# Patient Record
Sex: Female | Born: 1955 | Race: Black or African American | Hispanic: No | Smoking: Never smoker
Health system: Southern US, Community
[De-identification: ages and names within clinical notes are randomized; demographics above are authoritative.]

## PROBLEM LIST (undated history)

## (undated) DIAGNOSIS — E119 Type 2 diabetes mellitus without complications: Secondary | ICD-10-CM

## (undated) DIAGNOSIS — I10 Essential (primary) hypertension: Secondary | ICD-10-CM

## (undated) DIAGNOSIS — M1990 Unspecified osteoarthritis, unspecified site: Secondary | ICD-10-CM

## (undated) HISTORY — PX: TUBAL LIGATION: SHX77

## (undated) HISTORY — PX: UTERINE FIBROID SURGERY: SHX826

---

## 2010-04-20 ENCOUNTER — Ambulatory Visit: Payer: Self-pay | Admitting: Family Medicine

## 2010-04-20 IMAGING — CR DG KNEE COMPLETE 4+V*L*
1 series · 6 of 6 positions shown · non-contrast
Comparison: none

REASON FOR EXAM: arthritis
COMMENTS:

[Series 1: view not recorded · 0.17mm/px · 6 of 6 slices shown]
[im 1/6]
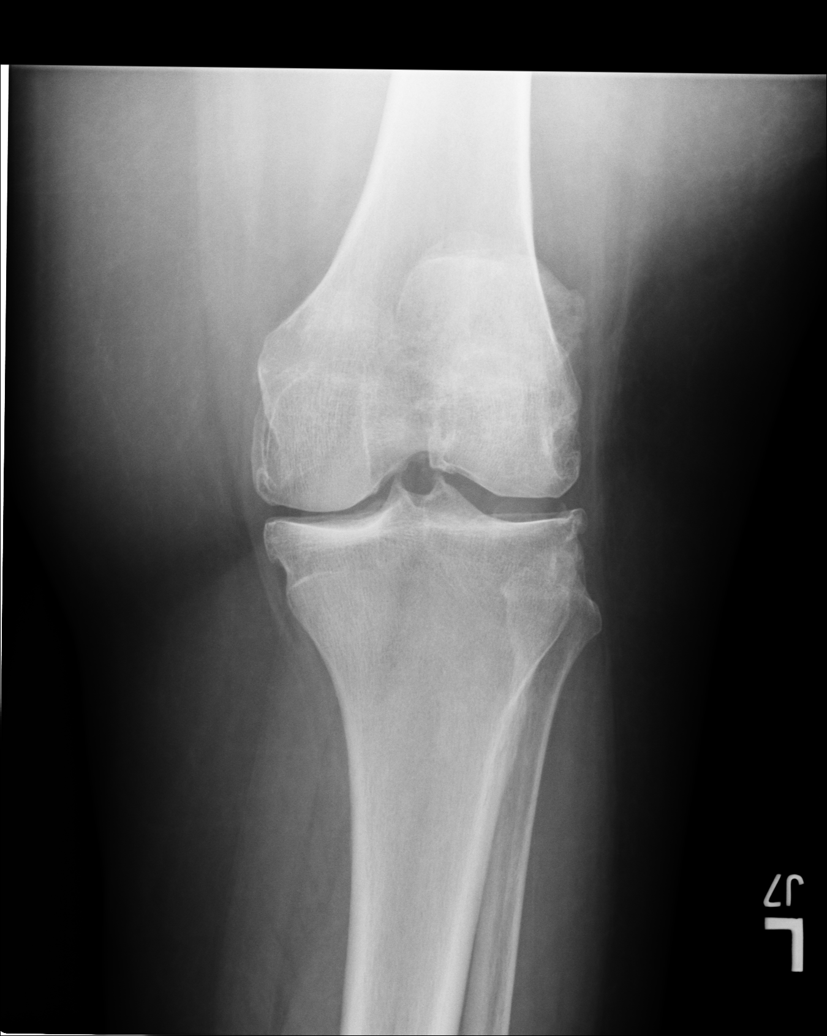
[im 2/6]
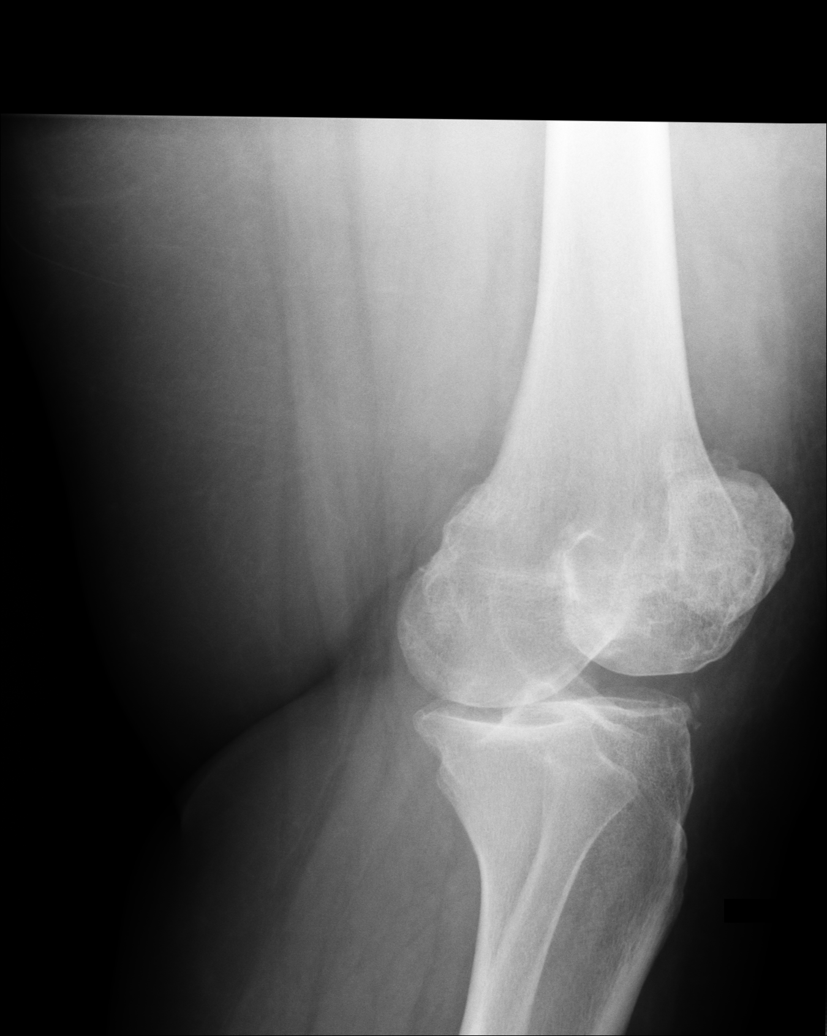
[im 3/6]
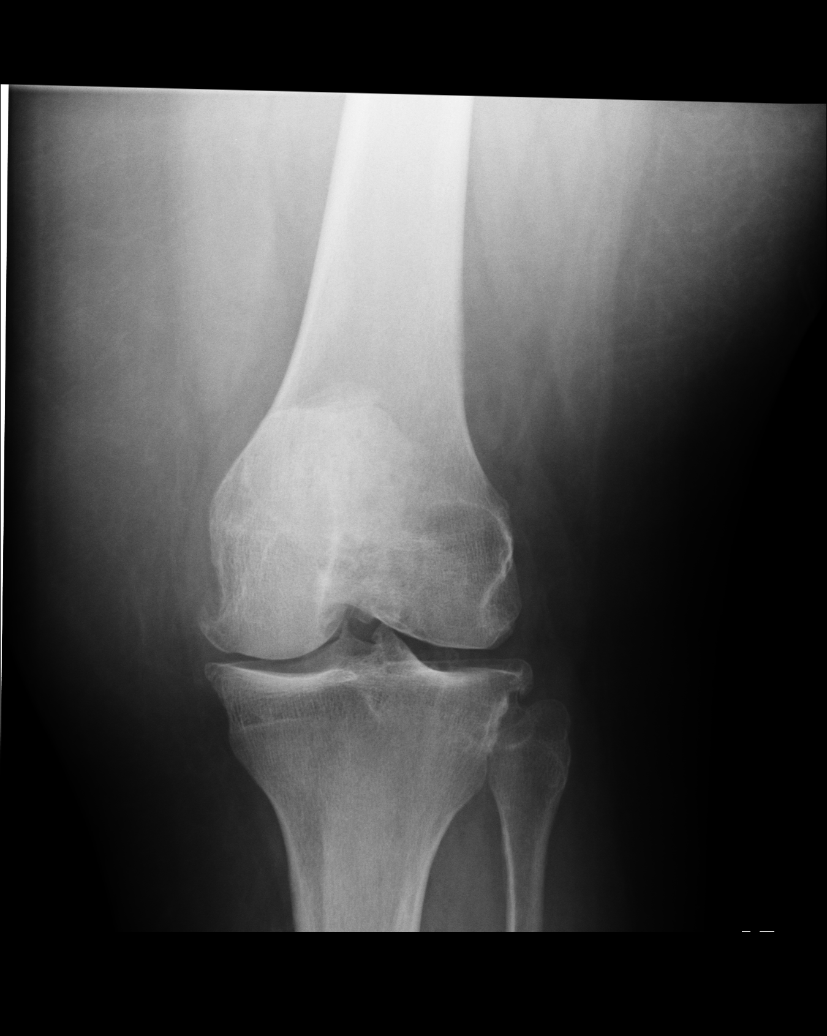
[im 4/6]
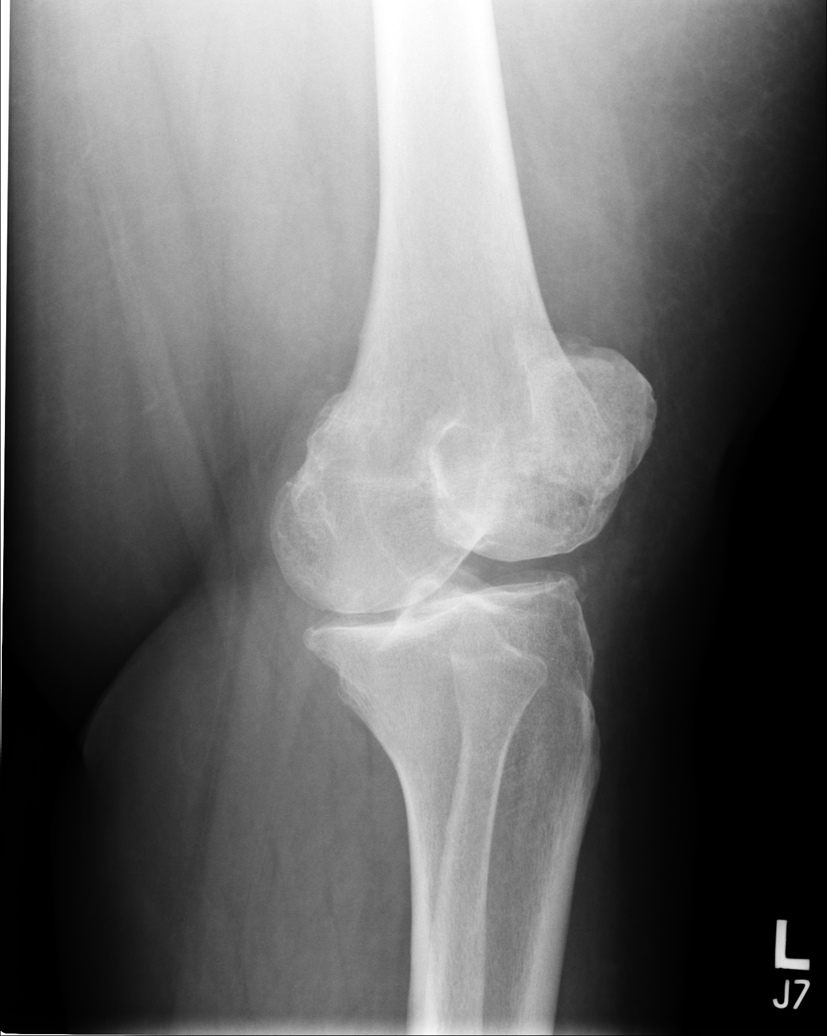
[im 5/6]
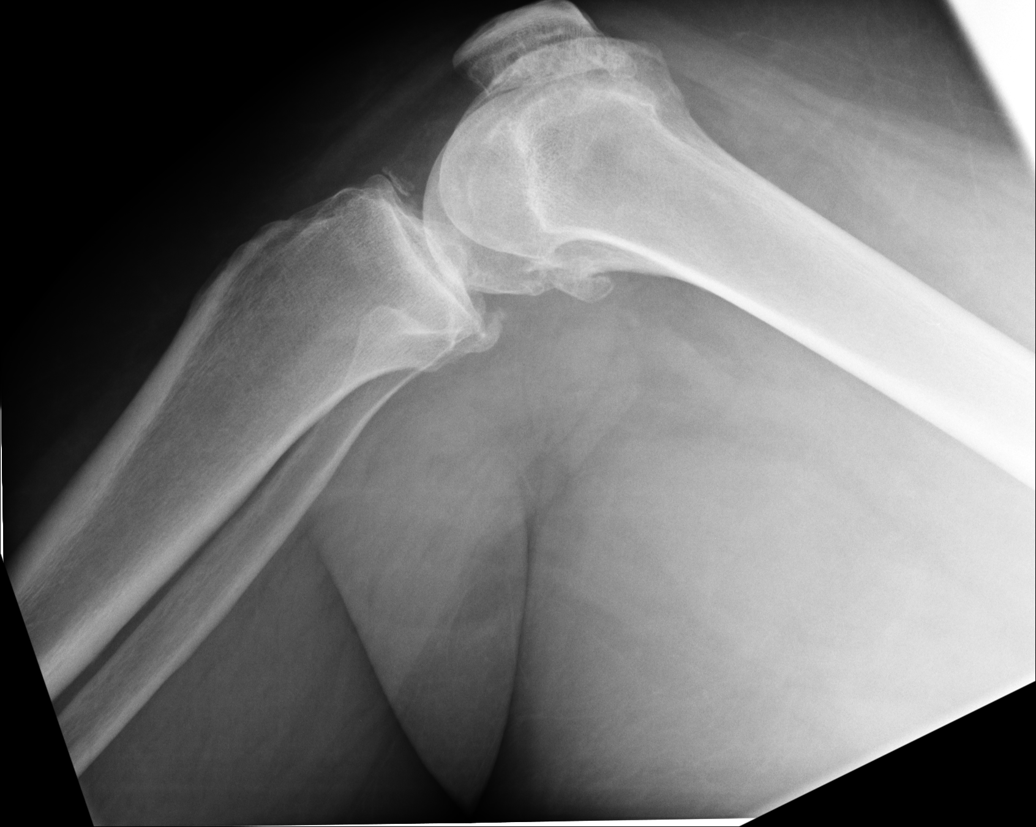
[im 6/6]
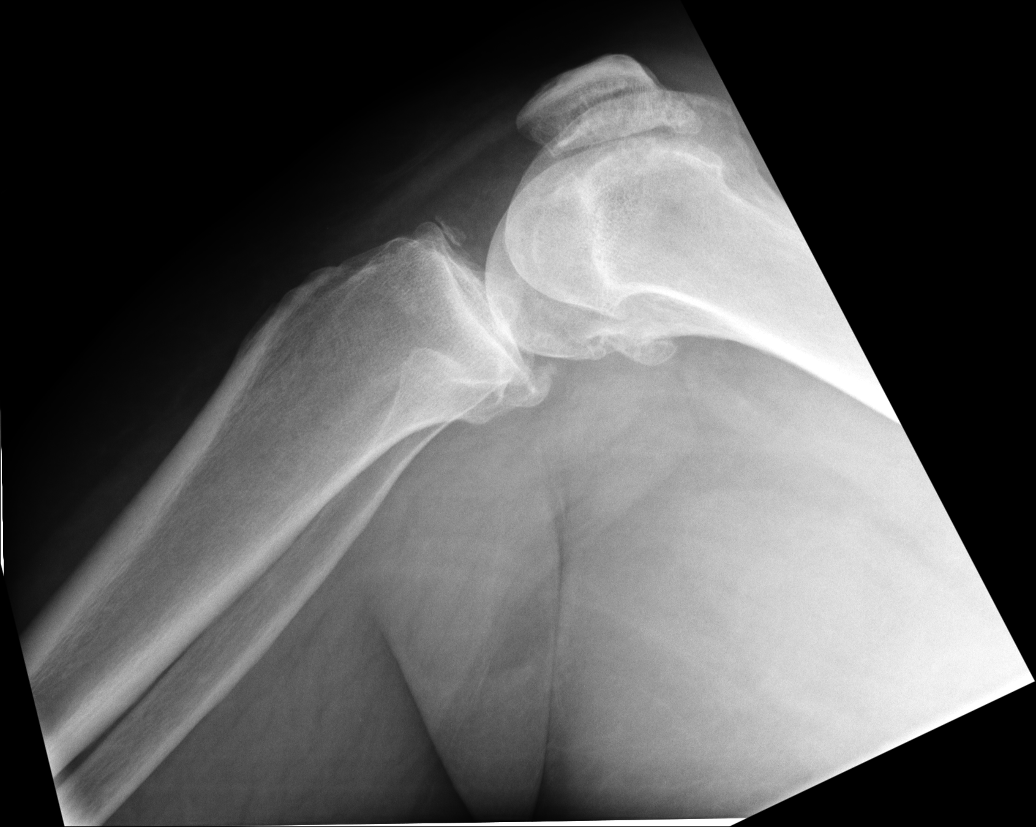

[6 of 6 positions shown; findings below may reference images not displayed]

PROCEDURE:     DXR - DXR KNEE LT COMP WITH OBLIQUES  - [DATE]  [DATE]

RESULT:     No fracture, dislocation or other acute bony abnormality is
seen. There is hypertrophic spurring about the knee compatible with
osteoarthritic change. The knee joint space is mildly narrowed medially,
also consistent with arthritic change. No cystic changes in the distal femur
or proximal tibia are identified. There is narrowing of the femoropatellar
joint space compatible with arthritic change. No fracture of the patella is
seen. No lytic or blastic changes about the knee are noted.
IMPRESSION: 1.  Arthritic changes are noted about the knee and femoropatellar joint as
noted above.
2.  No fracture is seen.
3.  No lytic or blastic lesions are noted.

## 2010-04-20 IMAGING — CR DG CHEST 2V
1 series · 2 of 2 positions shown · non-contrast
Comparison: none

REASON FOR EXAM: cough
COMMENTS:

[Series 1: view not recorded · 0.17mm/px · 2 of 2 slices shown]
[im 1/2]
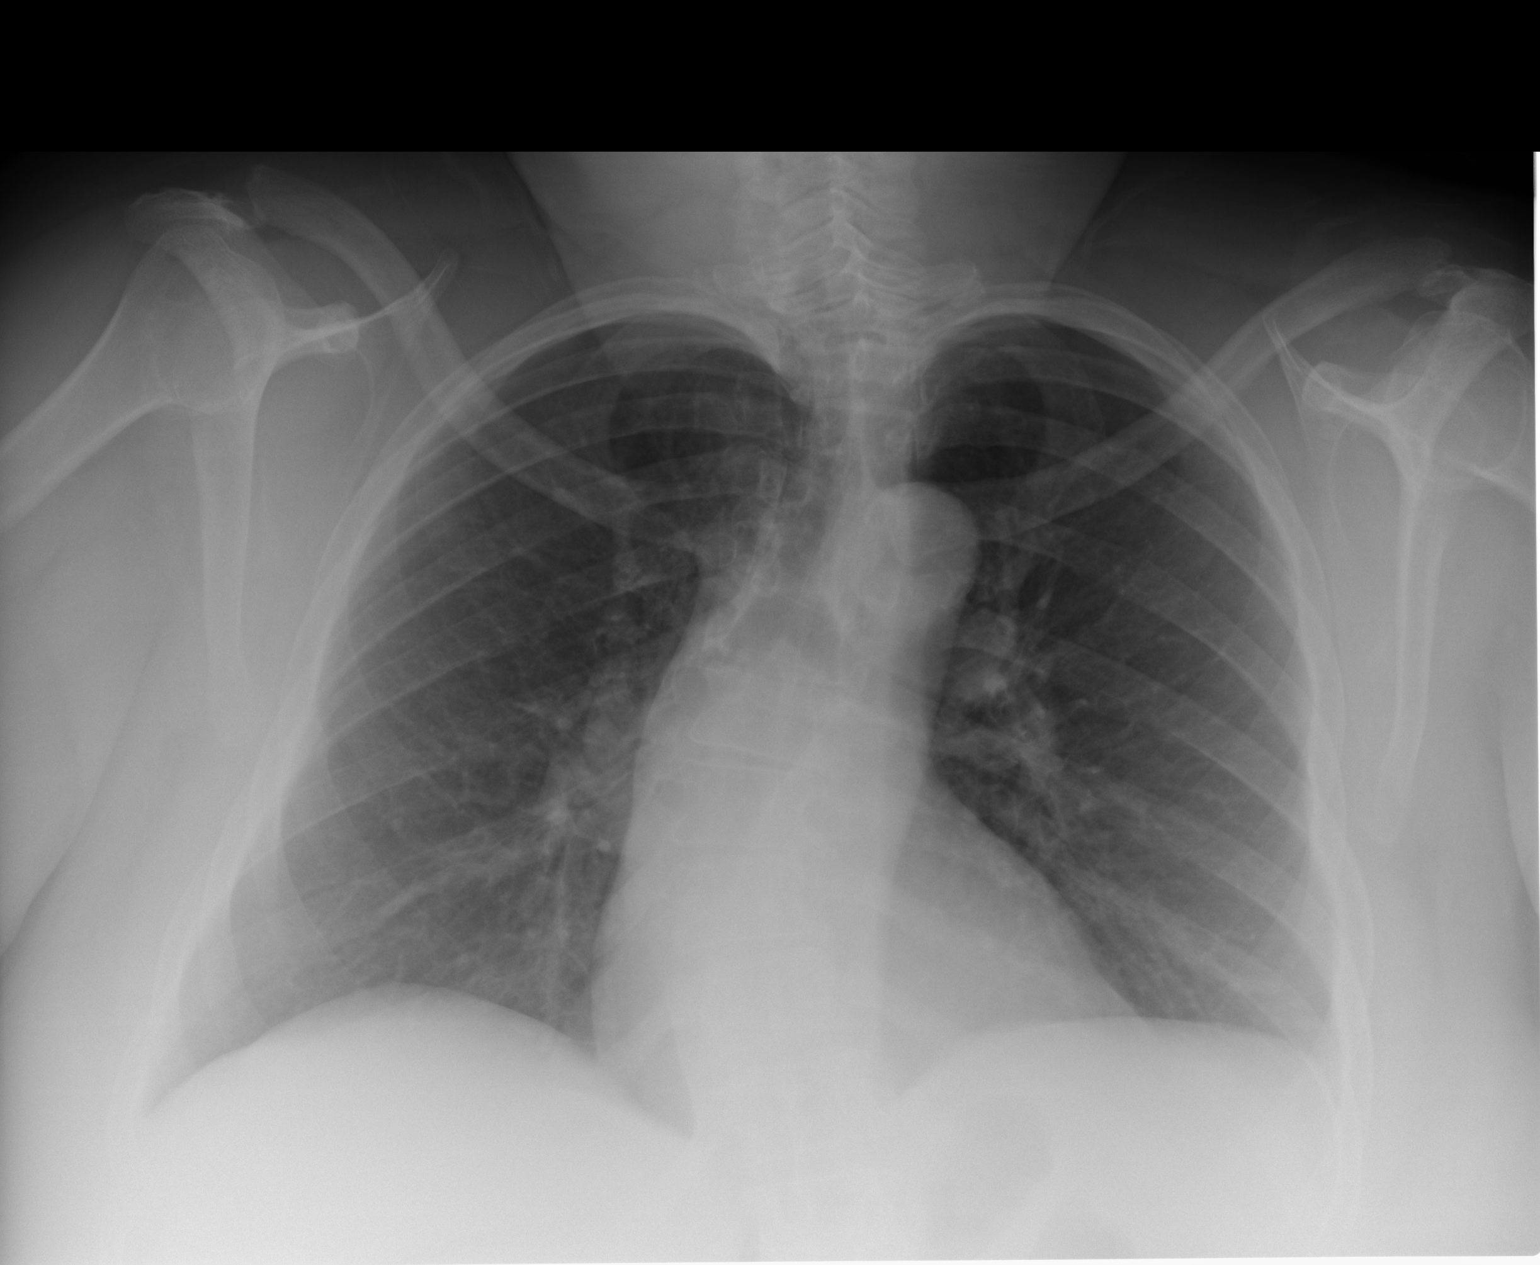
[im 2/2]
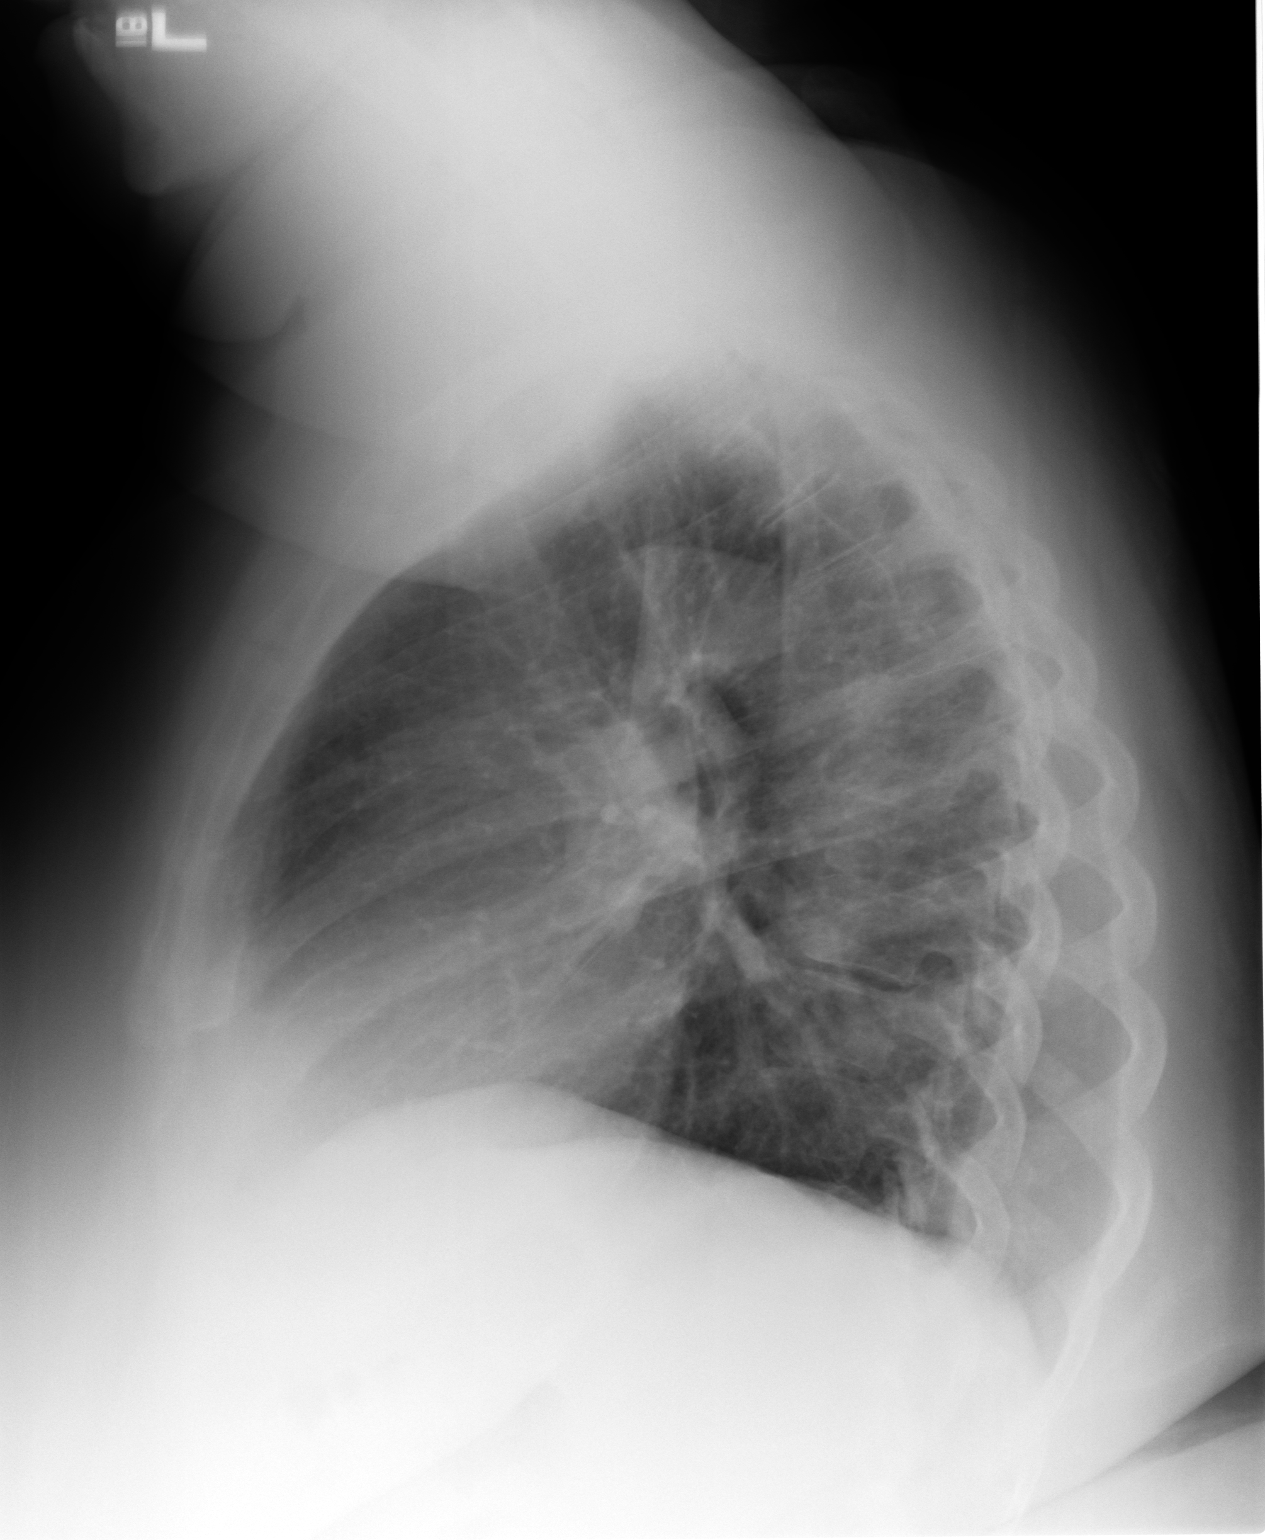

[2 of 2 positions shown; findings below may reference images not displayed]

PROCEDURE:     DXR - DXR CHEST PA (OR AP) AND LATERAL  - [DATE]  [DATE]

RESULT:     The lung fields are clear. The heart size is normal. The
pulmonary vasculature likewise is normal in appearance. There is a mild
thoracic scoliosis with convexly to the right. The osseous structures
otherwise are normal in appearance.
IMPRESSION: 1.  No acute changes are identified.
2.  The lung fields are clear.
3.  A thoracolumbar scoliosis is noted with the convexity being to the right
and measuring approximately 29 degrees.

## 2010-12-22 ENCOUNTER — Emergency Department: Payer: Self-pay | Admitting: Unknown Physician Specialty

## 2010-12-22 IMAGING — CR DG CHEST 1V PORT
1 series · 1 of 1 positions shown · non-contrast
Comparison: none

REASON FOR EXAM: cp palpitations
COMMENTS:

PROCEDURE:     DXR - DXR PORTABLE CHEST SINGLE VIEW  - [DATE]  [DATE]
RESULT:     Comparison is made to a prior exam of [DATE]. The lung fields
are clear. The heart, mediastinal and osseous structures show no significant
abnormalities. Monitoring electrodes are present.

[view not recorded]
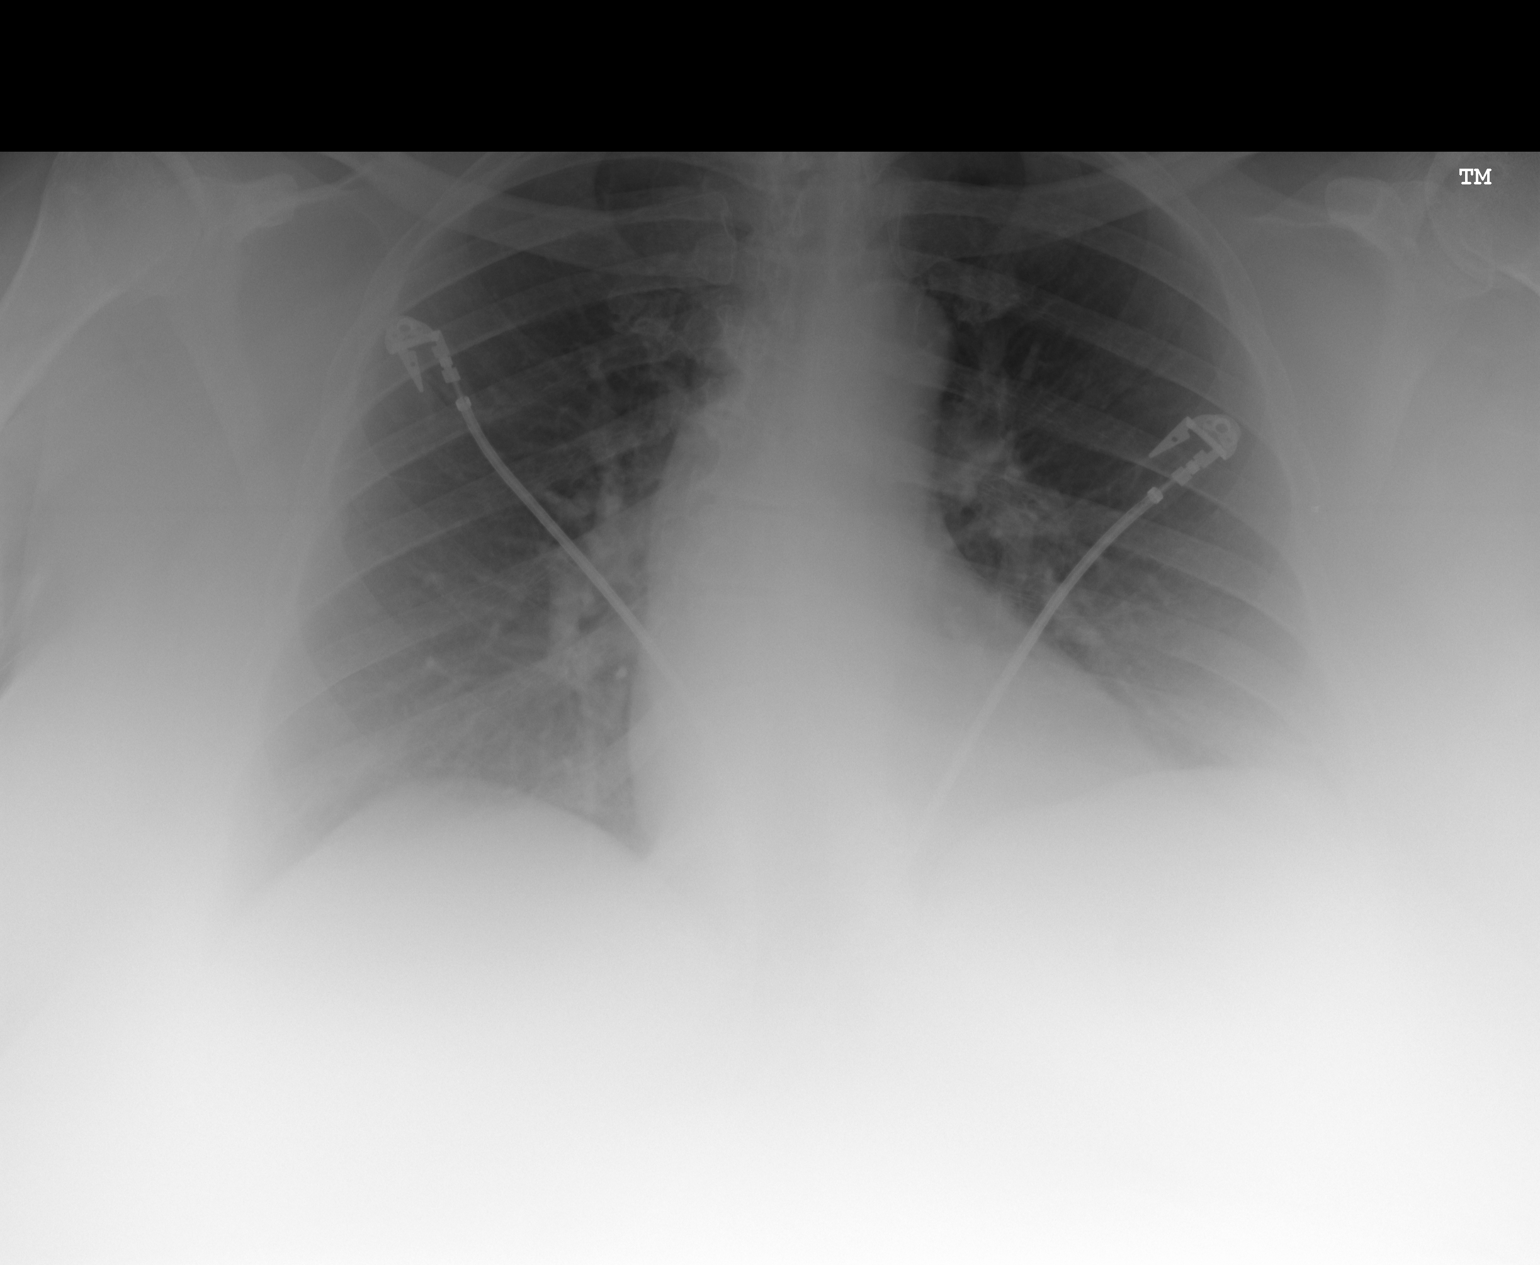

[1 of 1 positions shown; findings below may reference images not displayed]

IMPRESSION: 1.     No acute changes are identified.

## 2011-10-27 ENCOUNTER — Emergency Department: Payer: Self-pay | Admitting: Emergency Medicine

## 2016-03-07 ENCOUNTER — Ambulatory Visit: Payer: Self-pay

## 2016-03-21 ENCOUNTER — Ambulatory Visit: Payer: Self-pay | Attending: Internal Medicine

## 2016-08-01 ENCOUNTER — Emergency Department
Admission: EM | Admit: 2016-08-01 | Discharge: 2016-08-01 | Disposition: A | Discharge status: Home / Self Care | Payer: Medicaid Other | Attending: Emergency Medicine | Admitting: Emergency Medicine

## 2016-08-01 ENCOUNTER — Encounter: Payer: Self-pay | Admitting: Emergency Medicine

## 2016-08-01 DIAGNOSIS — T783XXA Angioneurotic edema, initial encounter: Secondary | ICD-10-CM | POA: Insufficient documentation

## 2016-08-01 DIAGNOSIS — E119 Type 2 diabetes mellitus without complications: Secondary | ICD-10-CM | POA: Diagnosis not present

## 2016-08-01 DIAGNOSIS — I10 Essential (primary) hypertension: Secondary | ICD-10-CM | POA: Diagnosis not present

## 2016-08-01 DIAGNOSIS — R220 Localized swelling, mass and lump, head: Secondary | ICD-10-CM | POA: Diagnosis present

## 2016-08-01 HISTORY — DX: Type 2 diabetes mellitus without complications: E11.9

## 2016-08-01 HISTORY — DX: Essential (primary) hypertension: I10

## 2016-08-01 LAB — CBC WITH DIFFERENTIAL/PLATELET
BASOS PCT: 1 %
Basophils Absolute: 0.1 10*3/uL (ref 0–0.1)
Eosinophils Absolute: 0.4 10*3/uL (ref 0–0.7)
Eosinophils Relative: 3 %
HEMATOCRIT: 39.6 % (ref 35.0–47.0)
HEMOGLOBIN: 12.9 g/dL (ref 12.0–16.0)
LYMPHS ABS: 2.7 10*3/uL (ref 1.0–3.6)
Lymphocytes Relative: 23 %
MCH: 25.7 pg — AB (ref 26.0–34.0)
MCHC: 32.7 g/dL (ref 32.0–36.0)
MCV: 78.4 fL — AB (ref 80.0–100.0)
MONO ABS: 1 10*3/uL — AB (ref 0.2–0.9)
MONOS PCT: 9 %
NEUTROS ABS: 7.5 10*3/uL — AB (ref 1.4–6.5)
Neutrophils Relative %: 64 %
Platelets: 264 10*3/uL (ref 150–440)
RBC: 5.04 MIL/uL (ref 3.80–5.20)
RDW: 15.3 % — AB (ref 11.5–14.5)
WBC: 11.7 10*3/uL — ABNORMAL HIGH (ref 3.6–11.0)

## 2016-08-01 LAB — COMPREHENSIVE METABOLIC PANEL
ALBUMIN: 3.6 g/dL (ref 3.5–5.0)
ALK PHOS: 96 U/L (ref 38–126)
ALT: 26 U/L (ref 14–54)
ANION GAP: 8 (ref 5–15)
AST: 28 U/L (ref 15–41)
BILIRUBIN TOTAL: 0.7 mg/dL (ref 0.3–1.2)
BUN: 20 mg/dL (ref 6–20)
CALCIUM: 9.4 mg/dL (ref 8.9–10.3)
CO2: 25 mmol/L (ref 22–32)
Chloride: 105 mmol/L (ref 101–111)
Creatinine, Ser: 1.12 mg/dL — ABNORMAL HIGH (ref 0.44–1.00)
GFR, EST NON AFRICAN AMERICAN: 52 mL/min — AB (ref >60)
Glucose, Bld: 129 mg/dL — ABNORMAL HIGH (ref 65–99)
POTASSIUM: 4.6 mmol/L (ref 3.5–5.1)
Sodium: 138 mmol/L (ref 135–145)
TOTAL PROTEIN: 7.9 g/dL (ref 6.5–8.1)

## 2016-08-01 MED ORDER — FAMOTIDINE IN NACL 20-0.9 MG/50ML-% IV SOLN
20.0000 mg | Freq: Once | INTRAVENOUS | Status: AC
  Administered 2016-08-01: 20 mg via INTRAVENOUS
  Filled 2016-08-01: qty 50

## 2016-08-01 MED ORDER — PREDNISONE 10 MG (21) PO TBPK: ORAL_TABLET | ORAL | 0 refills | Status: DC

## 2016-08-01 MED ORDER — METHYLPREDNISOLONE SODIUM SUCC 125 MG IJ SOLR
125.0000 mg | Freq: Once | INTRAMUSCULAR | Status: AC
  Administered 2016-08-01: 125 mg via INTRAVENOUS
  Filled 2016-08-01: qty 2

## 2016-08-01 MED ORDER — DIPHENHYDRAMINE HCL 25 MG PO CAPS: 25.0000 mg | ORAL_CAPSULE | ORAL | 2 refills | Status: DC | PRN

## 2016-08-01 MED ORDER — DIPHENHYDRAMINE HCL 50 MG/ML IJ SOLN
50.0000 mg | Freq: Once | INTRAMUSCULAR | Status: AC
  Administered 2016-08-01: 50 mg via INTRAVENOUS
  Filled 2016-08-01: qty 1

## 2016-08-01 NOTE — ED Provider Notes (Signed)
[facility] Emergency Department Provider Note        Time seen: ----------------------------------------- 9:57 AM on 08/01/2016 -----------------------------------------    I have reviewed the triage vital signs and the nursing notes.   HISTORY  Chief Complaint Allergic Reaction    HPI Chelsea Glass is a 60 y.o. female who presents to the ER for tongue swelling. Patient states she woke up this morning and noticed her tongue was swollen on the left side and it has progressed slightly. Patient states she's having some trouble talking but not swallowing at this time. She denies any changes in her medicines. She denies recent illness or other complaints. She's never had this problem before.   Past Medical History:  Diagnosis Date  . Diabetes mellitus without complication (HCC)   . Hypertension     There are no active problems to display for this patient.   Past Surgical History:  Procedure Laterality Date  . UTERINE FIBROID SURGERY      Allergies Review of patient's allergies indicates no known allergies.  Social History Social History  Substance Use Topics  . Smoking status: Not on file  . Smokeless tobacco: Not on file  . Alcohol use Not on file    Review of Systems Constitutional: Negative for fever. ENT: Positive for tongue swelling Cardiovascular: Negative for chest pain. Respiratory: Negative for shortness of breath. Gastrointestinal: Negative for abdominal pain, vomiting and diarrhea. Genitourinary: Negative for dysuria. Musculoskeletal: Negative for back pain. Skin: Negative for rash. Neurological: Negative for headaches, focal weakness or numbness.  10-point ROS otherwise negative.  ____________________________________________   PHYSICAL EXAM:  VITAL SIGNS: ED Triage Vitals  Enc Vitals Group     BP 08/01/16 0938 (!) 159/95     Pulse Rate 08/01/16 0938 100     Resp 08/01/16 0938 18     Temp 08/01/16 0938 98 F  (36.7 C)     Temp Source 08/01/16 0938 Oral     SpO2 08/01/16 0938 98 %     Weight 08/01/16 0939 (!) 323 lb (146.5 kg)     Height 08/01/16 0939 5\' 6"  (1.676 m)     Head Circumference --      Peak Flow --      Pain Score --      Pain Loc --      Pain Edu? --      Excl. in GC? --     Constitutional: Alert and oriented. Well appearing and in no distress. Eyes: Conjunctivae are normal. PERRL. Normal extraocular movements. ENT   Head: Normocephalic and atraumatic.   Nose: No congestion/rhinnorhea.   Mouth/Throat: Mucous membranes are moist.Mild to moderate tongue swelling is noted, particularly inferiorly. No posterior swelling or posterior pharyngeal edema   Neck: No stridor. Cardiovascular: Normal rate, regular rhythm. No murmurs, rubs, or gallops. Respiratory: Normal respiratory effort without tachypnea nor retractions. Breath sounds are clear and equal bilaterally. No wheezes/rales/rhonchi. Musculoskeletal: Nontender with normal range of motion in all extremities. No lower extremity tenderness nor edema. Neurologic:  Normal speech and language. No gross focal neurologic deficits are appreciated.  Skin:  Skin is warm, dry and intact. No rash noted. Psychiatric: Mood and affect are normal. Speech and behavior are normal.  ____________________________________________  ED COURSE:  Pertinent labs & imaging results that were available during my care of the patient were reviewed by me and considered in my medical decision making (see chart for details). Clinical Course  Patient presents to the ER with likely angioedema. Patient  reports she takes lisinopril which is likely the culprit. We will give IV steroids and antihistamines and observed.  Procedures ____________________________________________   LABS (pertinent positives/negatives)  Labs Reviewed  CBC WITH DIFFERENTIAL/PLATELET - Abnormal; Notable for the following:       Result Value   WBC 11.7 (*)    MCV 78.4 (*)     MCH 25.7 (*)    RDW 15.3 (*)    Neutro Abs 7.5 (*)    Monocytes Absolute 1.0 (*)    All other components within normal limits  COMPREHENSIVE METABOLIC PANEL - Abnormal; Notable for the following:    Glucose, Bld 129 (*)    Creatinine, Ser 1.12 (*)    GFR calc non Af Amer 52 (*)    All other components within normal limits  ____________________________________________  FINAL ASSESSMENT AND PLAN  Angioedema  Plan: Patient with labs as dictated above. Patient has been observed in the ER for several hours without worsening of her symptoms. Her tongue is feeling better. I've advised her to stop her ACE inhibitor or ARB and have close outpatient follow-up with her doctor for recheck.   Emily FilbertWilliams, Annaston Upham E, MD   Note: This dictation was prepared with Dragon dictation. Any transcriptional errors that result from this process are unintentional    Emily FilbertJonathan E Yohann Curl, MD 08/01/16 1243

## 2016-08-01 NOTE — ED Triage Notes (Signed)
Pt arrived via EMS from home for reports of swollen tongue. No respiratory distress noted. Pt denies any new medications. EMS reports 164/100, 98%, NSR, CBG 145.

## 2016-09-22 ENCOUNTER — Emergency Department
Admission: EM | Admit: 2016-09-22 | Discharge: 2016-09-22 | Disposition: A | Discharge status: Home / Self Care | Payer: Medicaid Other | Attending: Emergency Medicine | Admitting: Emergency Medicine

## 2016-09-22 DIAGNOSIS — E119 Type 2 diabetes mellitus without complications: Secondary | ICD-10-CM | POA: Diagnosis not present

## 2016-09-22 DIAGNOSIS — R0600 Dyspnea, unspecified: Secondary | ICD-10-CM | POA: Diagnosis present

## 2016-09-22 DIAGNOSIS — T783XXA Angioneurotic edema, initial encounter: Secondary | ICD-10-CM | POA: Diagnosis not present

## 2016-09-22 DIAGNOSIS — Z79899 Other long term (current) drug therapy: Secondary | ICD-10-CM | POA: Insufficient documentation

## 2016-09-22 DIAGNOSIS — I10 Essential (primary) hypertension: Secondary | ICD-10-CM | POA: Insufficient documentation

## 2016-09-22 HISTORY — DX: Unspecified osteoarthritis, unspecified site: M19.90

## 2016-09-22 MED ORDER — FAMOTIDINE IN NACL 20-0.9 MG/50ML-% IV SOLN
20.0000 mg | Freq: Once | INTRAVENOUS | Status: AC
  Administered 2016-09-22: 20 mg via INTRAVENOUS

## 2016-09-22 MED ORDER — CARVEDILOL 12.5 MG PO TABS: 12.5000 mg | ORAL_TABLET | Freq: Two times a day (BID) | ORAL | 0 refills | Status: DC

## 2016-09-22 MED ORDER — METHYLPREDNISOLONE SODIUM SUCC 125 MG IJ SOLR
INTRAMUSCULAR | Status: AC
  Filled 2016-09-22: qty 2

## 2016-09-22 MED ORDER — DIPHENHYDRAMINE HCL 50 MG/ML IJ SOLN
50.0000 mg | Freq: Once | INTRAMUSCULAR | Status: AC
  Administered 2016-09-22: 50 mg via INTRAVENOUS

## 2016-09-22 MED ORDER — DIPHENHYDRAMINE HCL 50 MG/ML IJ SOLN
INTRAMUSCULAR | Status: AC
  Filled 2016-09-22: qty 1

## 2016-09-22 MED ORDER — METHYLPREDNISOLONE SODIUM SUCC 125 MG IJ SOLR
125.0000 mg | Freq: Once | INTRAMUSCULAR | Status: AC
  Administered 2016-09-22: 125 mg via INTRAVENOUS

## 2016-09-22 NOTE — ED Triage Notes (Signed)
Pt taken to rm 12. Reports 1 hour of swelling of her tongue and shortness of breath.

## 2016-09-22 NOTE — ED Notes (Addendum)
Pt ambulatory to stat desk, appears SOB, reports tongue swelling and SOB, states it has happened before caused by lisinopril but she was taken off lisinopril so unsure of cause.  Pt taken directly to rm 12.  Primary nurse informed

## 2016-09-22 NOTE — ED Notes (Signed)
Reviewed d/c instructions, follow-up care and prescription with patient. Pt verbalized understanding.  

## 2016-09-22 NOTE — ED Notes (Signed)
Patient reports decrease in SOB

## 2016-09-22 NOTE — ED Provider Notes (Signed)
[facility] Emergency Department Provider Note   First MD Initiated Contact with Patient 09/22/16 0206     (approximate)  I have reviewed the triage vital signs and the nursing notes.   HISTORY  Chief Complaint Oral Swelling    HPI Chelsea Glass is a 60 y.o. female with bolus of chronic medical conditions presents to the emergency department with tongue swelling and dyspnea with onset 1 hour before presentation. Patient states that this has occurred once before and was attributed to ACE inhibitor (lisinopril) which was discontinued and losartan was started. Patient denies any chest pain.   Past Medical History:  Diagnosis Date  . Arthritis   . Diabetes mellitus without complication (HCC)   . Hypertension     There are no active problems to display for this patient.   Past Surgical History:  Procedure Laterality Date  . UTERINE FIBROID SURGERY      Prior to Admission medications   Medication Sig Start Date End Date Taking? Authorizing Provider  diphenhydrAMINE (BENADRYL) 25 mg capsule Take 1 capsule (25 mg total) by mouth every 4 (four) hours as needed. 08/01/16 08/01/17  Emily FilbertJonathan E Williams, MD  predniSONE (STERAPRED UNI-PAK 21 TAB) 10 MG (21) TBPK tablet Dispense steroid taper pack as directed 08/01/16   Emily FilbertJonathan E Williams, MD    Allergies Lisinopril  No family history on file.  Social History Social History  Substance Use Topics  . Smoking status: Not on file  . Smokeless tobacco: Not on file  . Alcohol use Not on file    Review of Systems Constitutional: No fever/chills Eyes: No visual changes. ENT: No sore throat.Positive for tongue swelling Cardiovascular: Denies chest pain. Respiratory: Denies shortness of breath. Gastrointestinal: No abdominal pain.  No nausea, no vomiting.  No diarrhea.  No constipation. Genitourinary: Negative for dysuria. Musculoskeletal: Negative for back pain. Skin: Negative for  rash. Neurological: Negative for headaches, focal weakness or numbness.  10-point ROS otherwise negative.  ____________________________________________   PHYSICAL EXAM:  VITAL SIGNS: ED Triage Vitals  Enc Vitals Group     BP 09/22/16 0208 (!) 179/114     Pulse Rate 09/22/16 0212 (!) 102     Resp 09/22/16 0212 18     Temp 09/22/16 0212 98.3 F (36.8 C)     Temp Source 09/22/16 0212 Oral     SpO2 09/22/16 0212 100 %     Weight 09/22/16 0208 (!) 325 lb (147.4 kg)     Height 09/22/16 0208 5\' 6"  (1.676 m)     Head Circumference --      Peak Flow --      Pain Score 09/22/16 0209 8     Pain Loc --      Pain Edu? --      Excl. in GC? --     Constitutional: Alert and oriented. Well appearing and in no acute distress. Eyes: Conjunctivae are normal. PERRL. EOMI. Head: Atraumatic. Ears:  Healthy appearing ear canals and TMs bilaterally Nose: No congestion/rhinnorhea. Mouth/Throat: Mucous membranes are moist.  Oropharynx non-erythematous.Mallampati 1. No appreciable tongue swelling Neck: No stridor.   Cardiovascular: Normal rate, regular rhythm. Good peripheral circulation. Grossly normal heart sounds. Respiratory: Normal respiratory effort.  No retractions. Lungs CTAB. Gastrointestinal: Soft and nontender. No distention.  Musculoskeletal: No lower extremity tenderness nor edema. No gross deformities of extremities. Neurologic:  Normal speech and language. No gross focal neurologic deficits are appreciated.  Skin:  Skin is warm, dry and intact. No rash noted.  Psychiatric: Mood and affect are normal. Speech and behavior are normal.*    Procedures    INITIAL IMPRESSION / ASSESSMENT AND PLAN / ED COURSE  Pertinent labs & imaging results that were available during my care of the patient were reviewed by me and considered in my medical decision making (see chart for details).  Patient given cimetidine 125 Pepcid 20 mg and Benadryl 50 mg with resolution of symptoms. Patient  advised to discontinue taking losartan HCTZ. Patient prescribe Coreg and advised to follow-up with primary care provider.    Clinical Course     ____________________________________________  FINAL CLINICAL IMPRESSION(S) / ED DIAGNOSES  Final diagnoses:  Angioedema, initial encounter     MEDICATIONS GIVEN DURING THIS VISIT:  Medications  famotidine (PEPCID) IVPB 20 mg premix (0 mg Intravenous Stopped 09/22/16 0247)  methylPREDNISolone sodium succinate (SOLU-MEDROL) 125 mg/2 mL injection 125 mg (125 mg Intravenous Given 09/22/16 0216)  diphenhydrAMINE (BENADRYL) injection 50 mg (50 mg Intravenous Given 09/22/16 0216)     NEW OUTPATIENT MEDICATIONS STARTED DURING THIS VISIT:  New Prescriptions   No medications on file    Modified Medications   No medications on file    Discontinued Medications   No medications on file     Note:  This document was prepared using Dragon voice recognition software and may include unintentional dictation errors.    Darci Currentandolph N Amit Meloy, MD 09/22/16 [phone]

## 2017-02-22 ENCOUNTER — Emergency Department
Admission: EM | Admit: 2017-02-22 | Discharge: 2017-02-22 | Disposition: A | Discharge status: Home / Self Care | Payer: Medicaid Other | Attending: Emergency Medicine | Admitting: Emergency Medicine

## 2017-02-22 ENCOUNTER — Emergency Department: Payer: Medicaid Other

## 2017-02-22 ENCOUNTER — Encounter: Payer: Self-pay | Admitting: Emergency Medicine

## 2017-02-22 DIAGNOSIS — R42 Dizziness and giddiness: Secondary | ICD-10-CM | POA: Diagnosis not present

## 2017-02-22 DIAGNOSIS — E119 Type 2 diabetes mellitus without complications: Secondary | ICD-10-CM | POA: Diagnosis not present

## 2017-02-22 DIAGNOSIS — R1110 Vomiting, unspecified: Secondary | ICD-10-CM | POA: Diagnosis present

## 2017-02-22 DIAGNOSIS — Z79899 Other long term (current) drug therapy: Secondary | ICD-10-CM | POA: Insufficient documentation

## 2017-02-22 DIAGNOSIS — I10 Essential (primary) hypertension: Secondary | ICD-10-CM | POA: Diagnosis not present

## 2017-02-22 LAB — CBC
HCT: 41.3 % (ref 35.0–47.0)
Hemoglobin: 13.4 g/dL (ref 12.0–16.0)
MCH: 24.7 pg — AB (ref 26.0–34.0)
MCHC: 32.5 g/dL (ref 32.0–36.0)
MCV: 75.8 fL — AB (ref 80.0–100.0)
PLATELETS: 288 10*3/uL (ref 150–440)
RBC: 5.45 MIL/uL — AB (ref 3.80–5.20)
RDW: 15 % — ABNORMAL HIGH (ref 11.5–14.5)
WBC: 12.9 10*3/uL — ABNORMAL HIGH (ref 3.6–11.0)

## 2017-02-22 LAB — COMPREHENSIVE METABOLIC PANEL
ALBUMIN: 4 g/dL (ref 3.5–5.0)
ALT: 18 U/L (ref 14–54)
AST: 17 U/L (ref 15–41)
Alkaline Phosphatase: 96 U/L (ref 38–126)
Anion gap: 9 (ref 5–15)
BUN: 19 mg/dL (ref 6–20)
CHLORIDE: 103 mmol/L (ref 101–111)
CO2: 27 mmol/L (ref 22–32)
CREATININE: 0.94 mg/dL (ref 0.44–1.00)
Calcium: 9.8 mg/dL (ref 8.9–10.3)
GFR calc Af Amer: >60 mL/min (ref >60)
GFR calc non Af Amer: >60 mL/min (ref >60)
Glucose, Bld: 139 mg/dL — ABNORMAL HIGH (ref 65–99)
Potassium: 3.1 mmol/L — ABNORMAL LOW (ref 3.5–5.1)
Sodium: 139 mmol/L (ref 135–145)
Total Bilirubin: 0.4 mg/dL (ref 0.3–1.2)
Total Protein: 8.1 g/dL (ref 6.5–8.1)

## 2017-02-22 LAB — URINALYSIS, COMPLETE (UACMP) WITH MICROSCOPIC
BILIRUBIN URINE: NEGATIVE
Glucose, UA: NEGATIVE mg/dL
Hgb urine dipstick: NEGATIVE
KETONES UR: NEGATIVE mg/dL
Nitrite: NEGATIVE
PROTEIN: NEGATIVE mg/dL
SPECIFIC GRAVITY, URINE: 1.026 (ref 1.005–1.030)
pH: 6 (ref 5.0–8.0)

## 2017-02-22 LAB — LIPASE, BLOOD: LIPASE: 19 U/L (ref 11–51)

## 2017-02-22 IMAGING — CT CT HEAD W/O CM
3 series · 15 of 46 positions shown, 18 images · non-contrast
Comparison: None.

CLINICAL DATA: Vertigo. Pt reports vomiting this morning, denies
abdominal pain. Pt does have cough and starts to cough, then vomits.
Pt actively vomiting in triage.

EXAM:
CT HEAD WITHOUT CONTRAST
TECHNIQUE: Contiguous axial images were obtained from the base of the skull
through the vertex without intravenous contrast.

[Series 2: head wo · axial · 0.44mm/px · z∈[+422,+542]mm · 9 of 29 slices shown, 12 images]
[im 3/29  brain]
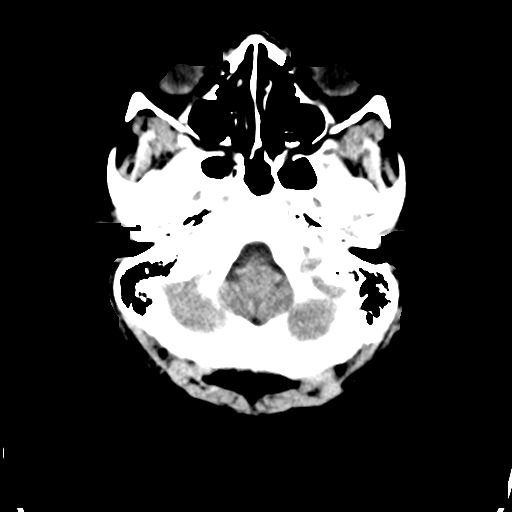
[im 3/29  bone]
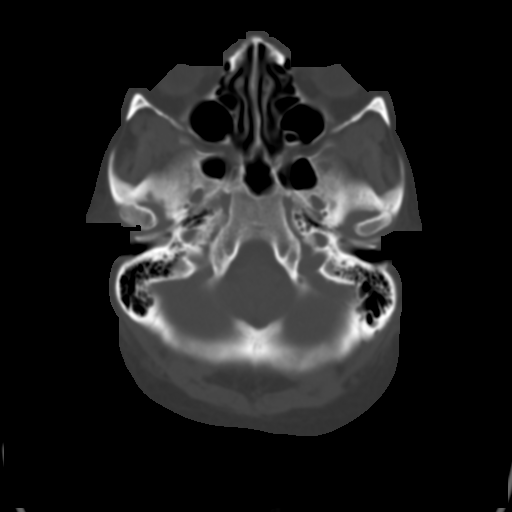
[im 6/29  brain]
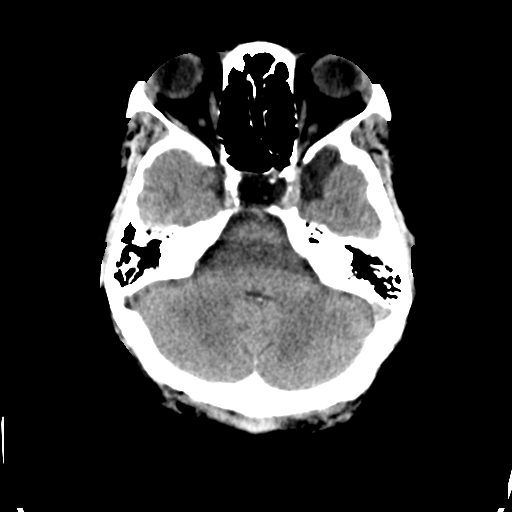
[im 9/29  brain]
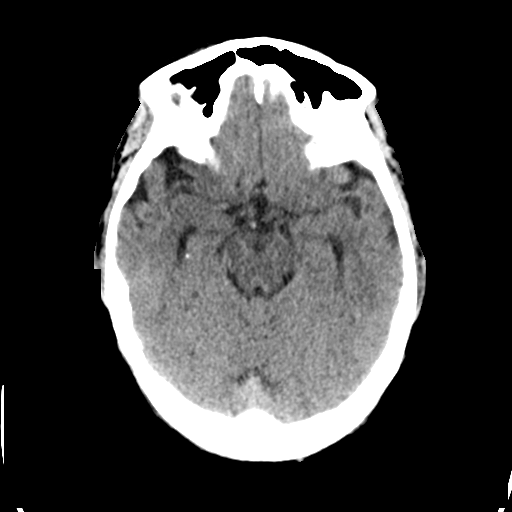
[im 12/29  brain]
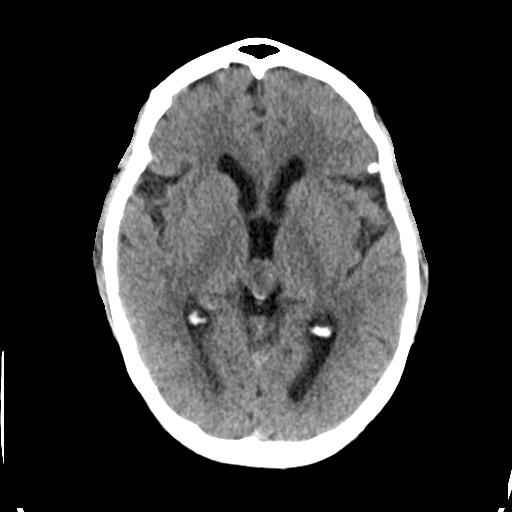
[im 15/29  brain]
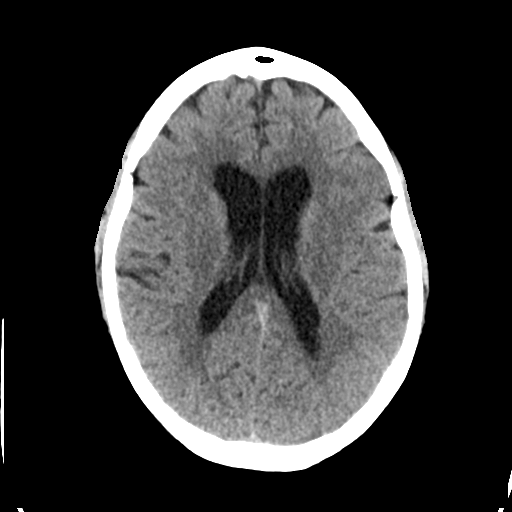
[im 15/29  bone]
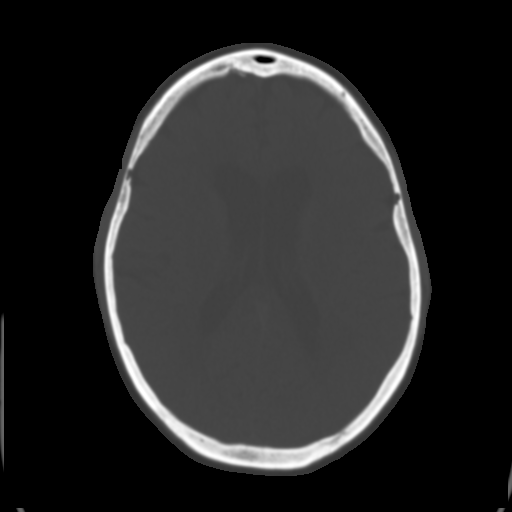
[im 18/29  brain]
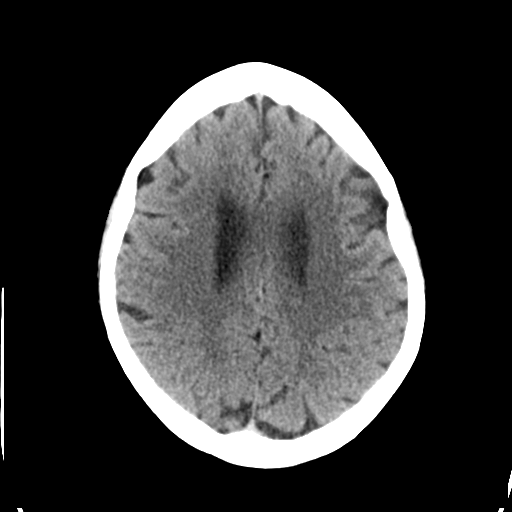
[im 21/29  brain]
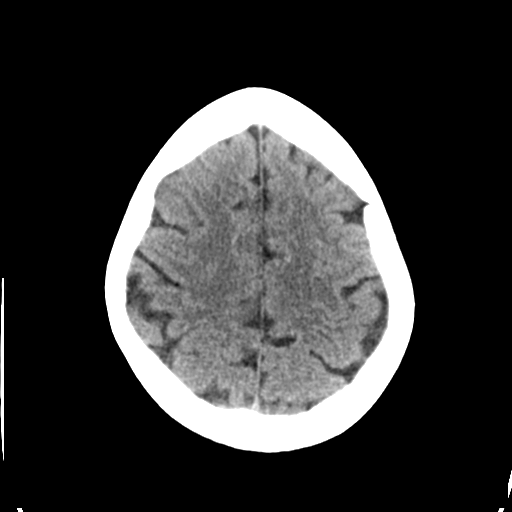
[im 24/29  brain]
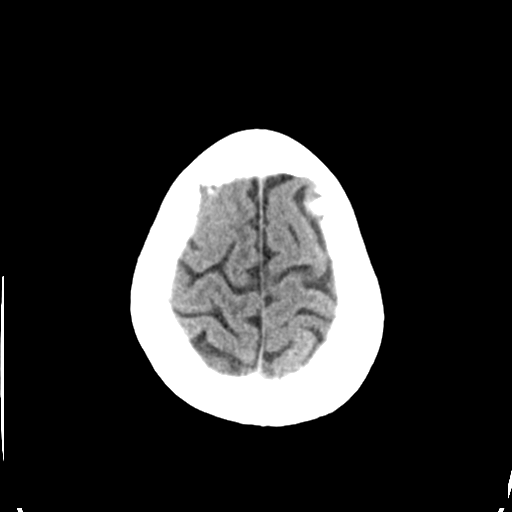
[im 27/29  brain]
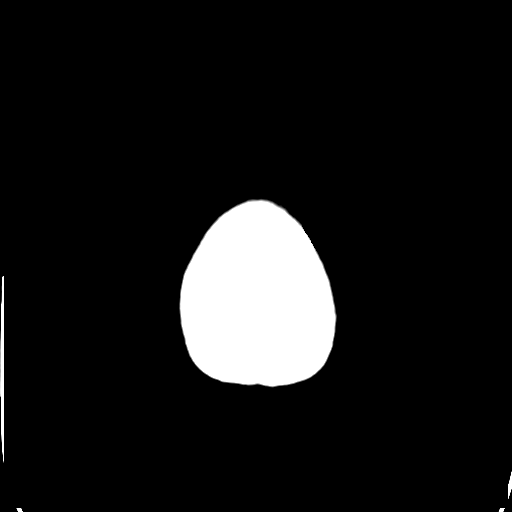
[im 27/29  bone]
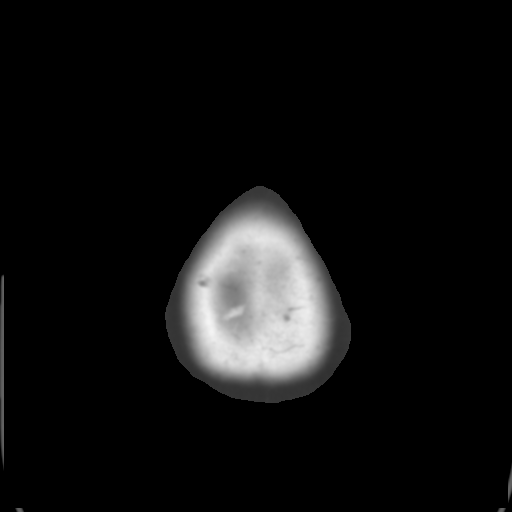

[Series 4: coronal soft tissue · coronal · 0.30mm/px · 3 of 65 slices shown]
[im 22/65  brain]
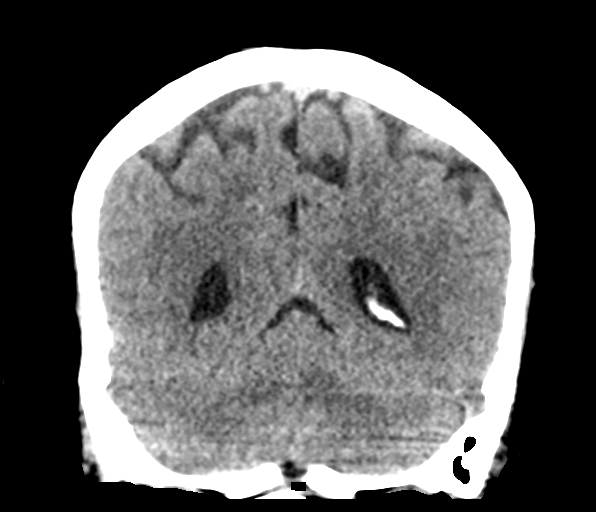
[im 29/65  brain]
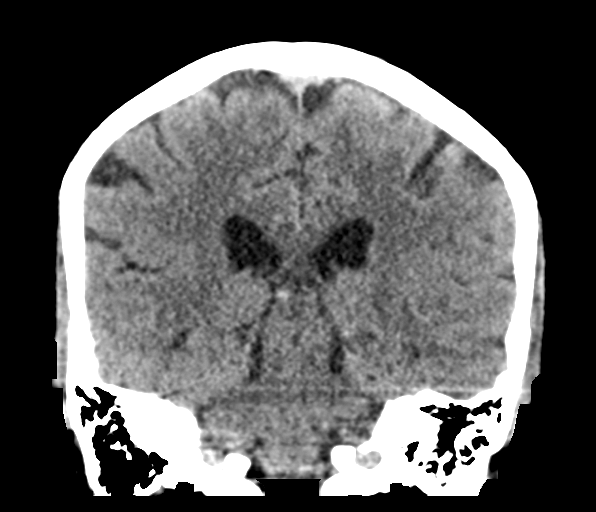
[im 36/65  brain]
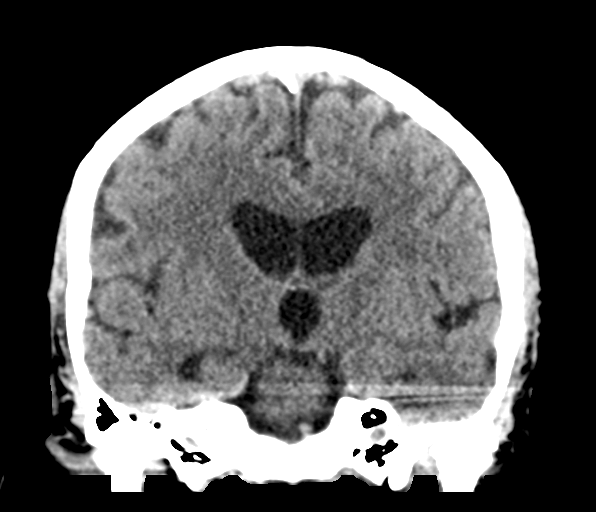

[Series 5: sagittal soft tissue · sagittal · 0.30mm/px · 3 of 53 slices shown]
[im 18/53  brain]
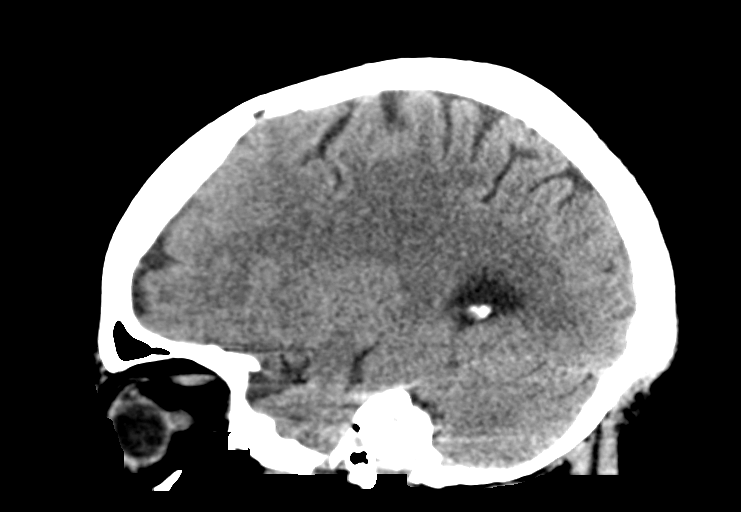
[im 27/53  brain]
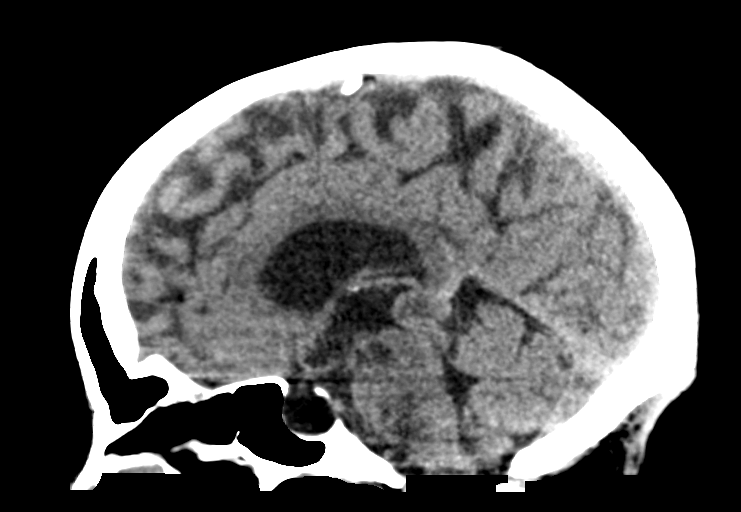
[im 35/53  brain]
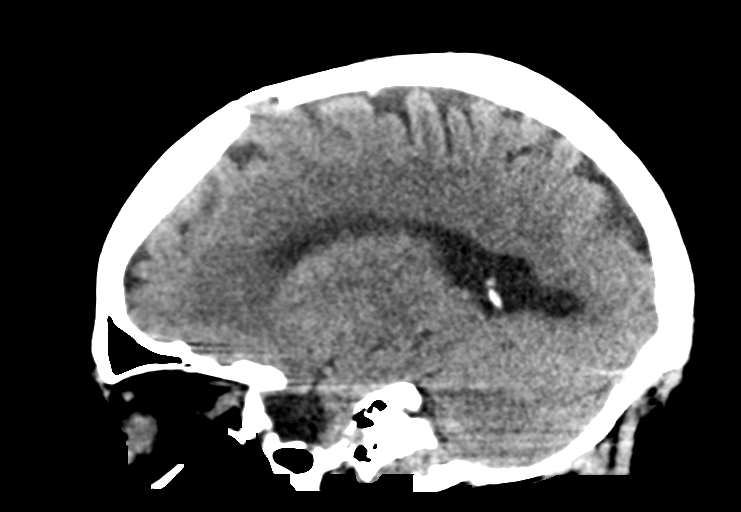

[15 of 46 positions shown; findings below may reference images not displayed]

FINDINGS: Brain: No acute intracranial hemorrhage. No focal mass lesion. No CT
evidence of acute infarction. No midline shift or mass effect. No
hydrocephalus. Basilar cisterns are patent.

The generalized cortical atrophy. Minimal subcortical white matter
change

Vascular: No hyperdense vessel or unexpected calcification.

Skull: Normal. Negative for fracture or focal lesion.

Sinuses/Orbits: Paranasal sinuses and mastoid air cells are clear.
Orbits are clear.

Other: None.
IMPRESSION: 1. No acute intracranial findings.
2. Mild cortical atrophy.

## 2017-02-22 MED ORDER — ONDANSETRON HCL 4 MG/2ML IJ SOLN
4.0000 mg | Freq: Once | INTRAMUSCULAR | Status: AC | PRN
  Administered 2017-02-22: 4 mg via INTRAVENOUS

## 2017-02-22 MED ORDER — MECLIZINE HCL 25 MG PO TABS: 25.0000 mg | ORAL_TABLET | Freq: Three times a day (TID) | ORAL | 1 refills | Status: DC | PRN

## 2017-02-22 MED ORDER — DIAZEPAM 5 MG PO TABS
5.0000 mg | ORAL_TABLET | Freq: Once | ORAL | Status: AC
  Administered 2017-02-22: 5 mg via ORAL
  Filled 2017-02-22: qty 1

## 2017-02-22 MED ORDER — SODIUM CHLORIDE 0.9 % IV SOLN
Freq: Once | INTRAVENOUS | Status: AC
  Administered 2017-02-22: 15:00:00 via INTRAVENOUS

## 2017-02-22 MED ORDER — ASPIRIN 81 MG PO TBEC: 81.0000 mg | DELAYED_RELEASE_TABLET | Freq: Every day | ORAL | 12 refills | Status: DC

## 2017-02-22 MED ORDER — ONDANSETRON HCL 4 MG/2ML IJ SOLN
INTRAMUSCULAR | Status: AC
  Administered 2017-02-22: 4 mg via INTRAVENOUS
  Filled 2017-02-22: qty 2

## 2017-02-22 MED ORDER — MECLIZINE HCL 25 MG PO TABS
50.0000 mg | ORAL_TABLET | Freq: Once | ORAL | Status: AC
  Administered 2017-02-22: 50 mg via ORAL
  Filled 2017-02-22 (×2): qty 2

## 2017-02-22 MED ORDER — DIAZEPAM 5 MG PO TABS: 5.0000 mg | ORAL_TABLET | Freq: Three times a day (TID) | ORAL | 0 refills | Status: DC | PRN

## 2017-02-22 NOTE — ED Provider Notes (Addendum)
[facility] Emergency Department Provider Note       Time seen: ----------------------------------------- 3:23 PM on 02/22/2017 -----------------------------------------     I have reviewed the triage vital signs and the nursing notes.   HISTORY   Chief Complaint Emesis    HPI Chelsea Glass is a 61 y.o. female who presents to the ED for vomiting this morning. She denies any abdominal pain. Patient describes waking up and feeling rooms meeting sensation. Subsequent she had copious vomiting and has the symptoms currently. She still feels like the room is spinning and still feels nauseous. She has also had a cough. She arrives actively vomiting.   Past Medical History:  Diagnosis Date  . Arthritis   . Diabetes mellitus without complication (HCC)   . Hypertension     There are no active problems to display for this patient.   Past Surgical History:  Procedure Laterality Date  . UTERINE FIBROID SURGERY      Allergies Lisinopril  Social History Social History  Substance Use Topics  . Smoking status: Not on file  . Smokeless tobacco: Not on file  . Alcohol use Not on file    Review of Systems Constitutional: Negative for fever. Eyes: Negative for vision changes ENT:  Negative for congestion, sore throat Cardiovascular: Negative for chest pain. Respiratory: Negative for shortness of breath. Gastrointestinal: Negative for abdominal pain,Positive for vomiting Genitourinary: Negative for dysuria. Musculoskeletal: Negative for back pain. Skin: Negative for rash. Neurological: Negative for headaches, focal weakness or numbness.Positive for dizziness  All systems negative/normal/unremarkable except as stated in the HPI  ____________________________________________   PHYSICAL EXAM:  VITAL SIGNS: ED Triage Vitals  Enc Vitals Group     BP 02/22/17 1212 (!) 163/93     Pulse Rate 02/22/17 1212 85     Resp 02/22/17 1212 16     Temp  02/22/17 1212 98.3 F (36.8 C)     Temp Source 02/22/17 1212 Oral     SpO2 --      Weight 02/22/17 1206 (!) 320 lb (145.2 kg)     Height 02/22/17 1206 5\' 6"  (1.676 m)     Head Circumference --      Peak Flow --      Pain Score 02/22/17 1205 0     Pain Loc --      Pain Edu? --      Excl. in GC? --     Constitutional: Alert and oriented. Well appearing and in no distress. Eyes: Conjunctivae are normal. PERRL. Normal extraocular movements. ENT   Head: Normocephalic and atraumatic.   Nose: No congestion/rhinnorhea.   Mouth/Throat: Mucous membranes are moist.   Neck: No stridor. Cardiovascular: Normal rate, regular rhythm. No murmurs, rubs, or gallops. Respiratory: Normal respiratory effort without tachypnea nor retractions. Breath sounds are clear and equal bilaterally. No wheezes/rales/rhonchi. Gastrointestinal: Soft and nontender. Normal bowel sounds Musculoskeletal: Nontender with normal range of motion in extremities. No lower extremity tenderness nor edema. Neurologic:  Normal speech and language. No gross focal neurologic deficits are appreciated. Strength, sensation, cranial nerves appear to be intact Skin:  Skin is warm, dry and intact. No rash noted. Psychiatric: Mood and affect are normal. Speech and behavior are normal.  ____________________________________________  ED COURSE:  Pertinent labs & imaging results that were available during my care of the patient were reviewed by me and considered in my medical decision making (see chart for details). Patient presents for peripheral vertigo symptoms, we will assess with labs and imaging  as indicated.   Procedures ____________________________________________   LABS (pertinent positives/negatives)  Labs Reviewed  COMPREHENSIVE METABOLIC PANEL - Abnormal; Notable for the following:       Result Value   Potassium 3.1 (*)    Glucose, Bld 139 (*)    All other components within normal limits  CBC - Abnormal;  Notable for the following:    WBC 12.9 (*)    RBC 5.45 (*)    MCV 75.8 (*)    MCH 24.7 (*)    RDW 15.0 (*)    All other components within normal limits  URINALYSIS, COMPLETE (UACMP) WITH MICROSCOPIC - Abnormal; Notable for the following:    Color, Urine AMBER (*)    APPearance HAZY (*)    Leukocytes, UA MODERATE (*)    Bacteria, UA RARE (*)    Squamous Epithelial / LPF 0-5 (*)    All other components within normal limits  LIPASE, BLOOD   EKG: Interpreted by me, sinus rhythm with first-degree AV block, normal QRS, normal QT, baseline artifact, normal axis.   RADIOLOGY Images were viewed by me  CT head  IMPRESSION: 1. No acute intracranial findings. 2. Mild cortical atrophy. ____________________________________________  FINAL ASSESSMENT AND PLAN  Vertigo  Plan: Patient's labs and imaging were dictated above. Patient had presented for Symptoms of peripheral vertigo. Workup here was unremarkable with normal CT and nonspecific laboratory findings. She is currently had improvement in her symptoms. She'll be discharged with meclizine, Valium and a baby aspirin. She is stable for outpatient follow-up.   Emily Filbert, MD   Note: This note was generated in part or whole with voice recognition software. Voice recognition is usually quite accurate but there are transcription errors that can and very often do occur. I apologize for any typographical errors that were not detected and corrected.     Emily Filbert, MD 02/22/17 1621    Emily Filbert, MD 02/22/17 [phone]

## 2017-02-22 NOTE — ED Triage Notes (Signed)
Pt reports vomiting this morning, denies abdominal pain. Pt does have cough and starts to cough, then vomits. Pt actively vomiting in triage.

## 2017-02-22 NOTE — ED Notes (Signed)
Pt given urine cup to obtain specimen 

## 2017-02-22 NOTE — ED Notes (Signed)
Patient transported to CT 

## 2017-07-08 ENCOUNTER — Ambulatory Visit: Payer: Self-pay

## 2017-08-21 ENCOUNTER — Ambulatory Visit: Payer: Medicaid Other

## 2017-10-02 ENCOUNTER — Emergency Department: Payer: Medicaid Other

## 2017-10-02 ENCOUNTER — Other Ambulatory Visit: Payer: Self-pay

## 2017-10-02 ENCOUNTER — Emergency Department
Admission: EM | Admit: 2017-10-02 | Discharge: 2017-10-02 | Disposition: A | Discharge status: Home / Self Care | Payer: Medicaid Other | Attending: Emergency Medicine | Admitting: Emergency Medicine

## 2017-10-02 DIAGNOSIS — Z7982 Long term (current) use of aspirin: Secondary | ICD-10-CM | POA: Insufficient documentation

## 2017-10-02 DIAGNOSIS — Z79899 Other long term (current) drug therapy: Secondary | ICD-10-CM | POA: Insufficient documentation

## 2017-10-02 DIAGNOSIS — E119 Type 2 diabetes mellitus without complications: Secondary | ICD-10-CM | POA: Diagnosis not present

## 2017-10-02 DIAGNOSIS — I10 Essential (primary) hypertension: Secondary | ICD-10-CM | POA: Insufficient documentation

## 2017-10-02 DIAGNOSIS — M722 Plantar fascial fibromatosis: Secondary | ICD-10-CM | POA: Diagnosis not present

## 2017-10-02 DIAGNOSIS — M79672 Pain in left foot: Secondary | ICD-10-CM | POA: Diagnosis present

## 2017-10-02 IMAGING — DX DG ANKLE 2V *L*
2 series · 2 of 2 positions shown · non-contrast
Comparison: None.

CLINICAL DATA: 61-year-old female with a history of heel pain. No
known injury

EXAM:
LEFT ANKLE - 2 VIEW

[ankle ap]
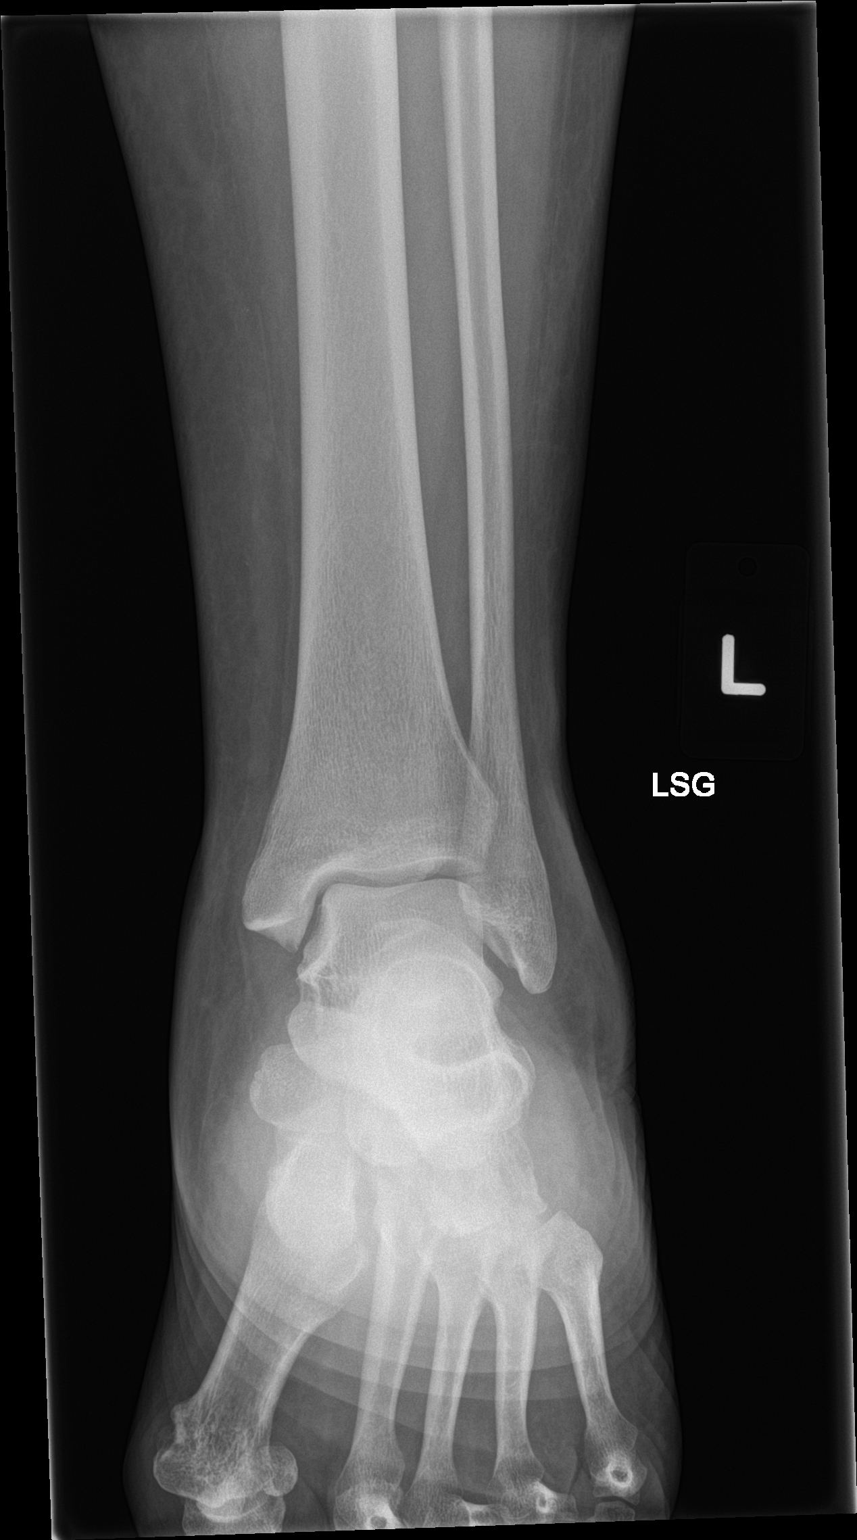

[ankle lat]
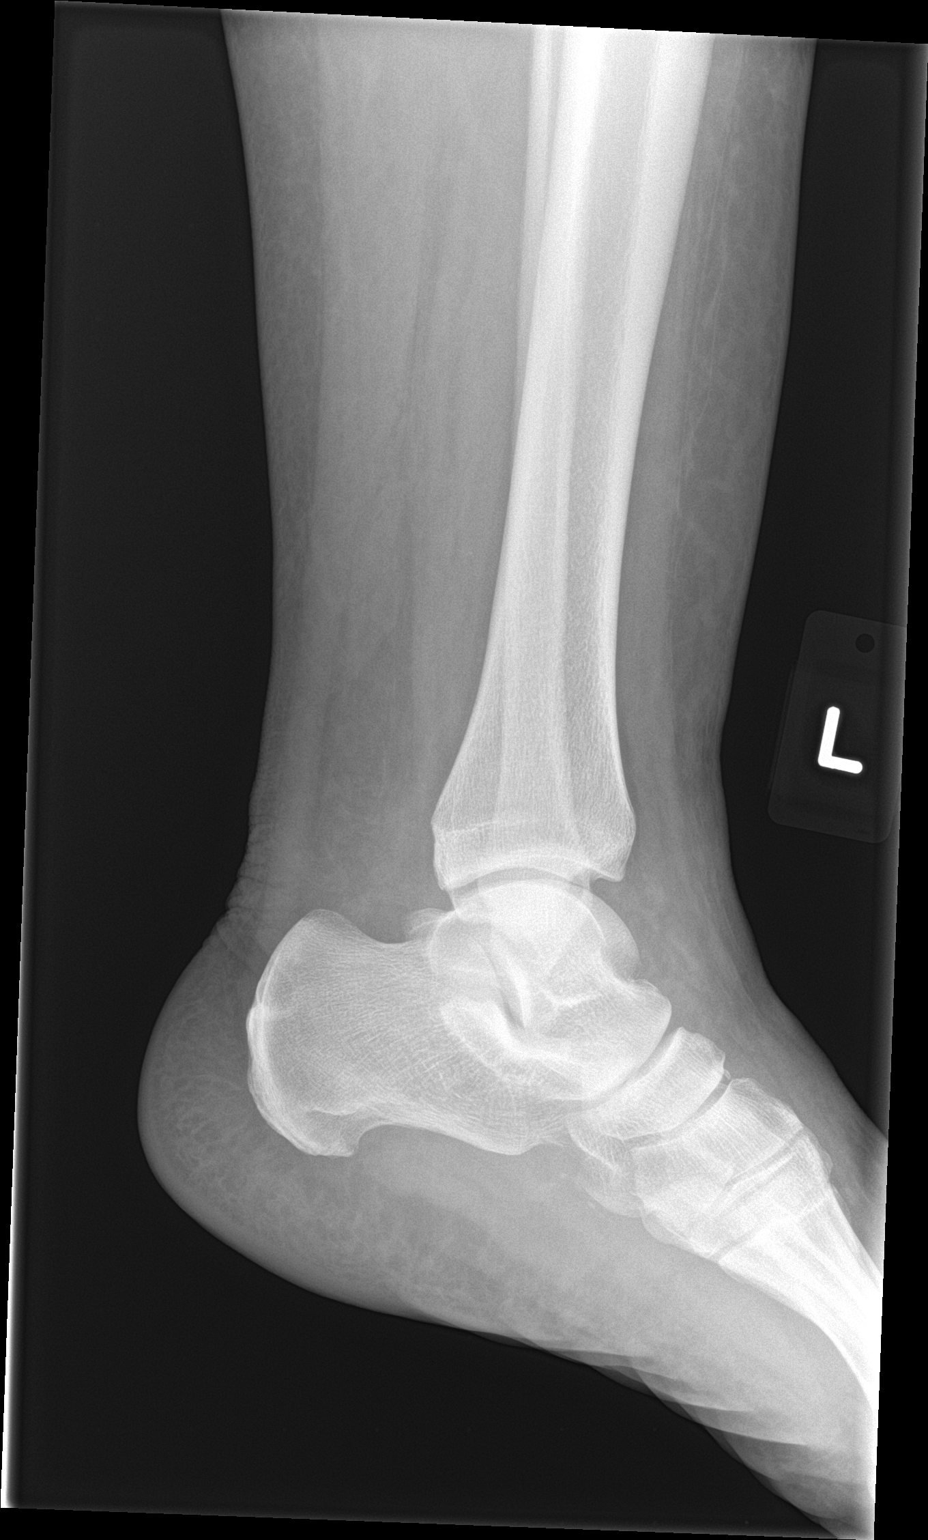

[2 of 2 positions shown; findings below may reference images not displayed]

FINDINGS: No acute displaced fracture. No focal soft tissue swelling. No
radiopaque foreign body. Lateral view demonstrates no evidence of
joint effusion at the tibiotalar joint. Mild degenerative changes of
the midfoot and hindfoot.
IMPRESSION: Negative for acute bony abnormality

## 2017-10-02 MED ORDER — TRAMADOL HCL 50 MG PO TABS
50.0000 mg | ORAL_TABLET | Freq: Once | ORAL | Status: AC
  Administered 2017-10-02: 50 mg via ORAL
  Filled 2017-10-02: qty 1

## 2017-10-02 MED ORDER — TRAMADOL HCL 50 MG PO TABS: 50.0000 mg | ORAL_TABLET | Freq: Four times a day (QID) | ORAL | 0 refills | Status: AC | PRN

## 2017-10-02 MED ORDER — NAPROXEN 500 MG PO TABS: 500.0000 mg | ORAL_TABLET | Freq: Two times a day (BID) | ORAL | Status: DC

## 2017-10-02 MED ORDER — NAPROXEN 500 MG PO TABS
500.0000 mg | ORAL_TABLET | Freq: Once | ORAL | Status: AC
  Administered 2017-10-02: 500 mg via ORAL
  Filled 2017-10-02: qty 1

## 2017-10-02 NOTE — ED Triage Notes (Signed)
Pt c/o left foot/heel pain for the past 2 days. Denies injury. Denies having any wounds to the foot.

## 2017-10-02 NOTE — ED Provider Notes (Signed)
[facility] Emergency Department Provider Note   ____________________________________________   First MD Initiated Contact with Patient 10/02/17 1330     (approximate)  I have reviewed the triage vital signs and the nursing notes.   HISTORY  Chief Complaint Foot Pain    HPI Chelsea Glass is a 61 y.o. female patient complaining of plantar pain at the forefoot and hindfoot for 2 days. Patient denies provocative incident for pain.. Patient states pain increase with weightbearing and ambulation.Patient rates the pain as a 9/10. She describes pain as "achy". No palpable deformity for complaint.   Past Medical History:  Diagnosis Date  . Arthritis   . Diabetes mellitus without complication (HCC)   . Hypertension     There are no active problems to display for this patient.   Past Surgical History:  Procedure Laterality Date  . TUBAL LIGATION    . UTERINE FIBROID SURGERY      Prior to Admission medications   Medication Sig Start Date End Date Taking? Authorizing Provider  aspirin 81 MG EC tablet Take 1 tablet (81 mg total) by mouth daily. Swallow whole. 02/22/17   Emily FilbertWilliams, Jonathan E, MD  carvedilol (COREG) 12.5 MG tablet Take 1 tablet (12.5 mg total) by mouth 2 (two) times daily with a meal. 09/22/16   Darci CurrentBrown, Methow N, MD  diazepam (VALIUM) 5 MG tablet Take 1 tablet (5 mg total) by mouth every 8 (eight) hours as needed (for vertigo symptoms). 02/22/17   Emily FilbertWilliams, Jonathan E, MD  diphenhydrAMINE (BENADRYL) 25 mg capsule Take 1 capsule (25 mg total) by mouth every 4 (four) hours as needed. 08/01/16 08/01/17  Emily FilbertWilliams, Jonathan E, MD  meclizine (ANTIVERT) 25 MG tablet Take 1 tablet (25 mg total) by mouth 3 (three) times daily as needed for dizziness or nausea. 02/22/17   Emily FilbertWilliams, Jonathan E, MD  naproxen (NAPROSYN) 500 MG tablet Take 1 tablet (500 mg total) by mouth 2 (two) times daily with a meal. 10/02/17   Joni ReiningSmith, Odysseus Cada K, PA-C  predniSONE  (STERAPRED UNI-PAK 21 TAB) 10 MG (21) TBPK tablet Dispense steroid taper pack as directed 08/01/16   Emily FilbertWilliams, Jonathan E, MD  traMADol (ULTRAM) 50 MG tablet Take 1 tablet (50 mg total) by mouth every 6 (six) hours as needed. 10/02/17 10/02/18  Joni ReiningSmith, Ashanti Ratti K, PA-C    Allergies Lisinopril  No family history on file.  Social History Social History   Tobacco Use  . Smoking status: Never Smoker  . Smokeless tobacco: Never Used  Substance Use Topics  . Alcohol use: No    Frequency: Never  . Drug use: No    Review of Systems  Constitutional: No fever/chills Eyes: No visual changes. ENT: No sore throat. Cardiovascular: Denies chest pain. Respiratory: Denies shortness of breath. Gastrointestinal: No abdominal pain.  No nausea, no vomiting.  No diarrhea.  No constipation. Genitourinary: Negative for dysuria. Musculoskeletal: Left foot and ankle pain Skin: Negative for rash. Neurological: Negative for headaches, focal weakness or numbness. Endocrine:Diabetes and hypertension Allergic/Immunilogical: Lisinopril ____________________________________________   PHYSICAL EXAM:  VITAL SIGNS: ED Triage Vitals  Enc Vitals Group     BP 10/02/17 1300 (!) 152/84     Pulse Rate 10/02/17 1300 76     Resp 10/02/17 1300 17     Temp 10/02/17 1300 98.3 F (36.8 C)     Temp Source 10/02/17 1300 Oral     SpO2 10/02/17 1300 96 %     Weight 10/02/17 1301 (!) 323 lb (146.5 kg)  Height 10/02/17 1301 5\' 6"  (1.676 m)     Head Circumference --      Peak Flow --      Pain Score 10/02/17 1300 9     Pain Loc --      Pain Edu? --      Excl. in GC? --     Constitutional: Alert and oriented. Well appearing and in no acute distress. Morbid obesity Cardiovascular: Normal rate, regular rhythm. Grossly normal heart sounds.  Good peripheral circulation. Elevated blood pressure Respiratory: Normal respiratory effort.  No retractions. Lungs CTAB. Gastrointestinal: Soft and nontender. No distention.  No abdominal bruits. No CVA tenderness. Musculoskeletal: Pes Planus.  Neurologic:  Normal speech and language. No gross focal neurologic deficits are appreciated. No gait instability. Skin:  Skin is warm, dry and intact. No rash noted. Psychiatric: Mood and affect are normal. Speech and behavior are normal.  ____________________________________________   LABS (all labs ordered are listed, but only abnormal results are displayed)  Labs Reviewed - No data to display ____________________________________________  EKG   ____________________________________________  RADIOLOGY  Dg Ankle 2 Views Left  Result Date: 10/02/2017 CLINICAL DATA:  61 year old female with a history of heel pain. No known injury EXAM: LEFT ANKLE - 2 VIEW COMPARISON:  None. FINDINGS: No acute displaced fracture. No focal soft tissue swelling. No radiopaque foreign body. Lateral view demonstrates no evidence of joint effusion at the tibiotalar joint. Mild degenerative changes of the midfoot and hindfoot. IMPRESSION: Negative for acute bony abnormality Electronically Signed   By: Gilmer MorJaime  Wagner D.O.   On: 10/02/2017 13:47    ____________________________________________   PROCEDURES  Procedure(s) performed: None  Procedures  Critical Care performed: No  ____________________________________________   INITIAL IMPRESSION / ASSESSMENT AND PLAN / ED COURSE  As part of my medical decision making, I reviewed the following data within the electronic MEDICAL RECORD NUMBER    Left foot pain secondary to plantar fasciitis. Discussed x-ray findings with patient. Patient given discharge care instructions advised take medication as directed. Patient advised to follow-up with podiatry condition persists.      ____________________________________________   FINAL CLINICAL IMPRESSION(S) / ED DIAGNOSES  Final diagnoses:  Plantar fasciitis of left foot     ED Discharge Orders        Ordered    traMADol (ULTRAM) 50  MG tablet  Every 6 hours PRN     10/02/17 1420    naproxen (NAPROSYN) 500 MG tablet  2 times daily with meals     10/02/17 1420       Note:  This document was prepared using Dragon voice recognition software and may include unintentional dictation errors.    Joni ReiningSmith, Helton Oleson K, PA-C 10/02/17 1420    Emily FilbertWilliams, Jonathan E, MD 10/02/17 [phone]

## 2017-10-02 NOTE — ED Notes (Signed)

## 2017-10-02 NOTE — Discharge Instructions (Signed)
Take medications as directed and follow-up with podiatry if  condition persists

## 2017-10-02 NOTE — ED Notes (Signed)
First Nurse Note:  Patient complaining of pain left foot, states she is a diabetic.  Alert and oriented.

## 2017-10-23 ENCOUNTER — Ambulatory Visit: Payer: Medicaid Other | Attending: Oncology

## 2017-12-17 ENCOUNTER — Other Ambulatory Visit: Payer: Self-pay | Admitting: Obstetrics and Gynecology

## 2017-12-17 DIAGNOSIS — Z1231 Encounter for screening mammogram for malignant neoplasm of breast: Secondary | ICD-10-CM

## 2017-12-17 DIAGNOSIS — Z124 Encounter for screening for malignant neoplasm of cervix: Secondary | ICD-10-CM

## 2017-12-17 DIAGNOSIS — N950 Postmenopausal bleeding: Secondary | ICD-10-CM

## 2017-12-17 DIAGNOSIS — I10 Essential (primary) hypertension: Secondary | ICD-10-CM

## 2018-09-25 ENCOUNTER — Other Ambulatory Visit: Payer: Self-pay | Admitting: Obstetrics and Gynecology

## 2018-10-29 ENCOUNTER — Encounter: Payer: Self-pay | Admitting: *Deleted

## 2018-10-29 ENCOUNTER — Emergency Department: Payer: Medicaid Other

## 2018-10-29 ENCOUNTER — Other Ambulatory Visit: Payer: Self-pay

## 2018-10-29 ENCOUNTER — Emergency Department
Admission: EM | Admit: 2018-10-29 | Discharge: 2018-10-29 | Disposition: A | Discharge status: Home / Self Care | Payer: Medicaid Other | Attending: Emergency Medicine | Admitting: Emergency Medicine

## 2018-10-29 DIAGNOSIS — J111 Influenza due to unidentified influenza virus with other respiratory manifestations: Secondary | ICD-10-CM

## 2018-10-29 DIAGNOSIS — J101 Influenza due to other identified influenza virus with other respiratory manifestations: Secondary | ICD-10-CM | POA: Insufficient documentation

## 2018-10-29 DIAGNOSIS — I10 Essential (primary) hypertension: Secondary | ICD-10-CM | POA: Insufficient documentation

## 2018-10-29 DIAGNOSIS — E876 Hypokalemia: Secondary | ICD-10-CM

## 2018-10-29 DIAGNOSIS — E119 Type 2 diabetes mellitus without complications: Secondary | ICD-10-CM | POA: Insufficient documentation

## 2018-10-29 DIAGNOSIS — R509 Fever, unspecified: Secondary | ICD-10-CM | POA: Diagnosis present

## 2018-10-29 LAB — CBC WITH DIFFERENTIAL/PLATELET
Abs Immature Granulocytes: 0.09 10*3/uL — ABNORMAL HIGH (ref 0.00–0.07)
Basophils Absolute: 0.1 10*3/uL (ref 0.0–0.1)
Basophils Relative: 1 %
EOS ABS: 0.1 10*3/uL (ref 0.0–0.5)
Eosinophils Relative: 1 %
HCT: 43.9 % (ref 36.0–46.0)
Hemoglobin: 13.9 g/dL (ref 12.0–15.0)
Immature Granulocytes: 1 %
Lymphocytes Relative: 8 %
Lymphs Abs: 0.7 10*3/uL (ref 0.7–4.0)
MCH: 25.1 pg — ABNORMAL LOW (ref 26.0–34.0)
MCHC: 31.7 g/dL (ref 30.0–36.0)
MCV: 79.4 fL — ABNORMAL LOW (ref 80.0–100.0)
Monocytes Absolute: 1 10*3/uL (ref 0.1–1.0)
Monocytes Relative: 11 %
NEUTROS PCT: 78 %
Neutro Abs: 6.8 10*3/uL (ref 1.7–7.7)
Platelets: 261 10*3/uL (ref 150–400)
RBC: 5.53 MIL/uL — ABNORMAL HIGH (ref 3.87–5.11)
RDW: 15.5 % (ref 11.5–15.5)
WBC: 8.7 10*3/uL (ref 4.0–10.5)
nRBC: 0 % (ref 0.0–0.2)

## 2018-10-29 LAB — COMPREHENSIVE METABOLIC PANEL
ALT: 39 U/L (ref 0–44)
AST: 42 U/L — ABNORMAL HIGH (ref 15–41)
Albumin: 3.9 g/dL (ref 3.5–5.0)
Alkaline Phosphatase: 99 U/L (ref 38–126)
Anion gap: 11 (ref 5–15)
BILIRUBIN TOTAL: 0.6 mg/dL (ref 0.3–1.2)
BUN: 13 mg/dL (ref 8–23)
CO2: 26 mmol/L (ref 22–32)
Calcium: 9.1 mg/dL (ref 8.9–10.3)
Chloride: 97 mmol/L — ABNORMAL LOW (ref 98–111)
Creatinine, Ser: 0.87 mg/dL (ref 0.44–1.00)
GFR calc Af Amer: >60 mL/min (ref >60)
GFR calc non Af Amer: >60 mL/min (ref >60)
GLUCOSE: 155 mg/dL — AB (ref 70–99)
Potassium: 2.7 mmol/L — CL (ref 3.5–5.1)
Sodium: 134 mmol/L — ABNORMAL LOW (ref 135–145)
Total Protein: 7.7 g/dL (ref 6.5–8.1)

## 2018-10-29 LAB — LACTIC ACID, PLASMA: Lactic Acid, Venous: 1.1 mmol/L (ref 0.5–1.9)

## 2018-10-29 LAB — INFLUENZA PANEL BY PCR (TYPE A & B)
Influenza A By PCR: POSITIVE — AB
Influenza B By PCR: NEGATIVE

## 2018-10-29 IMAGING — CR DG CHEST 2V
1 series · 2 of 2 positions shown · non-contrast
Comparison: [DATE]

CLINICAL DATA: Generalized body ache and weakness throughout this
week after returning from vacation.

EXAM:
CHEST - 2 VIEW

[Series 1: dg chest 2 view · 0.14mm/px · 2 of 2 slices shown]
[im 1/2]
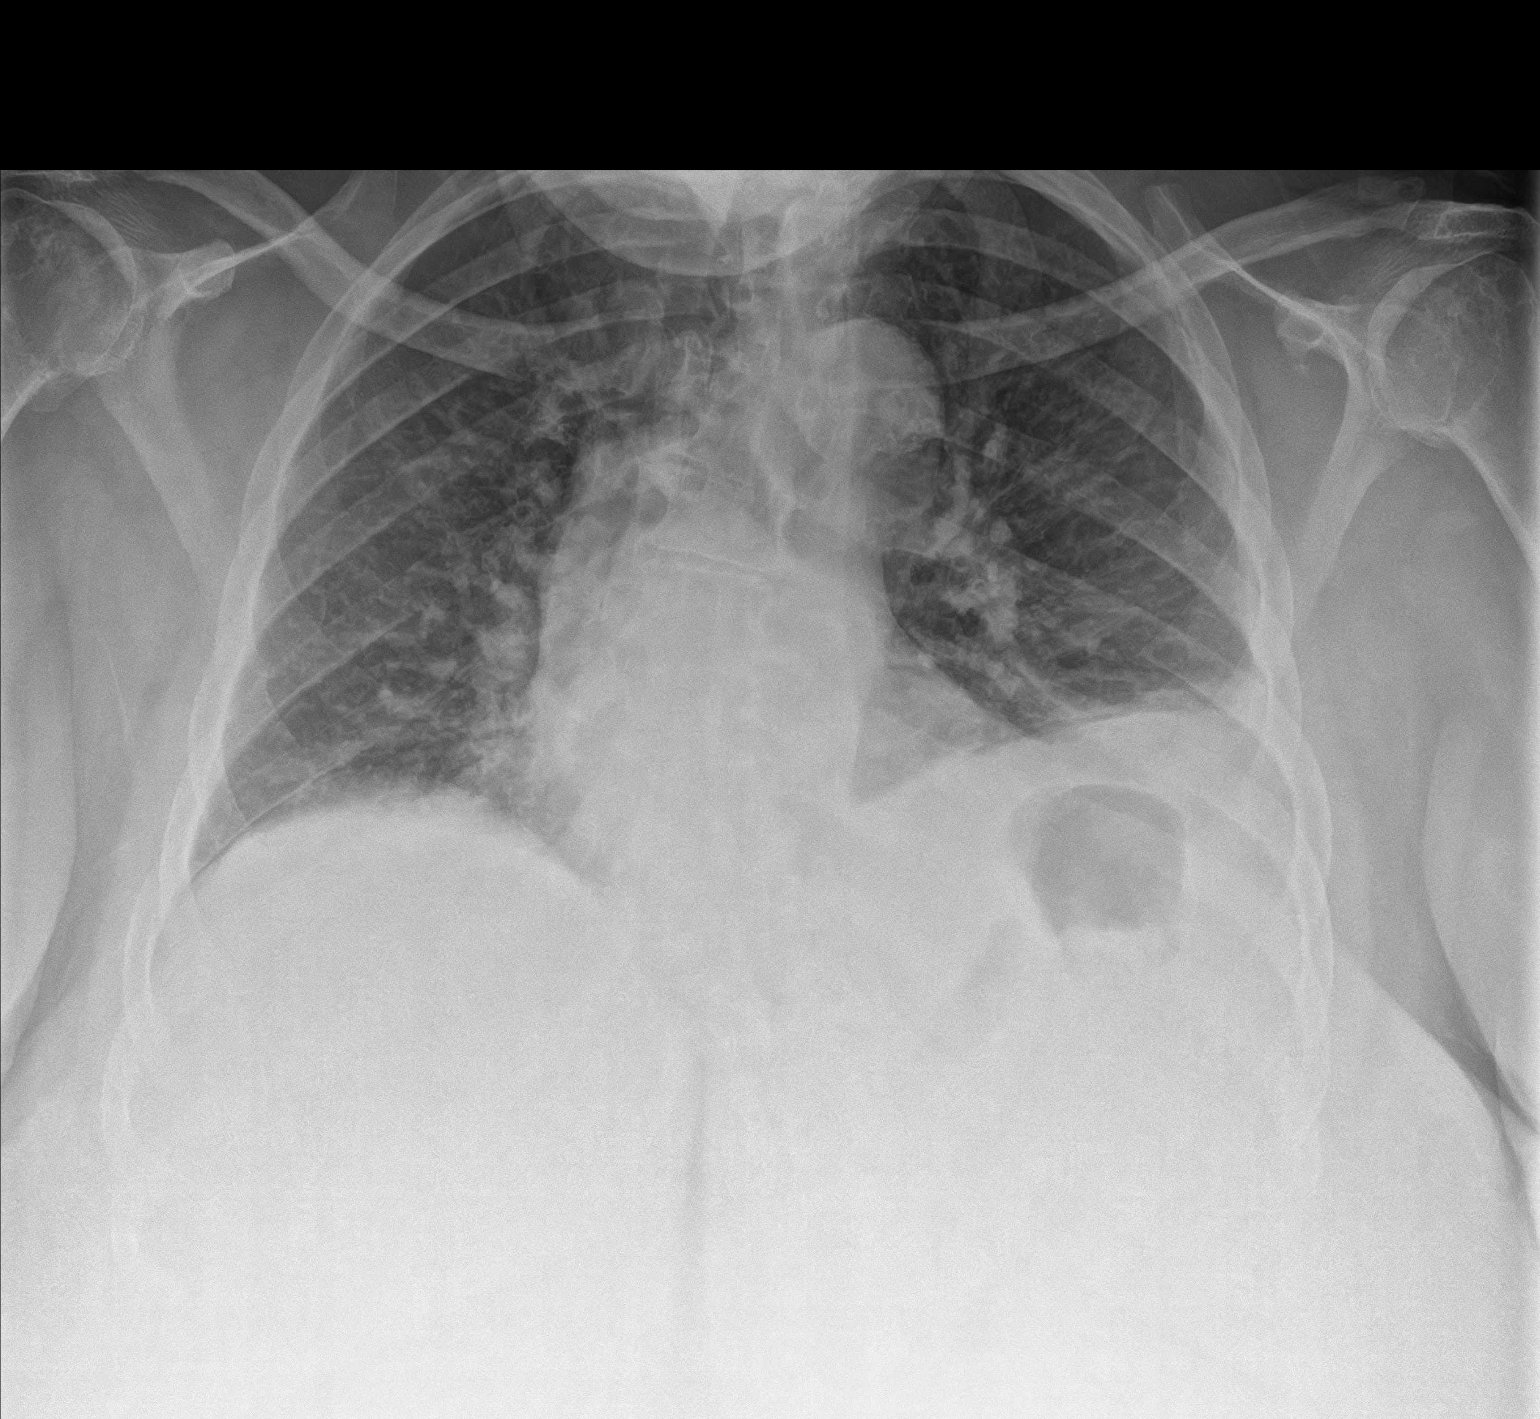
[im 2/2]
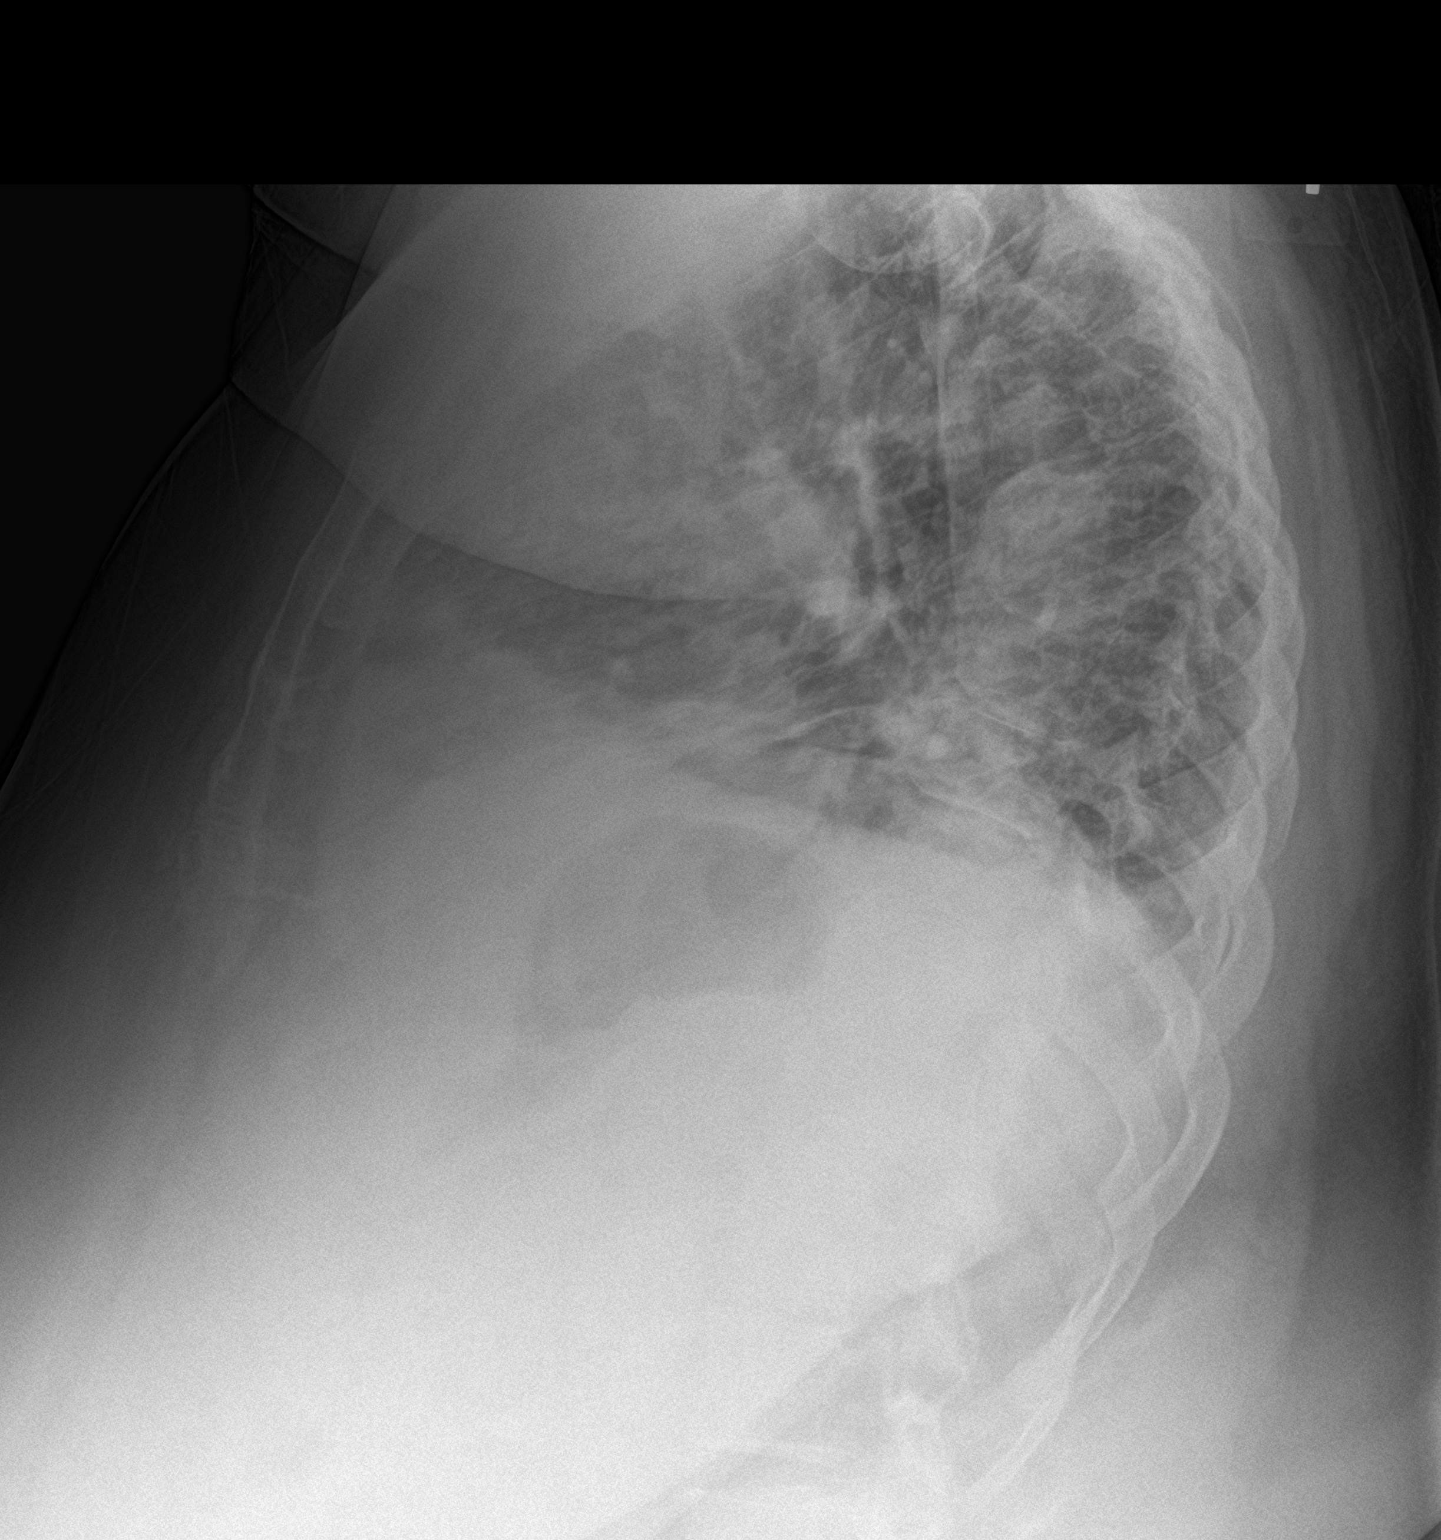

[2 of 2 positions shown; findings below may reference images not displayed]

FINDINGS: Low lung volumes with bibasilar atelectasis. Top-normal heart size
with slight uncoiling of the thoracic aorta. Mild vascular
congestion without effusion or pneumothorax. Osteoarthritis of the
included glenohumeral and AC joints.
IMPRESSION: Low lung volumes with bibasilar atelectasis. Mild vascular
congestion.

## 2018-10-29 MED ORDER — KCL IN DEXTROSE-NACL 20-5-0.45 MEQ/L-%-% IV SOLN
INTRAVENOUS | Status: DC
  Administered 2018-10-29: 18:00:00 via INTRAVENOUS
  Filled 2018-10-29: qty 1000

## 2018-10-29 MED ORDER — OSELTAMIVIR PHOSPHATE 75 MG PO CAPS: 75.0000 mg | ORAL_CAPSULE | Freq: Two times a day (BID) | ORAL | 0 refills | Status: AC

## 2018-10-29 MED ORDER — OSELTAMIVIR PHOSPHATE 75 MG PO CAPS
75.0000 mg | ORAL_CAPSULE | Freq: Once | ORAL | Status: AC
  Administered 2018-10-29: 75 mg via ORAL
  Filled 2018-10-29: qty 1

## 2018-10-29 MED ORDER — ONDANSETRON HCL 4 MG/2ML IJ SOLN
4.0000 mg | Freq: Once | INTRAMUSCULAR | Status: AC
  Administered 2018-10-29: 4 mg via INTRAVENOUS
  Filled 2018-10-29: qty 2

## 2018-10-29 MED ORDER — POTASSIUM CHLORIDE ER 10 MEQ PO TBCR: 10.0000 meq | EXTENDED_RELEASE_TABLET | Freq: Every day | ORAL | 0 refills | Status: DC

## 2018-10-29 MED ORDER — KETOROLAC TROMETHAMINE 30 MG/ML IJ SOLN
30.0000 mg | Freq: Once | INTRAMUSCULAR | Status: AC
  Administered 2018-10-29: 30 mg via INTRAVENOUS
  Filled 2018-10-29: qty 1

## 2018-10-29 MED ORDER — SODIUM CHLORIDE 0.9% FLUSH
3.0000 mL | Freq: Once | INTRAVENOUS | Status: AC
  Administered 2018-10-29: 3 mL via INTRAVENOUS

## 2018-10-29 MED ORDER — SODIUM CHLORIDE 0.9 % IV SOLN: Freq: Once | INTRAVENOUS | Status: DC

## 2018-10-29 MED ORDER — OSELTAMIVIR PHOSPHATE 75 MG PO CAPS: 75.0000 mg | ORAL_CAPSULE | Freq: Two times a day (BID) | ORAL | 0 refills | Status: DC

## 2018-10-29 MED ORDER — ACETAMINOPHEN 325 MG PO TABS
650.0000 mg | ORAL_TABLET | Freq: Once | ORAL | Status: AC | PRN
  Administered 2018-10-29: 650 mg via ORAL
  Filled 2018-10-29: qty 2

## 2018-10-29 MED ORDER — POTASSIUM CHLORIDE CRYS ER 20 MEQ PO TBCR
40.0000 meq | EXTENDED_RELEASE_TABLET | Freq: Once | ORAL | Status: AC
  Administered 2018-10-29: 40 meq via ORAL
  Filled 2018-10-29: qty 2

## 2018-10-29 MED ORDER — HYDROCOD POLST-CPM POLST ER 10-8 MG/5ML PO SUER: 5.0000 mL | Freq: Two times a day (BID) | ORAL | 0 refills | Status: DC

## 2018-10-29 NOTE — ED Triage Notes (Signed)
PT to ED reporting generalized body aches and increased weakness throughout this week after returning from a vacation. PT denies having been exposed to others with the flu but now presents with a fever in trigage of 101.6. NVD as well with decreased PO intake. Marland Kitchen

## 2018-10-29 NOTE — ED Notes (Signed)
ED Provider at bedside. 

## 2018-10-29 NOTE — ED Provider Notes (Signed)
[facility] Emergency Department Provider Note       Time seen: ----------------------------------------- 6:19 PM on 10/29/2018 -----------------------------------------   I have reviewed the triage vital signs and the nursing notes.  HISTORY   Chief Complaint Influenza   HPI Chelsea Glass is a 63 y.o. female with a history of arthritis, diabetes, hypertension who presents to the ED for generalized body aches with increased weakness for several days after returning from vacation.  Patient states she has had a fever at home as well as chills, congestion and cough.  She denies nausea, vomiting or diarrhea.  Past Medical History:  Diagnosis Date  . Arthritis   . Diabetes mellitus without complication (HCC)   . Hypertension     There are no active problems to display for this patient.   Past Surgical History:  Procedure Laterality Date  . TUBAL LIGATION    . UTERINE FIBROID SURGERY      Allergies Lisinopril  Social History Social History   Tobacco Use  . Smoking status: Never Smoker  . Smokeless tobacco: Never Used  Substance Use Topics  . Alcohol use: No    Frequency: Never  . Drug use: No   Review of Systems Constitutional: Positive for fever and chills, myalgias Cardiovascular: Negative for chest pain. Respiratory: Positive for cough Gastrointestinal: Negative for abdominal pain, vomiting and diarrhea. Musculoskeletal: Negative for back pain. Skin: Negative for rash. Neurological: Negative for headaches, positive for weakness  All systems negative/normal/unremarkable except as stated in the HPI  ____________________________________________   PHYSICAL EXAM:  VITAL SIGNS: ED Triage Vitals  Enc Vitals Group     BP 10/29/18 1634 (!) 174/96     Pulse Rate 10/29/18 1634 (!) 121     Resp 10/29/18 1634 16     Temp 10/29/18 1634 (!) 101.6 F (38.7 C)     Temp Source 10/29/18 1634 Oral     SpO2 10/29/18 1634 93 %     Weight  10/29/18 1635 (!) 325 lb 2.9 oz (147.5 kg)     Height --      Head Circumference --      Peak Flow --      Pain Score 10/29/18 1635 10     Pain Loc --      Pain Edu? --      Excl. in GC? --    Constitutional: Alert and oriented. Well appearing and in no distress. Eyes: Conjunctivae are normal. Normal extraocular movements. ENT      Head: Normocephalic and atraumatic.      Nose: No congestion/rhinnorhea.      Mouth/Throat: Mucous membranes are moist.      Neck: No stridor. Cardiovascular: Normal rate, regular rhythm. No murmurs, rubs, or gallops. Respiratory: Normal respiratory effort without tachypnea nor retractions. Breath sounds are clear and equal bilaterally. No wheezes/rales/rhonchi. Gastrointestinal: Soft and nontender. Normal bowel sounds Musculoskeletal: Nontender with normal range of motion in extremities. No lower extremity tenderness nor edema. Neurologic:  Normal speech and language. No gross focal neurologic deficits are appreciated.  Skin:  Skin is warm, dry and intact. No rash noted. Psychiatric: Mood and affect are normal. Speech and behavior are normal.  ____________________________________________  EKG: Interpreted by me.  Sinus tachycardia with rate of 122 bpm, LVH, normal QRS, normal QT  ____________________________________________  ED COURSE:  As part of my medical decision making, I reviewed the following data within the electronic MEDICAL RECORD NUMBER History obtained from family if available, nursing notes, old chart and  ekg, as well as notes from prior ED visits. Patient presented for flulike symptoms, we will assess with labs and imaging as indicated at this time. Clinical Course as of Oct 29 1817  Wed Oct 29, 2018  1755 Influenza A By PCR(!): POSITIVE [JW]  1756 Potassium(!!): 2.7 [JW]    Clinical Course User Index [JW] Emily Filbert, MD   Procedures ____________________________________________   LABS (pertinent positives/negatives)  Labs  Reviewed  COMPREHENSIVE METABOLIC PANEL - Abnormal; Notable for the following components:      Result Value   Sodium 134 (*)    Potassium 2.7 (*)    Chloride 97 (*)    Glucose, Bld 155 (*)    AST 42 (*)    All other components within normal limits  CBC WITH DIFFERENTIAL/PLATELET - Abnormal; Notable for the following components:   RBC 5.53 (*)    MCV 79.4 (*)    MCH 25.1 (*)    Abs Immature Granulocytes 0.09 (*)    All other components within normal limits  INFLUENZA PANEL BY PCR (TYPE A & B) - Abnormal; Notable for the following components:   Influenza A By PCR POSITIVE (*)    All other components within normal limits  LACTIC ACID, PLASMA  LACTIC ACID, PLASMA  URINALYSIS, COMPLETE (UACMP) WITH MICROSCOPIC    RADIOLOGY  Chest x-ray is unremarkable  ____________________________________________   DIFFERENTIAL DIAGNOSIS   Influenza, dehydration, electrolyte abnormality, sepsis  FINAL ASSESSMENT AND PLAN  Influenza, hypokalemia   Plan: The patient had presented for weakness and flulike symptoms. Patient's labs indicate influenza A positivity.  She was also hypokalemic with a potassium of 2.7.  She was given oral and IV potassium in her IV fluids. Patient's imaging was unremarkable.  I will likely start her on Tamiflu with outpatient follow-up, overall she appears well.   Ulice Dash, MD    Note: This note was generated in part or whole with voice recognition software. Voice recognition is usually quite accurate but there are transcription errors that can and very often do occur. I apologize for any typographical errors that were not detected and corrected.     Emily Filbert, MD 10/29/18 Rickey Primus

## 2018-10-29 NOTE — ED Notes (Signed)
Report off to john rn.   

## 2018-10-29 NOTE — ED Notes (Signed)
Pt brought in by family with flulike sx and headache.  Sx for 2 days.  Pt has a cough and bodyaches.  Non smoker. Fever today.  Pt alert  Speech clear.

## 2018-11-11 ENCOUNTER — Other Ambulatory Visit: Payer: Self-pay | Admitting: Obstetrics and Gynecology

## 2018-11-27 ENCOUNTER — Other Ambulatory Visit: Payer: Self-pay | Admitting: Obstetrics and Gynecology

## 2018-11-27 DIAGNOSIS — Z1231 Encounter for screening mammogram for malignant neoplasm of breast: Secondary | ICD-10-CM

## 2019-05-12 ENCOUNTER — Other Ambulatory Visit: Payer: Self-pay | Admitting: Obstetrics and Gynecology

## 2019-05-12 DIAGNOSIS — Z1231 Encounter for screening mammogram for malignant neoplasm of breast: Secondary | ICD-10-CM

## 2019-07-31 ENCOUNTER — Emergency Department: Payer: Medicare Other

## 2019-07-31 ENCOUNTER — Other Ambulatory Visit: Payer: Self-pay

## 2019-07-31 ENCOUNTER — Encounter: Payer: Self-pay | Admitting: Intensive Care

## 2019-07-31 ENCOUNTER — Emergency Department
Admission: EM | Admit: 2019-07-31 | Discharge: 2019-07-31 | Disposition: A | Discharge status: Home / Self Care | Payer: Medicare Other | Attending: Emergency Medicine | Admitting: Emergency Medicine

## 2019-07-31 DIAGNOSIS — Z7982 Long term (current) use of aspirin: Secondary | ICD-10-CM | POA: Insufficient documentation

## 2019-07-31 DIAGNOSIS — M79662 Pain in left lower leg: Secondary | ICD-10-CM

## 2019-07-31 DIAGNOSIS — I10 Essential (primary) hypertension: Secondary | ICD-10-CM | POA: Insufficient documentation

## 2019-07-31 DIAGNOSIS — Z7984 Long term (current) use of oral hypoglycemic drugs: Secondary | ICD-10-CM | POA: Diagnosis not present

## 2019-07-31 DIAGNOSIS — E119 Type 2 diabetes mellitus without complications: Secondary | ICD-10-CM | POA: Diagnosis not present

## 2019-07-31 DIAGNOSIS — Z79899 Other long term (current) drug therapy: Secondary | ICD-10-CM | POA: Insufficient documentation

## 2019-07-31 DIAGNOSIS — M25512 Pain in left shoulder: Secondary | ICD-10-CM | POA: Diagnosis present

## 2019-07-31 DIAGNOSIS — Z20828 Contact with and (suspected) exposure to other viral communicable diseases: Secondary | ICD-10-CM | POA: Insufficient documentation

## 2019-07-31 LAB — CBC WITH DIFFERENTIAL/PLATELET
Abs Immature Granulocytes: 0.1 10*3/uL — ABNORMAL HIGH (ref 0.00–0.07)
Basophils Absolute: 0.1 10*3/uL (ref 0.0–0.1)
Basophils Relative: 0 %
Eosinophils Absolute: 0.2 10*3/uL (ref 0.0–0.5)
Eosinophils Relative: 2 %
HCT: 40.9 % (ref 36.0–46.0)
Hemoglobin: 12.8 g/dL (ref 12.0–15.0)
Immature Granulocytes: 1 %
Lymphocytes Relative: 19 %
Lymphs Abs: 2.2 10*3/uL (ref 0.7–4.0)
MCH: 25 pg — ABNORMAL LOW (ref 26.0–34.0)
MCHC: 31.3 g/dL (ref 30.0–36.0)
MCV: 80 fL (ref 80.0–100.0)
Monocytes Absolute: 1.3 10*3/uL — ABNORMAL HIGH (ref 0.1–1.0)
Monocytes Relative: 12 %
Neutro Abs: 7.4 10*3/uL (ref 1.7–7.7)
Neutrophils Relative %: 66 %
Platelets: 260 10*3/uL (ref 150–400)
RBC: 5.11 MIL/uL (ref 3.87–5.11)
RDW: 15 % (ref 11.5–15.5)
WBC: 11.3 10*3/uL — ABNORMAL HIGH (ref 4.0–10.5)
nRBC: 0 % (ref 0.0–0.2)

## 2019-07-31 LAB — SARS CORONAVIRUS 2 (TAT 6-24 HRS): SARS Coronavirus 2: NEGATIVE

## 2019-07-31 LAB — COMPREHENSIVE METABOLIC PANEL
ALT: 17 U/L (ref 0–44)
AST: 16 U/L (ref 15–41)
Albumin: 3.5 g/dL (ref 3.5–5.0)
Alkaline Phosphatase: 77 U/L (ref 38–126)
Anion gap: 12 (ref 5–15)
BUN: 15 mg/dL (ref 8–23)
CO2: 25 mmol/L (ref 22–32)
Calcium: 9.3 mg/dL (ref 8.9–10.3)
Chloride: 100 mmol/L (ref 98–111)
Creatinine, Ser: 0.9 mg/dL (ref 0.44–1.00)
GFR calc Af Amer: >60 mL/min (ref >60)
GFR calc non Af Amer: >60 mL/min (ref >60)
Glucose, Bld: 153 mg/dL — ABNORMAL HIGH (ref 70–99)
Potassium: 3.5 mmol/L (ref 3.5–5.1)
Sodium: 137 mmol/L (ref 135–145)
Total Bilirubin: 0.7 mg/dL (ref 0.3–1.2)
Total Protein: 7.6 g/dL (ref 6.5–8.1)

## 2019-07-31 LAB — SEDIMENTATION RATE: Sed Rate: 47 mm/hr — ABNORMAL HIGH (ref 0–30)

## 2019-07-31 IMAGING — US US EXTREM LOW VENOUS*L*
1 series · 13 of 24 positions shown · non-contrast
Comparison: None.

CLINICAL DATA: Left calf pain and edema.



[Series 1: us extrem low venous*left* · 13 of 31 slices shown]
[im 1/31]
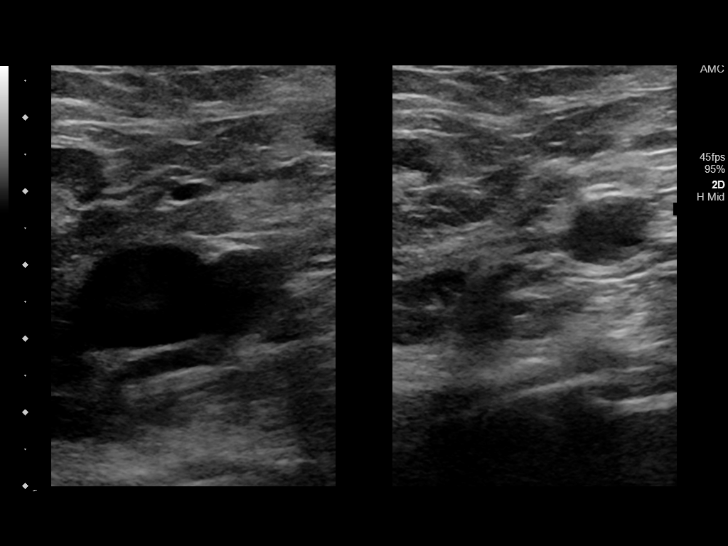
[im 3/31]
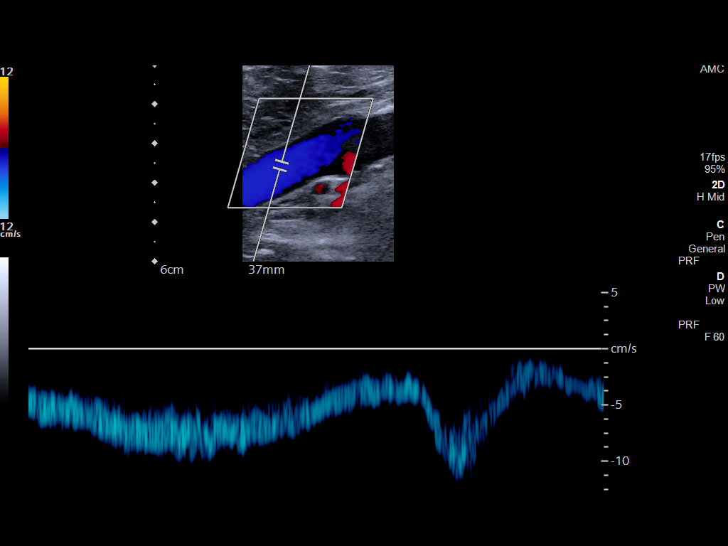
[im 6/31]
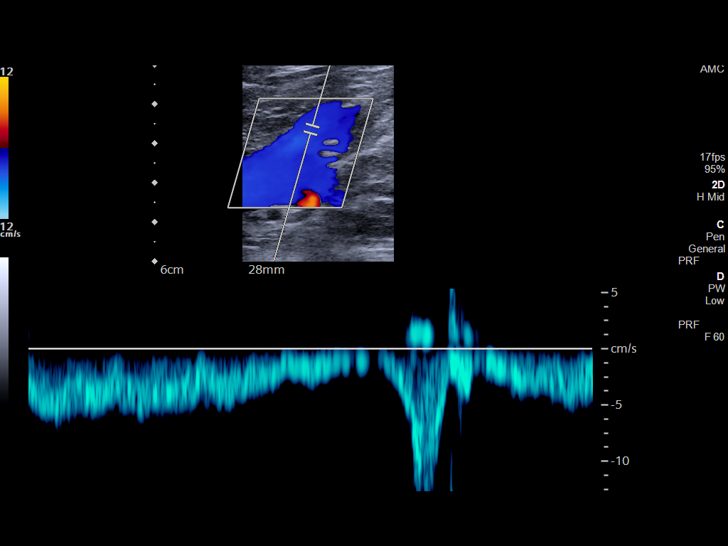
[im 8/31]
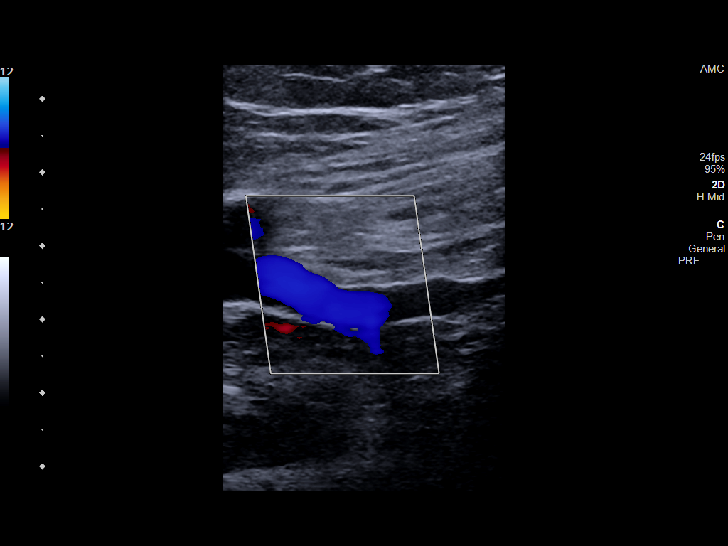
[im 11/31]
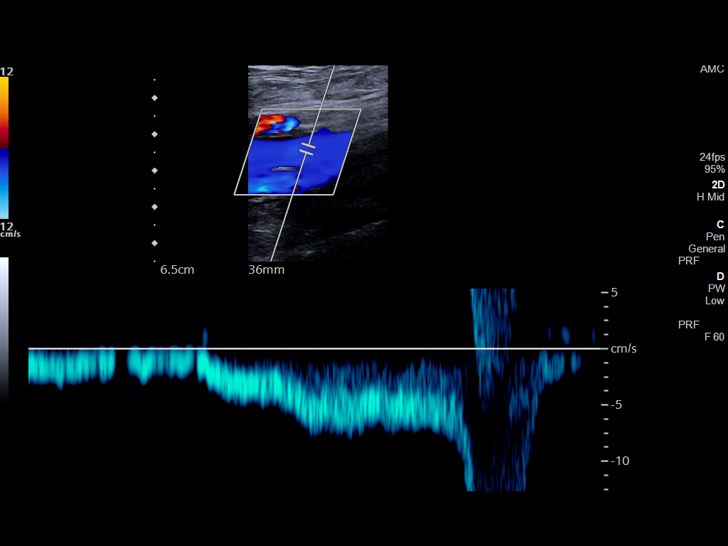
[im 14/31]
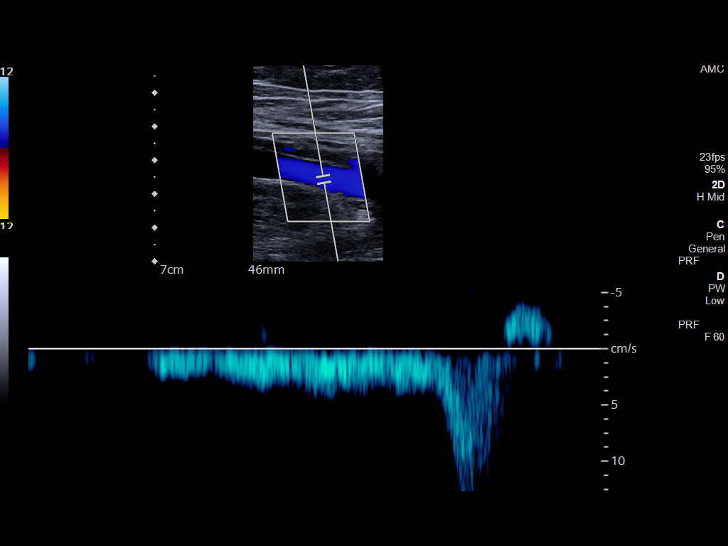
[im 16/31]
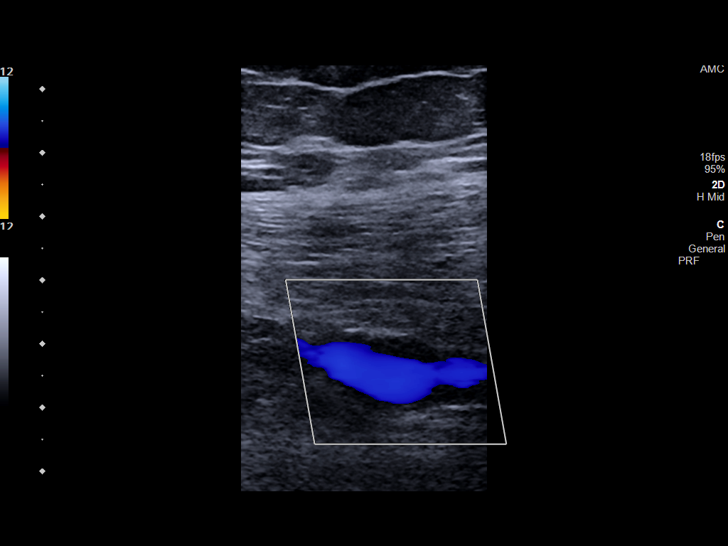
[im 17/31]
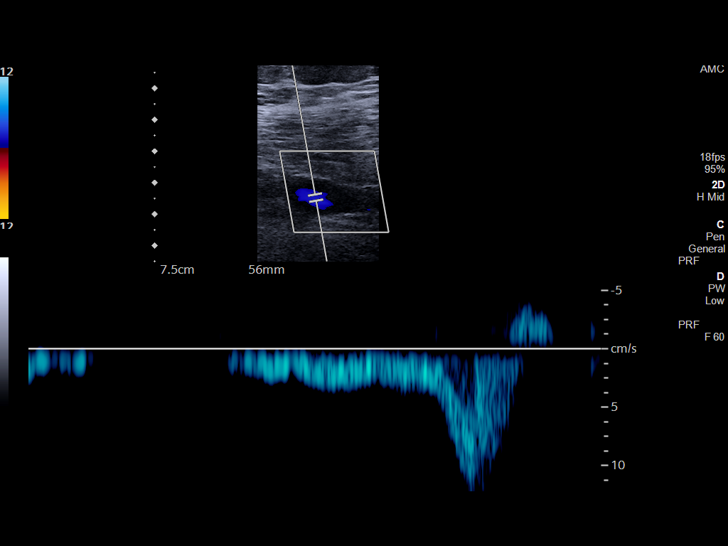
[im 20/31]
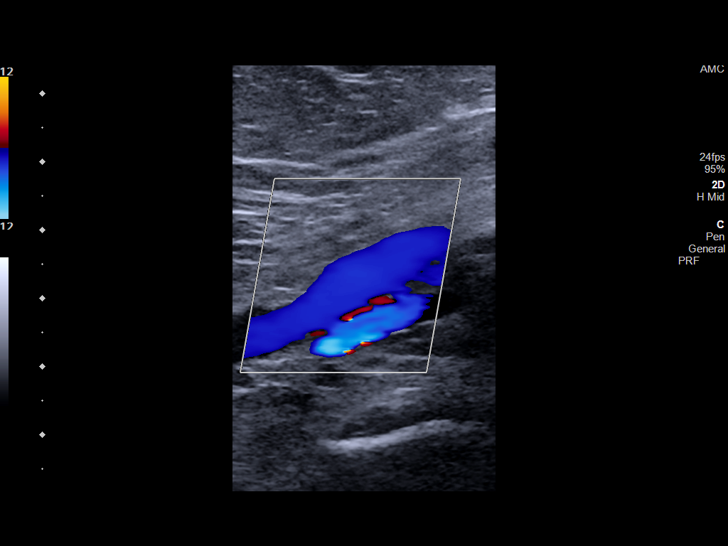
[im 23/31]
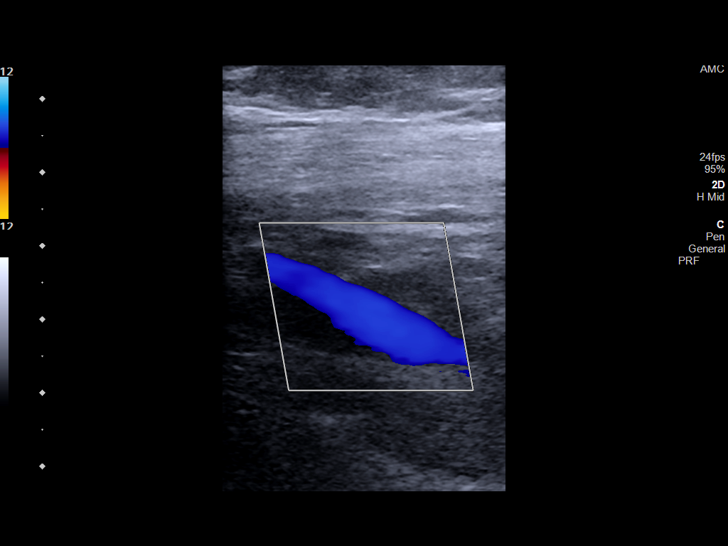
[im 25/31]
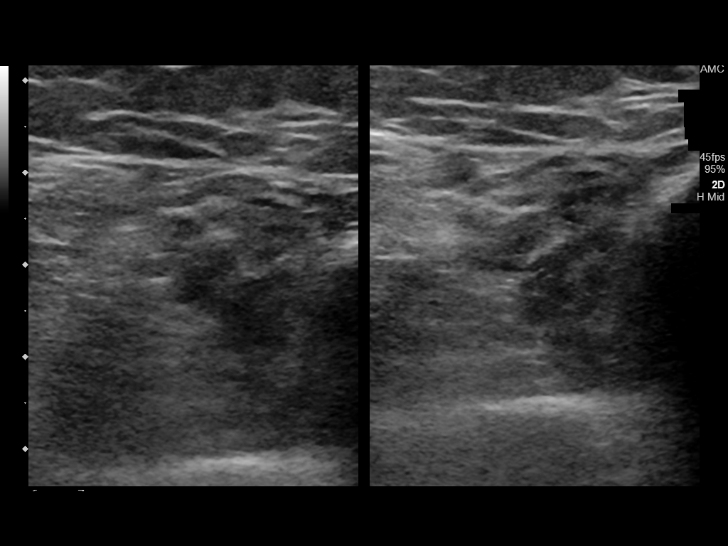
[im 28/31]
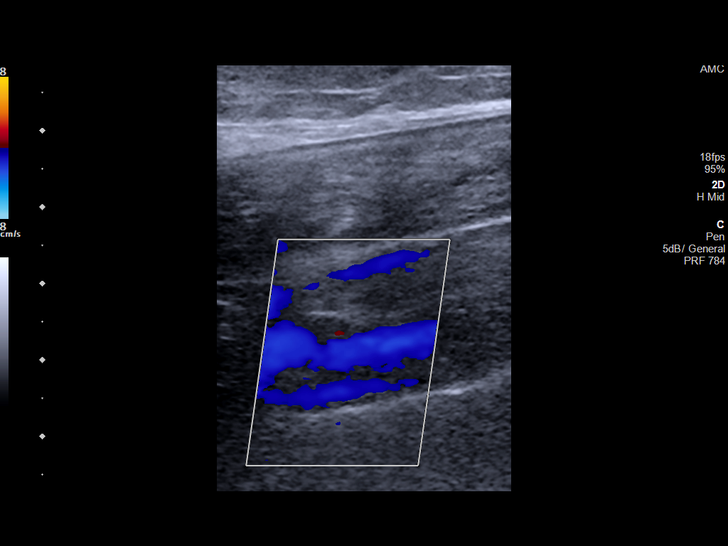
[im 31/31]
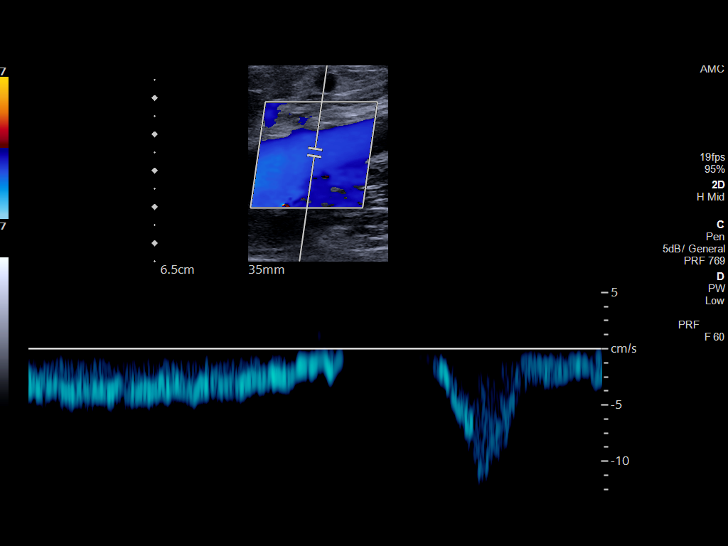

[13 of 24 positions shown; findings below may reference images not displayed]

FINDINGS: Contralateral Common Femoral Vein: Respiratory phasicity is normal
and symmetric with the symptomatic side. No evidence of thrombus.
Normal compressibility.

Common Femoral Vein: No evidence of thrombus. Normal
compressibility, respiratory phasicity and response to augmentation.

Saphenofemoral Junction: No evidence of thrombus. Normal
compressibility and flow on color Doppler imaging.

Profunda Femoral Vein: No evidence of thrombus. Normal
compressibility and flow on color Doppler imaging.

Femoral Vein: No evidence of thrombus. Normal compressibility,
respiratory phasicity and response to augmentation.

Popliteal Vein: No evidence of thrombus. Normal compressibility,
respiratory phasicity and response to augmentation.

Calf Veins: No evidence of thrombus. Normal compressibility and flow
on color Doppler imaging.

Superficial Great Saphenous Vein: No evidence of thrombus. Normal
compressibility.

Venous Reflux:  None.

Other Findings: No evidence of superficial thrombophlebitis or
abnormal fluid collection.
IMPRESSION: No evidence of left lower extreme deep venous thrombosis.

## 2019-07-31 MED ORDER — KETOROLAC TROMETHAMINE 30 MG/ML IJ SOLN
15.0000 mg | Freq: Once | INTRAMUSCULAR | Status: AC
  Administered 2019-07-31: 15 mg via INTRAVENOUS
  Filled 2019-07-31: qty 1

## 2019-07-31 MED ORDER — TRAMADOL HCL 50 MG PO TABS: 50.0000 mg | ORAL_TABLET | Freq: Four times a day (QID) | ORAL | 0 refills | Status: DC | PRN

## 2019-07-31 NOTE — ED Provider Notes (Signed)
[facility] Emergency Department Provider Note   ____________________________________________    I have reviewed the triage vital signs and the nursing notes.   HISTORY  Chief Complaint Left shoulder pain, left calf pain    HPI Chelsea Glass is a 63 y.o. female with a history of diabetes and hypertension who presents with complaints of left shoulder pain for just over 1 month which has gradually gotten worse.  She also complains of left calf pain which is worse with ambulation.  She has not been take anything for this.  She was hoping it would get better.  She denies injury to the area.  No falls.  No cough shortness of breath or myalgias reported. no history of blood clots.  Past Medical History:  Diagnosis Date  . Arthritis   . Diabetes mellitus without complication (HCC)   . Hypertension     There are no active problems to display for this patient.   Past Surgical History:  Procedure Laterality Date  . TUBAL LIGATION    . UTERINE FIBROID SURGERY      Prior to Admission medications   Medication Sig Start Date End Date Taking? Authorizing Provider  aspirin 81 MG EC tablet Take 1 tablet (81 mg total) by mouth daily. Swallow whole. 02/22/17   Emily Filbert, MD  carvedilol (COREG) 12.5 MG tablet Take 1 tablet (12.5 mg total) by mouth 2 (two) times daily with a meal. Patient not taking: Reported on 10/29/2018 09/22/16   Darci Current, MD  chlorpheniramine-HYDROcodone [facility] PENNKINETIC ER) 10-8 MG/5ML SUER Take 5 mLs by mouth 2 (two) times daily. 10/29/18   Emily Filbert, MD  chlorthalidone (HYGROTON) 50 MG tablet Take 50 mg by mouth daily. 09/25/18   [provider]  diazepam (VALIUM) 5 MG tablet Take 1 tablet (5 mg total) by mouth every 8 (eight) hours as needed (for vertigo symptoms). 02/22/17   Emily Filbert, MD  diphenhydrAMINE (BENADRYL) 25 mg capsule Take 1 capsule (25 mg total) by mouth every 4 (four)  hours as needed. 08/01/16 08/01/17  Emily Filbert, MD  lovastatin (MEVACOR) 20 MG tablet Take 20 mg by mouth daily. 06/21/09   [provider]  metFORMIN (GLUCOPHAGE) 500 MG tablet Take 1,000 mg by mouth daily. 06/21/09   [provider]  potassium chloride (K-DUR) 10 MEQ tablet Take 1 tablet (10 mEq total) by mouth daily. 10/29/18   Emily Filbert, MD  traMADol (ULTRAM) 50 MG tablet Take 1 tablet (50 mg total) by mouth every 6 (six) hours as needed. 07/31/19 07/30/20  Jene Every, MD     Allergies Lisinopril  History reviewed. No pertinent family history.  Social History Social History   Tobacco Use  . Smoking status: Never Smoker  . Smokeless tobacco: Never Used  Substance Use Topics  . Alcohol use: No    Frequency: Never  . Drug use: No    Review of Systems  Constitutional: Denies a history of fevers or chills Eyes: No visual changes.  ENT: No neck pain Cardiovascular: Denies chest pain. Respiratory: Denies shortness of breath. Gastrointestinal: No abdominal pain. Genitourinary: Negative for dysuria. Musculoskeletal: Left calf pain, left shoulder pain as above Skin: Negative for rash. Neurological: Negative for headaches   ____________________________________________   PHYSICAL EXAM:  VITAL SIGNS: ED Triage Vitals  Enc Vitals Group     BP 07/31/19 0930 (!) 181/92     Pulse Rate 07/31/19 0930 (!) 118     Resp  07/31/19 0930 16     Temp 07/31/19 0930 100.3 F (37.9 C)     Temp Source 07/31/19 0930 Oral     SpO2 07/31/19 0930 97 %     Weight 07/31/19 0935 136.1 kg (300 lb)     Height 07/31/19 0935 1.651 m (5\' 5" )     Head Circumference --      Peak Flow --      Pain Score 07/31/19 0935 10     Pain Loc --      Pain Edu? --      Excl. in GC? --     Constitutional: Alert and oriented.   Nose: No congestion/rhinnorhea. Mouth/Throat: Mucous membranes are moist.   Neck:  Painless ROM Cardiovascular: Normal rate, regular  rhythm. Grossly normal heart sounds.  Good peripheral circulation. Respiratory: Normal respiratory effort.  No retractions. Lungs CTAB. Gastrointestinal: Soft and nontender. No distention.  Genitourinary: deferred Musculoskeletal: Left shoulder: No swelling or redness, pain with abduction but full range of motion with some pain.  No evidence of infection.  Left calf, no swelling, mild tenderness posteriorly, no erythema or evidence of infection, well perfused Neurologic:  Normal speech and language. No gross focal neurologic deficits are appreciated.  Skin:  Skin is warm, dry and intact. No rash noted. Psychiatric: Mood and affect are normal. Speech and behavior are normal.  ____________________________________________   LABS (all labs ordered are listed, but only abnormal results are displayed)  Labs Reviewed  CBC WITH DIFFERENTIAL/PLATELET - Abnormal; Notable for the following components:      Result Value   WBC 11.3 (*)    MCH 25.0 (*)    Monocytes Absolute 1.3 (*)    Abs Immature Granulocytes 0.10 (*)    All other components within normal limits  COMPREHENSIVE METABOLIC PANEL - Abnormal; Notable for the following components:   Glucose, Bld 153 (*)    All other components within normal limits  SEDIMENTATION RATE - Abnormal; Notable for the following components:   Sed Rate 47 (*)    All other components within normal limits  SARS CORONAVIRUS 2 (TAT 6-24 HRS)   ____________________________________________  EKG  None____________________________________________  RADIOLOGY  Ultrasound negative for DVT ____________________________________________   PROCEDURES  Procedure(s) performed: No  Procedures   Critical Care performed: No ____________________________________________   INITIAL IMPRESSION / ASSESSMENT AND PLAN / ED COURSE  Pertinent labs & imaging results that were available during my care of the patient were reviewed by me and considered in my medical decision  making (see chart for details).  Patient well-appearing and in no acute distress, with chronic pain in the left shoulder and left calf.  Mildly elevated temperature but afebrile, vital signs otherwise reassuring.  No evidence of infection to the shoulder or calf, concern for possible DVT given 1 month of pain in her left calf.  Ultrasound obtained which was negative.  Lab work is overall reassuring, nonspecific minimal elevation in white blood cell count.  Patient treated with IV Toradol with significant improvement in pain.  Suspect musculoskeletal pain, possible rotator cuff injury to the left shoulder have asked her to follow-up closely with orthopedics.  Any worsening of symptoms or new symptoms including fever chills nausea or vomiting to return to the emergency department    ____________________________________________   FINAL CLINICAL IMPRESSION(S) / ED DIAGNOSES  Final diagnoses:  Acute pain of left shoulder  Pain of left calf        Note:  This document was prepared using Dragon  voice recognition software and may include unintentional dictation errors.   Lavonia Drafts, MD 07/31/19 [phone]

## 2019-07-31 NOTE — ED Notes (Signed)
Patient transported to Ultrasound 

## 2019-07-31 NOTE — ED Triage Notes (Signed)
Patient c/o left shoulder blade pain that radiates down left arm and also radiates down back to left side. Also c/o left calf pain. Denies recent injury. Reports being seen here for same X1 month ago

## 2019-07-31 NOTE — ED Notes (Signed)
First Nurse Note: Pt ambulatory into ED without difficulty c/o left shoulder pain. Pt is in NAD.

## 2019-10-15 ENCOUNTER — Other Ambulatory Visit: Payer: Self-pay | Admitting: Student

## 2019-10-15 DIAGNOSIS — Z1231 Encounter for screening mammogram for malignant neoplasm of breast: Secondary | ICD-10-CM

## 2020-01-26 ENCOUNTER — Other Ambulatory Visit: Payer: Self-pay

## 2020-01-26 ENCOUNTER — Emergency Department
Admission: EM | Admit: 2020-01-26 | Discharge: 2020-01-26 | Disposition: A | Discharge status: Home / Self Care | Payer: Medicare Other | Attending: Emergency Medicine | Admitting: Emergency Medicine

## 2020-01-26 DIAGNOSIS — Z7982 Long term (current) use of aspirin: Secondary | ICD-10-CM | POA: Diagnosis not present

## 2020-01-26 DIAGNOSIS — I10 Essential (primary) hypertension: Secondary | ICD-10-CM | POA: Insufficient documentation

## 2020-01-26 DIAGNOSIS — E119 Type 2 diabetes mellitus without complications: Secondary | ICD-10-CM | POA: Diagnosis not present

## 2020-01-26 DIAGNOSIS — M25552 Pain in left hip: Secondary | ICD-10-CM

## 2020-01-26 DIAGNOSIS — Z7984 Long term (current) use of oral hypoglycemic drugs: Secondary | ICD-10-CM | POA: Diagnosis not present

## 2020-01-26 DIAGNOSIS — Z79899 Other long term (current) drug therapy: Secondary | ICD-10-CM | POA: Insufficient documentation

## 2020-01-26 DIAGNOSIS — L299 Pruritus, unspecified: Secondary | ICD-10-CM | POA: Diagnosis not present

## 2020-01-26 MED ORDER — HYDROXYZINE HCL 25 MG PO TABS
25.0000 mg | ORAL_TABLET | Freq: Once | ORAL | Status: AC
  Administered 2020-01-26: 11:00:00 25 mg via ORAL
  Filled 2020-01-26: qty 1

## 2020-01-26 MED ORDER — HYDROXYZINE HCL 25 MG PO TABS: 25.0000 mg | ORAL_TABLET | Freq: Three times a day (TID) | ORAL | 0 refills | Status: DC | PRN

## 2020-01-26 MED ORDER — HYDROMORPHONE HCL 1 MG/ML IJ SOLN
0.5000 mg | Freq: Once | INTRAMUSCULAR | Status: AC
  Administered 2020-01-26: 0.5 mg via INTRAMUSCULAR
  Filled 2020-01-26: qty 1

## 2020-01-26 NOTE — ED Triage Notes (Signed)
Pt c/o chronic left hip pain that she gets injections for. Pt also c/o rash for the past 2 weeks.

## 2020-01-26 NOTE — ED Provider Notes (Signed)
[facility] Emergency Department Provider Note   ____________________________________________   First MD Initiated Contact with Patient 01/26/20 1045     (approximate)  I have reviewed the triage vital signs and the nursing notes.   HISTORY  Chief Complaint Hip Pain    HPI Chelsea Glass is a 64 y.o. female seen in patient complain of chronic hip pain which is status not relieved with the injection she normally receives.  Last injection was 12/11/2019.  Patient also complained of rash for the past 2 weeks.  Patient denies new personal hygiene or laundry products.  Patient states cannot sleep secondary to itching.         Past Medical History:  Diagnosis Date  . Arthritis   . Diabetes mellitus without complication ([place])   . Hypertension     There are no problems to display for this patient.   Past Surgical History:  Procedure Laterality Date  . TUBAL LIGATION    . UTERINE FIBROID SURGERY      Prior to Admission medications   Medication Sig Start Date End Date Taking? Authorizing Provider  aspirin 81 MG EC tablet Take 1 tablet (81 mg total) by mouth daily. Swallow whole. 02/22/17   Earleen Newport, MD  carvedilol (COREG) 12.5 MG tablet Take 1 tablet (12.5 mg total) by mouth 2 (two) times daily with a meal. Patient not taking: Reported on 10/29/2018 09/22/16   Gregor Hams, MD  chlorpheniramine-HYDROcodone [facility] PENNKINETIC ER) 10-8 MG/5ML SUER Take 5 mLs by mouth 2 (two) times daily. 10/29/18   Earleen Newport, MD  chlorthalidone (HYGROTON) 50 MG tablet Take 50 mg by mouth daily. 09/25/18   [provider]  diazepam (VALIUM) 5 MG tablet Take 1 tablet (5 mg total) by mouth every 8 (eight) hours as needed (for vertigo symptoms). 02/22/17   Earleen Newport, MD  diphenhydrAMINE (BENADRYL) 25 mg capsule Take 1 capsule (25 mg total) by mouth every 4 (four) hours as needed. 08/01/16 08/01/17  Earleen Newport, MD    hydrOXYzine (ATARAX/VISTARIL) 25 MG tablet Take 1 tablet (25 mg total) by mouth 3 (three) times daily as needed. 01/26/20   Sable Feil, PA-C  lovastatin (MEVACOR) 20 MG tablet Take 20 mg by mouth daily. 06/21/09   [provider]  metFORMIN (GLUCOPHAGE) 500 MG tablet Take 1,000 mg by mouth daily. 06/21/09   [provider]  potassium chloride (K-DUR) 10 MEQ tablet Take 1 tablet (10 mEq total) by mouth daily. 10/29/18   Earleen Newport, MD  traMADol (ULTRAM) 50 MG tablet Take 1 tablet (50 mg total) by mouth every 6 (six) hours as needed. 07/31/19 07/30/20  Lavonia Drafts, MD    Allergies Lisinopril  No family history on file.  Social History Social History   Tobacco Use  . Smoking status: Never Smoker  . Smokeless tobacco: Never Used  Substance Use Topics  . Alcohol use: No  . Drug use: No    Review of Systems Constitutional: No fever/chills Eyes: No visual changes. ENT: No sore throat. Cardiovascular: Denies chest pain. Respiratory: Denies shortness of breath. Gastrointestinal: No abdominal pain.  No nausea, no vomiting.  No diarrhea.  No constipation. Genitourinary: Negative for dysuria. Musculoskeletal: Negative for back pain. Skin: Negative for rash.  Itching. Neurological: Negative for headaches, focal weakness or numbness. Endocrine:  Diabetes and hypertension. Allergic/Immunilogical: Lisinopril. ____________________________________________   PHYSICAL EXAM:  VITAL SIGNS: ED Triage Vitals  Enc Vitals Group     BP  01/26/20 1012 (!) 162/89     Pulse Rate 01/26/20 1012 77     Resp 01/26/20 1012 17     Temp 01/26/20 1012 98.5 F (36.9 C)     Temp Source 01/26/20 1012 Oral     SpO2 01/26/20 1012 96 %     Weight 01/26/20 1011 (!) 313 lb (142 kg)     Height 01/26/20 1011 5\' 5"  (1.651 m)     Head Circumference --      Peak Flow --      Pain Score 01/26/20 1011 9     Pain Loc --      Pain Edu? --      Excl. in GC? --    Constitutional:  Alert and oriented. Well appearing and in no acute distress. Cardiovascular: Normal rate, regular rhythm. Grossly normal heart sounds.  Good peripheral circulation. Respiratory: Normal respiratory effort.  No retractions. Lungs CTAB. Gastrointestinal: Soft and nontender. No distention. No abdominal bruits. No CVA tenderness. Musculoskeletal: Patient has moderate guarding palpation of the greater trochanter and anterior knee.   Neurologic:  Normal speech and language. No gross focal neurologic deficits are appreciated. No gait instability. Skin:  Skin is warm, dry and intact. No rash noted. Psychiatric: Mood and affect are normal. Speech and behavior are normal.  ____________________________________________   LABS (all labs ordered are listed, but only abnormal results are displayed)  Labs Reviewed - No data to display ____________________________________________  EKG   ____________________________________________  RADIOLOGY  ED MD interpretation:    Official radiology report(s): No results found.  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  Patient also complained of intense itching which keeps her from sleeping at night.  Patient given discharge care instruction and advised to follow-up with family doctor.  Patient given prescription for Atarax which she is to take as needed for itching.    Chelsea Glass was evaluated in Emergency Department on 01/26/2020 for the symptoms described in the history of present illness. She was evaluated in the context of the global COVID-19 pandemic, which  necessitated consideration that the patient might be at risk for infection with the SARS-CoV-2 virus that causes COVID-19. Institutional protocols and algorithms that pertain to the evaluation of patients at risk for COVID-19 are in a state of rapid change based on information released by regulatory bodies including the CDC and federal and state organizations. These policies and algorithms were followed during the patient's care in the ED.       ____________________________________________   FINAL CLINICAL IMPRESSION(S) / ED DIAGNOSES  Final diagnoses:  Pruritus  Left hip pain     ED Discharge Orders         Ordered    hydrOXYzine (ATARAX/VISTARIL) 25 MG tablet  3 times daily PRN     01/26/20 1058           Note:  This document was prepared using Dragon voice recognition software and may include unintentional dictation errors.    01/28/20, PA-C 01/26/20 1107    01/28/20, MD 01/26/20 [phone]

## 2020-01-26 NOTE — Discharge Instructions (Signed)
Follow discharge care instruction take medication as directed.  Advised to follow-up with family doctor for chronic pain complaint.

## 2020-06-12 ENCOUNTER — Emergency Department: Payer: Medicare Other

## 2020-06-12 ENCOUNTER — Inpatient Hospital Stay
Admission: EM | Admit: 2020-06-12 | Discharge: 2020-06-27 | DRG: 871 | Disposition: A | Discharge status: Skilled Nursing Facility | Payer: Medicare Other | Attending: Family Medicine | Admitting: Family Medicine

## 2020-06-12 DIAGNOSIS — J9691 Respiratory failure, unspecified with hypoxia: Secondary | ICD-10-CM

## 2020-06-12 DIAGNOSIS — E872 Acidosis, unspecified: Secondary | ICD-10-CM

## 2020-06-12 DIAGNOSIS — I471 Supraventricular tachycardia, unspecified: Secondary | ICD-10-CM

## 2020-06-12 DIAGNOSIS — E876 Hypokalemia: Secondary | ICD-10-CM | POA: Diagnosis present

## 2020-06-12 DIAGNOSIS — R000 Tachycardia, unspecified: Secondary | ICD-10-CM

## 2020-06-12 DIAGNOSIS — N950 Postmenopausal bleeding: Secondary | ICD-10-CM | POA: Diagnosis not present

## 2020-06-12 DIAGNOSIS — Z7984 Long term (current) use of oral hypoglycemic drugs: Secondary | ICD-10-CM

## 2020-06-12 DIAGNOSIS — U071 COVID-19: Secondary | ICD-10-CM | POA: Diagnosis present

## 2020-06-12 DIAGNOSIS — R0902 Hypoxemia: Secondary | ICD-10-CM | POA: Diagnosis not present

## 2020-06-12 DIAGNOSIS — R6520 Severe sepsis without septic shock: Secondary | ICD-10-CM | POA: Diagnosis present

## 2020-06-12 DIAGNOSIS — A419 Sepsis, unspecified organism: Secondary | ICD-10-CM | POA: Diagnosis not present

## 2020-06-12 DIAGNOSIS — R7989 Other specified abnormal findings of blood chemistry: Secondary | ICD-10-CM | POA: Diagnosis present

## 2020-06-12 DIAGNOSIS — E1165 Type 2 diabetes mellitus with hyperglycemia: Secondary | ICD-10-CM | POA: Diagnosis present

## 2020-06-12 DIAGNOSIS — R079 Chest pain, unspecified: Secondary | ICD-10-CM | POA: Diagnosis not present

## 2020-06-12 DIAGNOSIS — Z6841 Body Mass Index (BMI) 40.0 and over, adult: Secondary | ICD-10-CM | POA: Diagnosis not present

## 2020-06-12 DIAGNOSIS — E079 Disorder of thyroid, unspecified: Secondary | ICD-10-CM | POA: Diagnosis present

## 2020-06-12 DIAGNOSIS — N939 Abnormal uterine and vaginal bleeding, unspecified: Secondary | ICD-10-CM

## 2020-06-12 DIAGNOSIS — I10 Essential (primary) hypertension: Secondary | ICD-10-CM | POA: Diagnosis present

## 2020-06-12 DIAGNOSIS — E66813 Obesity, class 3: Secondary | ICD-10-CM | POA: Diagnosis present

## 2020-06-12 DIAGNOSIS — A4189 Other specified sepsis: Principal | ICD-10-CM | POA: Diagnosis present

## 2020-06-12 DIAGNOSIS — F05 Delirium due to known physiological condition: Secondary | ICD-10-CM | POA: Diagnosis not present

## 2020-06-12 DIAGNOSIS — Z23 Encounter for immunization: Secondary | ICD-10-CM

## 2020-06-12 DIAGNOSIS — J1282 Pneumonia due to coronavirus disease 2019: Secondary | ICD-10-CM | POA: Diagnosis present

## 2020-06-12 DIAGNOSIS — Z888 Allergy status to other drugs, medicaments and biological substances status: Secondary | ICD-10-CM

## 2020-06-12 DIAGNOSIS — R739 Hyperglycemia, unspecified: Secondary | ICD-10-CM | POA: Diagnosis not present

## 2020-06-12 DIAGNOSIS — E785 Hyperlipidemia, unspecified: Secondary | ICD-10-CM | POA: Diagnosis present

## 2020-06-12 DIAGNOSIS — G9341 Metabolic encephalopathy: Secondary | ICD-10-CM | POA: Diagnosis present

## 2020-06-12 DIAGNOSIS — I4891 Unspecified atrial fibrillation: Secondary | ICD-10-CM | POA: Diagnosis present

## 2020-06-12 DIAGNOSIS — J9601 Acute respiratory failure with hypoxia: Secondary | ICD-10-CM | POA: Diagnosis present

## 2020-06-12 DIAGNOSIS — R9431 Abnormal electrocardiogram [ECG] [EKG]: Secondary | ICD-10-CM | POA: Diagnosis not present

## 2020-06-12 DIAGNOSIS — Z79899 Other long term (current) drug therapy: Secondary | ICD-10-CM

## 2020-06-12 LAB — COMPREHENSIVE METABOLIC PANEL
ALT: 108 U/L — ABNORMAL HIGH (ref 0–44)
AST: 79 U/L — ABNORMAL HIGH (ref 15–41)
Albumin: 3 g/dL — ABNORMAL LOW (ref 3.5–5.0)
Alkaline Phosphatase: 558 U/L — ABNORMAL HIGH (ref 38–126)
Anion gap: 17 — ABNORMAL HIGH (ref 5–15)
BUN: 25 mg/dL — ABNORMAL HIGH (ref 8–23)
CO2: 21 mmol/L — ABNORMAL LOW (ref 22–32)
Calcium: 9 mg/dL (ref 8.9–10.3)
Chloride: 98 mmol/L (ref 98–111)
Creatinine, Ser: 1.1 mg/dL — ABNORMAL HIGH (ref 0.44–1.00)
GFR calc Af Amer: >60 mL/min (ref >60)
GFR calc non Af Amer: 53 mL/min — ABNORMAL LOW (ref >60)
Glucose, Bld: 424 mg/dL — ABNORMAL HIGH (ref 70–99)
Potassium: 3.7 mmol/L (ref 3.5–5.1)
Sodium: 136 mmol/L (ref 135–145)
Total Bilirubin: 0.8 mg/dL (ref 0.3–1.2)
Total Protein: 7 g/dL (ref 6.5–8.1)

## 2020-06-12 LAB — CBC WITH DIFFERENTIAL/PLATELET
Abs Immature Granulocytes: 0.06 10*3/uL (ref 0.00–0.07)
Basophils Absolute: 0 10*3/uL (ref 0.0–0.1)
Basophils Relative: 1 %
Eosinophils Absolute: 0 10*3/uL (ref 0.0–0.5)
Eosinophils Relative: 0 %
HCT: 47.5 % — ABNORMAL HIGH (ref 36.0–46.0)
Hemoglobin: 15.4 g/dL — ABNORMAL HIGH (ref 12.0–15.0)
Immature Granulocytes: 1 %
Lymphocytes Relative: 32 %
Lymphs Abs: 1.8 10*3/uL (ref 0.7–4.0)
MCH: 27.3 pg (ref 26.0–34.0)
MCHC: 32.4 g/dL (ref 30.0–36.0)
MCV: 84.2 fL (ref 80.0–100.0)
Monocytes Absolute: 1 10*3/uL (ref 0.1–1.0)
Monocytes Relative: 17 %
Neutro Abs: 2.7 10*3/uL (ref 1.7–7.7)
Neutrophils Relative %: 49 %
Platelets: 191 10*3/uL (ref 150–400)
RBC: 5.64 MIL/uL — ABNORMAL HIGH (ref 3.87–5.11)
RDW: 13.9 % (ref 11.5–15.5)
WBC: 5.3 10*3/uL (ref 4.0–10.5)
nRBC: 0 % (ref 0.0–0.2)

## 2020-06-12 LAB — URINALYSIS, COMPLETE (UACMP) WITH MICROSCOPIC
Bilirubin Urine: NEGATIVE
Glucose, UA: >=500 mg/dL — AB
Ketones, ur: 5 mg/dL — AB
Leukocytes,Ua: NEGATIVE
Nitrite: NEGATIVE
Protein, ur: >=300 mg/dL — AB
Specific Gravity, Urine: 1.031 — ABNORMAL HIGH (ref 1.005–1.030)
pH: 5 (ref 5.0–8.0)

## 2020-06-12 LAB — SARS CORONAVIRUS 2 BY RT PCR (HOSPITAL ORDER, PERFORMED IN ~~LOC~~ HOSPITAL LAB): SARS Coronavirus 2: POSITIVE — AB

## 2020-06-12 LAB — TROPONIN I (HIGH SENSITIVITY)
Troponin I (High Sensitivity): 98 ng/L — ABNORMAL HIGH (ref <18)
Troponin I (High Sensitivity): 89 ng/L — ABNORMAL HIGH (ref <18)

## 2020-06-12 LAB — MAGNESIUM: Magnesium: 1.9 mg/dL (ref 1.7–2.4)

## 2020-06-12 LAB — GLUCOSE, CAPILLARY: Glucose-Capillary: 419 mg/dL — ABNORMAL HIGH (ref 70–99)

## 2020-06-12 LAB — LACTIC ACID, PLASMA: Lactic Acid, Venous: 2.8 mmol/L (ref 0.5–1.9)

## 2020-06-12 IMAGING — CT CT HEAD W/O CM
3 series · 16 of 47 positions shown, 19 images · non-contrast
Comparison: None.

CLINICAL DATA: Mental status change.

EXAM:
CT HEAD WITHOUT CONTRAST
TECHNIQUE: Contiguous axial images were obtained from the base of the skull
through the vertex without intravenous contrast.

[Series 2: head wo · axial · 0.43mm/px · z∈[-141,-16]mm · 10 of 30 slices shown, 13 images]
[im 3/30  brain]
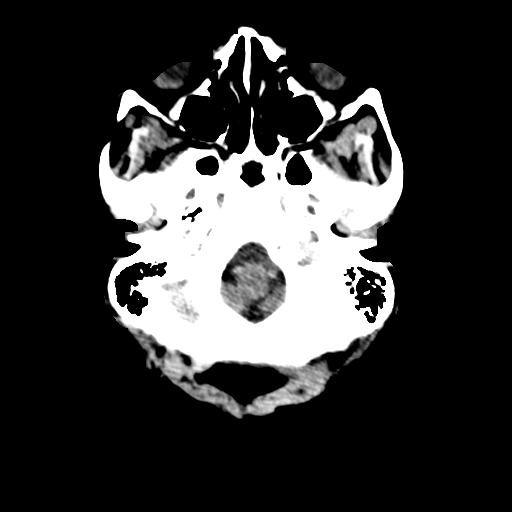
[im 3/30  bone]
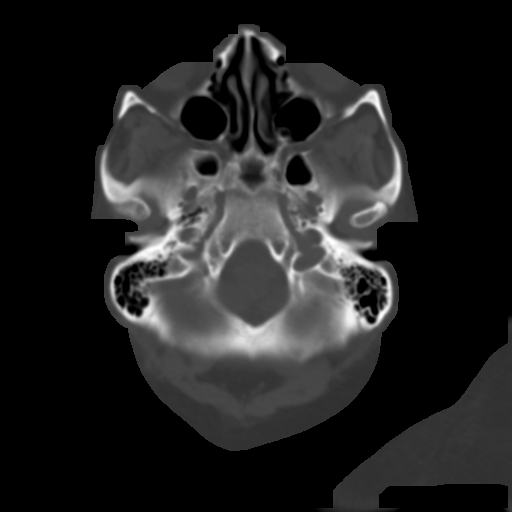
[im 6/30  brain]
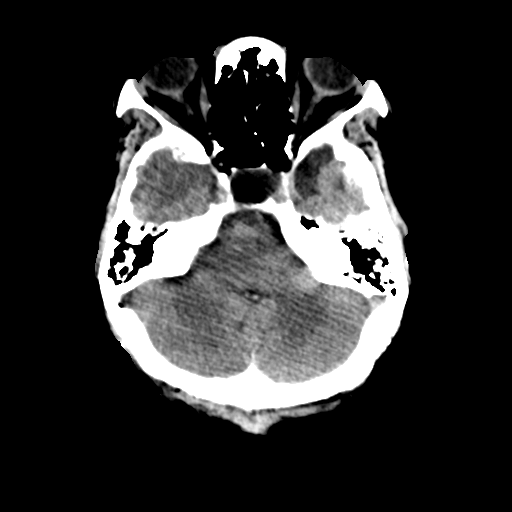
[im 9/30  brain]
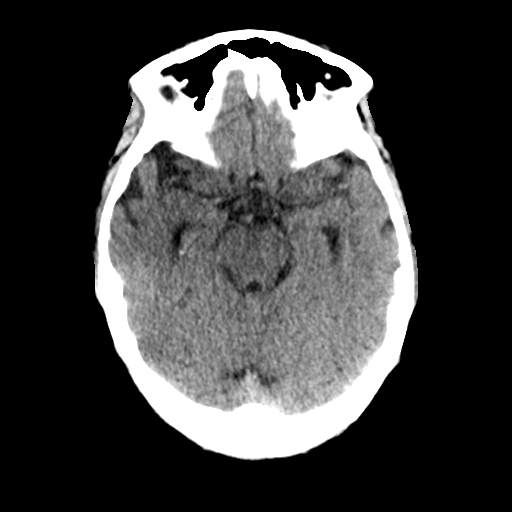
[im 11/30  brain]
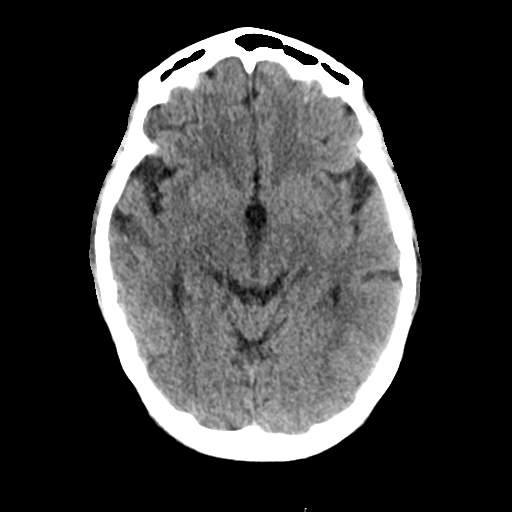
[im 14/30  brain]
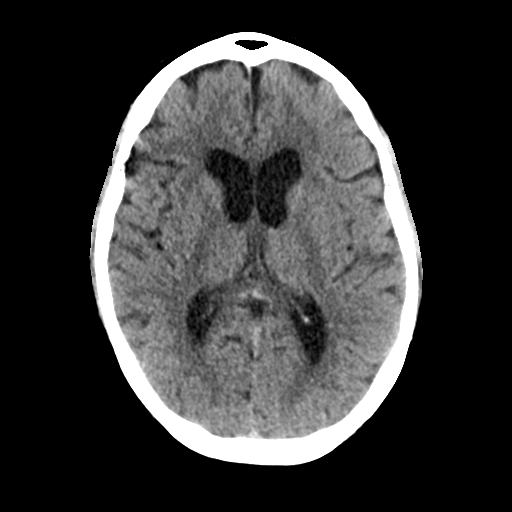
[im 14/30  bone]
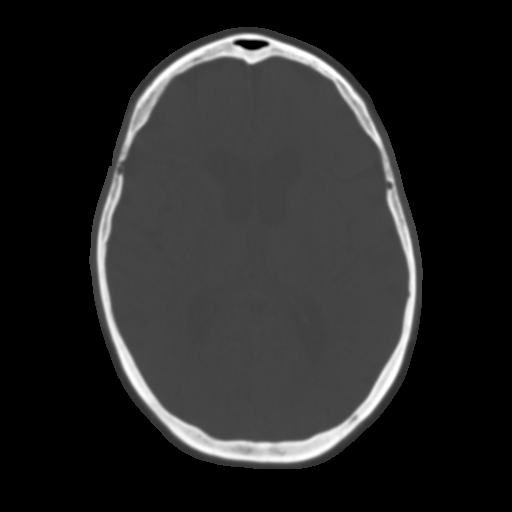
[im 17/30  brain]
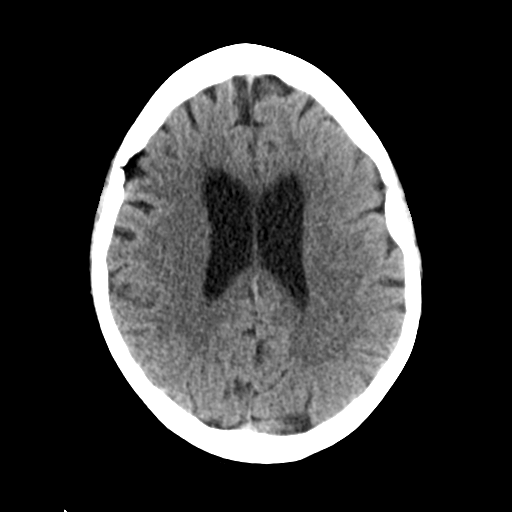
[im 20/30  brain]
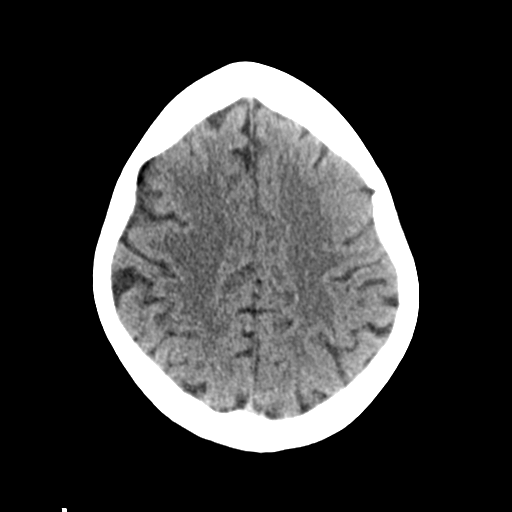
[im 23/30  brain]
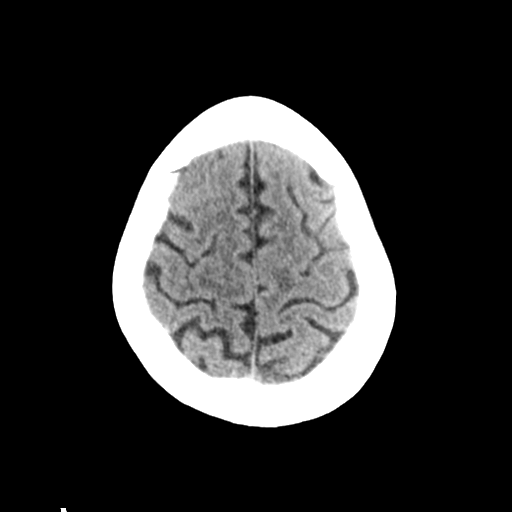
[im 25/30  brain]
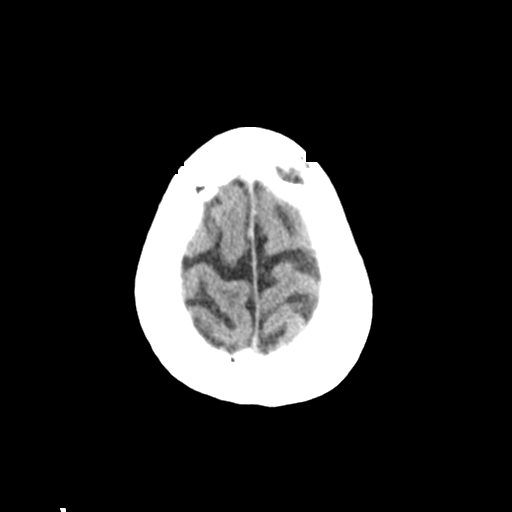
[im 25/30  bone]
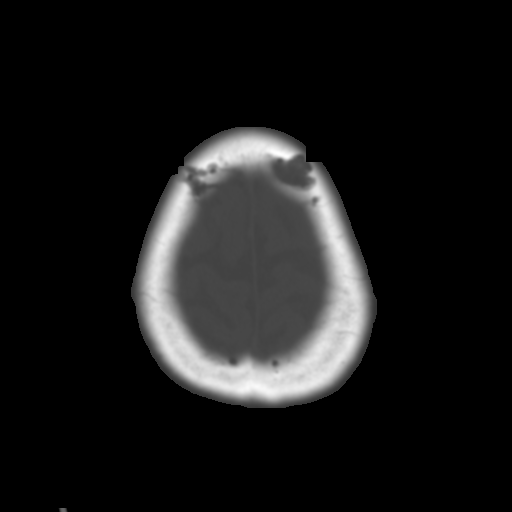
[im 28/30  brain]
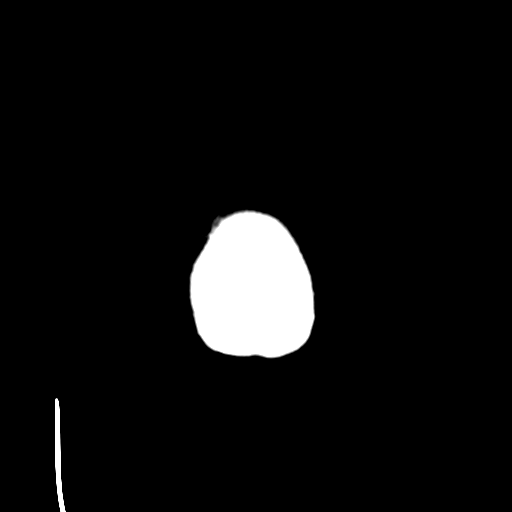

[Series 4: coronal soft tissue · coronal · 0.30mm/px · 3 of 67 slices shown]
[im 23/67  brain]
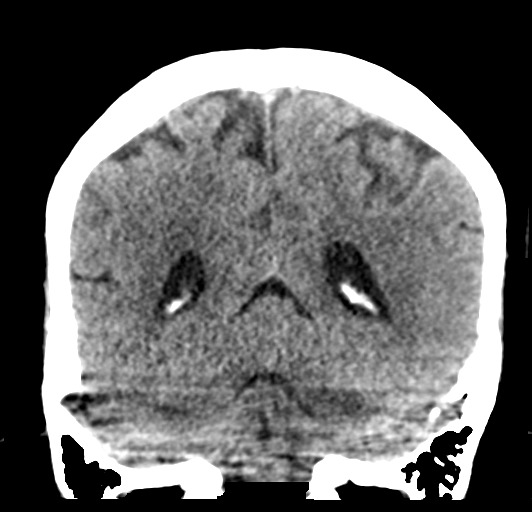
[im 30/67  brain]
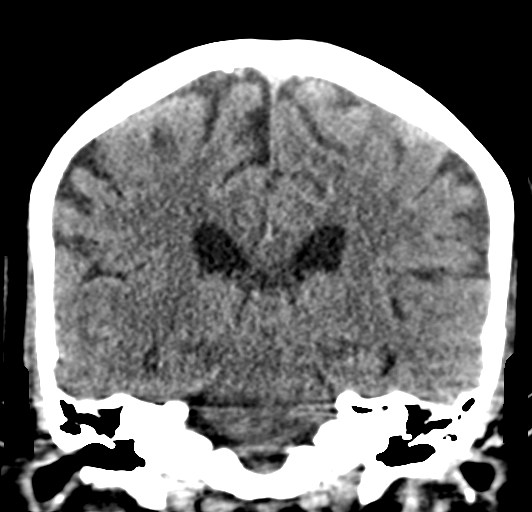
[im 37/67  brain]
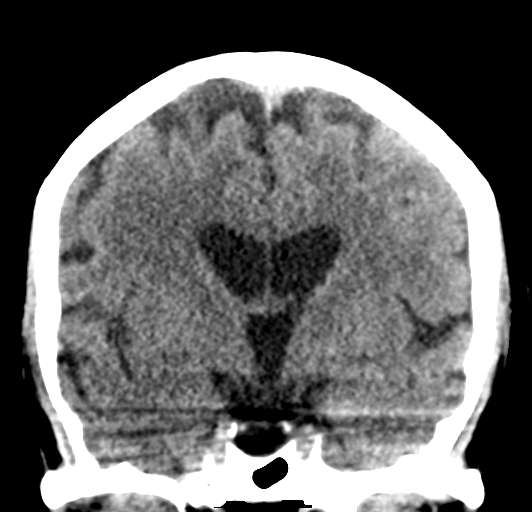

[Series 5: sagittal soft tissue · sagittal · 0.30mm/px · 3 of 53 slices shown]
[im 18/53  brain]
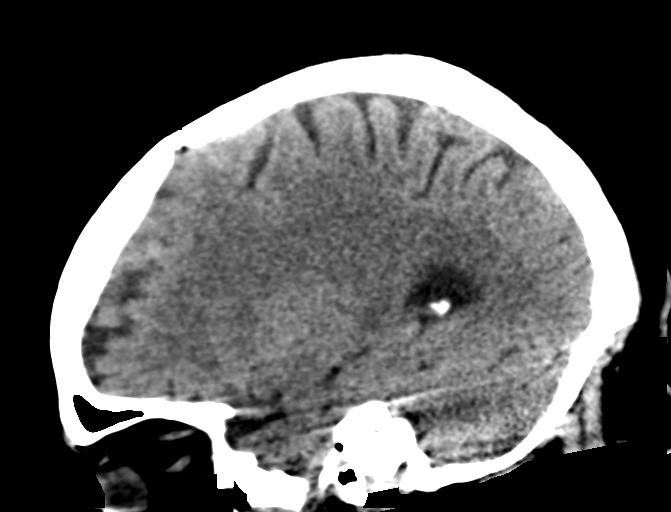
[im 27/53  brain]
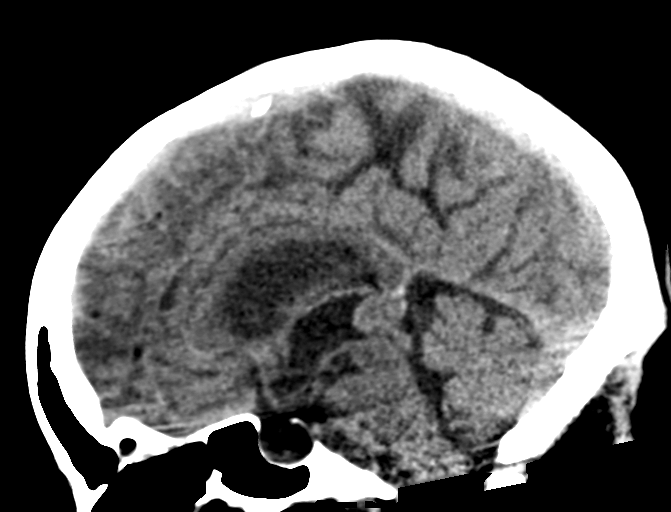
[im 35/53  brain]
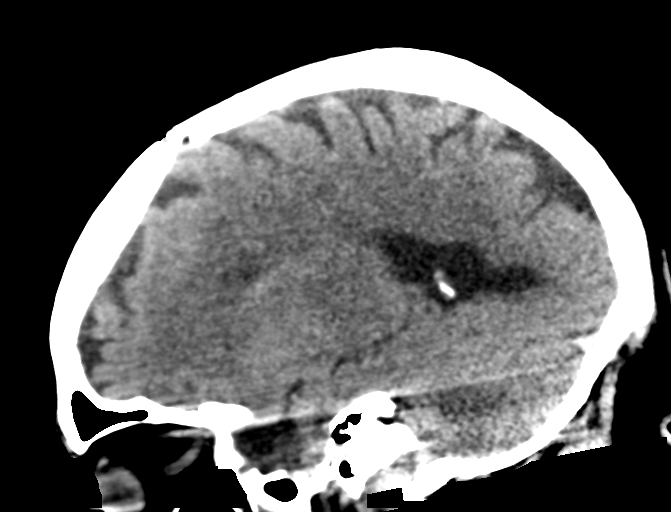

[16 of 47 positions shown; findings below may reference images not displayed]

FINDINGS: Brain: No evidence of acute infarction, hemorrhage, hydrocephalus,
extra-axial collection or mass lesion/mass effect.

Vascular: Atherosclerotic calcifications of the intra cavernous
carotid arteries.

Skull: Normal. Negative for fracture or focal lesion.

Sinuses/Orbits: No acute finding.

Other: None.
IMPRESSION: No acute intracranial abnormality.

## 2020-06-12 IMAGING — DX DG CHEST 1V PORT
1 series · 1 of 1 positions shown · non-contrast
Comparison: [DATE]

CLINICAL DATA: Tachypnea and hypoxia.  Hyperglycemia.

EXAM:
PORTABLE CHEST 1 VIEW

[chest ap]
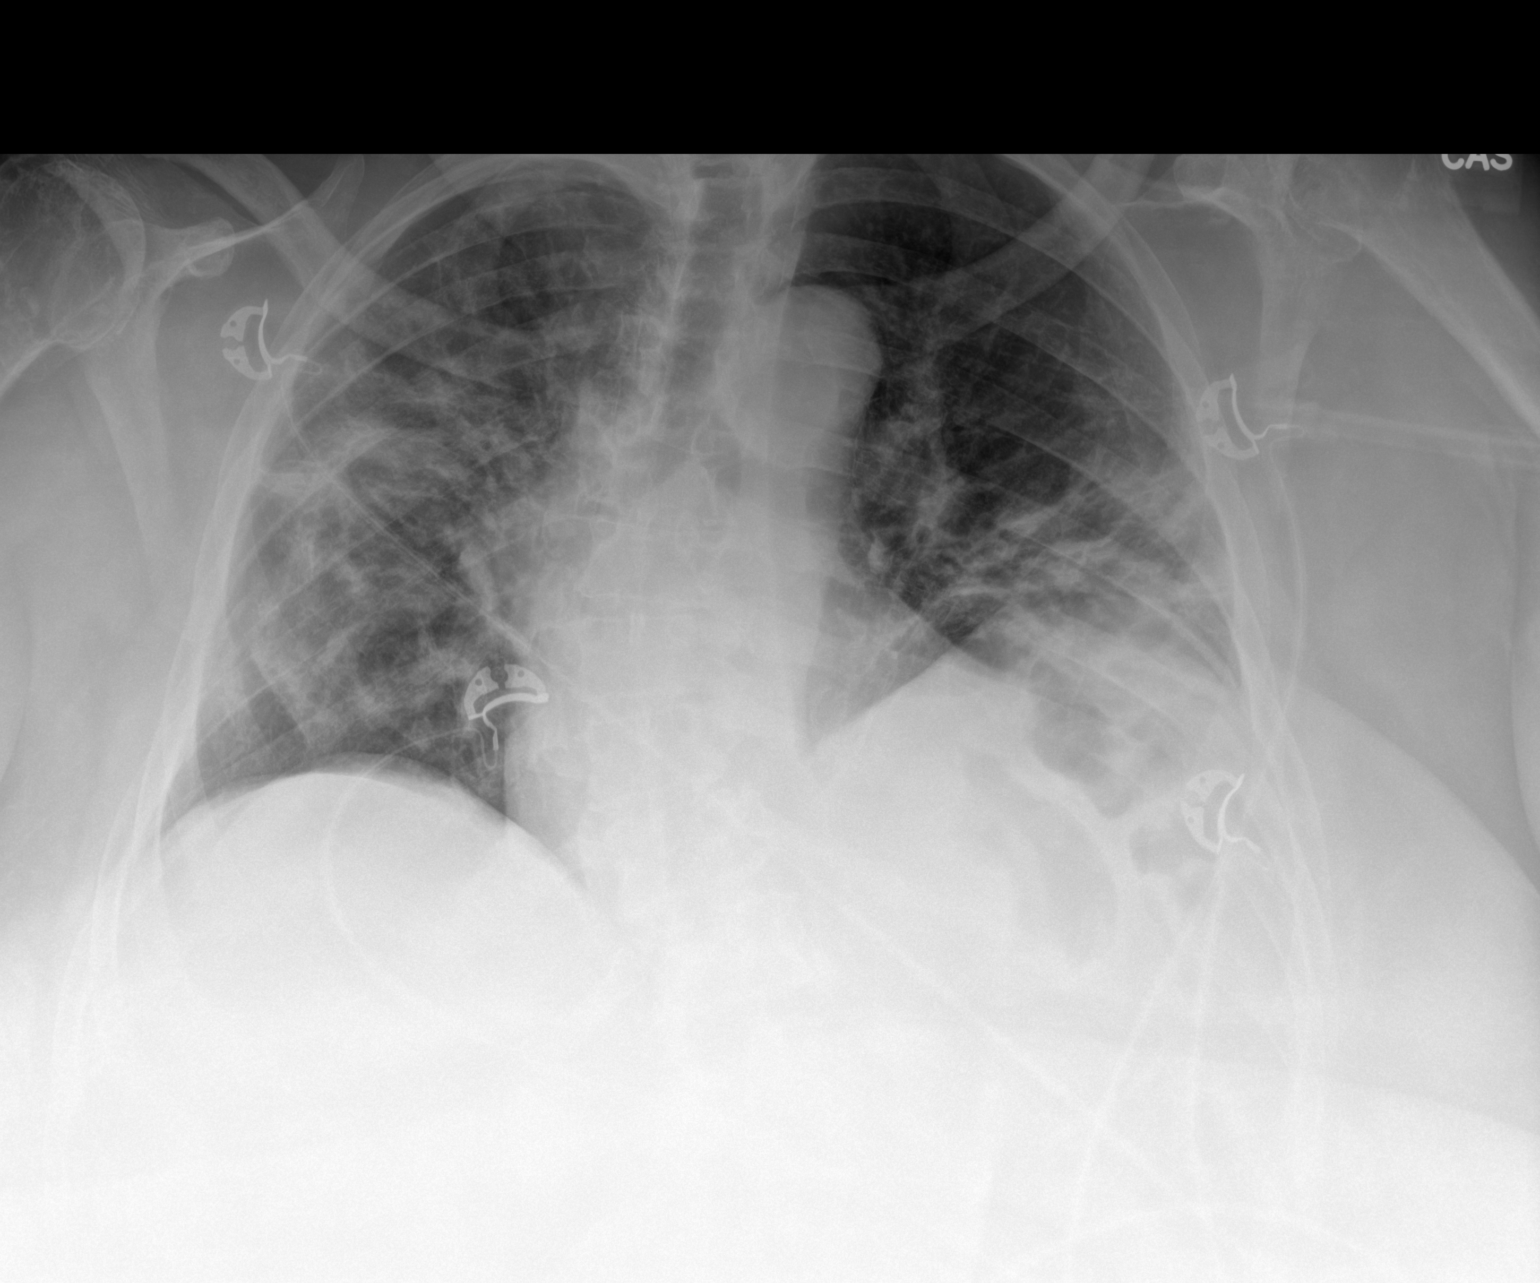

[1 of 1 positions shown; findings below may reference images not displayed]

FINDINGS: Low lung volumes are present, causing crowding of the pulmonary
vasculature. Chronic elevation of left hemidiaphragm.

Patchy abnormal airspace opacities particularly in the right mid
lung and left lung base suspicious for bilateral pneumonia.

Dextroconvex thoracic scoliosis. Heart size within normal limits
given the projection.
IMPRESSION: 1. Patchy abnormal airspace opacities in the right mid lung and left
lung base suspicious for bilateral pneumonia.
2. Low lung volumes.
3. Chronic elevation of the left hemidiaphragm.
4. Dextroconvex thoracic scoliosis.

## 2020-06-12 MED ORDER — DEXAMETHASONE SODIUM PHOSPHATE 10 MG/ML IJ SOLN
6.0000 mg | Freq: Once | INTRAMUSCULAR | Status: AC
  Administered 2020-06-12: 6 mg via INTRAVENOUS
  Filled 2020-06-12: qty 1

## 2020-06-12 MED ORDER — ONDANSETRON HCL 4 MG PO TABS: 4.0000 mg | ORAL_TABLET | Freq: Four times a day (QID) | ORAL | Status: DC | PRN

## 2020-06-12 MED ORDER — CARVEDILOL 12.5 MG PO TABS
12.5000 mg | ORAL_TABLET | Freq: Two times a day (BID) | ORAL | Status: DC
  Administered 2020-06-13 – 2020-06-27 (×26): 12.5 mg via ORAL
  Filled 2020-06-12 (×27): qty 1

## 2020-06-12 MED ORDER — INSULIN GLARGINE 100 UNIT/ML ~~LOC~~ SOLN
20.0000 [IU] | Freq: Every day | SUBCUTANEOUS | Status: DC
  Administered 2020-06-13: 20 [IU] via SUBCUTANEOUS
  Filled 2020-06-12 (×2): qty 0.2

## 2020-06-12 MED ORDER — DILTIAZEM HCL 25 MG/5ML IV SOLN
INTRAVENOUS | Status: AC
  Administered 2020-06-12: 10 mg via INTRAVENOUS
  Filled 2020-06-12: qty 5

## 2020-06-12 MED ORDER — LACTATED RINGERS IV BOLUS
1000.0000 mL | Freq: Once | INTRAVENOUS | Status: AC
  Administered 2020-06-12: 1000 mL via INTRAVENOUS

## 2020-06-12 MED ORDER — SODIUM CHLORIDE 0.9 % IV SOLN
100.0000 mg | Freq: Every day | INTRAVENOUS | Status: AC
  Administered 2020-06-13 – 2020-06-16 (×4): 100 mg via INTRAVENOUS
  Filled 2020-06-12: qty 100
  Filled 2020-06-12: qty 20
  Filled 2020-06-12 (×2): qty 100
  Filled 2020-06-12: qty 20

## 2020-06-12 MED ORDER — DILTIAZEM HCL 25 MG/5ML IV SOLN: 10.0000 mg | Freq: Once | INTRAVENOUS | Status: AC

## 2020-06-12 MED ORDER — DILTIAZEM HCL 25 MG/5ML IV SOLN
10.0000 mg | Freq: Once | INTRAVENOUS | Status: AC
  Administered 2020-06-12: 10 mg via INTRAVENOUS

## 2020-06-12 MED ORDER — SODIUM CHLORIDE 0.9 % IV SOLN
200.0000 mg | Freq: Once | INTRAVENOUS | Status: AC
  Administered 2020-06-12: 200 mg via INTRAVENOUS
  Filled 2020-06-12: qty 200

## 2020-06-12 MED ORDER — ACETAMINOPHEN 325 MG PO TABS
650.0000 mg | ORAL_TABLET | Freq: Four times a day (QID) | ORAL | Status: DC | PRN
  Administered 2020-06-15 – 2020-06-18 (×6): 650 mg via ORAL
  Filled 2020-06-12 (×8): qty 2

## 2020-06-12 MED ORDER — DILTIAZEM HCL-DEXTROSE 125-5 MG/125ML-% IV SOLN (PREMIX)
5.0000 mg/h | INTRAVENOUS | Status: DC
  Administered 2020-06-12: 5 mg/h via INTRAVENOUS
  Administered 2020-06-13: 10 mg/h via INTRAVENOUS
  Filled 2020-06-12 (×2): qty 125

## 2020-06-12 MED ORDER — INSULIN ASPART 100 UNIT/ML ~~LOC~~ SOLN
0.0000 [IU] | Freq: Three times a day (TID) | SUBCUTANEOUS | Status: DC
  Administered 2020-06-13: 15 [IU] via SUBCUTANEOUS
  Filled 2020-06-12: qty 1

## 2020-06-12 MED ORDER — ENOXAPARIN SODIUM 40 MG/0.4ML ~~LOC~~ SOLN
40.0000 mg | Freq: Two times a day (BID) | SUBCUTANEOUS | Status: DC
  Administered 2020-06-13: 40 mg via SUBCUTANEOUS
  Filled 2020-06-12: qty 0.4

## 2020-06-12 MED ORDER — DEXAMETHASONE SODIUM PHOSPHATE 10 MG/ML IJ SOLN: 6.0000 mg | Freq: Every day | INTRAMUSCULAR | Status: DC

## 2020-06-12 MED ORDER — ONDANSETRON HCL 4 MG/2ML IJ SOLN
4.0000 mg | Freq: Four times a day (QID) | INTRAMUSCULAR | Status: DC | PRN
  Administered 2020-06-15 – 2020-06-21 (×2): 4 mg via INTRAVENOUS
  Filled 2020-06-12 (×3): qty 2

## 2020-06-12 MED ORDER — INSULIN ASPART 100 UNIT/ML ~~LOC~~ SOLN: 0.0000 [IU] | Freq: Every day | SUBCUTANEOUS | Status: DC

## 2020-06-12 NOTE — ED Notes (Signed)
This RN made aware by provider that he spoke with daughter of pt and pt was diagnosed with COVID on Wednesday.

## 2020-06-12 NOTE — ED Provider Notes (Signed)
[facility] Emergency Department Provider Note ____________________________________________   First MD Initiated Contact with Patient 06/12/20 1506     (approximate)  I have reviewed the triage vital signs and the nursing notes.  HISTORY  Chief Complaint Altered Mental Status   HPI Chelsea Glass is a 64 y.o. femalewho presents to the ED for evaluation of altered mental status.   Chart review indicates history of DM on oral agents, HTN.  No documented history of A. fib.  Patient presents to the ED from home for evaluation of altered mental status.  EMS reports that family called 911 due to patient stating her bed for the past 5 days without getting out of bed.  Stooling and voiding on herself and acting confused.  EMS reports that they were called out to the house 4 days ago, but patient refused transport at that time.  History is limited because patient is unable to provide any history due to altered mental status. She denies any pain.  Reports that "I got sick."  She denies vomiting, pain.     Past Medical History:  Diagnosis Date  . Arthritis   . Diabetes mellitus without complication (H[facility])   . Hypertension     Patient Active Problem List   Diagnosis Date Noted  . Pneumonia due to COVID-19 virus 06/12/2020    Past Surgical History:  Procedure Laterality Date  . TUBAL LIGATION    . UTERINE FIBROID SURGERY      Prior to Admission medications   Medication Sig Start Date End Date Taking? Authorizing Provider  carvedilol (COREG) 12.5 MG tablet Take 1 tablet (12.5 mg total) by mouth 2 (two) times daily with a meal. 09/22/16  Yes Darci Current, MD  diphenhydrAMINE (BENADRYL) 25 mg capsule Take 1 capsule (25 mg total) by mouth every 4 (four) hours as needed. 08/01/16 06/12/20 Yes Emily Filbert, MD  meloxicam (MOBIC) 15 MG tablet Take 15 mg by mouth daily.  06/03/20  Yes [provider]  metFORMIN (GLUCOPHAGE-XR) 500 MG 24 hr  tablet Take 1,000 mg by mouth daily with breakfast.   Yes [provider]  methocarbamol (ROBAXIN) 500 MG tablet Take 500 mg by mouth 2 (two) times daily. 05/20/20  Yes [provider]  chlorthalidone (HYGROTON) 25 MG tablet Take 25 mg by mouth daily. Patient not taking: Reported on 06/12/2020    [provider]  hydrOXYzine (ATARAX/VISTARIL) 25 MG tablet Take 25 mg by mouth 3 (three) times daily as needed. Patient not taking: Reported on 06/12/2020    [provider]  metFORMIN (GLUCOPHAGE) 500 MG tablet Take 1,000 mg by mouth daily. Patient not taking: Reported on 06/12/2020 06/21/09   [provider]  spironolactone (ALDACTONE) 50 MG tablet Take 50 mg by mouth daily. Patient not taking: Reported on 06/12/2020    [provider]    Allergies Lisinopril  History reviewed. No pertinent family history.  Social History Social History   Tobacco Use  . Smoking status: Never Smoker  . Smokeless tobacco: Never Used  Substance Use Topics  . Alcohol use: No  . Drug use: No    Review of Systems  Unable to be accurately assessed due to patient's altered mental status. ____________________________________________   PHYSICAL EXAM:  VITAL SIGNS: Vitals:   06/12/20 1900 06/12/20 1924  BP: (!) 137/98 (!) 143/96  Pulse: 67 (!) 108  Resp: 18 15  Temp:  99.9 F (37.7 C)  SpO2: 95% 93%  Constitutional: Alert and oriented to self only.  Uncomfortable-appearing.  Obese.  Sitting up in bed, tachypneic to about 30.  Follows simple commands in all 4 extremities.  Frequently shifting in bed. Eyes: Conjunctivae are normal. PERRL. EOMI. Head: Atraumatic. Nose: No congestion/rhinnorhea. Mouth/Throat: Mucous membranes are dry.  Oropharynx non-erythematous. Neck: No stridor. No cervical spine tenderness to palpation. Cardiovascular: Tachycardic rate, regular rhythm. Grossly normal heart sounds.  Good peripheral circulation. Respiratory:  Tachypneic to about 30.  No retractions. Lungs CTAB.  On nasal cannula. Gastrointestinal: Soft , nondistended, nontender to palpation. No abdominal bruits. No CVA tenderness. Musculoskeletal: No lower extremity tenderness nor edema.  No joint effusions. No signs of acute trauma. Neurologic:  No gross focal neurologic deficits are appreciated.  Skin:  Skin is warm, dry and intact. No rash noted. Psychiatric: Unable to be accurately assessed due to her altered mental status  ____________________________________________   LABS (all labs ordered are listed, but only abnormal results are displayed)  Labs Reviewed  SARS CORONAVIRUS 2 BY RT PCR (HOSPITAL ORDER, PERFORMED IN [facility]) - Abnormal; Notable for the following components:      Result Value   SARS Coronavirus 2 POSITIVE (*)    All other components within normal limits  LACTIC ACID, PLASMA - Abnormal; Notable for the following components:   Lactic Acid, Venous 2.8 (*)    All other components within normal limits  URINALYSIS, COMPLETE (UACMP) WITH MICROSCOPIC - Abnormal; Notable for the following components:   Color, Urine AMBER (*)    APPearance HAZY (*)    Specific Gravity, Urine 1.031 (*)    Glucose, UA >=500 (*)    Hgb urine dipstick SMALL (*)    Ketones, ur 5 (*)    Protein, ur >=300 (*)    Bacteria, UA RARE (*)    All other components within normal limits  CBC WITH DIFFERENTIAL/PLATELET - Abnormal; Notable for the following components:   RBC 5.64 (*)    Hemoglobin 15.4 (*)    HCT 47.5 (*)    All other components within normal limits  COMPREHENSIVE METABOLIC PANEL - Abnormal; Notable for the following components:   CO2 21 (*)    Glucose, Bld 424 (*)    BUN 25 (*)    Creatinine, Ser 1.10 (*)    Albumin 3.0 (*)    AST 79 (*)    ALT 108 (*)    Alkaline Phosphatase 558 (*)    GFR calc non Af Amer 53 (*)    Anion gap 17 (*)    All other components within normal limits  TROPONIN I (HIGH SENSITIVITY) -  Abnormal; Notable for the following components:   Troponin I (High Sensitivity) 89 (*)    All other components within normal limits  TROPONIN I (HIGH SENSITIVITY) - Abnormal; Notable for the following components:   Troponin I (High Sensitivity) 98 (*)    All other components within normal limits  CULTURE, BLOOD (ROUTINE X 2)  CULTURE, BLOOD (ROUTINE X 2)  MAGNESIUM  CBC WITH DIFFERENTIAL/PLATELET  HIV ANTIBODY (ROUTINE TESTING W REFLEX)  CBC WITH DIFFERENTIAL/PLATELET  COMPREHENSIVE METABOLIC PANEL  C-REACTIVE PROTEIN  FIBRIN DERIVATIVES D-DIMER ([facility] ONLY)  FERRITIN  MAGNESIUM  PHOSPHORUS  C-REACTIVE PROTEIN  FIBRIN DERIVATIVES D-DIMER ([facility] ONLY)  FERRITIN  HEMOGLOBIN A1C   ____________________________________________  12 Lead EKG  First of the EKG at 1514 demonstrates SVT, likely A. fib with RVR with a rate of 169.  Normal axis.  Normal intervals.  No evidence  of acute ischemia.  Repeat EKG after 10 mg of IV diltiazem at 1522 demonstrates sinus tachycardia, rate of 115 bpm, normal axis and intervals.  No evidence of acute ischemia. ____________________________________________  RADIOLOGY  ED MD interpretation: CXR with patchy multifocal opacities consistent with known COVID-19 without evidence of discrete lobar filtration.  Chronic left hemidiaphragm elevation.  Official radiology report(s): CT Head Wo Contrast  Result Date: 06/12/2020 CLINICAL DATA:  Mental status change. EXAM: CT HEAD WITHOUT CONTRAST TECHNIQUE: Contiguous axial images were obtained from the base of the skull through the vertex without intravenous contrast. COMPARISON:  None. FINDINGS: Brain: No evidence of acute infarction, hemorrhage, hydrocephalus, extra-axial collection or mass lesion/mass effect. Vascular: Atherosclerotic calcifications of the intra cavernous carotid arteries. Skull: Normal. Negative for fracture or focal lesion. Sinuses/Orbits: No acute finding. Other: None. IMPRESSION: No acute  intracranial abnormality. Electronically Signed   By: Ted Mcalpine M.D.   On: 06/12/2020 17:55   DG Chest Portable 1 View  Result Date: 06/12/2020 CLINICAL DATA:  Tachypnea and hypoxia.  Hyperglycemia. EXAM: PORTABLE CHEST 1 VIEW COMPARISON:  10/29/2018 FINDINGS: Low lung volumes are present, causing crowding of the pulmonary vasculature. Chronic elevation of left hemidiaphragm. Patchy abnormal airspace opacities particularly in the right mid lung and left lung base suspicious for bilateral pneumonia. Dextroconvex thoracic scoliosis. Heart size within normal limits given the projection. IMPRESSION: 1. Patchy abnormal airspace opacities in the right mid lung and left lung base suspicious for bilateral pneumonia. 2. Low lung volumes. 3. Chronic elevation of the left hemidiaphragm. 4. Dextroconvex thoracic scoliosis. Electronically Signed   By: Gaylyn Rong M.D.   On: 06/12/2020 16:04    ____________________________________________   PROCEDURES and INTERVENTIONS  Procedure(s) performed (including Critical Care):  .1-3 Lead EKG Interpretation Performed by: Delton Prairie, MD Authorized by: Delton Prairie, MD     Interpretation: abnormal     ECG rate:  169   ECG rate assessment: tachycardic     Rhythm: SVT     Ectopy: none     Conduction: normal   .Critical Care Performed by: Delton Prairie, MD Authorized by: Delton Prairie, MD   Critical care provider statement:    Critical care time (minutes):  45   Critical care was time spent personally by me on the following activities:  Discussions with consultants, evaluation of patient's response to treatment, examination of patient, ordering and performing treatments and interventions, ordering and review of laboratory studies, ordering and review of radiographic studies, pulse oximetry, re-evaluation of patient's condition, obtaining history from patient or surrogate and review of old charts    Medications  remdesivir 200 mg in sodium  chloride 0.9% 250 mL IVPB (0 mg Intravenous Stopped 06/12/20 1812)    Followed by  remdesivir 100 mg in sodium chloride 0.9 % 100 mL IVPB (has no administration in time range)  diltiazem (CARDIZEM) 125 mg in dextrose 5% 125 mL (1 mg/mL) infusion (10 mg/hr Intravenous Rate/Dose Verify 06/12/20 1940)  carvedilol (COREG) tablet 12.5 mg (has no administration in time range)  enoxaparin (LOVENOX) injection 40 mg (has no administration in time range)  acetaminophen (TYLENOL) tablet 650 mg (has no administration in time range)  ondansetron (ZOFRAN) tablet 4 mg (has no administration in time range)    Or  ondansetron (ZOFRAN) injection 4 mg (has no administration in time range)  dexamethasone (DECADRON) injection 6 mg (has no administration in time range)  insulin aspart (novoLOG) injection 0-15 Units (has no administration in time range)  insulin aspart (novoLOG) injection 0-5  Units (has no administration in time range)  lactated ringers bolus 1,000 mL (0 mLs Intravenous Stopped 06/12/20 1723)  diltiazem (CARDIZEM) injection 10 mg (10 mg Intravenous Given 06/12/20 1523)  dexamethasone (DECADRON) injection 6 mg (6 mg Intravenous Given 06/12/20 1711)  diltiazem (CARDIZEM) injection 10 mg (10 mg Intravenous Given 06/12/20 1619)    ____________________________________________   MDM / ED COURSE  64 year old unvaccinated diabetic presented to the ED with altered mental status, likely due to home hypoxia and metabolic encephalopathy in the setting of her COVID-19, requiring medical admission.  Patient presented tachycardic to about 170 in SVT, but hemodynamically stable.  She has a new 6 L O2 requirement.  Exam demonstrates a nonfocally confused obese patient who is dyspneic and tachypneic.  Patient intermittently flipping into SVT, that appears to be A. fib with RVR, and requiring diltiazem pushes.  She responds well to these pushes with conversion back to a sinus rhythm and maintenance of normotensive blood  pressures, but she does this multiple times and necessitates a Cardizem drip.  Blood work demonstrates mild lactic acidosis, marginal elevation of troponin that is likely due to demand ischemia in the setting of her tachycardic rates of SVT.  Despite her diabetes and pre-existing hyperglycemia, provided the patient Decadron due to the severity of her COVID-19 illness and associated hypoxia.  Further provided remdesivir.  We will admit the patient to hospitalist medicine for further work-up and management of her hypoxic and altered condition.  The presenting altered mental status is likely due to a metabolic encephalopathy in the setting of her systemic illness, her CT head is no evidence of ICH, CVA or mass.   Clinical Course as of Jun 13 2115  Wynelle LinkSun Jun 12, 2020  1524 RN grabs me and tells me of tachycardia.  First twelve-lead EKG shows SVT, appears to be A. fib with RVR, rates 169-170.  Patient received 2 mg of IV Dilts with return to sinus tachycardia, next EKG with sinus tach rate of 115.  Remains normotensive   [DS]  1604 Called daughter, Deloris. Dx with covid Wednesday. Sleeping a lot and not acting herself. "body aches" all over and poor appetite. Not taking her meds.     [DS]  1615 RN informs me that patient's rhythm returns to SVT with rates in the 160s.  Additional 10 mg of IV diltiazem ordered.   [DS]  1706 Patient continues to be tachycardic in the 160s.  Normotensive.  Diltiazem drip ordered.   [DS]    Clinical Course User Index [DS] Delton PrairieSmith, Lanae Federer, MD     ____________________________________________   FINAL CLINICAL IMPRESSION(S) / ED DIAGNOSES  Final diagnoses:  Hypoxia  COVID-19  Sinus tachycardia  SVT (supraventricular tachycardia) (HCC)  Lactic acidosis  Hyperglycemia     ED Discharge Orders    None       Dorthy Hustead   Note:  This document was prepared using Dragon voice recognition software and may include unintentional dictation errors.   Delton PrairieSmith, Conroy Goracke,  MD 06/12/20 2120

## 2020-06-12 NOTE — ED Triage Notes (Signed)
Per EMS, called to pt's home d/t pt "not being herself". Family was present upon their arrival. EMS had been called 4 days prior and pt refused treatment. Per family, pt has been laying in bed for 5 days. Pt smells of old stool and urine. Pt is alert to self and place, but that's it. BG in the 400's per EMS. Provider at bedside.

## 2020-06-12 NOTE — H&P (Addendum)
History and Physical:    Chelsea Glass   RUE:[id number] DOB: [birth year] DOA: 06/12/2020  Referring MD/provider: Delton Prairieylan Smith, MD PCP: Tanna FurryZhou-Talbert, Serena S, MD   Patient coming from: Home  Chief Complaint: Altered mental status  History of Present Illness:   Chelsea EdwardDorylean Chou is a 64 y.o. female with medical history significant for hypertension, diabetes mellitus, arthritis, morbid obesity, was brought to the hospital because of altered mental status. History was obtained from ED physician and chart review. Patient is unable to provide an adequate history. Reportedly, EMS was called because patient had been laying in bed for about 5 days without getting up. Apparently, she had been laying in her feces and urine. Per report, EMS had been called to the house about 4 days prior to admission but patient had refused transport to the hospital at that time.  ED Course:  The patient tested positive for Covid. She was in rapid A. fib so she was given bolus of IV Cardizem. She was also given 1 L of Ringer's lactate. She was started on IV dexamethasone and IV remdesivir.  ROS:   ROS unable to obtain because of altered mental  Past Medical History:   Past Medical History:  Diagnosis Date  . Arthritis   . Diabetes mellitus without complication (HCC)   . Hypertension     Past Surgical History:   Past Surgical History:  Procedure Laterality Date  . TUBAL LIGATION    . UTERINE FIBROID SURGERY      Social History:   Social History   Socioeconomic History  . Marital status: Widowed    Spouse name: Not on file  . Number of children: Not on file  . Years of education: Not on file  . Highest education level: Not on file  Occupational History  . Not on file  Tobacco Use  . Smoking status: Never Smoker  . Smokeless tobacco: Never Used  Substance and Sexual Activity  . Alcohol use: No  . Drug use: No  . Sexual activity: Not on file  Other Topics Concern  . Not on file   Social History Narrative  . Not on file   Social Determinants of Health   Financial Resource Strain:   . Difficulty of Paying Living Expenses: Not on file  Food Insecurity:   . Worried About Programme researcher, broadcasting/film/videounning Out of Food in the Last Year: Not on file  . Ran Out of Food in the Last Year: Not on file  Transportation Needs:   . Lack of Transportation (Medical): Not on file  . Lack of Transportation (Non-Medical): Not on file  Physical Activity:   . Days of Exercise per Week: Not on file  . Minutes of Exercise per Session: Not on file  Stress:   . Feeling of Stress : Not on file  Social Connections:   . Frequency of Communication with Friends and Family: Not on file  . Frequency of Social Gatherings with Friends and Family: Not on file  . Attends Religious Services: Not on file  . Active Member of Clubs or Organizations: Not on file  . Attends BankerClub or Organization Meetings: Not on file  . Marital Status: Not on file  Intimate Partner Violence:   . Fear of Current or Ex-Partner: Not on file  . Emotionally Abused: Not on file  . Physically Abused: Not on file  . Sexually Abused: Not on file    Allergies   Lisinopril  Family history:   History reviewed. No pertinent family history.  Current Medications:   Prior to Admission medications   Medication Sig Start Date End Date Taking? Authorizing Provider  carvedilol (COREG) 12.5 MG tablet Take 1 tablet (12.5 mg total) by mouth 2 (two) times daily with a meal. 09/22/16  Yes Darci Current, MD  diphenhydrAMINE (BENADRYL) 25 mg capsule Take 1 capsule (25 mg total) by mouth every 4 (four) hours as needed. 08/01/16 06/12/20 Yes Emily Filbert, MD  meloxicam (MOBIC) 15 MG tablet Take 15 mg by mouth daily.  06/03/20  Yes [provider]  metFORMIN (GLUCOPHAGE-XR) 500 MG 24 hr tablet Take 1,000 mg by mouth daily with breakfast.   Yes [provider]  methocarbamol (ROBAXIN) 500 MG tablet Take 500 mg by mouth 2 (two) times  daily. 05/20/20  Yes [provider]  chlorthalidone (HYGROTON) 25 MG tablet Take 25 mg by mouth daily. Patient not taking: Reported on 06/12/2020    [provider]  hydrOXYzine (ATARAX/VISTARIL) 25 MG tablet Take 25 mg by mouth 3 (three) times daily as needed. Patient not taking: Reported on 06/12/2020    [provider]  metFORMIN (GLUCOPHAGE) 500 MG tablet Take 1,000 mg by mouth daily. Patient not taking: Reported on 06/12/2020 06/21/09   [provider]  spironolactone (ALDACTONE) 50 MG tablet Take 50 mg by mouth daily. Patient not taking: Reported on 06/12/2020    [provider]    Physical Exam:   Vitals:   06/12/20 1700 06/12/20 1745 06/12/20 1816 06/12/20 1819  BP: 134/86 116/85    Pulse: (!) 116 (!) 111 (!) 104 79  Resp: (!) 27 (!) 26 16 (!) 23  Temp:      TempSrc:      SpO2: (!) 86% 97% 92% 98%     Physical Exam: Blood pressure 116/85, pulse 79, temperature 100 F (37.8 C), temperature source Rectal, resp. rate (!) 23, SpO2 98 %. Gen: No acute distress. Head: Normocephalic, atraumatic. Eyes: Pupils equal, round and reactive to light. Extraocular movements intact.  Sclerae nonicteric.  Mouth: Moist mucous membranes Neck: Supple, no thyromegaly, no lymphadenopathy, no jugular venous distention. Chest: Lungs are clear to auscultation with good air movement. No rales, rhonchi or wheezes.  CV: Heart sounds are regular with an S1, S2. Sinus tachycardia. No murmurs, rubs or gallops.  Abdomen: Soft, nontender, obese with normal active bowel sounds. No palpable masses. Extremities: Extremities are without clubbing, or cyanosis. No edema. Pedal pulses 2+.  Skin: Warm and dry. No rashes, lesions or wounds Neuro: Alert and oriented times 3; grossly nonfocal.  Psych: Insight is good and judgment is appropriate. Mood and affect normal.   Data Review:    Labs: Basic Metabolic Panel: Recent Labs  Lab 06/12/20 1551  NA 136  K 3.7  CL 98   CO2 21*  GLUCOSE 424*  BUN 25*  CREATININE 1.10*  CALCIUM 9.0  MG 1.9   Liver Function Tests: Recent Labs  Lab 06/12/20 1551  AST 79*  ALT 108*  ALKPHOS 558*  BILITOT 0.8  PROT 7.0  ALBUMIN 3.0*   No results for input(s): LIPASE, AMYLASE in the last 168 hours. No results for input(s): AMMONIA in the last 168 hours. CBC: Recent Labs  Lab 06/12/20 1551  WBC 5.3  NEUTROABS 2.7  HGB 15.4*  HCT 47.5*  MCV 84.2  PLT 191   Cardiac Enzymes: No results for input(s): CKTOTAL, CKMB, CKMBINDEX, TROPONINI in the last 168 hours.  BNP (last 3 results) No results for input(s): PROBNP in the  last 8760 hours. CBG: No results for input(s): GLUCAP in the last 168 hours.  Urinalysis    Component Value Date/Time   COLORURINE AMBER (A) 06/12/2020 1521   APPEARANCEUR HAZY (A) 06/12/2020 1521   LABSPEC 1.031 (H) 06/12/2020 1521   PHURINE 5.0 06/12/2020 1521   GLUCOSEU >=500 (A) 06/12/2020 1521   HGBUR SMALL (A) 06/12/2020 1521   BILIRUBINUR NEGATIVE 06/12/2020 1521   KETONESUR 5 (A) 06/12/2020 1521   PROTEINUR >=300 (A) 06/12/2020 1521   NITRITE NEGATIVE 06/12/2020 1521   LEUKOCYTESUR NEGATIVE 06/12/2020 1521      Radiographic Studies: CT Head Wo Contrast  Result Date: 06/12/2020 CLINICAL DATA:  Mental status change. EXAM: CT HEAD WITHOUT CONTRAST TECHNIQUE: Contiguous axial images were obtained from the base of the skull through the vertex without intravenous contrast. COMPARISON:  None. FINDINGS: Brain: No evidence of acute infarction, hemorrhage, hydrocephalus, extra-axial collection or mass lesion/mass effect. Vascular: Atherosclerotic calcifications of the intra cavernous carotid arteries. Skull: Normal. Negative for fracture or focal lesion. Sinuses/Orbits: No acute finding. Other: None. IMPRESSION: No acute intracranial abnormality. Electronically Signed   By: Ted Mcalpine M.D.   On: 06/12/2020 17:55   DG Chest Portable 1 View  Result Date: 06/12/2020 CLINICAL  DATA:  Tachypnea and hypoxia.  Hyperglycemia. EXAM: PORTABLE CHEST 1 VIEW COMPARISON:  10/29/2018 FINDINGS: Low lung volumes are present, causing crowding of the pulmonary vasculature. Chronic elevation of left hemidiaphragm. Patchy abnormal airspace opacities particularly in the right mid lung and left lung base suspicious for bilateral pneumonia. Dextroconvex thoracic scoliosis. Heart size within normal limits given the projection. IMPRESSION: 1. Patchy abnormal airspace opacities in the right mid lung and left lung base suspicious for bilateral pneumonia. 2. Low lung volumes. 3. Chronic elevation of the left hemidiaphragm. 4. Dextroconvex thoracic scoliosis. Electronically Signed   By: Gaylyn Rong M.D.   On: 06/12/2020 16:04    EKG: Independently reviewed. Atrial fibrillation with RVR   Assessment/Plan:   Active Problems:   Pneumonia due to COVID-19 virus   COVID-19 pneumonia Acute hypoxemic respiratory failure Atrial fibrillation with rapid ventricular response Type II DM with severe hyperglycemia Elevated liver enzymes Elevated troponin-likely due to demand ischemia Elevated lactic acid-likely due to hypoxia Morbid obesity   PLAN  Admit to progressive cardiac unit. Place on droplet precautions. Treat with IV dexamethasone and IV remdesivir infusion. Continue 6 L/min oxygen via nasal cannula and taper off as able. Treat rapid A. fib with IV Cardizem infusion if heart rate remains elevated. Start Lantus and NovoLog as needed for hyperglycemia. Monitor inflammatory markers and liver enzymes.   Other information:   DVT prophylaxis: enoxaparin (LOVENOX) injection 40 mg Start: 06/12/20 2100 SCDs Start: 06/12/20 1805  Code Status: Full code. Family Communication: plan discussed with patient Disposition Plan: Possible discharge to home in 4 to5 days Consults called: None Admission status: Inpatient  The medical decision making on this patient was of high complexity  and the patient is at high risk for clinical deterioration, therefore this is a level 3 visit.     Time spent 60 minutes  Leveta Wahab [facility] Pager: Please check [email or web address]   How to contact the [facility] Attending or Consulting provider 7A - 7P or covering provider during after hours 7P -7A, for this patient?   1. Check the care team in [facility] and look for a) attending/consulting [facility] provider listed and b) the [facility] team listed 2. Log into [email or web address] and use [place]'s universal password to access. If you do not  have the password, please contact the hospital operator. 3. Locate the [facility] provider you are looking for under [facility] and page to a number that you can be directly reached. 4. If you still have difficulty reaching the provider, please page the [facility] (Director on Call) for the Hospitalists listed on amion for assistance.  06/12/2020, 6:21 PM

## 2020-06-12 NOTE — ED Notes (Signed)
Called lab for the rest of the labs to be drawn.

## 2020-06-12 NOTE — Consult Note (Signed)
Remdesivir - Pharmacy Brief Note   O:  ALT: no data available for this admission  CXR: "Patchy abnormal airspace opacities in the right mid lung and left lung base suspicious for bilateral pneumonia"  SpO2: 91% on 4L/min [place]   A/P:  Remdesivir 200 mg IVPB once followed by 100 mg IVPB daily x 4 days.   Per chart patient's daughter reports patient was diagnosed with COVID on 06/08/20.  Raiford Noble, PharmD Pharmacy Resident  06/12/2020 4:27 PM

## 2020-06-12 NOTE — ED Notes (Signed)
Report call report att Elon Jester hung up phone   Call back to [place], charge, took report, requests transport at [facility]

## 2020-06-12 NOTE — ED Notes (Signed)
Admitting MD and lab technician at bedside at this time.

## 2020-06-12 NOTE — ED Notes (Signed)
EDP made aware that patient HR back up to 160 after 2nd round of Diltiazem. Diltiazem drip ordered at this time.

## 2020-06-13 DIAGNOSIS — R739 Hyperglycemia, unspecified: Secondary | ICD-10-CM

## 2020-06-13 DIAGNOSIS — E872 Acidosis: Secondary | ICD-10-CM

## 2020-06-13 DIAGNOSIS — I4891 Unspecified atrial fibrillation: Secondary | ICD-10-CM | POA: Diagnosis present

## 2020-06-13 DIAGNOSIS — E66813 Obesity, class 3: Secondary | ICD-10-CM | POA: Diagnosis present

## 2020-06-13 DIAGNOSIS — J9601 Acute respiratory failure with hypoxia: Secondary | ICD-10-CM | POA: Diagnosis present

## 2020-06-13 DIAGNOSIS — R9431 Abnormal electrocardiogram [ECG] [EKG]: Secondary | ICD-10-CM

## 2020-06-13 DIAGNOSIS — R0902 Hypoxemia: Secondary | ICD-10-CM

## 2020-06-13 DIAGNOSIS — R6520 Severe sepsis without septic shock: Secondary | ICD-10-CM

## 2020-06-13 DIAGNOSIS — A419 Sepsis, unspecified organism: Secondary | ICD-10-CM

## 2020-06-13 LAB — CBC WITH DIFFERENTIAL/PLATELET
Abs Immature Granulocytes: 0.04 10*3/uL (ref 0.00–0.07)
Basophils Absolute: 0 10*3/uL (ref 0.0–0.1)
Basophils Relative: 0 %
Eosinophils Absolute: 0.1 10*3/uL (ref 0.0–0.5)
Eosinophils Relative: 2 %
HCT: 43.9 % (ref 36.0–46.0)
Hemoglobin: 14.9 g/dL (ref 12.0–15.0)
Immature Granulocytes: 1 %
Lymphocytes Relative: 33 %
Lymphs Abs: 1.5 10*3/uL (ref 0.7–4.0)
MCH: 27.6 pg (ref 26.0–34.0)
MCHC: 33.9 g/dL (ref 30.0–36.0)
MCV: 81.4 fL (ref 80.0–100.0)
Monocytes Absolute: 0.7 10*3/uL (ref 0.1–1.0)
Monocytes Relative: 15 %
Neutro Abs: 2.2 10*3/uL (ref 1.7–7.7)
Neutrophils Relative %: 49 %
Platelets: 182 10*3/uL (ref 150–400)
RBC: 5.39 MIL/uL — ABNORMAL HIGH (ref 3.87–5.11)
RDW: 14.2 % (ref 11.5–15.5)
Smear Review: NORMAL
WBC: 4.5 10*3/uL (ref 4.0–10.5)
nRBC: 0 % (ref 0.0–0.2)

## 2020-06-13 LAB — LIPID PANEL
Cholesterol: 311 mg/dL — ABNORMAL HIGH (ref 0–200)
HDL: 32 mg/dL — ABNORMAL LOW (ref >40)
LDL Cholesterol: 234 mg/dL — ABNORMAL HIGH (ref 0–99)
Total CHOL/HDL Ratio: 9.7 RATIO
Triglycerides: 225 mg/dL — ABNORMAL HIGH (ref <150)
VLDL: 45 mg/dL — ABNORMAL HIGH (ref 0–40)

## 2020-06-13 LAB — GLUCOSE, CAPILLARY
Glucose-Capillary: 252 mg/dL — ABNORMAL HIGH (ref 70–99)
Glucose-Capillary: 319 mg/dL — ABNORMAL HIGH (ref 70–99)
Glucose-Capillary: 364 mg/dL — ABNORMAL HIGH (ref 70–99)
Glucose-Capillary: 384 mg/dL — ABNORMAL HIGH (ref 70–99)
Glucose-Capillary: 422 mg/dL — ABNORMAL HIGH (ref 70–99)
Glucose-Capillary: 432 mg/dL — ABNORMAL HIGH (ref 70–99)

## 2020-06-13 LAB — C-REACTIVE PROTEIN
CRP: 9.7 mg/dL — ABNORMAL HIGH (ref <1.0)
CRP: 8 mg/dL — ABNORMAL HIGH (ref <1.0)

## 2020-06-13 LAB — PROCALCITONIN: Procalcitonin: 0.35 ng/mL

## 2020-06-13 LAB — HEMOGLOBIN A1C
Hgb A1c MFr Bld: 8.2 % — ABNORMAL HIGH (ref 4.8–5.6)
Hgb A1c MFr Bld: 8.2 % — ABNORMAL HIGH (ref 4.8–5.6)
Mean Plasma Glucose: 188.64 mg/dL
Mean Plasma Glucose: 188.64 mg/dL

## 2020-06-13 LAB — MAGNESIUM: Magnesium: 1.9 mg/dL (ref 1.7–2.4)

## 2020-06-13 LAB — COMPREHENSIVE METABOLIC PANEL
ALT: 95 U/L — ABNORMAL HIGH (ref 0–44)
AST: 61 U/L — ABNORMAL HIGH (ref 15–41)
Albumin: 2.9 g/dL — ABNORMAL LOW (ref 3.5–5.0)
Alkaline Phosphatase: 493 U/L — ABNORMAL HIGH (ref 38–126)
Anion gap: 13 (ref 5–15)
BUN: 28 mg/dL — ABNORMAL HIGH (ref 8–23)
CO2: 27 mmol/L (ref 22–32)
Calcium: 8.8 mg/dL — ABNORMAL LOW (ref 8.9–10.3)
Chloride: 97 mmol/L — ABNORMAL LOW (ref 98–111)
Creatinine, Ser: 0.99 mg/dL (ref 0.44–1.00)
GFR calc Af Amer: >60 mL/min (ref >60)
GFR calc non Af Amer: 60 mL/min — ABNORMAL LOW (ref >60)
Glucose, Bld: 396 mg/dL — ABNORMAL HIGH (ref 70–99)
Potassium: 3.8 mmol/L (ref 3.5–5.1)
Sodium: 137 mmol/L (ref 135–145)
Total Bilirubin: 0.7 mg/dL (ref 0.3–1.2)
Total Protein: 6.8 g/dL (ref 6.5–8.1)

## 2020-06-13 LAB — PHOSPHORUS: Phosphorus: 3.1 mg/dL (ref 2.5–4.6)

## 2020-06-13 LAB — FERRITIN: Ferritin: 308 ng/mL — ABNORMAL HIGH (ref 11–307)

## 2020-06-13 LAB — LACTIC ACID, PLASMA
Lactic Acid, Venous: 1.6 mmol/L (ref 0.5–1.9)
Lactic Acid, Venous: 1.4 mmol/L (ref 0.5–1.9)

## 2020-06-13 LAB — FIBRIN DERIVATIVES D-DIMER (ARMC ONLY): Fibrin derivatives D-dimer (ARMC): 476.88 ng/mL (FEU) (ref 0.00–499.00)

## 2020-06-13 MED ORDER — INSULIN ASPART 100 UNIT/ML ~~LOC~~ SOLN
0.0000 [IU] | SUBCUTANEOUS | Status: DC
  Administered 2020-06-13: 11 [IU] via SUBCUTANEOUS
  Administered 2020-06-13: 15 [IU] via SUBCUTANEOUS
  Administered 2020-06-13: 20 [IU] via SUBCUTANEOUS
  Administered 2020-06-14 (×5): 4 [IU] via SUBCUTANEOUS
  Administered 2020-06-15: 3 [IU] via SUBCUTANEOUS
  Administered 2020-06-15 (×2): 4 [IU] via SUBCUTANEOUS
  Administered 2020-06-15 – 2020-06-16 (×6): 3 [IU] via SUBCUTANEOUS
  Administered 2020-06-16: 4 [IU] via SUBCUTANEOUS
  Administered 2020-06-16: 3 [IU] via SUBCUTANEOUS
  Administered 2020-06-17 (×3): 7 [IU] via SUBCUTANEOUS
  Administered 2020-06-18: 4 [IU] via SUBCUTANEOUS
  Administered 2020-06-18 (×3): 3 [IU] via SUBCUTANEOUS
  Administered 2020-06-18: 4 [IU] via SUBCUTANEOUS
  Administered 2020-06-19: 3 [IU] via SUBCUTANEOUS
  Administered 2020-06-19: 4 [IU] via SUBCUTANEOUS
  Administered 2020-06-19 (×2): 3 [IU] via SUBCUTANEOUS
  Administered 2020-06-19: 4 [IU] via SUBCUTANEOUS
  Administered 2020-06-20: 3 [IU] via SUBCUTANEOUS
  Administered 2020-06-20: 7 [IU] via SUBCUTANEOUS
  Administered 2020-06-20: 11 [IU] via SUBCUTANEOUS
  Administered 2020-06-20 (×2): 4 [IU] via SUBCUTANEOUS
  Administered 2020-06-20: 7 [IU] via SUBCUTANEOUS
  Administered 2020-06-21: 11 [IU] via SUBCUTANEOUS
  Administered 2020-06-21: 7 [IU] via SUBCUTANEOUS
  Administered 2020-06-21 (×2): 11 [IU] via SUBCUTANEOUS
  Filled 2020-06-13 (×40): qty 1

## 2020-06-13 MED ORDER — INSULIN ASPART 100 UNIT/ML ~~LOC~~ SOLN
8.0000 [IU] | Freq: Three times a day (TID) | SUBCUTANEOUS | Status: DC
  Administered 2020-06-13 – 2020-06-18 (×12): 8 [IU] via SUBCUTANEOUS
  Filled 2020-06-13 (×12): qty 1

## 2020-06-13 MED ORDER — INSULIN GLARGINE 100 UNIT/ML ~~LOC~~ SOLN
20.0000 [IU] | Freq: Two times a day (BID) | SUBCUTANEOUS | Status: DC
  Administered 2020-06-13 – 2020-06-18 (×12): 20 [IU] via SUBCUTANEOUS
  Filled 2020-06-13 (×13): qty 0.2

## 2020-06-13 MED ORDER — ENOXAPARIN SODIUM 40 MG/0.4ML ~~LOC~~ SOLN
40.0000 mg | Freq: Two times a day (BID) | SUBCUTANEOUS | Status: DC
  Administered 2020-06-13 – 2020-06-14 (×2): 40 mg via SUBCUTANEOUS
  Filled 2020-06-13 (×2): qty 0.4

## 2020-06-13 MED ORDER — HYDROCOD POLST-CPM POLST ER 10-8 MG/5ML PO SUER
5.0000 mL | Freq: Two times a day (BID) | ORAL | Status: DC | PRN
  Administered 2020-06-14 – 2020-06-16 (×3): 5 mL via ORAL
  Filled 2020-06-13 (×4): qty 5

## 2020-06-13 MED ORDER — SODIUM CHLORIDE 0.9 % IV SOLN: INTRAVENOUS | Status: DC

## 2020-06-13 MED ORDER — MAGNESIUM SULFATE 2 GM/50ML IV SOLN
2.0000 g | Freq: Once | INTRAVENOUS | Status: AC
  Administered 2020-06-13: 2 g via INTRAVENOUS
  Filled 2020-06-13: qty 50

## 2020-06-13 MED ORDER — MELATONIN 5 MG PO TABS
2.5000 mg | ORAL_TABLET | Freq: Every day | ORAL | Status: DC
  Administered 2020-06-14 – 2020-06-26 (×13): 2.5 mg via ORAL
  Filled 2020-06-13 (×13): qty 1

## 2020-06-13 MED ORDER — ZINC SULFATE 220 (50 ZN) MG PO CAPS
220.0000 mg | ORAL_CAPSULE | Freq: Every day | ORAL | Status: DC
  Administered 2020-06-13 – 2020-06-27 (×12): 220 mg via ORAL
  Filled 2020-06-13 (×15): qty 1

## 2020-06-13 MED ORDER — POTASSIUM PHOSPHATES 15 MMOLE/5ML IV SOLN
40.0000 mmol | Freq: Once | INTRAVENOUS | Status: AC
  Administered 2020-06-13: 40 mmol via INTRAVENOUS
  Filled 2020-06-13: qty 13.33

## 2020-06-13 MED ORDER — ASCORBIC ACID 500 MG PO TABS
500.0000 mg | ORAL_TABLET | Freq: Every day | ORAL | Status: DC
  Administered 2020-06-13 – 2020-06-27 (×12): 500 mg via ORAL
  Filled 2020-06-13 (×14): qty 1

## 2020-06-13 MED ORDER — METHYLPREDNISOLONE SODIUM SUCC 125 MG IJ SOLR
60.0000 mg | Freq: Two times a day (BID) | INTRAMUSCULAR | Status: DC
  Administered 2020-06-13 – 2020-06-23 (×21): 60 mg via INTRAVENOUS
  Filled 2020-06-13 (×21): qty 2

## 2020-06-13 MED ORDER — IPRATROPIUM-ALBUTEROL 20-100 MCG/ACT IN AERS
1.0000 | INHALATION_SPRAY | Freq: Four times a day (QID) | RESPIRATORY_TRACT | Status: DC
  Administered 2020-06-13 – 2020-06-27 (×47): 1 via RESPIRATORY_TRACT
  Filled 2020-06-13: qty 4

## 2020-06-13 NOTE — Progress Notes (Signed)
Pt encouraged to prone, pt refused. Educated on benefits of prone position.. Will continue to encourage.

## 2020-06-13 NOTE — Progress Notes (Signed)
PHARMACIST - PHYSICIAN COMMUNICATION  CONCERNING:  Enoxaparin (Lovenox) for DVT Prophylaxis    RECOMMENDATION:    Filed Weights   06/12/20 1901 06/12/20 2139 06/13/20 0550  Weight: (!) 142 kg (313 lb 0.9 oz) 124.2 kg (273 lb 14.4 oz) 124.7 kg (275 lb)    Body mass index is 44.39 kg/m.  Estimated Creatinine Clearance: 76.5 mL/min (by C-G formula based on SCr of 0.99 mg/dL).   Based on [facility] policy patient is candidate for enoxaparin 40mg  every 12 hours due to BMI being >40.   DESCRIPTION: Pharmacy has adjusted enoxaparin dose per [facility] policy.  Patient is now receiving enoxaparin 40 mg every 12 hours    [facility], PharmD Clinical Pharmacist  06/13/2020 5:53 PM

## 2020-06-13 NOTE — Progress Notes (Signed)
PROGRESS NOTE    Chelsea Glass  HYW:[id number] DOB: March 16, 1956 DOA: 06/12/2020 PCP: Alfonse Flavors, MD     Brief Narrative:  64 y.o. BF PMHx HTN, DM type II morbid obesity,arthritis,  Brought to the hospital because of altered mental status. History was obtained from ED physician and chart review. Patient is unable to provide an adequate history. Reportedly, EMS was called because patient had been laying in bed for about 5 days without getting up. Apparently, she had been laying in her feces and urine. Per report, EMS had been called to the house about 4 days prior to admission but patient had refused transport to the hospital at that time.  ED Course:  The patient tested positive for Covid. She was in rapid A. fib so she was given bolus of IV Cardizem. She was also given 1 L of Ringer's lactate. She was started on IV dexamethasone and IV remdesivir.   Subjective: A/O x4, positive S OB, negative CP,, negative abdominal pain, negative nausea, negative vomiting, negative diarrhea.   Assessment & Plan: Covid vaccination; +1/2 vaccination   Active Problems:   Pneumonia due to COVID-19 virus   Acute respiratory failure with hypoxia (HCC)   Obesity, Class III, BMI 40-49.9 (morbid obesity) ([place])   Severe sepsis  -On admission patient met criteria for severe sepsis HR> 90,RR> 20, respiratory distress, lactic acid> 2 -Lactic acidosis has resolved post fluid resuscitation -Normal saline 55m/hr  Acute respiratory failure with hypoxia/Covid pneumonia -Solu-Medrol 60 mg BID -Remdesivir per pharmacy protocol -Vitamin C and zinc per Covid protocol -Incentive spirometry -Flutter valve  -Respimat QID  Diabetes type 2 uncontrolled with hyperglycemia -Lantus 20 units BID -NovoLog 8 units qac -Resistant SSI -Hemoglobin A1c pending  HLD -Lipid panel pending  Essential HTN -Coreg 12.5 mg BID -Cardizem drip  New onset A. fib -See HTN  Hypokalemia -Potassium goal>  4 -K-Phos IV 40 mmol  Hypomagnesmia -Magnesium goal> 2 -Magnesium IV 2 g  Hypophosphatemia -Phosphorus goal> 2.5 -See hypokalemia     DVT prophylaxis: Lovenox Code Status: Full Family Communication:  Status is: Inpatient    Dispo: The patient is from: Home              Anticipated d/c is to: Home              Anticipated d/c date is: 9/11              Patient currently unstable      Consultants:    Procedures/Significant Events:    I have personally reviewed and interpreted all radiology studies and my findings are as above.  VENTILATOR SETTINGS: Nasal cannula 9/6 Flow; 6 L/min SPO2; 95%   Cultures 9/6 acute hepatitis panel pending  Antimicrobials:    Devices    LINES / TUBES:      Continuous Infusions: . diltiazem (CARDIZEM) infusion 10 mg/hr (06/12/20 1940)  . remdesivir 100 mg in NS 100 mL       Objective: Vitals:   06/12/20 2218 06/13/20 0550 06/13/20 0550 06/13/20 0726  BP: (!) 147/96  (!) 146/99 135/78  Pulse: (!) 106  98 99  Resp: 19   20  Temp: 97.9 F (36.6 C)  98.4 F (36.9 C) 98.2 F (36.8 C)  TempSrc: Oral  Oral Oral  SpO2: 92%  94% 95%  Weight:  124.7 kg    Height:        Intake/Output Summary (Last 24 hours) at 06/13/2020 0908 Last data filed at 06/13/2020 [id number]  per 24 hour  Intake 1011.88 ml  Output 250 ml  Net 761.88 ml   Filed Weights   06/12/20 1901 06/12/20 2139 06/13/20 0550  Weight: (!) 142 kg 124.2 kg 124.7 kg    Examination:  General: A/O x4, positive acute respiratory distress Eyes: negative scleral hemorrhage, negative anisocoria, negative icterus ENT: Negative Runny nose, negative gingival bleeding, Neck:  Negative scars, masses, torticollis, lymphadenopathy, JVD Lungs: extremely poor air movement bilaterally without wheezes or crackles Cardiovascular: Regular rate and rhythm without murmur gallop or rub normal S1 and S2 Abdomen: Morbidly obese, negative abdominal pain, nondistended,  positive soft, bowel sounds, no rebound, no ascites, no appreciable mass Extremities: No significant cyanosis, clubbing, or edema bilateral lower extremities Skin: Negative rashes, lesions, ulcers Psychiatric:  Negative depression, negative anxiety, negative fatigue, negative mania  Central nervous system:  Cranial nerves II through XII intact, tongue/uvula midline, all extremities muscle strength 5/5, sensation intact throughout,negative dysarthria, negative expressive aphasia, negative receptive aphasia.  .     Data Reviewed: Care during the described time interval was provided by me .  I have reviewed this patient's available data, including medical history, events of note, physical examination, and all test results as part of my evaluation.  CBC: Recent Labs  Lab 06/12/20 1551  WBC 5.3  NEUTROABS 2.7  HGB 15.4*  HCT 47.5*  MCV 84.2  PLT 578   Basic Metabolic Panel: Recent Labs  Lab 06/12/20 1551  NA 136  K 3.7  CL 98  CO2 21*  GLUCOSE 424*  BUN 25*  CREATININE 1.10*  CALCIUM 9.0  MG 1.9   GFR: Estimated Creatinine Clearance: 68.8 mL/min (A) (by C-G formula based on SCr of 1.1 mg/dL (H)). Liver Function Tests: Recent Labs  Lab 06/12/20 1551  AST 79*  ALT 108*  ALKPHOS 558*  BILITOT 0.8  PROT 7.0  ALBUMIN 3.0*   No results for input(s): LIPASE, AMYLASE in the last 168 hours. No results for input(s): AMMONIA in the last 168 hours. Coagulation Profile: No results for input(s): INR, PROTIME in the last 168 hours. Cardiac Enzymes: No results for input(s): CKTOTAL, CKMB, CKMBINDEX, TROPONINI in the last 168 hours. BNP (last 3 results) No results for input(s): PROBNP in the last 8760 hours. HbA1C: No results for input(s): HGBA1C in the last 72 hours. CBG: Recent Labs  Lab 06/12/20 2151 06/13/20 0037 06/13/20 0728  GLUCAP 419* 422* 432*   Lipid Profile: No results for input(s): CHOL, HDL, LDLCALC, TRIG, CHOLHDL, LDLDIRECT in the last 72 hours. Thyroid  Function Tests: No results for input(s): TSH, T4TOTAL, FREET4, T3FREE, THYROIDAB in the last 72 hours. Anemia Panel: No results for input(s): VITAMINB12, FOLATE, FERRITIN, TIBC, IRON, RETICCTPCT in the last 72 hours. Sepsis Labs: Recent Labs  Lab 06/12/20 1552  LATICACIDVEN 2.8*    Recent Results (from the past 240 hour(s))  SARS Coronavirus 2 by RT PCR (hospital order, performed in [facility] hospital lab) Nasopharyngeal Nasopharyngeal Swab     Status: Abnormal   Collection Time: 06/12/20  3:21 PM   Specimen: Nasopharyngeal Swab  Result Value Ref Range Status   SARS Coronavirus 2 POSITIVE (A) NEGATIVE Final    Comment: RESULT CALLED TO, READ BACK BY AND VERIFIED WITH: MEGAN JONES AT 1634 06/12/20.PMF (NOTE) SARS-CoV-2 target nucleic acids are DETECTED  SARS-CoV-2 RNA is generally detectable in upper respiratory specimens  during the acute phase of infection.  Positive results are indicative  of the presence of the identified virus, but do not rule out  bacterial infection or co-infection with other pathogens not detected by the test.  Clinical correlation with patient history and  other diagnostic information is necessary to determine patient infection status.  The expected result is negative.  Fact Sheet for Patients:   StrictlyIdeas.no   Fact Sheet for Healthcare Providers:   BankingDealers.co.za    This test is not yet approved or cleared by the [place] FDA and  has been authorized for detection and/or diagnosis of SARS-CoV-2 by FDA under an Emergency Use Authorization (EUA).  This EUA will remain in effect (meaning this test c an be used) for the duration of  the COVID-19 declaration under Section 564(b)(1) of the Act, 21 U.S.C. section 360-bbb-3(b)(1), unless the authorization is terminated or revoked sooner.  Performed at [facility], [address] [place], [place] [address]   Blood culture (routine x  2)     Status: None (Preliminary result)   Collection Time: 06/12/20  3:21 PM   Specimen: BLOOD  Result Value Ref Range Status   Specimen Description BLOOD RIGHT FOREARM  Final   Special Requests   Final    BOTTLES DRAWN AEROBIC AND ANAEROBIC Blood Culture adequate volume   Culture   Final    NO GROWTH < 24 HOURS Performed at [facility], [address] [place],  [address]    Report Status PENDING  Incomplete  Blood culture (routine x 2)     Status: None (Preliminary result)   Collection Time: 06/12/20  3:21 PM   Specimen: BLOOD  Result Value Ref Range Status   Specimen Description BLOOD LEFT HAND  Final   Special Requests   Final    BOTTLES DRAWN AEROBIC AND ANAEROBIC Blood Culture adequate volume   Culture   Final    NO GROWTH < 24 HOURS Performed at [facility], [address], [place],  [address]    Report Status PENDING  Incomplete         Radiology Studies: CT Head Wo Contrast  Result Date: 06/12/2020 CLINICAL DATA:  Mental status change. EXAM: CT HEAD WITHOUT CONTRAST TECHNIQUE: Contiguous axial images were obtained from the base of the skull through the vertex without intravenous contrast. COMPARISON:  None. FINDINGS: Brain: No evidence of acute infarction, hemorrhage, hydrocephalus, extra-axial collection or mass lesion/mass effect. Vascular: Atherosclerotic calcifications of the intra cavernous carotid arteries. Skull: Normal. Negative for fracture or focal lesion. Sinuses/Orbits: No acute finding. Other: None. IMPRESSION: No acute intracranial abnormality. Electronically Signed   By: Fidela Salisbury M.D.   On: 06/12/2020 17:55   DG Chest Portable 1 View  Result Date: 06/12/2020 CLINICAL DATA:  Tachypnea and hypoxia.  Hyperglycemia. EXAM: PORTABLE CHEST 1 VIEW COMPARISON:  10/29/2018 FINDINGS: Low lung volumes are present, causing crowding of the pulmonary vasculature. Chronic elevation of left hemidiaphragm. Patchy abnormal  airspace opacities particularly in the right mid lung and left lung base suspicious for bilateral pneumonia. Dextroconvex thoracic scoliosis. Heart size within normal limits given the projection. IMPRESSION: 1. Patchy abnormal airspace opacities in the right mid lung and left lung base suspicious for bilateral pneumonia. 2. Low lung volumes. 3. Chronic elevation of the left hemidiaphragm. 4. Dextroconvex thoracic scoliosis. Electronically Signed   By: Van Clines M.D.   On: 06/12/2020 16:04        Scheduled Meds: . carvedilol  12.5 mg Oral BID WC  . insulin aspart  0-15 Units Subcutaneous TID WC  . insulin aspart  0-5 Units Subcutaneous QHS  . insulin  glargine  20 Units Subcutaneous BID  . methylPREDNISolone (SOLU-MEDROL) injection  60 mg Intravenous Q12H   Continuous Infusions: . diltiazem (CARDIZEM) infusion 10 mg/hr (06/12/20 1940)  . remdesivir 100 mg in NS 100 mL       LOS: 1 day    Time spent:40 min   Bernice Mcauliffe, Geraldo Docker, MD [facility] Pager [phone]  If 7PM-7AM, please contact night-coverage [email or web address] Password [email or web address] 06/13/2020, 9:08 AM

## 2020-06-13 NOTE — Progress Notes (Signed)
Pt's CBG is 432, MD made aware.

## 2020-06-13 NOTE — Progress Notes (Signed)
Had order in for ACHS blood sugars and Q4hr finger sticks. Notified NP. Order for the Qhr fingers sticks only.

## 2020-06-14 DIAGNOSIS — R9431 Abnormal electrocardiogram [ECG] [EKG]: Secondary | ICD-10-CM

## 2020-06-14 LAB — HEPATITIS PANEL, ACUTE
HCV Ab: NONREACTIVE
Hep A IgM: NONREACTIVE
Hep B C IgM: NONREACTIVE
Hepatitis B Surface Ag: NONREACTIVE

## 2020-06-14 LAB — CBC WITH DIFFERENTIAL/PLATELET
Abs Immature Granulocytes: 0.05 10*3/uL (ref 0.00–0.07)
Basophils Absolute: 0 10*3/uL (ref 0.0–0.1)
Basophils Relative: 0 %
Eosinophils Absolute: 0 10*3/uL (ref 0.0–0.5)
Eosinophils Relative: 0 %
HCT: 42.3 % (ref 36.0–46.0)
Hemoglobin: 14.4 g/dL (ref 12.0–15.0)
Immature Granulocytes: 1 %
Lymphocytes Relative: 20 %
Lymphs Abs: 1.6 10*3/uL (ref 0.7–4.0)
MCH: 28 pg (ref 26.0–34.0)
MCHC: 34 g/dL (ref 30.0–36.0)
MCV: 82.3 fL (ref 80.0–100.0)
Monocytes Absolute: 0.6 10*3/uL (ref 0.1–1.0)
Monocytes Relative: 8 %
Neutro Abs: 5.4 10*3/uL (ref 1.7–7.7)
Neutrophils Relative %: 71 %
Platelets: 187 10*3/uL (ref 150–400)
RBC: 5.14 MIL/uL — ABNORMAL HIGH (ref 3.87–5.11)
RDW: 14.2 % (ref 11.5–15.5)
Smear Review: NORMAL
WBC: 7.6 10*3/uL (ref 4.0–10.5)
nRBC: 0 % (ref 0.0–0.2)

## 2020-06-14 LAB — COMPREHENSIVE METABOLIC PANEL
ALT: 84 U/L — ABNORMAL HIGH (ref 0–44)
AST: 59 U/L — ABNORMAL HIGH (ref 15–41)
Albumin: 2.8 g/dL — ABNORMAL LOW (ref 3.5–5.0)
Alkaline Phosphatase: 462 U/L — ABNORMAL HIGH (ref 38–126)
Anion gap: 10 (ref 5–15)
BUN: 35 mg/dL — ABNORMAL HIGH (ref 8–23)
CO2: 25 mmol/L (ref 22–32)
Calcium: 9.3 mg/dL (ref 8.9–10.3)
Chloride: 99 mmol/L (ref 98–111)
Creatinine, Ser: 1.31 mg/dL — ABNORMAL HIGH (ref 0.44–1.00)
GFR calc Af Amer: 49 mL/min — ABNORMAL LOW (ref >60)
GFR calc non Af Amer: 43 mL/min — ABNORMAL LOW (ref >60)
Glucose, Bld: 194 mg/dL — ABNORMAL HIGH (ref 70–99)
Potassium: 4 mmol/L (ref 3.5–5.1)
Sodium: 134 mmol/L — ABNORMAL LOW (ref 135–145)
Total Bilirubin: 0.6 mg/dL (ref 0.3–1.2)
Total Protein: 6.8 g/dL (ref 6.5–8.1)

## 2020-06-14 LAB — FIBRIN DERIVATIVES D-DIMER (ARMC ONLY): Fibrin derivatives D-dimer (ARMC): 463.84 ng/mL (FEU) (ref 0.00–499.00)

## 2020-06-14 LAB — PHOSPHORUS: Phosphorus: 4.1 mg/dL (ref 2.5–4.6)

## 2020-06-14 LAB — C-REACTIVE PROTEIN: CRP: 4.4 mg/dL — ABNORMAL HIGH (ref <1.0)

## 2020-06-14 LAB — GLUCOSE, CAPILLARY
Glucose-Capillary: 115 mg/dL — ABNORMAL HIGH (ref 70–99)
Glucose-Capillary: 121 mg/dL — ABNORMAL HIGH (ref 70–99)
Glucose-Capillary: 151 mg/dL — ABNORMAL HIGH (ref 70–99)
Glucose-Capillary: 171 mg/dL — ABNORMAL HIGH (ref 70–99)
Glucose-Capillary: 174 mg/dL — ABNORMAL HIGH (ref 70–99)
Glucose-Capillary: 182 mg/dL — ABNORMAL HIGH (ref 70–99)
Glucose-Capillary: 184 mg/dL — ABNORMAL HIGH (ref 70–99)
Glucose-Capillary: 200 mg/dL — ABNORMAL HIGH (ref 70–99)

## 2020-06-14 LAB — MAGNESIUM: Magnesium: 2.3 mg/dL (ref 1.7–2.4)

## 2020-06-14 LAB — FERRITIN: Ferritin: 296 ng/mL (ref 11–307)

## 2020-06-14 LAB — PROCALCITONIN: Procalcitonin: 0.28 ng/mL

## 2020-06-14 MED ORDER — ENOXAPARIN SODIUM 80 MG/0.8ML ~~LOC~~ SOLN
0.5000 mg/kg | SUBCUTANEOUS | Status: DC
  Administered 2020-06-15 – 2020-06-26 (×12): 62.5 mg via SUBCUTANEOUS
  Filled 2020-06-14 (×11): qty 0.8

## 2020-06-14 NOTE — Progress Notes (Signed)
PROGRESS NOTE    Chelsea Glass  TIW:[id number] DOB: 1955/10/09 DOA: 06/12/2020 PCP: [facility]     Brief Narrative:  64 y.o. BF PMHx HTN, DM type II morbid obesity,arthritis,  Brought to the hospital because of altered mental status. History was obtained from ED physician and chart review. Patient is unable to provide an adequate history. Reportedly, EMS was called because patient had been laying in bed for about 5 days without getting up. Apparently, she had been laying in her feces and urine. Per report, EMS had been called to the house about 4 days prior to admission but patient had refused transport to the hospital at that time.  ED Course:  The patient tested positive for Covid. She was in rapid A. fib so she was given bolus of IV Cardizem. She was also given 1 L of Ringer's lactate. She was started on IV dexamethasone and IV remdesivir.   Subjective: 9/7 afebrile overnight A/O x4, positive S OB, negative CP, negative abdominal pain, negative nausea, negative vomiting, negative diarrhea,   Assessment & Plan: Covid vaccination; +1/2 vaccination   Active Problems:   Pneumonia due to COVID-19 virus   Acute respiratory failure with hypoxia ([place])   Obesity, Class III, BMI 40-49.9 (morbid obesity) ([place])   Severe sepsis ([place])   New onset atrial fibrillation ([facility])   Severe sepsis  -On admission patient met criteria for severe sepsis HR> 90,RR> 20, respiratory distress, lactic acid> 2 -Lactic acidosis has resolved post fluid resuscitation -Normal saline 9m/hr -Lactic acidosis resolved  Acute respiratory failure with hypoxia/Covid pneumonia  COVID-19 Labs  Recent Labs    06/12/20 1805 06/13/20 1044 06/14/20 0510  FERRITIN  --  308* 296  CRP 9.7* 8.0* 4.4*    Lab Results  Component Value Date   SARSCOV2NAA POSITIVE (A) 06/12/2020   SARSCOV2NAA NEGATIVE 07/31/2019    -Solu-Medrol 60 mg BID -Remdesivir per pharmacy protocol -Vitamin C and  zinc per Covid protocol -Incentive spirometry -Flutter valve  -Respimat QID  Diabetes type 2 uncontrolled with hyperglycemia -Lantus 20 units BID -NovoLog 8 units qac -Resistant SSI -9/6 Hemoglobin A1c= 8.2 -Consult diabetic coordinator; obese uncontrolled diabetic requires teaching -Consult diabetic nutritionist obese uncontrolled diabetic requires teaching  HLD -9/6 LDL= 234 -As soon as patient completes treatment for Covid would start patient on Lipitor 40 mg daily  Essential HTN -Coreg 12.5 mg BID -Cardizem drip (discontinued), patient now in NSR  New onset A. fib -See HTN  Hypokalemia -Potassium goal> 4  Hypomagnesmia -Magnesium goal> 2  Hypophosphatemia -Phosphorus goal> 2.5   Elevated liver enzymes -We will need to monitor closely but appear to be only slightly elevated and stable   DVT prophylaxis: Lovenox Code Status: Full Family Communication:  Status is: Inpatient    Dispo: The patient is from: Home              Anticipated d/c is to: Home              Anticipated d/c date is: 9/11              Patient currently unstable      Consultants:    Procedures/Significant Events:    I have personally reviewed and interpreted all radiology studies and my findings are as above.  VENTILATOR SETTINGS: Nasal cannula 9/7 Flow; 5 L/min SPO2; 92%   Cultures 9/6 acute hepatitis panel pending  Antimicrobials:    Devices    LINES / TUBES:      Continuous Infusions: .  sodium chloride 40 mL/hr at 06/14/20 0329  . diltiazem (CARDIZEM) infusion Stopped (06/14/20 0255)  . remdesivir 100 mg in NS 100 mL 100 mg (06/14/20 0925)     Objective: Vitals:   06/13/20 2055 06/14/20 0511 06/14/20 0725 06/14/20 1136  BP:  136/83 122/73 129/79  Pulse:  78 76 80  Resp:   17 20  Temp:  (!) 97.5 F (36.4 C) (!) 97.4 F (36.3 C) 97.7 F (36.5 C)  TempSrc:  Oral Oral Oral  SpO2: 92% 90% (!) 88% (!) 83%  Weight:  125.8 kg    Height:         Intake/Output Summary (Last 24 hours) at 06/14/2020 1313 Last data filed at 06/14/2020 1139 Gross per 24 hour  Intake --  Output 950 ml  Net -950 ml   Filed Weights   06/12/20 2139 06/13/20 0550 06/14/20 0511  Weight: 124.2 kg 124.7 kg 125.8 kg    Examination:  General: A/O x4, positive acute respiratory distress Eyes: negative scleral hemorrhage, negative anisocoria, negative icterus ENT: Negative Runny nose, negative gingival bleeding, Neck:  Negative scars, masses, torticollis, lymphadenopathy, JVD Lungs: extremely poor air movement bilaterally without wheezes or crackles Cardiovascular: Regular rate and rhythm without murmur gallop or rub normal S1 and S2 Abdomen: Morbidly obese, negative abdominal pain, nondistended, positive soft, bowel sounds, no rebound, no ascites, no appreciable mass Extremities: No significant cyanosis, clubbing, or edema bilateral lower extremities Skin: Negative rashes, lesions, ulcers Psychiatric:  Negative depression, negative anxiety, negative fatigue, negative mania  Central nervous system:  Cranial nerves II through XII intact, tongue/uvula midline, all extremities muscle strength 5/5, sensation intact throughout,negative dysarthria, negative expressive aphasia, negative receptive aphasia.  .     Data Reviewed: Care during the described time interval was provided by me .  I have reviewed this patient's available data, including medical history, events of note, physical examination, and all test results as part of my evaluation.  CBC: Recent Labs  Lab 06/12/20 1551 06/13/20 1044 06/14/20 0510  WBC 5.3 4.5 7.6  NEUTROABS 2.7 2.2 5.4  HGB 15.4* 14.9 14.4  HCT 47.5* 43.9 42.3  MCV 84.2 81.4 82.3  PLT 191 182 465   Basic Metabolic Panel: Recent Labs  Lab 06/12/20 1551 06/13/20 1044 06/14/20 0510  NA 136 137 134*  K 3.7 3.8 4.0  CL 98 97* 99  CO2 21* 27 25  GLUCOSE 424* 396* 194*  BUN 25* 28* 35*  CREATININE 1.10* 0.99 1.31*   CALCIUM 9.0 8.8* 9.3  MG 1.9 1.9 2.3  PHOS  --  3.1 4.1   GFR: Estimated Creatinine Clearance: 58.1 mL/min (A) (by C-G formula based on SCr of 1.31 mg/dL (H)). Liver Function Tests: Recent Labs  Lab 06/12/20 1551 06/13/20 1044 06/14/20 0510  AST 79* 61* 59*  ALT 108* 95* 84*  ALKPHOS 558* 493* 462*  BILITOT 0.8 0.7 0.6  PROT 7.0 6.8 6.8  ALBUMIN 3.0* 2.9* 2.8*   No results for input(s): LIPASE, AMYLASE in the last 168 hours. No results for input(s): AMMONIA in the last 168 hours. Coagulation Profile: No results for input(s): INR, PROTIME in the last 168 hours. Cardiac Enzymes: No results for input(s): CKTOTAL, CKMB, CKMBINDEX, TROPONINI in the last 168 hours. BNP (last 3 results) No results for input(s): PROBNP in the last 8760 hours. HbA1C: Recent Labs    06/13/20 1044 06/13/20 1321  HGBA1C 8.2* 8.2*   CBG: Recent Labs  Lab 06/14/20 0020 06/14/20 0220 06/14/20 0508 06/14/20 0726 06/14/20  [place]   Lipid Profile: Recent Labs    06/13/20 1044  CHOL 311*  HDL 32*  LDLCALC 234*  TRIG 225*  CHOLHDL 9.7   Thyroid Function Tests: No results for input(s): TSH, T4TOTAL, FREET4, T3FREE, THYROIDAB in the last 72 hours. Anemia Panel: Recent Labs    06/13/20 1044 06/14/20 0510  FERRITIN 308* 296   Sepsis Labs: Recent Labs  Lab 06/12/20 1552 06/13/20 1044 06/13/20 1321 06/14/20 0510  PROCALCITON  --  0.35  --  0.28  LATICACIDVEN 2.8* 1.6 1.4  --     Recent Results (from the past 240 hour(s))  SARS Coronavirus 2 by RT PCR (hospital order, performed in [facility] hospital lab) Nasopharyngeal Nasopharyngeal Swab     Status: Abnormal   Collection Time: 06/12/20  3:21 PM   Specimen: Nasopharyngeal Swab  Result Value Ref Range Status   SARS Coronavirus 2 POSITIVE (A) NEGATIVE Final    Comment: RESULT CALLED TO, READ BACK BY AND VERIFIED WITH: MEGAN JONES AT 1634 06/12/20.PMF (NOTE) SARS-CoV-2 target nucleic acids are  DETECTED  SARS-CoV-2 RNA is generally detectable in upper respiratory specimens  during the acute phase of infection.  Positive results are indicative  of the presence of the identified virus, but do not rule out bacterial infection or co-infection with other pathogens not detected by the test.  Clinical correlation with patient history and  other diagnostic information is necessary to determine patient infection status.  The expected result is negative.  Fact Sheet for Patients:   StrictlyIdeas.no   Fact Sheet for Healthcare Providers:   BankingDealers.co.za    This test is not yet approved or cleared by the [place] FDA and  has been authorized for detection and/or diagnosis of SARS-CoV-2 by FDA under an Emergency Use Authorization (EUA).  This EUA will remain in effect (meaning this test c an be used) for the duration of  the COVID-19 declaration under Section 564(b)(1) of the Act, 21 U.S.C. section 360-bbb-3(b)(1), unless the authorization is terminated or revoked sooner.  Performed at [facility], [address] [place], [place] [address]   Blood culture (routine x 2)     Status: None (Preliminary result)   Collection Time: 06/12/20  3:21 PM   Specimen: BLOOD  Result Value Ref Range Status   Specimen Description BLOOD RIGHT FOREARM  Final   Special Requests   Final    BOTTLES DRAWN AEROBIC AND ANAEROBIC Blood Culture adequate volume   Culture   Final    NO GROWTH 2 DAYS Performed at [facility], [address], [place], [place] [address]    Report Status PENDING  Incomplete  Blood culture (routine x 2)     Status: None (Preliminary result)   Collection Time: 06/12/20  3:21 PM   Specimen: BLOOD  Result Value Ref Range Status   Specimen Description BLOOD LEFT HAND  Final   Special Requests   Final    BOTTLES DRAWN AEROBIC AND ANAEROBIC Blood Culture adequate volume   Culture   Final    NO  GROWTH 2 DAYS Performed at [facility], [address] [place], [place] [address]    Report Status PENDING  Incomplete         Radiology Studies: CT Head Wo Contrast  Result Date: 06/12/2020 CLINICAL DATA:  Mental status change. EXAM: CT HEAD WITHOUT CONTRAST TECHNIQUE: Contiguous axial images were obtained from the base of the skull through the vertex without  intravenous contrast. COMPARISON:  None. FINDINGS: Brain: No evidence of acute infarction, hemorrhage, hydrocephalus, extra-axial collection or mass lesion/mass effect. Vascular: Atherosclerotic calcifications of the intra cavernous carotid arteries. Skull: Normal. Negative for fracture or focal lesion. Sinuses/Orbits: No acute finding. Other: None. IMPRESSION: No acute intracranial abnormality. Electronically Signed   By: Fidela Salisbury M.D.   On: 06/12/2020 17:55   DG Chest Portable 1 View  Result Date: 06/12/2020 CLINICAL DATA:  Tachypnea and hypoxia.  Hyperglycemia. EXAM: PORTABLE CHEST 1 VIEW COMPARISON:  10/29/2018 FINDINGS: Low lung volumes are present, causing crowding of the pulmonary vasculature. Chronic elevation of left hemidiaphragm. Patchy abnormal airspace opacities particularly in the right mid lung and left lung base suspicious for bilateral pneumonia. Dextroconvex thoracic scoliosis. Heart size within normal limits given the projection. IMPRESSION: 1. Patchy abnormal airspace opacities in the right mid lung and left lung base suspicious for bilateral pneumonia. 2. Low lung volumes. 3. Chronic elevation of the left hemidiaphragm. 4. Dextroconvex thoracic scoliosis. Electronically Signed   By: Van Clines M.D.   On: 06/12/2020 16:04        Scheduled Meds: . vitamin C  500 mg Oral Daily  . carvedilol  12.5 mg Oral BID WC  . enoxaparin (LOVENOX) injection  40 mg Subcutaneous Q12H  . insulin aspart  0-20 Units Subcutaneous Q4H  . insulin aspart  8 Units Subcutaneous TID WC  . insulin glargine  20  Units Subcutaneous BID  . Ipratropium-Albuterol  1 puff Inhalation Q6H  . melatonin  2.5 mg Oral QHS  . methylPREDNISolone (SOLU-MEDROL) injection  60 mg Intravenous Q12H  . zinc sulfate  220 mg Oral Daily   Continuous Infusions: . sodium chloride 40 mL/hr at 06/14/20 0329  . diltiazem (CARDIZEM) infusion Stopped (06/14/20 0255)  . remdesivir 100 mg in NS 100 mL 100 mg (06/14/20 0925)     LOS: 2 days    Time spent:40 min   Florene Brill, Geraldo Docker, MD [facility] Pager [phone]  If 7PM-7AM, please contact night-coverage [email or web address] Password [email or web address] 06/14/2020, 1:13 PM

## 2020-06-14 NOTE — Progress Notes (Signed)
Pt on Cardizem drip. Pt converted to sinus rhythm. Now sinus rhythm with first degree heart block. Ekg performed. Notified NP of changes. Cardizem drip stopped. Will continue to monitor.

## 2020-06-15 LAB — COMPREHENSIVE METABOLIC PANEL
ALT: 86 U/L — ABNORMAL HIGH (ref 0–44)
AST: 61 U/L — ABNORMAL HIGH (ref 15–41)
Albumin: 2.8 g/dL — ABNORMAL LOW (ref 3.5–5.0)
Alkaline Phosphatase: 462 U/L — ABNORMAL HIGH (ref 38–126)
Anion gap: 11 (ref 5–15)
BUN: 41 mg/dL — ABNORMAL HIGH (ref 8–23)
CO2: 23 mmol/L (ref 22–32)
Calcium: 9.4 mg/dL (ref 8.9–10.3)
Chloride: 104 mmol/L (ref 98–111)
Creatinine, Ser: 1.14 mg/dL — ABNORMAL HIGH (ref 0.44–1.00)
GFR calc Af Amer: 58 mL/min — ABNORMAL LOW (ref >60)
GFR calc non Af Amer: 50 mL/min — ABNORMAL LOW (ref >60)
Glucose, Bld: 122 mg/dL — ABNORMAL HIGH (ref 70–99)
Potassium: 3.7 mmol/L (ref 3.5–5.1)
Sodium: 138 mmol/L (ref 135–145)
Total Bilirubin: 0.8 mg/dL (ref 0.3–1.2)
Total Protein: 6.6 g/dL (ref 6.5–8.1)

## 2020-06-15 LAB — MAGNESIUM: Magnesium: 2.5 mg/dL — ABNORMAL HIGH (ref 1.7–2.4)

## 2020-06-15 LAB — C-REACTIVE PROTEIN: CRP: 2.2 mg/dL — ABNORMAL HIGH (ref <1.0)

## 2020-06-15 LAB — CBC WITH DIFFERENTIAL/PLATELET
Abs Immature Granulocytes: 0.07 10*3/uL (ref 0.00–0.07)
Basophils Absolute: 0 10*3/uL (ref 0.0–0.1)
Basophils Relative: 0 %
Eosinophils Absolute: 0 10*3/uL (ref 0.0–0.5)
Eosinophils Relative: 0 %
HCT: 43.6 % (ref 36.0–46.0)
Hemoglobin: 14.4 g/dL (ref 12.0–15.0)
Immature Granulocytes: 1 %
Lymphocytes Relative: 13 %
Lymphs Abs: 1.4 10*3/uL (ref 0.7–4.0)
MCH: 27.1 pg (ref 26.0–34.0)
MCHC: 33 g/dL (ref 30.0–36.0)
MCV: 82 fL (ref 80.0–100.0)
Monocytes Absolute: 0.8 10*3/uL (ref 0.1–1.0)
Monocytes Relative: 7 %
Neutro Abs: 8.5 10*3/uL — ABNORMAL HIGH (ref 1.7–7.7)
Neutrophils Relative %: 79 %
Platelets: 212 10*3/uL (ref 150–400)
RBC: 5.32 MIL/uL — ABNORMAL HIGH (ref 3.87–5.11)
RDW: 14 % (ref 11.5–15.5)
WBC: 10.8 10*3/uL — ABNORMAL HIGH (ref 4.0–10.5)
nRBC: 0 % (ref 0.0–0.2)

## 2020-06-15 LAB — GLUCOSE, CAPILLARY
Glucose-Capillary: 124 mg/dL — ABNORMAL HIGH (ref 70–99)
Glucose-Capillary: 134 mg/dL — ABNORMAL HIGH (ref 70–99)
Glucose-Capillary: 161 mg/dL — ABNORMAL HIGH (ref 70–99)
Glucose-Capillary: 193 mg/dL — ABNORMAL HIGH (ref 70–99)
Glucose-Capillary: 136 mg/dL — ABNORMAL HIGH (ref 70–99)

## 2020-06-15 LAB — HIV ANTIBODY (ROUTINE TESTING W REFLEX): HIV Screen 4th Generation wRfx: REACTIVE — AB

## 2020-06-15 LAB — PHOSPHORUS: Phosphorus: 4.2 mg/dL (ref 2.5–4.6)

## 2020-06-15 LAB — FIBRIN DERIVATIVES D-DIMER (ARMC ONLY): Fibrin derivatives D-dimer (ARMC): 412.73 ng/mL (FEU) (ref 0.00–499.00)

## 2020-06-15 LAB — PROCALCITONIN: Procalcitonin: 0.12 ng/mL

## 2020-06-15 LAB — FERRITIN: Ferritin: 269 ng/mL (ref 11–307)

## 2020-06-15 MED ORDER — NEPRO/CARBSTEADY PO LIQD
237.0000 mL | Freq: Three times a day (TID) | ORAL | Status: DC
  Administered 2020-06-15 – 2020-06-26 (×23): 237 mL via ORAL

## 2020-06-15 MED ORDER — ADULT MULTIVITAMIN W/MINERALS CH
1.0000 | ORAL_TABLET | Freq: Every day | ORAL | Status: DC
  Administered 2020-06-16 – 2020-06-27 (×9): 1 via ORAL
  Filled 2020-06-15 (×12): qty 1

## 2020-06-15 NOTE — Progress Notes (Signed)
Inpatient Diabetes Program Recommendations  AACE/ADA: New Consensus Statement on Inpatient Glycemic Control   Target Ranges:  Prepandial:   less than 140 mg/dL      Peak postprandial:   less than 180 mg/dL (1-2 hours)      Critically ill patients:  140 - 180 mg/dL  Results for BARBAR, BREDE (MRN [id number]) as of 06/15/2020 09:31  Ref. Range 06/14/2020 07:26 06/14/2020 11:36 06/14/2020 16:54 06/14/2020 20:42 06/14/2020 23:44 06/15/2020 04:16 06/15/2020 07:44  Glucose-Capillary Latest Ref Range: 70 - 99 mg/dL 106 (H) 269 (H) 485 (H) 115 (H) 121 (H) 124 (H) 134 (H)   Results for LABRITTANY, WECHTER (MRN [id number]) as of 06/15/2020 09:31  Ref. Range 06/13/2020 10:44 06/13/2020 13:21  Hemoglobin A1C Latest Ref Range: 4.8 - 5.6 % 8.2 (H) 8.2 (H)   Review of Glycemic Control  Diabetes history: DM2 Outpatient Diabetes medications: Metformin XR 1000 mg QAM Current orders for Inpatient glycemic control: Lantus 20 units BID, Novolog 8 units TID with meals, Novolog 0-20 units Q4H; Solumedrol 60 mg Q12H  Inpatient Diabetes Program Recommendations:    HbgA1C: A1C 8.2% on 06/13/20 indicating an average glucose of 189 mg/dl over the past 2-3 months.  NOTE: Admitted with pneumonia due to COVID. Spoke with patient over the phone about diabetes and home regimen for diabetes control. Patient reports being followed by PCP for diabetes management and currently taking Metformin XR 1000 mg QAM as an outpatient for diabetes control. Patient reports taking DM medications as prescribed.  Patient reports checking glucose 1 time per day and that it is usually in the mid 100's mg/dl in the morning.  Inquired about prior A1C and patient reports she is not sure what her last A1C value was. Discussed A1C results (8.2% on 06/13/20 ) and explained that current A1C indicates an average glucose of 189 mg/dl over the past 2-3 months. Discussed glucose and A1C goals. Discussed importance of checking CBGs and maintaining good CBG control to prevent  long-term and short-term complications. Discussed impact of nutrition, exercise, stress, sickness, and medications on diabetes control. Discussed how sickness and steroids are impacting glucose at this time.  Discussed carbohydrates, carbohydrate goals per day and meal, along with portion sizes. Patient states she drinks mainly water and regular Sprite but tries to limit foods high in carbohydrates. Encouraged patient to eliminate sugary beverages and try switching to Sprite Zero or Diet Sprite.  Encouraged patient to check glucose during the day randomly (in addition to fasting) to see if glucose is running higher during the day.  Explained that she may need adjustments with outpatient DM medications to improve DM control.   Patient verbalized understanding of information discussed and reports no further questions at this time related to diabetes.  Thanks, Orlando Penner, RN, MSN, CDE Diabetes Coordinator Inpatient Diabetes Program [phone] (Team Pager)

## 2020-06-15 NOTE — Care Management Important Message (Signed)
Important Message  Patient Details  Name: Chelsea Glass MRN: [id number] Date of Birth: 02/14/1956   Medicare Important Message Given:  Yes  Initial Medicare IM given by Patient Access Associate on 06/14/2020 at 10:09am.     Johnell Comings 06/15/2020, 11:22 AM

## 2020-06-15 NOTE — Progress Notes (Addendum)
Initial Nutrition Assessment  DOCUMENTATION CODES:   Morbid obesity  INTERVENTION:   Nepro Shake po TID, each supplement provides 425 kcal and 19 grams protein  MVI daily   Pt at high refeed risk; recommend monitor K, Mg and P labs daily as oral intake improves.   NUTRITION DIAGNOSIS:   Increased nutrient needs related to catabolic illness (COVID 19) as evidenced by estimated needs.  GOAL:   Patient will meet greater than or equal to 90% of their needs  MONITOR:   PO intake, Supplement acceptance, Labs, Weight trends, Skin, I & O's  REASON FOR ASSESSMENT:   Consult Diet education  ASSESSMENT:   64 y.o. female with medical history significant for hypertension, diabetes mellitus, arthritis, morbid obesity, was brought to the hospital because of altered mental status and found to have sepsis and COVID 19 PNA  Unable to reach pt by phone. RD suspects pt with poor appetite and oral intake for at least 5 days pta r/t COVID 19. Per chart, pt down 24lbs(8%) since April; RD unsure how recently weight loss occurred. RD will add supplements and MVI to help pt meet her estimated needs. Pt is at high refeed risk.   RD consulted for diabetes diet education. Pt not appropriate for education at this time. RD will attempt to provides diabetes education prior to discharge and once medically appropriate.   Medications reviewed and include: vitamin C, lovenox, insulin, solu-medrol, zinc, NaCl @40ml /hr  Labs reviewed: K 3.7 wnl, P 4.2 wnl, Mg 2.5(H) Alk phos 462(H), AST 61(H), 86(H) Wbc 10.8(H) cbgs- 124, 144, 161 x 24 hrs AIC 8.2(H)- 9/6  NUTRITION - FOCUSED PHYSICAL EXAM: Unable to perform at this time as pt with COVID 19  Diet Order:   Diet Order            Diet Carb Modified Fluid consistency: Thin; Room service appropriate? Yes  Diet effective now                EDUCATION NEEDS:   Not appropriate for education at this time  Skin:  Skin Assessment: Reviewed RN Assessment  (Ecchymosis)  Last BM:  9/6  Height:   Ht Readings from Last 1 Encounters:  06/12/20 5' 6"  (1.676 m)    Weight:   Wt Readings from Last 1 Encounters:  06/15/20 131.3 kg    Ideal Body Weight:  59 kg  BMI:  Body mass index is 46.72 kg/m.  Estimated Nutritional Needs:   Kcal:  2600-2900kcal/day  Protein:  >130g/day  Fluid:  1.8-2.1L/day  Koleen Distance MS, RD, LDN Please refer to [facility] for RD and/or RD on-call/weekend/after hours pager

## 2020-06-15 NOTE — Progress Notes (Signed)
PROGRESS NOTE    Chelsea Glass  DJT:[id number] DOB: 12-30-1955 DOA: 06/12/2020 PCP: [facility]    Brief Narrative:     Assessment & Plan:   Active Problems:   Pneumonia due to COVID-19 virus   Acute respiratory failure with hypoxia (HCC)   Obesity, Class III, BMI 40-49.9 (morbid obesity) (HCC)   Severe sepsis (HCC)   New onset atrial fibrillation (HCC)   Brought to the hospital because of altered mental status. History was obtained from ED physician and chart review. Patient is unable to provide an adequate history. Reportedly, EMS was called because patient had been laying in bed for about 5 days without getting up. Apparently, she had been laying in herfeces and urine. Per report, EMS had been called to the house about 4 days prior to admission but patient had refused transport to the hospital at that time.Brought to the hospital because of altered mental status. History was obtained from ED physician and chart review. Patient is unable to provide an adequate history. Reportedly, EMS was called because patient had been laying in bed for about 5 days without getting up. Apparently, she had been laying in herfeces and urine. Per report, EMS had been called to the house about 4 days prior to admission but patient had refused transport to the hospital at that time.  DVT prophylaxis: (Lovenox/Heparin/SCD's/anticoagulated/None (if comfort care) Code Status: (Full/Partial - specify details) Family Communication: (Specify name, relationship & date discussed. NO "discussed with patient") Disposition Plan: (specify when and where you expect patient to be discharged). Include barriers to DC in this tab.   Status is: Inpatient Remains inpatient appropriate because:Inpatient level of care appropriate due to severity of illness  Dispo: The patient is from: Home              Anticipated d/c is to: Home.              Anticipated d/c date is: 1 day              Patient  currently is not medically stable to d/c.  Subjective: 9/7 afebrile overnight A/O x4, positive S OB, negative CP, negative abdominal pain, negative nausea, negative vomiting, negative diarrhea.  9/8 Pt seen today she is alert and answers questions appropriately.  Pt's oxygen has been in high 80's and 90's pt advised for proning and her sats came up. We will also get PT on board.  Objective: Vitals:   06/14/20 2347 06/15/20 0427 06/15/20 0539 06/15/20 0742  BP: 138/78 (!) 136/103  (!) 145/77  Pulse: 76 79  86  Resp: 16 19  (!) 21  Temp: 97.7 F (36.5 C) (!) 97.5 F (36.4 C)  (!) 97.5 F (36.4 C)  TempSrc: Oral   Oral  SpO2: 91% (!) 87%  (!) 89%  Weight:  126.9 kg 131.3 kg   Height:        Intake/Output Summary (Last 24 hours) at 06/15/2020 0934 Last data filed at 06/15/2020 0430 Gross per 24 hour  Intake 1107.98 ml  Output 500 ml  Net 607.98 ml   Filed Weights   06/14/20 0511 06/15/20 0427 06/15/20 0539  Weight: 125.8 kg 126.9 kg 131.3 kg   Examination: Blood pressure 131/77, pulse 79, temperature 97.7 F (36.5 C), temperature source Oral, resp. rate 20, height 5\' 6"  (1.676 m), weight 131.3 kg, SpO2 93 %.  General exam: Appears calm and comfortable  Respiratory system: BL fine crackles in both lungs posteriorly.  Cardiovascular system: S1 &  S2 heard, RRR. No JVD, murmurs, rubs, gallops or clicks. No pedal edema. Gastrointestinal system: Abdomen is nondistended, soft and nontender. No organomegaly or masses felt. Normal bowel sounds heard. Central nervous system: Alert and oriented. No focal neurological deficits. Extremities: Symmetric 5 x 5 power. Skin: No rashes, lesions or ulcers Psychiatry: Judgement and insight appear normal. Mood & affect appropriate.   Data Reviewed: I have personally reviewed following labs and imaging studies  CBC: Recent Labs  Lab 06/12/20 1551 06/13/20 1044 06/14/20 0510 06/15/20 0404  WBC 5.3 4.5 7.6 10.8*  NEUTROABS 2.7 2.2 5.4 8.5*   HGB 15.4* 14.9 14.4 14.4  HCT 47.5* 43.9 42.3 43.6  MCV 84.2 81.4 82.3 82.0  PLT 191 182 187 212   Basic Metabolic Panel: Recent Labs  Lab 06/12/20 1551 06/13/20 1044 06/14/20 0510 06/15/20 0404  NA 136 137 134* 138  K 3.7 3.8 4.0 3.7  CL 98 97* 99 104  CO2 21* 27 25 23   GLUCOSE 424* 396* 194* 122*  BUN 25* 28* 35* 41*  CREATININE 1.10* 0.99 1.31* 1.14*  CALCIUM 9.0 8.8* 9.3 9.4  MG 1.9 1.9 2.3 2.5*  PHOS  --  3.1 4.1 4.2   GFR: Estimated Creatinine Clearance: 68.4 mL/min (A) (by C-G formula based on SCr of 1.14 mg/dL (H)). Liver Function Tests: Recent Labs  Lab 06/12/20 1551 06/13/20 1044 06/14/20 0510 06/15/20 0404  AST 79* 61* 59* 61*  ALT 108* 95* 84* 86*  ALKPHOS 558* 493* 462* 462*  BILITOT 0.8 0.7 0.6 0.8  PROT 7.0 6.8 6.8 6.6  ALBUMIN 3.0* 2.9* 2.8* 2.8*    Recent Labs    06/13/20 1044 06/13/20 1321  HGBA1C 8.2* 8.2*   CBG: Recent Labs  Lab 06/14/20 1654 06/14/20 2042 06/14/20 2344 06/15/20 0416 06/15/20 0744  GLUCAP 151* 115* 121* 124* 134*   Lipid Profile: Recent Labs    06/13/20 1044  CHOL 311*  HDL 32*  LDLCALC 234*  TRIG 225*  CHOLHDL 9.7  Anemia Panel: Recent Labs    06/14/20 0510 06/15/20 0404  FERRITIN 296 269   Sepsis Labs: Recent Labs  Lab 06/12/20 1552 06/13/20 1044 06/13/20 1321 06/14/20 0510 06/15/20 0404  PROCALCITON  --  0.35  --  0.28 0.12  LATICACIDVEN 2.8* 1.6 1.4  --   --     Recent Results (from the past 240 hour(s))  SARS Coronavirus 2 by RT PCR (hospital order, performed in [facility] hospital lab) Nasopharyngeal Nasopharyngeal Swab     Status: Abnormal   Collection Time: 06/12/20  3:21 PM   Specimen: Nasopharyngeal Swab  Result Value Ref Range Status   SARS Coronavirus 2 POSITIVE (A) NEGATIVE Final    Comment: RESULT CALLED TO, READ BACK BY AND VERIFIED WITH: MEGAN JONES AT 1634 06/12/20.PMF (NOTE) SARS-CoV-2 target nucleic acids are DETECTED  SARS-CoV-2 RNA is generally detectable in upper  respiratory specimens  during the acute phase of infection.  Positive results are indicative  of the presence of the identified virus, but do not rule out bacterial infection or co-infection with other pathogens not detected by the test.  Clinical correlation with patient history and  other diagnostic information is necessary to determine patient infection status.  The expected result is negative.  Fact Sheet for Patients:   08/12/20   Fact Sheet for Healthcare Providers:   BoilerBrush.com.cy    This test is not yet approved or cleared by the https://pope.com/ FDA and  has been authorized for detection and/or diagnosis of  SARS-CoV-2 by FDA under an Emergency Use Authorization (EUA).  This EUA will remain in effect (meaning this test c an be used) for the duration of  the COVID-19 declaration under Section 564(b)(1) of the Act, 21 U.S.C. section 360-bbb-3(b)(1), unless the authorization is terminated or revoked sooner.  Performed at [facility], [address] [place], [place] [address]   Blood culture (routine x 2)     Status: None (Preliminary result)   Collection Time: 06/12/20  3:21 PM   Specimen: BLOOD  Result Value Ref Range Status   Specimen Description BLOOD RIGHT FOREARM  Final   Special Requests   Final    BOTTLES DRAWN AEROBIC AND ANAEROBIC Blood Culture adequate volume   Culture   Final    NO GROWTH 2 DAYS Performed at [facility], [address], [place], [place] [address]    Report Status PENDING  Incomplete  Blood culture (routine x 2)     Status: None (Preliminary result)   Collection Time: 06/12/20  3:21 PM   Specimen: BLOOD  Result Value Ref Range Status   Specimen Description BLOOD LEFT HAND  Final   Special Requests   Final    BOTTLES DRAWN AEROBIC AND ANAEROBIC Blood Culture adequate volume   Culture   Final    NO GROWTH 2 DAYS Performed at [facility], [address], [place], [place] [address]    Report Status PENDING  Incomplete     Scheduled Meds: . vitamin C  500 mg Oral Daily  . carvedilol  12.5 mg Oral BID WC  . enoxaparin (LOVENOX) injection  0.5 mg/kg Subcutaneous Q24H  . insulin aspart  0-20 Units Subcutaneous Q4H  . insulin aspart  8 Units Subcutaneous TID WC  . insulin glargine  20 Units Subcutaneous BID  . Ipratropium-Albuterol  1 puff Inhalation Q6H  . melatonin  2.5 mg Oral QHS  . methylPREDNISolone (SOLU-MEDROL) injection  60 mg Intravenous Q12H  . zinc sulfate  220 mg Oral Daily   Continuous Infusions: . sodium chloride 40 mL/hr at 06/15/20 0844  . diltiazem (CARDIZEM) infusion Stopped (06/14/20 0255)  . remdesivir 100 mg in NS 100 mL 100 mg (06/15/20 0845)     LOS: 3 days    Gertha Calkin, MD [facility] Pager [phone] If 7PM-7AM, please contact night-coverage [email or web address] Password [facility] 06/15/2020, 9:34 AM

## 2020-06-15 NOTE — Plan of Care (Signed)
Mobility encouraged, patient able to dangle on side of bed today. Family updated throughout shift. Oxygen titrated up from 6L to 10L HFNC this morning, oxygen maintained 90 and above.      Problem: Education: Goal: Knowledge of General Education information will improve Description: Including pain rating scale, medication(s)/side effects and non-pharmacologic comfort measures Outcome: Progressing   Problem: Health Behavior/Discharge Planning: Goal: Ability to manage health-related needs will improve Outcome: Progressing   Problem: Clinical Measurements: Goal: Ability to maintain clinical measurements within normal limits will improve Outcome: Progressing Goal: Will remain free from infection Outcome: Progressing Goal: Diagnostic test results will improve Outcome: Progressing Goal: Respiratory complications will improve Outcome: Progressing Goal: Cardiovascular complication will be avoided Outcome: Progressing   Problem: Activity: Goal: Risk for activity intolerance will decrease Outcome: Progressing   Problem: Nutrition: Goal: Adequate nutrition will be maintained Outcome: Progressing   Problem: Coping: Goal: Level of anxiety will decrease Outcome: Progressing   Problem: Elimination: Goal: Will not experience complications related to bowel motility Outcome: Progressing Goal: Will not experience complications related to urinary retention Outcome: Progressing   Problem: Pain Managment: Goal: General experience of comfort will improve Outcome: Progressing   Problem: Safety: Goal: Ability to remain free from injury will improve Outcome: Progressing   Problem: Skin Integrity: Goal: Risk for impaired skin integrity will decrease Outcome: Progressing   Problem: Education: Goal: Knowledge of risk factors and measures for prevention of condition will improve Outcome: Progressing   Problem: Coping: Goal: Psychosocial and spiritual needs will be supported Outcome:  Progressing   Problem: Respiratory: Goal: Will maintain a patent airway Outcome: Progressing Goal: Complications related to the disease process, condition or treatment will be avoided or minimized Outcome: Progressing

## 2020-06-16 DIAGNOSIS — I10 Essential (primary) hypertension: Secondary | ICD-10-CM

## 2020-06-16 LAB — COMPREHENSIVE METABOLIC PANEL
ALT: 81 U/L — ABNORMAL HIGH (ref 0–44)
AST: 43 U/L — ABNORMAL HIGH (ref 15–41)
Albumin: 2.6 g/dL — ABNORMAL LOW (ref 3.5–5.0)
Alkaline Phosphatase: 411 U/L — ABNORMAL HIGH (ref 38–126)
Anion gap: 10 (ref 5–15)
BUN: 44 mg/dL — ABNORMAL HIGH (ref 8–23)
CO2: 25 mmol/L (ref 22–32)
Calcium: 9.4 mg/dL (ref 8.9–10.3)
Chloride: 104 mmol/L (ref 98–111)
Creatinine, Ser: 1.1 mg/dL — ABNORMAL HIGH (ref 0.44–1.00)
GFR calc Af Amer: >60 mL/min (ref >60)
GFR calc non Af Amer: 53 mL/min — ABNORMAL LOW (ref >60)
Glucose, Bld: 127 mg/dL — ABNORMAL HIGH (ref 70–99)
Potassium: 4.4 mmol/L (ref 3.5–5.1)
Sodium: 139 mmol/L (ref 135–145)
Total Bilirubin: 1 mg/dL (ref 0.3–1.2)
Total Protein: 6.3 g/dL — ABNORMAL LOW (ref 6.5–8.1)

## 2020-06-16 LAB — CBC WITH DIFFERENTIAL/PLATELET
Abs Immature Granulocytes: 0.11 10*3/uL — ABNORMAL HIGH (ref 0.00–0.07)
Basophils Absolute: 0 10*3/uL (ref 0.0–0.1)
Basophils Relative: 0 %
Eosinophils Absolute: 0 10*3/uL (ref 0.0–0.5)
Eosinophils Relative: 0 %
HCT: 42.2 % (ref 36.0–46.0)
Hemoglobin: 14.3 g/dL (ref 12.0–15.0)
Immature Granulocytes: 1 %
Lymphocytes Relative: 10 %
Lymphs Abs: 1.1 10*3/uL (ref 0.7–4.0)
MCH: 27.5 pg (ref 26.0–34.0)
MCHC: 33.9 g/dL (ref 30.0–36.0)
MCV: 81.2 fL (ref 80.0–100.0)
Monocytes Absolute: 0.9 10*3/uL (ref 0.1–1.0)
Monocytes Relative: 8 %
Neutro Abs: 9.1 10*3/uL — ABNORMAL HIGH (ref 1.7–7.7)
Neutrophils Relative %: 81 %
Platelets: 204 10*3/uL (ref 150–400)
RBC: 5.2 MIL/uL — ABNORMAL HIGH (ref 3.87–5.11)
RDW: 14.1 % (ref 11.5–15.5)
WBC: 11.2 10*3/uL — ABNORMAL HIGH (ref 4.0–10.5)
nRBC: 0 % (ref 0.0–0.2)

## 2020-06-16 LAB — FERRITIN: Ferritin: 193 ng/mL (ref 11–307)

## 2020-06-16 LAB — GLUCOSE, CAPILLARY
Glucose-Capillary: 116 mg/dL — ABNORMAL HIGH (ref 70–99)
Glucose-Capillary: 121 mg/dL — ABNORMAL HIGH (ref 70–99)
Glucose-Capillary: 124 mg/dL — ABNORMAL HIGH (ref 70–99)
Glucose-Capillary: 127 mg/dL — ABNORMAL HIGH (ref 70–99)
Glucose-Capillary: 131 mg/dL — ABNORMAL HIGH (ref 70–99)
Glucose-Capillary: 169 mg/dL — ABNORMAL HIGH (ref 70–99)
Glucose-Capillary: 94 mg/dL (ref 70–99)

## 2020-06-16 LAB — MAGNESIUM: Magnesium: 2.5 mg/dL — ABNORMAL HIGH (ref 1.7–2.4)

## 2020-06-16 LAB — C-REACTIVE PROTEIN: CRP: 1.3 mg/dL — ABNORMAL HIGH (ref <1.0)

## 2020-06-16 LAB — HIV-1 RNA QUANT-NO REFLEX-BLD
HIV 1 RNA Quant: <20 copies/mL
LOG10 HIV-1 RNA: UNDETERMINED log10copy/mL

## 2020-06-16 LAB — FIBRIN DERIVATIVES D-DIMER (ARMC ONLY): Fibrin derivatives D-dimer (ARMC): 534.83 ng/mL (FEU) — ABNORMAL HIGH (ref 0.00–499.00)

## 2020-06-16 LAB — PHOSPHORUS: Phosphorus: 4.5 mg/dL (ref 2.5–4.6)

## 2020-06-16 NOTE — Progress Notes (Signed)
PROGRESS NOTE    Chelsea Glass  WLS:[id number] DOB: 1955/10/22 DOA: 06/12/2020 PCP: [facility] [place]    Brief Narrative:  Brought to the hospital because of altered mental status. History was obtained from ED physician and chart review. Patient is unable to provide an adequate history. Reportedly, EMS was called because patient had been laying in bed for about 5 days without getting up. Apparently, she had been laying in herfeces and urine. Per report, EMS had been called to the house about 4 days prior to admission but patient had refused transport to the hospital at that time.Brought to the hospital because of altered mental status. History was obtained from ED physician and chart review. Patient is unable to provide an adequate history. Reportedly, EMS was called because patient had been laying in bed for about 5 days without getting up. Apparently, she had been laying in herfeces and urine. Per report, EMS had been called to the house about 4 days prior to admission but patient had refused transport to the hospital at that time.   Assessment & Plan:   Active Problems:   Pneumonia due to COVID-19 virus   Acute respiratory failure with hypoxia (HCC)   Obesity, Class III, BMI 40-49.9 (morbid obesity) (HCC)   Severe sepsis ([place])   New onset atrial fibrillation (HCC) Severe sepsis- resolved.  -On admission patient met criteria for severe sepsis HR> 90,RR> 20, respiratory distress, lactic acid> 2 -Lactic acidosis has resolved post fluid resuscitation -Normal saline 56m/hr -Lactic acidosis resolved  Acute respiratory failure with hypoxia/Covid pneumonia COVID-19 Labs:  Recent Labs    06/14/20 0510 06/15/20 0404 06/16/20 0658  FERRITIN 296 269 193  CRP 4.4* 2.2* 1.3*    Lab Results  Component Value Date   SARSCOV2NAA POSITIVE (A) 06/12/2020   SARSCOV2NAA NEGATIVE 07/31/2019    -Solu-Medrol 60 mg BID -Remdesivir per pharmacy protocol. --Anticoagulation  with Lovenox 62.5 mg subcu every 24. -Vitamin C and zinc per Covid protocol -Incentive spirometry -Flutter valve  -Respimat QID SpO2: 90 % O2 Flow Rate (L/min): 12 L/min Dw/ daughter about moms intermittent hypoxia and D/W RT and nurse about plan for pt to keep her oxygen on but HFNC, d/w RT and Dr.Kasa about plan for possible bipap and guarded condition in case she needs to be transferred to ICU.    Diabetes type 2 uncontrolled with hyperglycemia -Lantus 20 units BID -NovoLog 8 units qac -Resistant SSI -9/6 Hemoglobin A1c= 8.2 -Consult diabetic coordinator; obese uncontrolled diabetic requires teaching -Consult diabetic nutritionist obese uncontrolled diabetic requires teaching  HLD -9/6 LDL= 234 -As soon as patient completes treatment for Covid would start patient on Lipitor 40 mg daily  Essential HTN -Coreg 12.5 mg BID -Cardizem drip (discontinued), patient now in NSR --Patient continued on Coreg as mentioned above, home regimen of chlorthalidone and Aldactone have been discontinued.  New onset A. fib -See HTN  Hypokalemia -Potassium goal> 4 --will recheck and follow.   Hypomagnesmia -Magnesium goal> 2 - will recheck and follow.   Hypophosphatemia -Phosphorus goal> 2.5  Elevated liver enzymes -We will need to monitor closely but appear to be only slightly elevated and stable --current ast/alt of 43 and 81 and is improving.   DVT prophylaxis: Lovenox.  Code Status: Full Family Communication: delores daughter spoke to her about moms guarded prognosis.  Disposition Plan: d/c to STR in one week .  Status is: Inpatient Remains inpatient appropriate because:Inpatient level of care appropriate due to severity of illness  Dispo: The patient is  from: Home              Anticipated d/c is to: Home.              Anticipated d/c date is: 1 day              Patient currently is not medically stable to d/c.  Subjective: 9/7 afebrile overnight A/O x4, positive S  OB, negative CP, negative abdominal pain, negative nausea, negative vomiting, negative diarrhea.  9/8 Pt seen today she is alert and answers questions appropriately.  Pt's oxygen has been in high 80's and 90's pt advised for proning and her sats came up. We will also get PT on board.  9/9 Pt is alert and awake but appears confused intermittently and slow to answer.  D/W daughter about mom today and we will try to get her oxygen level up and if needed intubate. Spoke to nick RT and pt is not in distress and follows commands. Pt denies any complaints although  It is difficult to assess if she has poor insight , she is confused from covid-19 infection.    Objective: Vitals:   06/16/20 1224 06/16/20 1638 06/16/20 1645 06/16/20 1702  BP:  (!) 153/95    Pulse:  81  79  Resp:  (!) 25  (!) 23  Temp:      TempSrc:      SpO2: (!) 89% (!) 86% (!) 89% 90%  Weight:      Height:        Intake/Output Summary (Last 24 hours) at 06/16/2020 1724 Last data filed at 06/16/2020 0843 Gross per 24 hour  Intake 1205.53 ml  Output 400 ml  Net 805.53 ml   Filed Weights   06/15/20 0427 06/15/20 0539 06/16/20 0253  Weight: 126.9 kg 131.3 kg 126.6 kg   Examination: Blood pressure (!) 153/95, pulse 79, temperature (!) 97.4 F (36.3 C), temperature source Oral, resp. rate (!) 23, height _0  (1.676 m), weight 126.6 kg, SpO2 90 %.  General exam: Appears calm and comfortable  Respiratory system: BL fine crackles in both lungs posteriorly.  Cardiovascular system: S1 & S2 heard, RRR. No JVD, murmurs, rubs, gallops or clicks. No pedal edema. Gastrointestinal system: Abdomen is nondistended, soft and nontender. No organomegaly or masses felt. Normal bowel sounds heard. Central nervous system: Alert and oriented. No focal neurological deficits. Extremities:CN are grossly intact. Pt moving all ext.  Skin: No rashes, lesions or ulcers Psychiatry: Judgement and insight appear normal. Mood & affect appropriate.     Data Reviewed: I have personally reviewed following labs and imaging studies  CBC: Recent Labs  Lab 06/12/20 1551 06/13/20 1044 06/14/20 0510 06/15/20 0404 06/16/20 0658  WBC 5.3 4.5 7.6 10.8* 11.2*  NEUTROABS 2.7 2.2 5.4 8.5* 9.1*  HGB 15.4* 14.9 14.4 14.4 14.3  HCT 47.5* 43.9 42.3 43.6 42.2  MCV 84.2 81.4 82.3 82.0 81.2  PLT 191 182 187 212 333   Basic Metabolic Panel: Recent Labs  Lab 06/12/20 1551 06/13/20 1044 06/14/20 0510 06/15/20 0404 06/16/20 0658  NA 136 137 134* 138 139  K 3.7 3.8 4.0 3.7 4.4  CL 98 97* 99 104 104  CO2 21* _1 GLUCOSE 424* 396* 194* 122* 127*  BUN 25* 28* 35* 41* 44*  CREATININE 1.10* 0.99 1.31* 1.14* 1.10*  CALCIUM 9.0 8.8* 9.3 9.4 9.4  MG 1.9 1.9 2.3 2.5* 2.5*  PHOS  --  3.1 4.1 4.2 4.5   GFR:  Estimated Creatinine Clearance: 69.4 mL/min (A) (by C-G formula based on SCr of 1.1 mg/dL (H)). Liver Function Tests: Recent Labs  Lab 06/12/20 1551 06/13/20 1044 06/14/20 0510 06/15/20 0404 06/16/20 0658  AST 79* 61* 59* 61* 43*  ALT 108* 95* 84* 86* 81*  ALKPHOS 558* 493* 462* 462* 411*  BILITOT 0.8 0.7 0.6 0.8 1.0  PROT 7.0 6.8 6.8 6.6 6.3*  ALBUMIN 3.0* 2.9* 2.8* 2.8* 2.6*    No results for input(s): HGBA1C in the last 72 hours. CBG: Recent Labs  Lab 06/16/20 0011 06/16/20 0348 06/16/20 0747 06/16/20 1148 06/16/20 1642  GLUCAP 131* 116* 121* 169* 127*   Lipid Profile: No results for input(s): CHOL, HDL, LDLCALC, TRIG, CHOLHDL, LDLDIRECT in the last 72 hours.Anemia Panel: Recent Labs    06/15/20 0404 06/16/20 0658  FERRITIN 269 193   Sepsis Labs: Recent Labs  Lab 06/12/20 1552 06/13/20 1044 06/13/20 1321 06/14/20 0510 06/15/20 0404  PROCALCITON  --  0.35  --  0.28 0.12  LATICACIDVEN 2.8* 1.6 1.4  --   --     Recent Results (from the past 240 hour(s))  SARS Coronavirus 2 by RT PCR (hospital order, performed in [place] hospital lab) Nasopharyngeal Nasopharyngeal Swab     Status: Abnormal    Collection Time: 06/12/20  3:21 PM   Specimen: Nasopharyngeal Swab  Result Value Ref Range Status   SARS Coronavirus 2 POSITIVE (A) NEGATIVE Final    Comment: RESULT CALLED TO, READ BACK BY AND VERIFIED WITH: MEGAN JONES AT 1634 06/12/20.PMF (NOTE) SARS-CoV-2 target nucleic acids are DETECTED  SARS-CoV-2 RNA is generally detectable in upper respiratory specimens  during the acute phase of infection.  Positive results are indicative  of the presence of the identified virus, but do not rule out bacterial infection or co-infection with other pathogens not detected by the test.  Clinical correlation with patient history and  other diagnostic information is necessary to determine patient infection status.  The expected result is negative.  Fact Sheet for Patients:   StrictlyIdeas.no   Fact Sheet for Healthcare Providers:   BankingDealers.co.za    This test is not yet approved or cleared by the [place] FDA and  has been authorized for detection and/or diagnosis of SARS-CoV-2 by FDA under an Emergency Use Authorization (EUA).  This EUA will remain in effect (meaning this test c an be used) for the duration of  the COVID-19 declaration under Section 564(b)(1) of the Act, 21 U.S.C. section 360-bbb-3(b)(1), unless the authorization is terminated or revoked sooner.  Performed at [facility], [address] [place], [place] [address]   Blood culture (routine x 2)     Status: None (Preliminary result)   Collection Time: 06/12/20  3:21 PM   Specimen: BLOOD  Result Value Ref Range Status   Specimen Description BLOOD RIGHT FOREARM  Final   Special Requests   Final    BOTTLES DRAWN AEROBIC AND ANAEROBIC Blood Culture adequate volume   Culture   Final    NO GROWTH 4 DAYS Performed at [facility], [address], [place], [place] [address]    Report Status PENDING  Incomplete  Blood culture (routine x 2)     Status:  None (Preliminary result)   Collection Time: 06/12/20  3:21 PM   Specimen: BLOOD  Result Value Ref Range Status   Specimen Description BLOOD LEFT HAND  Final   Special Requests   Final    BOTTLES DRAWN AEROBIC AND ANAEROBIC Blood  Culture adequate volume   Culture   Final    NO GROWTH 4 DAYS Performed at [facility], [address] [place], [place] [address]    Report Status PENDING  Incomplete     Scheduled Meds: . vitamin C  500 mg Oral Daily  . carvedilol  12.5 mg Oral BID WC  . enoxaparin (LOVENOX) injection  0.5 mg/kg Subcutaneous Q24H  . feeding supplement (NEPRO CARB STEADY)  237 mL Oral TID BM  . insulin aspart  0-20 Units Subcutaneous Q4H  . insulin aspart  8 Units Subcutaneous TID WC  . insulin glargine  20 Units Subcutaneous BID  . Ipratropium-Albuterol  1 puff Inhalation Q6H  . melatonin  2.5 mg Oral QHS  . methylPREDNISolone (SOLU-MEDROL) injection  60 mg Intravenous Q12H  . multivitamin with minerals  1 tablet Oral Daily  . zinc sulfate  220 mg Oral Daily   Continuous Infusions: . sodium chloride 40 mL/hr at 06/15/20 0844     LOS: 4 days    Para Skeans, MD [facility] Pager [phone] If 7PM-7AM, please contact night-coverage [email or web address] Password [facility] 06/16/2020, 5:24 PM

## 2020-06-16 NOTE — Progress Notes (Signed)
This nurse calls daughter Deloris with requested update on patient condition.

## 2020-06-17 ENCOUNTER — Inpatient Hospital Stay: Payer: Medicare Other

## 2020-06-17 ENCOUNTER — Encounter: Payer: Self-pay | Admitting: Internal Medicine

## 2020-06-17 DIAGNOSIS — I10 Essential (primary) hypertension: Secondary | ICD-10-CM

## 2020-06-17 LAB — CBC WITH DIFFERENTIAL/PLATELET
Abs Immature Granulocytes: 0.36 10*3/uL — ABNORMAL HIGH (ref 0.00–0.07)
Basophils Absolute: 0 10*3/uL (ref 0.0–0.1)
Basophils Relative: 0 %
Eosinophils Absolute: 0 10*3/uL (ref 0.0–0.5)
Eosinophils Relative: 0 %
HCT: 46.5 % — ABNORMAL HIGH (ref 36.0–46.0)
Hemoglobin: 15.5 g/dL — ABNORMAL HIGH (ref 12.0–15.0)
Immature Granulocytes: 3 %
Lymphocytes Relative: 10 %
Lymphs Abs: 1.4 10*3/uL (ref 0.7–4.0)
MCH: 27.2 pg (ref 26.0–34.0)
MCHC: 33.3 g/dL (ref 30.0–36.0)
MCV: 81.7 fL (ref 80.0–100.0)
Monocytes Absolute: 1 10*3/uL (ref 0.1–1.0)
Monocytes Relative: 7 %
Neutro Abs: 11.4 10*3/uL — ABNORMAL HIGH (ref 1.7–7.7)
Neutrophils Relative %: 80 %
Platelets: 257 10*3/uL (ref 150–400)
RBC: 5.69 MIL/uL — ABNORMAL HIGH (ref 3.87–5.11)
RDW: 14.2 % (ref 11.5–15.5)
WBC: 14.2 10*3/uL — ABNORMAL HIGH (ref 4.0–10.5)
nRBC: 0 % (ref 0.0–0.2)

## 2020-06-17 LAB — GLUCOSE, CAPILLARY
Glucose-Capillary: 104 mg/dL — ABNORMAL HIGH (ref 70–99)
Glucose-Capillary: 132 mg/dL — ABNORMAL HIGH (ref 70–99)
Glucose-Capillary: 207 mg/dL — ABNORMAL HIGH (ref 70–99)
Glucose-Capillary: 207 mg/dL — ABNORMAL HIGH (ref 70–99)
Glucose-Capillary: 229 mg/dL — ABNORMAL HIGH (ref 70–99)
Glucose-Capillary: 86 mg/dL (ref 70–99)

## 2020-06-17 LAB — CULTURE, BLOOD (ROUTINE X 2)
Culture: NO GROWTH
Culture: NO GROWTH
Special Requests: ADEQUATE
Special Requests: ADEQUATE

## 2020-06-17 LAB — PHOSPHORUS: Phosphorus: 3.2 mg/dL (ref 2.5–4.6)

## 2020-06-17 LAB — FIBRIN DERIVATIVES D-DIMER (ARMC ONLY)
Fibrin derivatives D-dimer (ARMC): 294.55 ng/mL (FEU) (ref 0.00–499.00)
Fibrin derivatives D-dimer (ARMC): 346.81 ng/mL (FEU) (ref 0.00–499.00)

## 2020-06-17 LAB — COMPREHENSIVE METABOLIC PANEL
ALT: 74 U/L — ABNORMAL HIGH (ref 0–44)
AST: 37 U/L (ref 15–41)
Albumin: 2.7 g/dL — ABNORMAL LOW (ref 3.5–5.0)
Alkaline Phosphatase: 399 U/L — ABNORMAL HIGH (ref 38–126)
Anion gap: 11 (ref 5–15)
BUN: 40 mg/dL — ABNORMAL HIGH (ref 8–23)
CO2: 23 mmol/L (ref 22–32)
Calcium: 9.4 mg/dL (ref 8.9–10.3)
Chloride: 107 mmol/L (ref 98–111)
Creatinine, Ser: 1.1 mg/dL — ABNORMAL HIGH (ref 0.44–1.00)
GFR calc Af Amer: >60 mL/min (ref >60)
GFR calc non Af Amer: 53 mL/min — ABNORMAL LOW (ref >60)
Glucose, Bld: 99 mg/dL (ref 70–99)
Potassium: 4.6 mmol/L (ref 3.5–5.1)
Sodium: 141 mmol/L (ref 135–145)
Total Bilirubin: 1 mg/dL (ref 0.3–1.2)
Total Protein: 6.4 g/dL — ABNORMAL LOW (ref 6.5–8.1)

## 2020-06-17 LAB — FERRITIN
Ferritin: 138 ng/mL (ref 11–307)
Ferritin: 170 ng/mL (ref 11–307)

## 2020-06-17 LAB — BLOOD GAS, ARTERIAL
Acid-Base Excess: 2.7 mmol/L — ABNORMAL HIGH (ref 0.0–2.0)
Bicarbonate: 26.2 mmol/L (ref 20.0–28.0)
FIO2: 12
O2 Saturation: 88.7 %
Patient temperature: 37
pCO2 arterial: 36 mmHg (ref 32.0–48.0)
pH, Arterial: 7.47 — ABNORMAL HIGH (ref 7.350–7.450)
pO2, Arterial: 52 mmHg — ABNORMAL LOW (ref 83.0–108.0)

## 2020-06-17 LAB — MAGNESIUM
Magnesium: 2.6 mg/dL — ABNORMAL HIGH (ref 1.7–2.4)
Magnesium: 2.6 mg/dL — ABNORMAL HIGH (ref 1.7–2.4)

## 2020-06-17 LAB — C-REACTIVE PROTEIN
CRP: 0.9 mg/dL (ref <1.0)
CRP: 1.1 mg/dL — ABNORMAL HIGH (ref <1.0)

## 2020-06-17 LAB — LACTATE DEHYDROGENASE: LDH: 321 U/L — ABNORMAL HIGH (ref 98–192)

## 2020-06-17 IMAGING — DX DG CHEST 1V PORT
1 series · 1 of 1 positions shown · non-contrast
Comparison: Portable exam [K6] hours compared to [DATE]

CLINICAL DATA: Respiratory failure with hypoxia, acute hypoxemic
respiratory failure secondary to [K6]

EXAM:
PORTABLE CHEST 1 VIEW

[chest ap]
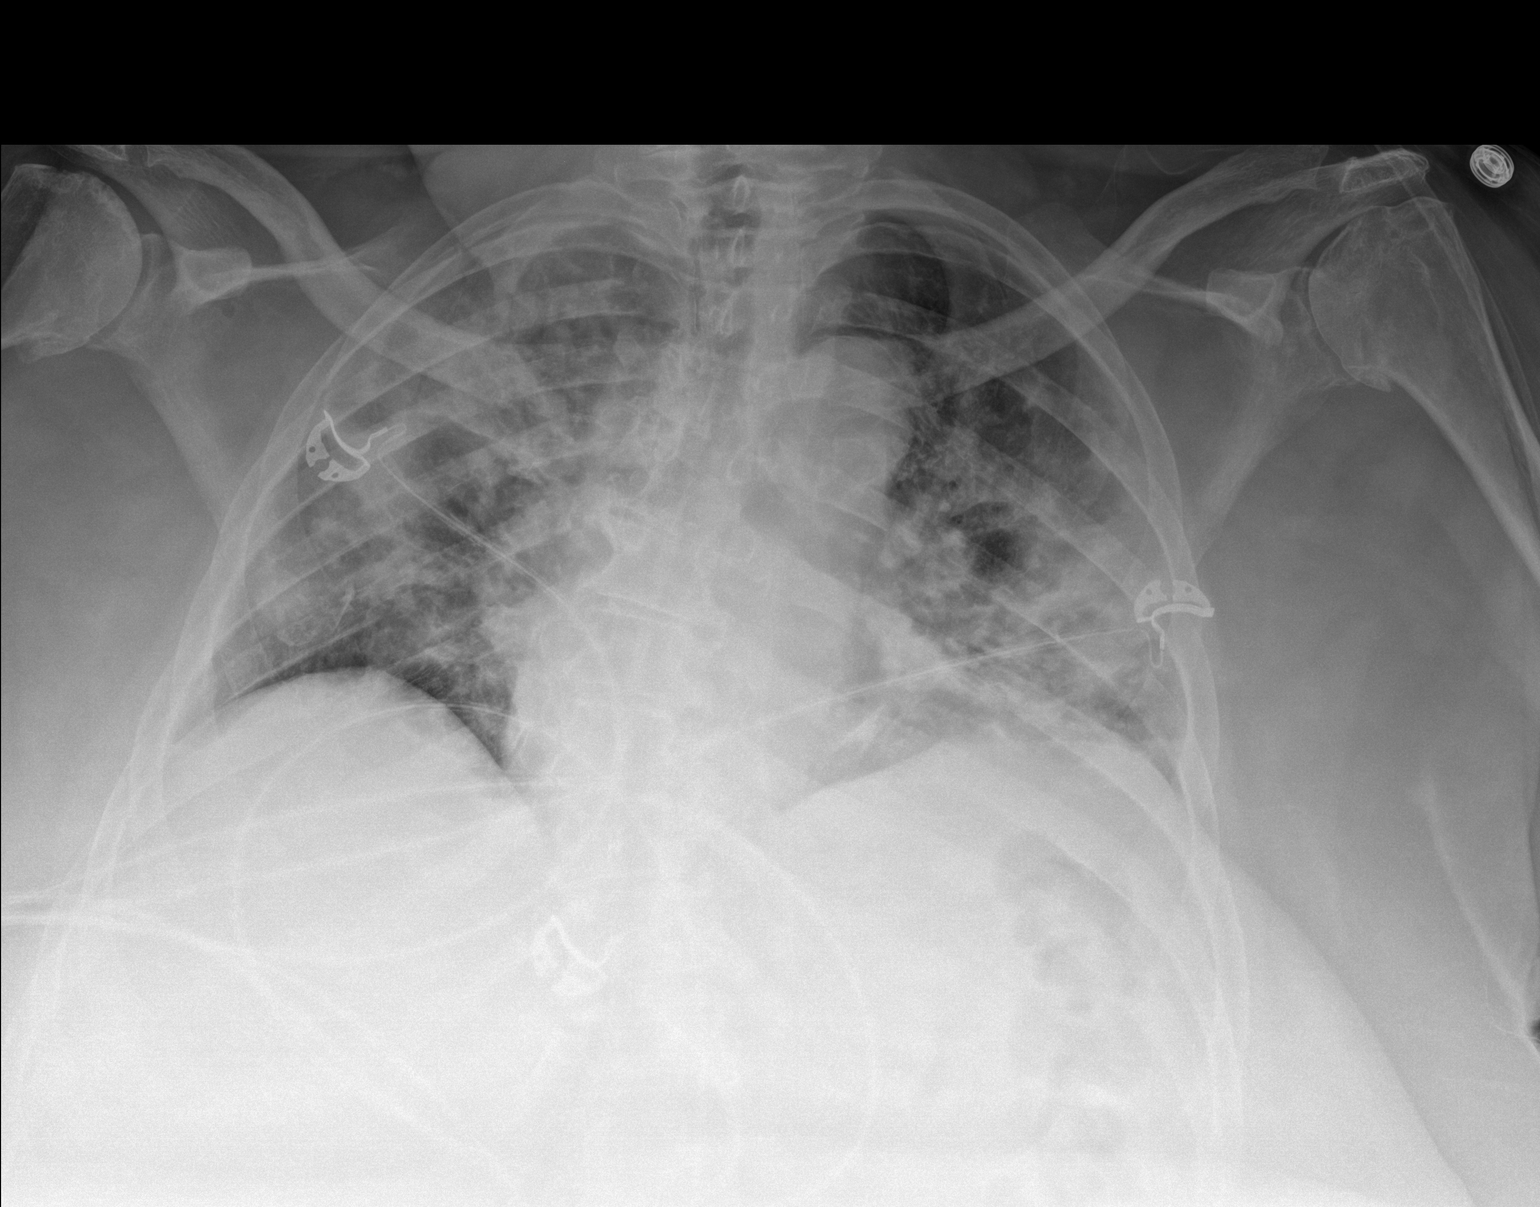

[1 of 1 positions shown; findings below may reference images not displayed]

FINDINGS: Normal heart size, mediastinal contours, and pulmonary vascularity.

Patchy airspace infiltrates bilaterally consistent with multifocal
pneumonia and [K6].

Infiltrates have increased since previous exam.

No pleural effusion or pneumothorax.

Scoliosis and degenerative disc disease changes of thoracic spine
with additional BILATERAL glenohumeral degenerative changes.
IMPRESSION: Increased patchy BILATERAL pulmonary infiltrates consistent with
multifocal pneumonia and [K6] infection.

## 2020-06-17 MED ORDER — SODIUM CHLORIDE 0.9 % IV SOLN
500.0000 mg | INTRAVENOUS | Status: AC
  Administered 2020-06-17 – 2020-06-21 (×5): 500 mg via INTRAVENOUS
  Filled 2020-06-17 (×5): qty 500

## 2020-06-17 MED ORDER — HYDROCHLOROTHIAZIDE 12.5 MG PO CAPS
12.5000 mg | ORAL_CAPSULE | Freq: Every day | ORAL | Status: DC
  Administered 2020-06-17: 12.5 mg via ORAL
  Filled 2020-06-17: qty 1

## 2020-06-17 MED ORDER — SODIUM CHLORIDE 0.9 % IV SOLN
1.0000 g | INTRAVENOUS | Status: DC
  Administered 2020-06-17: 1 g via INTRAVENOUS
  Filled 2020-06-17: qty 1
  Filled 2020-06-17: qty 10

## 2020-06-17 NOTE — Progress Notes (Addendum)
Patient seems very out of it.  She is alert and oriented x3 (disoriented to situation) however she is slow to respond with some commands and would not follow with other.  Patient continued to try to take off HFNC with her sats dropping to the high 70s.    Mitts placed, patient trying to take them off.     Explained to patient importance of leaving O2 on.     MD is aware - wants patient O2 sat to stay above 93%.  Will continue to reinforce.

## 2020-06-17 NOTE — Progress Notes (Addendum)
Through out the day patient continued to take off HFNC.  Patient's saturations would drop into the high 70s on RA.  When placed back on HFNC she would sustain O2 sat around 93-98% at 15L   Mitts still in place- patient able to take them off.      Reiterated importance of wearing her oxygen.  Will need to continue to reinforce and monitor O2 sats.      Continued to encourage patient to eat.  Was able to get small amounts of nephro in with medications.

## 2020-06-17 NOTE — Progress Notes (Signed)
PROGRESS NOTE    Chelsea Glass  YFR:[id number] DOB: 01/29/1956 DOA: 06/12/2020 PCP: [facility]    Brief Narrative:  Brought to the hospital because of altered mental status. History was obtained from ED physician and chart review. Patient is unable to provide an adequate history. Reportedly, EMS was called because patient had been laying in bed for about 5 days without getting up. Apparently, she had been laying in herfeces and urine. Per report, EMS had been called to the house about 4 days prior to admission but patient had refused transport to the hospital at that time.Brought to the hospital because of altered mental status. History was obtained from ED physician and chart review. Patient is unable to provide an adequate history. Reportedly, EMS was called because patient had been laying in bed for about 5 days without getting up. Apparently, she had been laying in herfeces and urine. Per report, EMS had been called to the house about 4 days prior to admission but patient had refused transport to the hospital at that time.   Assessment & Plan:   Active Problems:   Pneumonia due to COVID-19 virus   Acute respiratory failure with hypoxia (HCC)   Obesity, Class III, BMI 40-49.9 (morbid obesity) (HCC)   Severe sepsis ([place])   New onset atrial fibrillation (HCC)   Hypertension Severe sepsis- resolved.  -On admission patient met criteria for severe sepsis HR> 90,RR> 20, respiratory distress, lactic acid> 2 -Lactic acidosis has resolved post fluid resuscitation -Normal saline 42m/hr -Lactic acidosis resolved  Acute respiratory failure with hypoxia/Covid pneumonia COVID-19 Labs:  Recent Labs    06/15/20 0404 06/15/20 0404 06/16/20 0658 06/17/20 0502 06/17/20 1014  FERRITIN 269   < > 193 138 170  LDH  --   --   --   --  321*  CRP 2.2*  --  1.3*  --   --    < > = values in this interval not displayed.    Lab Results  Component Value Date   SARSCOV2NAA  POSITIVE (A) 06/12/2020   SARSCOV2NAA NEGATIVE 07/31/2019    -Solu-Medrol 60 mg BID -Remdesivir per pharmacy protocol. --Anticoagulation with Lovenox 62.5 mg subcu every 24. -Vitamin C and zinc per Covid protocol -Incentive spirometry -Flutter valve  -Respimat QID 9/8: pt is sating in high 80's no distress / alert and awake but slow to answer seem to perseverate with answers.  9/9 SpO2: 90 % O2 Flow Rate (L/min): 15 L/min Dw/ daughter about moms intermittent hypoxia and D/W RT and nurse about plan for pt to keep her oxygen on but HFNC, d/w RT and Dr.Kasa about plan for possible bipap and guarded condition in case she needs to be transferred to ICU.   -9/10: SpO2: 90 % O2 Flow Rate (L/min): 15 L/min Pt seen today she is in no distress and oxygenating better with 15 L HFNC. spoke to daughter about starting baricitnab and she declined. Remdesivir  Therapy completed yesterday.  Pt also started on CAP coverage due to progressive infiltrates on chest xray.  covid labs pending. Currently pt stable on HFNC.  Diabetes type 2 uncontrolled with hyperglycemia -Lantus 20 units BID -NovoLog 8 units qac -Resistant SSI -9/6 Hemoglobin A1c= 8.2 -Consult diabetic coordinator; obese uncontrolled diabetic requires teaching -Consult diabetic nutritionist obese uncontrolled diabetic requires teaching -- NO change cont ssi/ and accuchecks.  HLD -9/6 LDL= 234 -As soon as patient completes treatment for Covid would start patient on Lipitor 40 mg daily.  Essential HTN -Coreg  12.5 mg BID -Cardizem drip (discontinued), patient now in NSR --Patient continued on Coreg as mentioned above, home regimen of chlorthalidone and Aldactone have been discontinued. --will restart diuretic therapy with hctz at 12.5 mg as well and uptitrate.   New onset A. fib -See HTN --continue coreg pt in sinus rhythm now.   Hypokalemia -Potassium goal> 4 --will recheck and follow.   Hypomagnesmia -Magnesium goal>  2 - will recheck and follow.   Hypophosphatemia -Phosphorus goal> 2.5 We will follow levels.   Elevated liver enzymes -We will need to monitor closely but appear to be only slightly elevated and stable --current ast/alt of 43 and 81 and is improving.  Currently improving 37/ 74.  ~~~~~~~~~~~~~~~~~~~~~~~~~~~~~~~~~~~~~~~~~~~~~~~~~~~~~~~~~~~~~~~~~~~~~  DVT prophylaxis: Lovenox.  Code Status: Full Family Communication: delores daughter spoke to her about moms guarded prognosis.  Disposition Plan: d/c to STR in one week .  Status is: Inpatient Remains inpatient appropriate because:Inpatient level of care appropriate due to severity of illness  Dispo: The patient is from: Home              Anticipated d/c is to: Home vs STR              Anticipated d/c date is: 5 days              Patient currently is not medically stable to d/c.   ~~~~~~~~~~~~~~~~~~~~~~~~~~~~~~~~~~~~~~~~~~~~~~~~~~~~~~~~~~~~~~~~~~~~~   Subjective:  9/7 afebrile overnight A/O x4, positive S OB, negative CP, negative abdominal pain, negative nausea, negative vomiting, negative diarrhea.  9/8 Pt seen today she is alert and answers questions appropriately.  Pt's oxygen has been in high 80's and 90's pt advised for proning and her sats came up. We will also get PT on board.  9/9 Pt is alert and awake but appears confused intermittently and slow to answer.  D/W daughter about mom today and we will try to get her oxygen level up and if needed intubate. Spoke to nick RT and pt is not in distress and follows commands. Pt denies any complaints although  It is difficult to assess if she has poor insight , she is confused from covid-19 infection.   9/10 Pt seen today she is again awake and alert but confused somewhat. She was pulling out her oxygen last night and has soft mitten placed. Pt is hypoxic on ABG today d/w nurse about HFNC and d/w intensivist and mid 54 sat are goal. Labs show improving lft. Repeat chest xray  shows increasing bl infiltrates and spoke to daughter about baricitnab and risk and benefit and she declined  T/t with it. BP is high we will have a manual repeat and prn hydralazine in case it is still high.hbis stable and kidney function is improving.  COVID-19 Labs  Recent Labs    06/15/20 0404 06/15/20 0404 06/16/20 0658 06/17/20 0502 06/17/20 1014  FERRITIN 269   < > 193 138 170  LDH  --   --   --   --  321*  CRP 2.2*  --  1.3*  --   --    < > = values in this interval not displayed.    Lab Results  Component Value Date   SARSCOV2NAA POSITIVE (A) 06/12/2020   [place] NEGATIVE 07/31/2019     Objective: Vitals:   06/16/20 1702 06/16/20 1957 06/17/20 0338 06/17/20 0837  BP:   (!) 152/89 (!) 171/101  Pulse: 79  70 78  Resp: (!) 23  (!) 21 20  Temp:  98.4  F (36.9 C) 98.1 F (36.7 C) 98.1 F (36.7 C)  TempSrc:  Oral Oral Oral  SpO2: 90% (!) 82% (!) 87% 90%  Weight:      Height:        Intake/Output Summary (Last 24 hours) at 06/17/2020 1121 Last data filed at 06/17/2020 1040 Gross per 24 hour  Intake 3610.36 ml  Output 550 ml  Net 3060.36 ml   Filed Weights   06/15/20 0427 06/15/20 0539 06/16/20 0253  Weight: 126.9 kg 131.3 kg 126.6 kg   Examination: Blood pressure (!) 171/101, pulse 78, temperature 98.1 F (36.7 C), temperature source Oral, resp. rate 20, height _0  (1.676 m), weight 126.6 kg, SpO2 90 %.  General exam: Appears calm and comfortable  Respiratory system: BL Rhonchi in both lungs posteriorly.  Cardiovascular system: S1 & S2 heard, RRR. No JVD, murmurs, rubs, gallops or clicks. No pedal edema. Gastrointestinal system: Abdomen is nondistended, soft and nontender. No organomegaly or masses felt. Normal bowel sounds heard. Central nervous system: Alert and oriented. No focal neurological deficits. Extremities:CN are grossly intact. Pt moving all ext.  Skin: No rashes, lesions or ulcers Psychiatry: Judgement and insight appear normal. Mood &  affect appropriate.   Data Reviewed: I have personally reviewed following labs and imaging studies  CBC: Recent Labs  Lab 06/13/20 1044 06/14/20 0510 06/15/20 0404 06/16/20 0658 06/17/20 0502  WBC 4.5 7.6 10.8* 11.2* 14.2*  NEUTROABS 2.2 5.4 8.5* 9.1* 11.4*  HGB 14.9 14.4 14.4 14.3 15.5*  HCT 43.9 42.3 43.6 42.2 46.5*  MCV 81.4 82.3 82.0 81.2 81.7  PLT 182 187 212 204 264   Basic Metabolic Panel: Recent Labs  Lab 06/13/20 1044 06/13/20 1044 06/14/20 0510 06/15/20 0404 06/16/20 0658 06/17/20 0502 06/17/20 1014  NA 137  --  134* 138 139 141  --   K 3.8  --  4.0 3.7 4.4 4.6  --   CL 97*  --  99 104 104 107  --   CO2 27  --  _1 --   GLUCOSE 396*  --  194* 122* 127* 99  --   BUN 28*  --  35* 41* 44* 40*  --   CREATININE 0.99  --  1.31* 1.14* 1.10* 1.10*  --   CALCIUM 8.8*  --  9.3 9.4 9.4 9.4  --   MG 1.9   < > 2.3 2.5* 2.5* 2.6* 2.6*  PHOS 3.1  --  4.1 4.2 4.5 3.2  --    < > = values in this interval not displayed.   GFR: Estimated Creatinine Clearance: 69.4 mL/min (A) (by C-G formula based on SCr of 1.1 mg/dL (H)). Liver Function Tests: Recent Labs  Lab 06/13/20 1044 06/14/20 0510 06/15/20 0404 06/16/20 0658 06/17/20 0502  AST 61* 59* 61* 43* 37  ALT 95* 84* 86* 81* 74*  ALKPHOS 493* 462* 462* 411* 399*  BILITOT 0.7 0.6 0.8 1.0 1.0  PROT 6.8 6.8 6.6 6.3* 6.4*  ALBUMIN 2.9* 2.8* 2.8* 2.6* 2.7*    No results for input(s): HGBA1C in the last 72 hours. CBG: Recent Labs  Lab 06/16/20 1642 06/16/20 2030 06/16/20 2318 06/17/20 0338 06/17/20 0806  GLUCAP 127* 124* 94 86 104*   Lipid Profile: No results for input(s): CHOL, HDL, LDLCALC, TRIG, CHOLHDL, LDLDIRECT in the last 72 hours.Anemia Panel: Recent Labs    06/17/20 0502 06/17/20 1014  FERRITIN 138 170   Sepsis Labs: Recent Labs  Lab 06/12/20 1552 06/13/20 1044  06/13/20 1321 06/14/20 0510 06/15/20 0404  PROCALCITON  --  0.35  --  0.28 0.12  LATICACIDVEN 2.8* 1.6 1.4  --   --      Recent Results (from the past 240 hour(s))  SARS Coronavirus 2 by RT PCR (hospital order, performed in [facility] hospital lab) Nasopharyngeal Nasopharyngeal Swab     Status: Abnormal   Collection Time: 06/12/20  3:21 PM   Specimen: Nasopharyngeal Swab  Result Value Ref Range Status   SARS Coronavirus 2 POSITIVE (A) NEGATIVE Final    Comment: RESULT CALLED TO, READ BACK BY AND VERIFIED WITH: MEGAN JONES AT 1634 06/12/20.PMF (NOTE) SARS-CoV-2 target nucleic acids are DETECTED  SARS-CoV-2 RNA is generally detectable in upper respiratory specimens  during the acute phase of infection.  Positive results are indicative  of the presence of the identified virus, but do not rule out bacterial infection or co-infection with other pathogens not detected by the test.  Clinical correlation with patient history and  other diagnostic information is necessary to determine patient infection status.  The expected result is negative.  Fact Sheet for Patients:   StrictlyIdeas.no   Fact Sheet for Healthcare Providers:   BankingDealers.co.za    This test is not yet approved or cleared by the [place] FDA and  has been authorized for detection and/or diagnosis of SARS-CoV-2 by FDA under an Emergency Use Authorization (EUA).  This EUA will remain in effect (meaning this test c an be used) for the duration of  the COVID-19 declaration under Section 564(b)(1) of the Act, 21 U.S.C. section 360-bbb-3(b)(1), unless the authorization is terminated or revoked sooner.  Performed at [facility] [address] [place], [place] [address]   Blood culture (routine x 2)     Status: None   Collection Time: 06/12/20  3:21 PM   Specimen: BLOOD  Result Value Ref Range Status   Specimen Description BLOOD RIGHT FOREARM  Final   Special Requests   Final    BOTTLES DRAWN AEROBIC AND ANAEROBIC Blood Culture adequate volume   Culture   Final    NO  GROWTH 5 DAYS Performed at [facility], [address], [place], [place] [address]    Report Status 06/17/2020 FINAL  Final  Blood culture (routine x 2)     Status: None   Collection Time: 06/12/20  3:21 PM   Specimen: BLOOD  Result Value Ref Range Status   Specimen Description BLOOD LEFT HAND  Final   Special Requests   Final    BOTTLES DRAWN AEROBIC AND ANAEROBIC Blood Culture adequate volume   Culture   Final    NO GROWTH 5 DAYS Performed at [facility], [address] [place], [place] [address]    Report Status 06/17/2020 FINAL  Final     Scheduled Meds: . vitamin C  500 mg Oral Daily  . carvedilol  12.5 mg Oral BID WC  . enoxaparin (LOVENOX) injection  0.5 mg/kg Subcutaneous Q24H  . feeding supplement (NEPRO CARB STEADY)  237 mL Oral TID BM  . insulin aspart  0-20 Units Subcutaneous Q4H  . insulin aspart  8 Units Subcutaneous TID WC  . insulin glargine  20 Units Subcutaneous BID  . Ipratropium-Albuterol  1 puff Inhalation Q6H  . melatonin  2.5 mg Oral QHS  . methylPREDNISolone (SOLU-MEDROL) injection  60 mg Intravenous Q12H  . multivitamin with minerals  1 tablet Oral Daily  . zinc sulfate  220 mg Oral Daily  Continuous Infusions: . sodium chloride Stopped (06/17/20 1040)  . azithromycin    . cefTRIAXone (ROCEPHIN)  IV 1 g (06/17/20 1040)     LOS: 5 days    Para Skeans, MD [facility] Pager [phone] If 7PM-7AM, please contact night-coverage [email or web address] Password [email or web address] 06/17/2020, 11:21 AM

## 2020-06-17 NOTE — Plan of Care (Signed)
  Problem: Clinical Measurements: Goal: Will remain free from infection Outcome: Progressing Goal: Respiratory complications will improve Outcome: Progressing   Problem: Pain Managment: Goal: General experience of comfort will improve Outcome: Progressing   Problem: Safety: Goal: Ability to remain free from injury will improve Outcome: Progressing   

## 2020-06-17 NOTE — Progress Notes (Signed)
Patient pulled mitt of and ripped out IV.  Right and swelling noted, but no bleeding .   Placed mitt back on, will continue to monitor

## 2020-06-17 NOTE — Plan of Care (Signed)
  Problem: Health Behavior/Discharge Planning: Goal: Ability to manage health-related needs will improve Outcome: Not Progressing   

## 2020-06-17 NOTE — Progress Notes (Signed)
Tried to encourage patient to eat.  Patient is not eating

## 2020-06-18 LAB — BLOOD GAS, ARTERIAL
Acid-Base Excess: 3.6 mmol/L — ABNORMAL HIGH (ref 0.0–2.0)
Bicarbonate: 26.7 mmol/L (ref 20.0–28.0)
Expiratory PAP: 6
FIO2: 0.65
Inspiratory PAP: 14
Mode: POSITIVE
O2 Saturation: 93.6 %
Patient temperature: 37
RATE: 12 resp/min
pCO2 arterial: 35 mmHg (ref 32.0–48.0)
pH, Arterial: 7.49 — ABNORMAL HIGH (ref 7.350–7.450)
pO2, Arterial: 63 mmHg — ABNORMAL LOW (ref 83.0–108.0)

## 2020-06-18 LAB — GLUCOSE, CAPILLARY
Glucose-Capillary: 169 mg/dL — ABNORMAL HIGH (ref 70–99)
Glucose-Capillary: 153 mg/dL — ABNORMAL HIGH (ref 70–99)
Glucose-Capillary: 122 mg/dL — ABNORMAL HIGH (ref 70–99)
Glucose-Capillary: 121 mg/dL — ABNORMAL HIGH (ref 70–99)
Glucose-Capillary: 115 mg/dL — ABNORMAL HIGH (ref 70–99)
Glucose-Capillary: 133 mg/dL — ABNORMAL HIGH (ref 70–99)

## 2020-06-18 LAB — RNA QUALITATIVE: HIV 1 RNA Qualitative: 1

## 2020-06-18 LAB — HIV-1/2 AB - DIFFERENTIATION
HIV 1 Ab: NEGATIVE
HIV 2 Ab: NEGATIVE
Note: NEGATIVE

## 2020-06-18 MED ORDER — HYDROCHLOROTHIAZIDE 12.5 MG PO CAPS
12.5000 mg | ORAL_CAPSULE | Freq: Two times a day (BID) | ORAL | Status: DC
  Administered 2020-06-18 – 2020-06-27 (×16): 12.5 mg via ORAL
  Filled 2020-06-18 (×18): qty 1

## 2020-06-18 MED ORDER — SODIUM CHLORIDE 0.9 % IV SOLN
2.0000 g | INTRAVENOUS | Status: AC
  Administered 2020-06-18 – 2020-06-21 (×3): 2 g via INTRAVENOUS
  Filled 2020-06-18 (×2): qty 2
  Filled 2020-06-18: qty 20
  Filled 2020-06-18: qty 2

## 2020-06-18 MED ORDER — HYDRALAZINE HCL 20 MG/ML IJ SOLN
10.0000 mg | Freq: Four times a day (QID) | INTRAMUSCULAR | Status: DC | PRN
  Administered 2020-06-18: 10 mg via INTRAVENOUS
  Filled 2020-06-18 (×3): qty 1

## 2020-06-18 NOTE — Progress Notes (Signed)
Touched base with dr. Allena Katz. Patient mentation has not improved since on bipap. Sat 93-95 on bipap at 65%. Dr. Jayme Cloud was added to help assess and decide next step for patient.

## 2020-06-18 NOTE — Progress Notes (Signed)
Patient less drowsy. Opens eyes more to voice. However still has poor attention/concentration. Sat 98-100 on bipap at 65%. RT heather on the floor. Spoke with her regarding patient and questioned taking off bipap and back on HFNC. However per RT leave patient on bipap for now. Will continue to monitor

## 2020-06-18 NOTE — Progress Notes (Signed)
Per dr. 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 93% on bipap 65% is good per md. Recommended ordering ABG. Called RT to make aware blood gas order. Will continue to monitor closely

## 2020-06-18 NOTE — Progress Notes (Signed)
PROGRESS NOTE    Chelsea Glass  PJA:[id number] DOB: [birth year] DOA: 06/12/2020 PCP: [facility] [place]    Brief Narrative:  Brought to the hospital because of altered mental status. History was obtained from ED physician and chart review. Patient is unable to provide an adequate history. Reportedly, EMS was called because patient had been laying in bed for about 5 days without getting up. Apparently, she had been laying in herfeces and urine. Per report, EMS had been called to the house about 4 days prior to admission but patient had refused transport to the hospital at that time.Brought to the hospital because of altered mental status. History was obtained from ED physician and chart review. Patient is unable to provide an adequate history. Reportedly, EMS was called because patient had been laying in bed for about 5 days without getting up. Apparently, she had been laying in herfeces and urine. Per report, EMS had been called to the house about 4 days prior to admission but patient had refused transport to the hospital at that time.  Assessment & Plan:   Active Problems:   Pneumonia due to COVID-19 virus   Acute respiratory failure with hypoxia (HCC)   Obesity, Class III, BMI 40-49.9 (morbid obesity) (HCC)   Severe sepsis ([place])   New onset atrial fibrillation (HCC)   Hypertension Severe sepsis- resolved.  -On admission patient met criteria for severe sepsis HR> 90,RR> 20, respiratory distress, lactic acid> 2 -Lactic acidosis has resolved post fluid resuscitation -Normal saline 61m/hr -Lactic acidosis resolved  Acute respiratory failure with hypoxia/Covid pneumonia COVID-19 Labs  Recent Labs    06/16/20 0658 06/17/20 0502 06/17/20 0708 06/17/20 1014  FERRITIN 193 138  --  170  LDH  --   --   --  321*  CRP 1.3*  --  0.9 1.1*    Lab Results  Component Value Date   SARSCOV2NAA POSITIVE (A) 06/12/2020   SW[place]doNEGATIVE 07/31/2019     Lab Results   Component Value Date   SARSCOV2NAA POSITIVE (A) 06/12/2020   SARSCOV2NAA NEGATIVE 07/31/2019    -Solu-Medrol 60 mg BID -Remdesivir per pharmacy protocol. --Anticoagulation with Lovenox 62.5 mg subcu every 24. -Vitamin C and zinc per Covid protocol -Incentive spirometry -Flutter valve  -Respimat QID 9/8: pt is sating in high 80's no distress / alert and awake but slow to answer seem to perseverate with answers.  9/9 SpO2: 97 % O2 Flow Rate (L/min): 65 L/min Dw/ daughter about moms intermittent hypoxia and D/W RT and nurse about plan for pt to keep her oxygen on but HFNC, d/w RT and Dr.Kasa about plan for possible bipap and guarded condition in case she needs to be transferred to ICU.   -9/10: SpO2: 97 % O2 Flow Rate (L/min): 65 L/min Pt seen today she is in no distress and oxygenating better with 15 L HFNC. spoke to daughter about starting baricitnab and she declined. Remdesivir  Therapy completed yesterday.  Pt also started on CAP coverage due to progressive infiltrates on chest xray.  covid labs pending. Currently pt stable on HFNC.  -9/11 SpO2: 97 % O2 Flow Rate (L/min): 65 L/min Pt is stable on bipap with o2 sats in high 90's.  Continue steroid therapy. D/W intensivist about pt's mental status and  Attribute to covid-19 encephalopathy. We will continue BIpap and monitor o2 saturation, with repeat ABG.   Diabetes type 2 uncontrolled with hyperglycemia -Lantus 20 units BID -NovoLog 8 units qac -Resistant SSI -9/6 Hemoglobin A1c= 8.2 -Consult  diabetic coordinator; obese uncontrolled diabetic requires teaching -Consult diabetic nutritionist obese uncontrolled diabetic requires teaching -- NO change cont ssi/ and accuchecks.   HLD -9/6 LDL= 234 -As soon as patient completes treatment for Covid would start patient on Lipitor 40 mg daily.  Essential HTN -Coreg 12.5 mg BID -Cardizem drip (discontinued), patient now in NSR --Patient continued on Coreg as mentioned  above, home regimen of chlorthalidone and Aldactone have been discontinued. --will restart diuretic therapy with hctz at 12.5 mg as well and uptitrate.  --increased hctz to bid.   New onset A. fib -See HTN --continue coreg pt in sinus rhythm now.   Hypokalemia -Potassium goal> 4 --will recheck and follow.   Hypomagnesmia -Magnesium goal> 2 - will recheck and follow.   Hypophosphatemia -Phosphorus goal> 2.5 We will follow levels.   Elevated liver enzymes -We will need to monitor closely but appear to be only slightly elevated and stable --current ast/alt of 43 and 81 and is improving.  Currently improving 37/ 74.  ~~~~~~~~~~~~~~~~~~~~~~~~~~~~~~~~~~~~~~~~~~~~~~~~~~~~~~~~~~~~~~~~~~~~  DVT prophylaxis: Lovenox.  Code Status: Full Family Communication: delores daughter spoke to her about moms guarded prognosis.  Disposition Plan: d/c to STR in one week .  Status is: Inpatient Remains inpatient appropriate because:Inpatient level of care appropriate due to severity of illness  Dispo: The patient is from: Home              Anticipated d/c is to: Home vs STR              Anticipated d/c date is: 5 days              Patient currently is not medically stable to d/c.   ~~~~~~~~~~~~~~~~~~~~~~~~~~~~~~~~~~~~~~~~~~~~~~~~~~~~~~~~~~~~~~~~~~~~  Subjective:  9/7 afebrile overnight A/O x4, positive S OB, negative CP, negative abdominal pain, negative nausea, negative vomiting, negative diarrhea.  9/8 Pt seen today she is alert and answers questions appropriately.  Pt's oxygen has been in high 80's and 90's pt advised for proning and her sats came up. We will also get PT on board.  9/9 Pt is alert and awake but appears confused intermittently and slow to answer.  D/W daughter about mom today and we will try to get her oxygen level up and if needed intubate. Spoke to nick RT and pt is not in distress and follows commands. Pt denies any complaints although  It is difficult to assess  if she has poor insight , she is confused from covid-19 infection.   9/10 Pt seen today she is again awake and alert but confused somewhat. She was pulling out her oxygen last night and has soft mitten placed. Pt is hypoxic on ABG today d/w nurse about HFNC and d/w intensivist and mid 22 sat are goal. Labs show improving lft. Repeat chest xray shows increasing bl infiltrates and spoke to daughter about baricitnab and risk and benefit and she declined  T/t with it. BP is high we will have a manual repeat and prn hydralazine in case it is still high.hbis stable and kidney function is improving.   COVID-19 Labs  Recent Labs    06/16/20 0658 06/17/20 0502 06/17/20 0708 06/17/20 1014  FERRITIN 193 138  --  170  LDH  --   --   --  321*  CRP 1.3*  --  0.9 1.1*    Lab Results  Component Value Date   SARSCOV2NAA POSITIVE (A) 06/12/2020   [place] NEGATIVE 07/31/2019   9/11 Pt seen today and she is still confused ,but  Awake and follows commands but slow to respond. D/w daughter delrois who does not want mom to received baricitinab, I readdressed the baricitnab today , told daughter that there has  been positive response in many of my patients but she wants to not start it.  Currently all labs are pending.   Objective: Vitals:   06/18/20 1000 06/18/20 1137 06/18/20 1200 06/18/20 1300  BP: (!) 152/137 (!) 152/91 (!) 142/92   Pulse: 81 70 70 78  Resp: (!) 24 (!) 21 (!) 27 19  Temp:   (!) 97.5 F (36.4 C)   TempSrc:   Axillary   SpO2: 97% 96% 92% 97%  Weight:      Height:        Intake/Output Summary (Last 24 hours) at 06/18/2020 1340 Last data filed at 06/18/2020 0455 Gross per 24 hour  Intake 626.37 ml  Output 780 ml  Net -153.63 ml   Filed Weights   06/15/20 0427 06/15/20 0539 06/16/20 0253  Weight: 126.9 kg 131.3 kg 126.6 kg   Examination: Blood pressure (!) 142/92, pulse 78, temperature (!) 97.5 F (36.4 C), temperature source Axillary, resp. rate 19, height 5' 6"   (1.676 m), weight 126.6 kg, SpO2 97 %.  General exam: Appears calm and comfortable  Respiratory system: BL Rhonchi in both lungs posteriorly.  Cardiovascular system: S1 & S2 heard, RRR. No JVD, murmurs, rubs, gallops or clicks. No pedal edema. Gastrointestinal system: Abdomen is nondistended, soft and nontender. No organomegaly or masses felt. Normal bowel sounds heard. Central nervous system: Alert and oriented. No focal neurological deficits. Extremities:CN are grossly intact. Pt moving all ext.  Skin: No rashes, lesions or ulcers Psychiatry: Judgement and insight appear normal. Mood & affect appropriate.   Data Reviewed: I have personally reviewed following labs and imaging studies  CBC: Recent Labs  Lab 06/13/20 1044 06/14/20 0510 06/15/20 0404 06/16/20 0658 06/17/20 0502  WBC 4.5 7.6 10.8* 11.2* 14.2*  NEUTROABS 2.2 5.4 8.5* 9.1* 11.4*  HGB 14.9 14.4 14.4 14.3 15.5*  HCT 43.9 42.3 43.6 42.2 46.5*  MCV 81.4 82.3 82.0 81.2 81.7  PLT 182 187 212 204 756   Basic Metabolic Panel: Recent Labs  Lab 06/13/20 1044 06/13/20 1044 06/14/20 0510 06/15/20 0404 06/16/20 0658 06/17/20 0502 06/17/20 1014  NA 137  --  134* 138 139 141  --   K 3.8  --  4.0 3.7 4.4 4.6  --   CL 97*  --  99 104 104 107  --   CO2 27  --  25 23 25 23   --   GLUCOSE 396*  --  194* 122* 127* 99  --   BUN 28*  --  35* 41* 44* 40*  --   CREATININE 0.99  --  1.31* 1.14* 1.10* 1.10*  --   CALCIUM 8.8*  --  9.3 9.4 9.4 9.4  --   MG 1.9   < > 2.3 2.5* 2.5* 2.6* 2.6*  PHOS 3.1  --  4.1 4.2 4.5 3.2  --    < > = values in this interval not displayed.   GFR: Estimated Creatinine Clearance: 69.4 mL/min (A) (by C-G formula based on SCr of 1.1 mg/dL (H)). Liver Function Tests: Recent Labs  Lab 06/13/20 1044 06/14/20 0510 06/15/20 0404 06/16/20 0658 06/17/20 0502  AST 61* 59* 61* 43* 37  ALT 95* 84* 86* 81* 74*  ALKPHOS 493* 462* 462* 411* 399*  BILITOT 0.7 0.6 0.8 1.0 1.0  PROT 6.8 6.8 6.6 6.3*  6.4*    ALBUMIN 2.9* 2.8* 2.8* 2.6* 2.7*    No results for input(s): HGBA1C in the last 72 hours. CBG: Recent Labs  Lab 06/17/20 1951 06/17/20 2333 06/18/20 0347 06/18/20 0752 06/18/20 1134  GLUCAP 229* 207* 169* 153* 122*   Lipid Profile: No results for input(s): CHOL, HDL, LDLCALC, TRIG, CHOLHDL, LDLDIRECT in the last 72 hours.Anemia Panel: Recent Labs    06/17/20 0502 06/17/20 1014  FERRITIN 138 170   Sepsis Labs: Recent Labs  Lab 06/12/20 1552 06/13/20 1044 06/13/20 1321 06/14/20 0510 06/15/20 0404  PROCALCITON  --  0.35  --  0.28 0.12  LATICACIDVEN 2.8* 1.6 1.4  --   --     Recent Results (from the past 240 hour(s))  SARS Coronavirus 2 by RT PCR (hospital order, performed in [facility] hospital lab) Nasopharyngeal Nasopharyngeal Swab     Status: Abnormal   Collection Time: 06/12/20  3:21 PM   Specimen: Nasopharyngeal Swab  Result Value Ref Range Status   SARS Coronavirus 2 POSITIVE (A) NEGATIVE Final    Comment: RESULT CALLED TO, READ BACK BY AND VERIFIED WITH: MEGAN JONES AT 1634 06/12/20.PMF (NOTE) SARS-CoV-2 target nucleic acids are DETECTED  SARS-CoV-2 RNA is generally detectable in upper respiratory specimens  during the acute phase of infection.  Positive results are indicative  of the presence of the identified virus, but do not rule out bacterial infection or co-infection with other pathogens not detected by the test.  Clinical correlation with patient history and  other diagnostic information is necessary to determine patient infection status.  The expected result is negative.  Fact Sheet for Patients:   StrictlyIdeas.no   Fact Sheet for Healthcare Providers:   BankingDealers.co.za    This test is not yet approved or cleared by the [place] FDA and  has been authorized for detection and/or diagnosis of SARS-CoV-2 by FDA under an Emergency Use Authorization (EUA).  This EUA will remain in effect  (meaning this test c an be used) for the duration of  the COVID-19 declaration under Section 564(b)(1) of the Act, 21 U.S.C. section 360-bbb-3(b)(1), unless the authorization is terminated or revoked sooner.  Performed at [facility], [address] [place], [place] [address]   Blood culture (routine x 2)     Status: None   Collection Time: 06/12/20  3:21 PM   Specimen: BLOOD  Result Value Ref Range Status   Specimen Description BLOOD RIGHT FOREARM  Final   Special Requests   Final    BOTTLES DRAWN AEROBIC AND ANAEROBIC Blood Culture adequate volume   Culture   Final    NO GROWTH 5 DAYS Performed at [facility], [address] [place], [place] [address]    Report Status 06/17/2020 FINAL  Final  Blood culture (routine x 2)     Status: None   Collection Time: 06/12/20  3:21 PM   Specimen: BLOOD  Result Value Ref Range Status   Specimen Description BLOOD LEFT HAND  Final   Special Requests   Final    BOTTLES DRAWN AEROBIC AND ANAEROBIC Blood Culture adequate volume   Culture   Final    NO GROWTH 5 DAYS Performed at [facility], [address], [place], [place] [address]    Report Status 06/17/2020 FINAL  Final     Scheduled Meds:  vitamin C  500 mg Oral Daily   carvedilol  12.5 mg Oral BID WC   enoxaparin (LOVENOX) injection  0.5 mg/kg Subcutaneous  Q24H   feeding supplement (NEPRO CARB STEADY)  237 mL Oral TID BM   hydrochlorothiazide  12.5 mg Oral BID   insulin aspart  0-20 Units Subcutaneous Q4H   insulin aspart  8 Units Subcutaneous TID WC   insulin glargine  20 Units Subcutaneous BID   Ipratropium-Albuterol  1 puff Inhalation Q6H   melatonin  2.5 mg Oral QHS   methylPREDNISolone (SOLU-MEDROL) injection  60 mg Intravenous Q12H   multivitamin with minerals  1 tablet Oral Daily   zinc sulfate  220 mg Oral Daily   Continuous Infusions:  sodium chloride 40 mL/hr at 06/18/20 1236   azithromycin 500 mg (06/18/20 0851)    cefTRIAXone (ROCEPHIN)  IV 2 g (06/18/20 1036)     LOS: 6 days    Para Skeans, MD [facility] Pager [phone] If 7PM-7AM, please contact night-coverage [email or web address] Password [email or web address] 06/18/2020, 1:40 PM

## 2020-06-18 NOTE — Progress Notes (Signed)
Patient arouses to voice however poor attention. Patient will not answer questions to determine orientation. Patient refused to eat and only takes sips of fluids. Dr Allena Katz at bedside. Patient currently on HFNC AT 15. Sat low 90's, unable to take deep breaths on demand. Per dr. Allena Katz start patient on continuous bipap. Will continue to monitor closely.

## 2020-06-19 ENCOUNTER — Inpatient Hospital Stay: Payer: Self-pay

## 2020-06-19 LAB — COMPREHENSIVE METABOLIC PANEL
ALT: 92 U/L — ABNORMAL HIGH (ref 0–44)
AST: 40 U/L (ref 15–41)
Albumin: 2.8 g/dL — ABNORMAL LOW (ref 3.5–5.0)
Alkaline Phosphatase: 412 U/L — ABNORMAL HIGH (ref 38–126)
Anion gap: 9 (ref 5–15)
BUN: 33 mg/dL — ABNORMAL HIGH (ref 8–23)
CO2: 25 mmol/L (ref 22–32)
Calcium: 10 mg/dL (ref 8.9–10.3)
Chloride: 107 mmol/L (ref 98–111)
Creatinine, Ser: 0.85 mg/dL (ref 0.44–1.00)
GFR calc Af Amer: >60 mL/min (ref >60)
GFR calc non Af Amer: >60 mL/min (ref >60)
Glucose, Bld: 168 mg/dL — ABNORMAL HIGH (ref 70–99)
Potassium: 4.2 mmol/L (ref 3.5–5.1)
Sodium: 141 mmol/L (ref 135–145)
Total Bilirubin: 0.8 mg/dL (ref 0.3–1.2)
Total Protein: 6.6 g/dL (ref 6.5–8.1)

## 2020-06-19 LAB — CBC WITH DIFFERENTIAL/PLATELET
Abs Immature Granulocytes: 0.69 10*3/uL — ABNORMAL HIGH (ref 0.00–0.07)
Basophils Absolute: 0 10*3/uL (ref 0.0–0.1)
Basophils Relative: 0 %
Eosinophils Absolute: 0 10*3/uL (ref 0.0–0.5)
Eosinophils Relative: 0 %
HCT: 45.8 % (ref 36.0–46.0)
Hemoglobin: 15.2 g/dL — ABNORMAL HIGH (ref 12.0–15.0)
Immature Granulocytes: 4 %
Lymphocytes Relative: 8 %
Lymphs Abs: 1.4 10*3/uL (ref 0.7–4.0)
MCH: 27.2 pg (ref 26.0–34.0)
MCHC: 33.2 g/dL (ref 30.0–36.0)
MCV: 82.1 fL (ref 80.0–100.0)
Monocytes Absolute: 1.5 10*3/uL — ABNORMAL HIGH (ref 0.1–1.0)
Monocytes Relative: 9 %
Neutro Abs: 12.9 10*3/uL — ABNORMAL HIGH (ref 1.7–7.7)
Neutrophils Relative %: 79 %
Platelets: 349 10*3/uL (ref 150–400)
RBC: 5.58 MIL/uL — ABNORMAL HIGH (ref 3.87–5.11)
RDW: 14.2 % (ref 11.5–15.5)
WBC: 16.5 10*3/uL — ABNORMAL HIGH (ref 4.0–10.5)
nRBC: 0 % (ref 0.0–0.2)

## 2020-06-19 LAB — PHOSPHORUS: Phosphorus: 2.7 mg/dL (ref 2.5–4.6)

## 2020-06-19 LAB — GLUCOSE, CAPILLARY
Glucose-Capillary: 140 mg/dL — ABNORMAL HIGH (ref 70–99)
Glucose-Capillary: 131 mg/dL — ABNORMAL HIGH (ref 70–99)
Glucose-Capillary: 126 mg/dL — ABNORMAL HIGH (ref 70–99)
Glucose-Capillary: 141 mg/dL — ABNORMAL HIGH (ref 70–99)
Glucose-Capillary: 156 mg/dL — ABNORMAL HIGH (ref 70–99)

## 2020-06-19 LAB — MAGNESIUM: Magnesium: 2.3 mg/dL (ref 1.7–2.4)

## 2020-06-19 LAB — C-REACTIVE PROTEIN
CRP: 1.2 mg/dL — ABNORMAL HIGH (ref <1.0)
CRP: 4.5 mg/dL — ABNORMAL HIGH (ref <1.0)

## 2020-06-19 LAB — FIBRIN DERIVATIVES D-DIMER (ARMC ONLY): Fibrin derivatives D-dimer (ARMC): 283.71 ng/mL (FEU) (ref 0.00–499.00)

## 2020-06-19 LAB — FERRITIN: Ferritin: 186 ng/mL (ref 11–307)

## 2020-06-19 LAB — LACTATE DEHYDROGENASE: LDH: 348 U/L — ABNORMAL HIGH (ref 98–192)

## 2020-06-19 MED ORDER — SODIUM CHLORIDE 0.9% FLUSH: 10.0000 mL | INTRAVENOUS | Status: DC | PRN

## 2020-06-19 MED ORDER — CHLORHEXIDINE GLUCONATE CLOTH 2 % EX PADS
6.0000 | MEDICATED_PAD | Freq: Every day | CUTANEOUS | Status: DC
  Administered 2020-06-19 – 2020-06-26 (×6): 6 via TOPICAL

## 2020-06-19 MED ORDER — SODIUM CHLORIDE 0.9% FLUSH
10.0000 mL | Freq: Two times a day (BID) | INTRAVENOUS | Status: DC
  Administered 2020-06-19 – 2020-06-27 (×14): 10 mL

## 2020-06-19 NOTE — Progress Notes (Signed)
Iv team were unable to insert new Iv line, MD notified, patient to get a picc line as per order, waiting for picc team to place a  Line

## 2020-06-19 NOTE — Progress Notes (Signed)
A consult was placed to the IV Therapist for another iv restart;  Pt had an iv restarted during the night, however she has pulled it out;  Pt is a very difficult stick, even when assessed with ultrasound;  Only able to assess a Cephalic vein in the Left upper arm, however it is deep;  Pt is not able to fully extend her arms, and is a little confused, removing her mittens from her hands.  Have spoken with her RN regarding access issues; waiting to hear back at this time.  Barkley Bruns RN, IV Therapy

## 2020-06-19 NOTE — Plan of Care (Signed)
  Problem: Safety: Goal: Ability to remain free from injury will improve Outcome: Progressing   Problem: Respiratory: Goal: Will maintain a patent airway Outcome: Progressing   

## 2020-06-19 NOTE — Progress Notes (Signed)
PROGRESS NOTE    Chelsea Glass  ZOX:[id number] DOB: 05-28-1956 DOA: 06/12/2020 PCP: [facility] [place]    Brief Narrative:  Brought to the hospital because of altered mental status. History was obtained from ED physician and chart review. Patient is unable to provide an adequate history. Reportedly, EMS was called because patient had been laying in bed for about 5 days without getting up. Apparently, she had been laying in herfeces and urine. Per report, EMS had been called to the house about 4 days prior to admission but patient had refused transport to the hospital at that time.Brought to the hospital because of altered mental status. History was obtained from ED physician and chart review. Patient is unable to provide an adequate history. Reportedly, EMS was called because patient had been laying in bed for about 5 days without getting up. Apparently, she had been laying in herfeces and urine. Per report, EMS had been called to the house about 4 days prior to admission but patient had refused transport to the hospital at that time.  Assessment & Plan:   Active Problems:   Pneumonia due to COVID-19 virus   Acute respiratory failure with hypoxia (HCC)   Obesity, Class III, BMI 40-49.9 (morbid obesity) (HCC)   Severe sepsis ([place])   New onset atrial fibrillation (HCC)   Hypertension Severe sepsis- resolved.  -On admission patient met criteria for severe sepsis HR> 90,RR> 20, respiratory distress, lactic acid> 2 -Lactic acidosis has resolved post fluid resuscitation -Normal saline 70m/hr -Lactic acidosis resolved  Acute respiratory failure with hypoxia/Covid pneumonia COVID-19 Labs  Recent Labs    06/17/20 0502 06/17/20 0708 06/17/20 1014 06/18/20 1630 06/19/20 0412  FERRITIN 138  --  170  --  186  LDH  --   --  321*  --  348*  CRP  --    < > 1.1* 1.2* 4.5*   < > = values in this interval not displayed.    -Solu-Medrol 60 mg BID -Remdesivir per pharmacy  protocol. --Anticoagulation with Lovenox 62.5 mg subcu every 24. -Vitamin C and zinc per Covid protocol -Incentive spirometry -Flutter valve  -Respimat QID 9/8: pt is sating in high 80's no distress / alert and awake but slow to answer seem to perseverate with answers.  9/9 SpO2: 100 % O2 Flow Rate (L/min): 65 L/min FiO2 (%): 65 % Dw/ daughter about moms intermittent hypoxia and D/W RT and nurse about plan for pt to keep her oxygen on but HFNC, d/w RT and Dr.Kasa about plan for possible bipap and guarded condition in case she needs to be transferred to ICU.   -9/10: SpO2: 100 % O2 Flow Rate (L/min): 65 L/min FiO2 (%): 65 % Pt seen today she is in no distress and oxygenating better with 15 L HFNC. spoke to daughter about starting baricitnab and she declined. Remdesivir  Therapy completed yesterday.  Pt also started on CAP coverage due to progressive infiltrates on chest xray.  covid labs pending. Currently pt stable on HFNC.  -9/11 SpO2: 100 % O2 Flow Rate (L/min): 65 L/min FiO2 (%): 65 % Pt is stable on bipap with o2 sats in high 90's.  Continue steroid therapy. D/W intensivist about pt's mental status and  Attribute to covid-19 encephalopathy. We will continue BIpap and monitor o2 saturation, with repeat ABG.   9/12 Vitals stable afebrile overnight, pt has responded well clinically with bipap we will make her start using intermittent bipap with HFNC an hour before and an hour after  small meals And resuming bipap therapy. CRP going up and ferritin not resulted.    Diabetes type 2 uncontrolled with hyperglycemia -Lantus 20 units BID -NovoLog 8 units qac -Resistant SSI -9/6 Hemoglobin A1c= 8.2 -Consult diabetic coordinator; obese uncontrolled diabetic requires teaching -Consult diabetic nutritionist obese uncontrolled diabetic requires teaching -- NO change cont ssi/ and accuchecks. --will hold lantus and premeal insulin as pt is on bipap continuously.   HLD -9/6 LDL=  234 -As soon as patient completes treatment for Covid would start patient on Lipitor 40 mg daily.  Essential HTN -Coreg 12.5 mg BID -Cardizem drip (discontinued), patient now in NSR --Patient continued on Coreg as mentioned above, home regimen of chlorthalidone and Aldactone have been discontinued. --will restart diuretic therapy with hctz at 12.5 mg as well and uptitrate.  --increased hctz to bid.  -cont with prn hydralazine.  New onset A. fib -See HTN --continue coreg pt in sinus rhythm now.   Hypokalemia -Potassium goal> 4 --will recheck and follow.   Hypomagnesmia -Magnesium goal> 2 - will recheck and follow.   Hypophosphatemia -Phosphorus goal> 2.5 We will follow levels.   Elevated liver enzymes -We will need to monitor closely but appear to be only slightly elevated and stable --current ast/alt of 43 and 81 and is improving.  Currently improving 37/ 74. --AST is WNL and alt is higher than before.   ~~~~~~~~~~~~~~~~~~~~~~~~~~~~~~~~~~~~~~~~~~~~~~~~~~~~~~~~~~~~~~~~~~~~  DVT prophylaxis: Lovenox.  Code Status: Full Family Communication: delores daughter spoke to her about moms guarded prognosis.  Disposition Plan: d/c to STR in one week .  Status is: Inpatient Remains inpatient appropriate because:Inpatient level of care appropriate due to severity of illness  Dispo: The patient is from: Home              Anticipated d/c is to: Home vs STR              Anticipated d/c date is: 5 days              Patient currently is not medically stable to d/c.   ~~~~~~~~~~~~~~~~~~~~~~~~~~~~~~~~~~~~~~~~~~~~~~~~~~~~~~~~~~~~~~~~~~~~  Subjective:  9/7 afebrile overnight A/O x4, positive S OB, negative CP, negative abdominal pain, negative nausea, negative vomiting, negative diarrhea.  9/8 Pt seen today she is alert and answers questions appropriately.  Pt's oxygen has been in high 80's and 90's pt advised for proning and her sats came up. We will also get PT on  board.  9/9 Pt is alert and awake but appears confused intermittently and slow to answer.  D/W daughter about mom today and we will try to get her oxygen level up and if needed intubate. Spoke to nick RT and pt is not in distress and follows commands. Pt denies any complaints although  It is difficult to assess if she has poor insight , she is confused from covid-19 infection.   9/10 Pt seen today she is again awake and alert but confused somewhat. She was pulling out her oxygen last night and has soft mitten placed. Pt is hypoxic on ABG today d/w nurse about HFNC and d/w intensivist and mid 83 sat are goal. Labs show improving lft. Repeat chest xray shows increasing bl infiltrates and spoke to daughter about baricitnab and risk and benefit and she declined  T/t with it. BP is high we will have a manual repeat and prn hydralazine in case it is still high.hbis stable and kidney function is improving.   COVID-19 Labs  Recent Labs    06/17/20 0502 06/17/20 0708 06/17/20  1014 06/18/20 1630 06/19/20 0412  FERRITIN 138  --  170  --  186  LDH  --   --  321*  --  348*  CRP  --    < > 1.1* 1.2* 4.5*   < > = values in this interval not displayed.    Lab Results  Component Value Date   [place] (A) 06/12/2020   [place] NEGATIVE 07/31/2019   9/11 Pt seen today and she is still confused ,but  Awake and follows commands but slow to respond. D/w daughter delrois who does not want mom to received baricitinab, I readdressed the baricitnab today , told daughter that there has  been positive response in many of my patients but she wants to not start it.  Currently all labs are pending.   9/12 Pt on cont bipap for past 24 hours and is more alert per report. COVID-19 Labs  Recent Labs    06/17/20 0502 06/17/20 0708 06/17/20 1014 06/18/20 1630 06/19/20 0412  FERRITIN 138  --  170  --  186  LDH  --   --  321*  --  348*  CRP  --    < > 1.1* 1.2* 4.5*   < > = values in this  interval not displayed.    Lab Results  Component Value Date   SARSCOV2NAA POSITIVE (A) 06/12/2020   [place] NEGATIVE 07/31/2019     Objective: Vitals:   06/19/20 1300 06/19/20 1326 06/19/20 1400 06/19/20 1413  BP:    (!) 159/91  Pulse: 91  90   Resp: 18  (!) 22   Temp:  98.9 F (37.2 C)    TempSrc:  Oral    SpO2: 100%  100%   Weight:      Height:        Intake/Output Summary (Last 24 hours) at 06/19/2020 1614 Last data filed at 06/19/2020 1500 Gross per 24 hour  Intake 364.71 ml  Output 1400 ml  Net -1035.29 ml   Filed Weights   06/15/20 0539 06/16/20 0253 06/19/20 0440  Weight: 131.3 kg 126.6 kg 126.6 kg   Examination: Blood pressure (!) 159/91, pulse 90, temperature 98.9 F (37.2 C), temperature source Oral, resp. rate (!) 22, height 5' 6" (1.676 m), weight 126.6 kg, SpO2 100 %.  General exam: Appears calm and comfortable  Respiratory system: BL Rhonchi in both lungs posteriorly.  Cardiovascular system: S1 & S2 heard, RRR. No JVD, murmurs, rubs, gallops or clicks. No pedal edema. Gastrointestinal system: Abdomen is nondistended, soft and nontender. No organomegaly or masses felt. Normal bowel sounds heard. Central nervous system: Alert and oriented. No focal neurological deficits. Extremities:CN are grossly intact. Pt moving all ext.  Skin: No rashes, lesions or ulcers Psychiatry: Judgement and insight appear normal. Mood & affect appropriate.   Data Reviewed: I have personally reviewed following labs and imaging studies  CBC: Recent Labs  Lab 06/14/20 0510 06/15/20 0404 06/16/20 0658 06/17/20 0502 06/19/20 0412  WBC 7.6 10.8* 11.2* 14.2* 16.5*  NEUTROABS 5.4 8.5* 9.1* 11.4* 12.9*  HGB 14.4 14.4 14.3 15.5* 15.2*  HCT 42.3 43.6 42.2 46.5* 45.8  MCV 82.3 82.0 81.2 81.7 82.1  PLT 187 212 204 257 696   Basic Metabolic Panel: Recent Labs  Lab 06/14/20 0510 06/14/20 0510 06/15/20 0404 06/16/20 0658 06/17/20 0502 06/17/20 1014 06/19/20 0412  NA  134*  --  138 139 141  --  141  K 4.0  --  3.7 4.4 4.6  --  4.2  CL 99  --  104 104 107  --  107  CO2 25  --  _0 --  25  GLUCOSE 194*  --  122* 127* 99  --  168*  BUN 35*  --  41* 44* 40*  --  33*  CREATININE 1.31*  --  1.14* 1.10* 1.10*  --  0.85  CALCIUM 9.3  --  9.4 9.4 9.4  --  10.0  MG 2.3   < > 2.5* 2.5* 2.6* 2.6* 2.3  PHOS 4.1  --  4.2 4.5 3.2  --  2.7   < > = values in this interval not displayed.   GFR: Estimated Creatinine Clearance: 89.8 mL/min (by C-G formula based on SCr of 0.85 mg/dL). Liver Function Tests: Recent Labs  Lab 06/14/20 0510 06/15/20 0404 06/16/20 0658 06/17/20 0502 06/19/20 0412  AST 59* 61* 43* 37 40  ALT 84* 86* 81* 74* 92*  ALKPHOS 462* 462* 411* 399* 412*  BILITOT 0.6 0.8 1.0 1.0 0.8  PROT 6.8 6.6 6.3* 6.4* 6.6  ALBUMIN 2.8* 2.8* 2.6* 2.7* 2.8*    No results for input(s): HGBA1C in the last 72 hours. CBG: Recent Labs  Lab 06/18/20 2014 06/18/20 2259 06/19/20 0416 06/19/20 0818 06/19/20 1251  GLUCAP 115* 133* 140* 131* 126*   Anemia Panel: Recent Labs    06/17/20 1014 06/19/20 0412  FERRITIN 170 186   Sepsis Labs: Recent Labs  Lab 06/13/20 1044 06/13/20 1321 06/14/20 0510 06/15/20 0404  PROCALCITON 0.35  --  0.28 0.12  LATICACIDVEN 1.6 1.4  --   --     Recent Results (from the past 240 hour(s))  SARS Coronavirus 2 by RT PCR (hospital order, performed in [place] hospital lab) Nasopharyngeal Nasopharyngeal Swab     Status: Abnormal   Collection Time: 06/12/20  3:21 PM   Specimen: Nasopharyngeal Swab  Result Value Ref Range Status   SARS Coronavirus 2 POSITIVE (A) NEGATIVE Final    Comment: RESULT CALLED TO, READ BACK BY AND VERIFIED WITH: MEGAN JONES AT 1634 06/12/20.PMF (NOTE) SARS-CoV-2 target nucleic acids are DETECTED  SARS-CoV-2 RNA is generally detectable in upper respiratory specimens  during the acute phase of infection.  Positive results are indicative  of the presence of the identified virus, but  do not rule out bacterial infection or co-infection with other pathogens not detected by the test.  Clinical correlation with patient history and  other diagnostic information is necessary to determine patient infection status.  The expected result is negative.  Fact Sheet for Patients:   StrictlyIdeas.no   Fact Sheet for Healthcare Providers:   BankingDealers.co.za    This test is not yet approved or cleared by the [place] FDA and  has been authorized for detection and/or diagnosis of SARS-CoV-2 by FDA under an Emergency Use Authorization (EUA).  This EUA will remain in effect (meaning this test c an be used) for the duration of  the COVID-19 declaration under Section 564(b)(1) of the Act, 21 U.S.C. section 360-bbb-3(b)(1), unless the authorization is terminated or revoked sooner.  Performed at [facility], [address] [place], [place] [address]   Blood culture (routine x 2)     Status: None   Collection Time: 06/12/20  3:21 PM   Specimen: BLOOD  Result Value Ref Range Status   Specimen Description BLOOD RIGHT FOREARM  Final   Special Requests   Final    BOTTLES DRAWN AEROBIC AND ANAEROBIC Blood Culture adequate  volume   Culture   Final    NO GROWTH 5 DAYS Performed at [facility], [address] [place], [place] [address]    Report Status 06/17/2020 FINAL  Final  Blood culture (routine x 2)     Status: None   Collection Time: 06/12/20  3:21 PM   Specimen: BLOOD  Result Value Ref Range Status   Specimen Description BLOOD LEFT HAND  Final   Special Requests   Final    BOTTLES DRAWN AEROBIC AND ANAEROBIC Blood Culture adequate volume   Culture   Final    NO GROWTH 5 DAYS Performed at [facility], [address], [place],  [address]    Report Status 06/17/2020 FINAL  Final     Scheduled Meds: . vitamin C  500 mg Oral Daily  . carvedilol  12.5 mg Oral BID WC  . enoxaparin  (LOVENOX) injection  0.5 mg/kg Subcutaneous Q24H  . feeding supplement (NEPRO CARB STEADY)  237 mL Oral TID BM  . hydrochlorothiazide  12.5 mg Oral BID  . insulin aspart  0-20 Units Subcutaneous Q4H  . Ipratropium-Albuterol  1 puff Inhalation Q6H  . melatonin  2.5 mg Oral QHS  . methylPREDNISolone (SOLU-MEDROL) injection  60 mg Intravenous Q12H  . multivitamin with minerals  1 tablet Oral Daily  . zinc sulfate  220 mg Oral Daily   Continuous Infusions: . sodium chloride 75 mL/hr at 06/19/20 0911  . azithromycin 500 mg (06/19/20 0912)  . cefTRIAXone (ROCEPHIN)  IV Stopped (06/18/20 1107)     LOS: 7 days    Para Skeans, MD [facility] Pager [phone] If 7PM-7AM, please contact night-coverage [email or web address] Password [facility] 06/19/2020, 4:14 PM

## 2020-06-19 NOTE — Progress Notes (Signed)
Peripherally Inserted Central Catheter Placement  The IV Nurse has discussed with the patient and/or persons authorized to consent for the patient, the purpose of this procedure and the potential benefits and risks involved with this procedure.  The benefits include less needle sticks, lab draws from the catheter, and the patient may be discharged home with the catheter. Risks include, but not limited to, infection, bleeding, blood clot (thrombus formation), and puncture of an artery; nerve damage and irregular heartbeat and possibility to perform a PICC exchange if needed/ordered by physician.  Alternatives to this procedure were also discussed.  Bard Power PICC patient education guide, fact sheet on infection prevention and patient information card has been provided to patient /or left at bedside.  RN obtained telephone consent from family d/t pt confused and agitated.  PICC Placement Documentation  PICC Double Lumen 06/19/20 PICC Right Brachial 42 cm 1 cm (Active)  Indication for Insertion or Continuance of Line Poor Vasculature-patient has had multiple peripheral attempts or PIVs lasting less than 24 hours;Limited venous access - need for IV therapy >5 days (PICC only);Prolonged intravenous therapies 06/19/20 1940  Exposed Catheter (cm) 1 cm 06/19/20 1940  Site Assessment Clean;Dry;Intact 06/19/20 1940  Lumen #1 Status Flushed;Saline locked;Blood return noted 06/19/20 1940  Lumen #2 Status Flushed;Saline locked;Blood return noted 06/19/20 1940  Dressing Type Transparent 06/19/20 1940  Dressing Status Clean;Dry;Intact;Antimicrobial disc in place 06/19/20 1940  Safety Lock Not Applicable 06/19/20 1940  Line Care Connections checked and tightened 06/19/20 1940  Line Adjustment (NICU/IV Team Only) No 06/19/20 1940  Dressing Intervention New dressing 06/19/20 1940  Dressing Change Due 06/26/20 06/19/20 1940       Chelsea Glass 06/19/2020, 7:41 PM

## 2020-06-19 NOTE — Progress Notes (Signed)
Attempted to retart IV after patient pulled out her Iv, IV team consulted

## 2020-06-20 LAB — CBC WITH DIFFERENTIAL/PLATELET
Abs Immature Granulocytes: 0.72 10*3/uL — ABNORMAL HIGH (ref 0.00–0.07)
Basophils Absolute: 0 10*3/uL (ref 0.0–0.1)
Basophils Relative: 0 %
Eosinophils Absolute: 0 10*3/uL (ref 0.0–0.5)
Eosinophils Relative: 0 %
HCT: 43.9 % (ref 36.0–46.0)
Hemoglobin: 15.2 g/dL — ABNORMAL HIGH (ref 12.0–15.0)
Immature Granulocytes: 6 %
Lymphocytes Relative: 10 %
Lymphs Abs: 1.3 10*3/uL (ref 0.7–4.0)
MCH: 27.7 pg (ref 26.0–34.0)
MCHC: 34.6 g/dL (ref 30.0–36.0)
MCV: 80 fL (ref 80.0–100.0)
Monocytes Absolute: 1.1 10*3/uL — ABNORMAL HIGH (ref 0.1–1.0)
Monocytes Relative: 9 %
Neutro Abs: 9.8 10*3/uL — ABNORMAL HIGH (ref 1.7–7.7)
Neutrophils Relative %: 75 %
Platelets: 347 10*3/uL (ref 150–400)
RBC: 5.49 MIL/uL — ABNORMAL HIGH (ref 3.87–5.11)
RDW: 14.4 % (ref 11.5–15.5)
Smear Review: NORMAL
WBC: 12.9 10*3/uL — ABNORMAL HIGH (ref 4.0–10.5)
nRBC: 0 % (ref 0.0–0.2)

## 2020-06-20 LAB — GLUCOSE, CAPILLARY
Glucose-Capillary: 136 mg/dL — ABNORMAL HIGH (ref 70–99)
Glucose-Capillary: 167 mg/dL — ABNORMAL HIGH (ref 70–99)
Glucose-Capillary: 175 mg/dL — ABNORMAL HIGH (ref 70–99)
Glucose-Capillary: 202 mg/dL — ABNORMAL HIGH (ref 70–99)
Glucose-Capillary: 226 mg/dL — ABNORMAL HIGH (ref 70–99)
Glucose-Capillary: 281 mg/dL — ABNORMAL HIGH (ref 70–99)
Glucose-Capillary: 296 mg/dL — ABNORMAL HIGH (ref 70–99)

## 2020-06-20 LAB — COMPREHENSIVE METABOLIC PANEL
ALT: 74 U/L — ABNORMAL HIGH (ref 0–44)
AST: 29 U/L (ref 15–41)
Albumin: 2.7 g/dL — ABNORMAL LOW (ref 3.5–5.0)
Alkaline Phosphatase: 343 U/L — ABNORMAL HIGH (ref 38–126)
Anion gap: 11 (ref 5–15)
BUN: 34 mg/dL — ABNORMAL HIGH (ref 8–23)
CO2: 23 mmol/L (ref 22–32)
Calcium: 9.9 mg/dL (ref 8.9–10.3)
Chloride: 105 mmol/L (ref 98–111)
Creatinine, Ser: 0.95 mg/dL (ref 0.44–1.00)
GFR calc Af Amer: >60 mL/min (ref >60)
GFR calc non Af Amer: >60 mL/min (ref >60)
Glucose, Bld: 185 mg/dL — ABNORMAL HIGH (ref 70–99)
Potassium: 4.3 mmol/L (ref 3.5–5.1)
Sodium: 139 mmol/L (ref 135–145)
Total Bilirubin: 0.9 mg/dL (ref 0.3–1.2)
Total Protein: 6.9 g/dL (ref 6.5–8.1)

## 2020-06-20 LAB — FIBRIN DERIVATIVES D-DIMER (ARMC ONLY): Fibrin derivatives D-dimer (ARMC): 391.54 ng/mL (FEU) (ref 0.00–499.00)

## 2020-06-20 LAB — LACTATE DEHYDROGENASE: LDH: 323 U/L — ABNORMAL HIGH (ref 98–192)

## 2020-06-20 LAB — C-REACTIVE PROTEIN: CRP: 5.8 mg/dL — ABNORMAL HIGH (ref <1.0)

## 2020-06-20 LAB — FERRITIN: Ferritin: 227 ng/mL (ref 11–307)

## 2020-06-20 MED ORDER — LABETALOL HCL 5 MG/ML IV SOLN
10.0000 mg | Freq: Once | INTRAVENOUS | Status: AC
  Administered 2020-06-20: 10 mg via INTRAVENOUS
  Filled 2020-06-20: qty 4

## 2020-06-20 MED ORDER — FUROSEMIDE 10 MG/ML IJ SOLN
40.0000 mg | Freq: Once | INTRAMUSCULAR | Status: AC
  Administered 2020-06-20: 40 mg via INTRAVENOUS
  Filled 2020-06-20: qty 4

## 2020-06-20 MED ORDER — POLYETHYLENE GLYCOL 3350 17 G PO PACK
17.0000 g | PACK | Freq: Once | ORAL | Status: DC
  Filled 2020-06-20: qty 1

## 2020-06-20 MED ORDER — DEXTROSE-NACL 5-0.45 % IV SOLN: INTRAVENOUS | Status: DC

## 2020-06-20 MED ORDER — HYDRALAZINE HCL 20 MG/ML IJ SOLN
10.0000 mg | Freq: Four times a day (QID) | INTRAMUSCULAR | Status: DC | PRN
  Administered 2020-06-20 – 2020-06-21 (×4): 10 mg via INTRAVENOUS
  Filled 2020-06-20 (×3): qty 1

## 2020-06-20 NOTE — Progress Notes (Signed)
PROGRESS NOTE    Chelsea Glass  OTL:[id number] DOB: 10-Sep-1956 DOA: 06/12/2020 PCP: [facility]    Brief Narrative:  Brought to the hospital because of altered mental status. History was obtained from ED physician and chart review. Patient is unable to provide an adequate history. Reportedly, EMS was called because patient had been laying in bed for about 5 days without getting up. Apparently, she had been laying in herfeces and urine. Per report, EMS had been called to the house about 4 days prior to admission but patient had refused transport to the hospital at that time.Brought to the hospital because of altered mental status. History was obtained from ED physician and chart review. Patient is unable to provide an adequate history. Reportedly, EMS was called because patient had been laying in bed for about 5 days without getting up. Apparently, she had been laying in herfeces and urine. Per report, EMS had been called to the house about 4 days prior to admission but patient had refused transport to the hospital at that time.  Assessment & Plan:   Active Problems:   Pneumonia due to COVID-19 virus   Acute respiratory failure with hypoxia (HCC)   Obesity, Class III, BMI 40-49.9 (morbid obesity) (HCC)   Severe sepsis ([place])   New onset atrial fibrillation (HCC)   Hypertension Severe sepsis- resolved.  -On admission patient met criteria for severe sepsis HR> 90,RR> 20, respiratory distress, lactic acid> 2 -Lactic acidosis has resolved post fluid resuscitation -Normal saline 28m/hr -Lactic acidosis resolved  Acute respiratory failure with hypoxia/Covid pneumonia COVID-19 Labs  Recent Labs    06/18/20 1630 06/19/20 0412 06/20/20 0601  FERRITIN  --  186 227  LDH  --  348* 323*  CRP 1.2* 4.5* 5.8*    -Solu-Medrol 60 mg BID -Remdesivir per pharmacy protocol. --Anticoagulation with Lovenox 62.5 mg subcu every 24. -Vitamin C and zinc per Covid  protocol -Incentive spirometry -Flutter valve  -Respimat QID 9/8: pt is sating in high 80's no distress / alert and awake but slow to answer seem to perseverate with answers.  9/9 SpO2: 95 % O2 Flow Rate (L/min): 11 L/min FiO2 (%): 65 % Dw/ daughter about moms intermittent hypoxia and D/W RT and nurse about plan for pt to keep her oxygen on but HFNC, d/w RT and Dr.Kasa about plan for possible bipap and guarded condition in case she needs to be transferred to ICU.   -9/10: SpO2: 95 % O2 Flow Rate (L/min): 11 L/min FiO2 (%): 65 % Pt seen today she is in no distress and oxygenating better with 15 L HFNC. spoke to daughter about starting baricitnab and she declined. Remdesivir  Therapy completed yesterday.  Pt also started on CAP coverage due to progressive infiltrates on chest xray.  covid labs pending. Currently pt stable on HFNC.  -9/11 SpO2: 95 % O2 Flow Rate (L/min): 11 L/min FiO2 (%): 65 % Pt is stable on bipap with o2 sats in high 90's.  Continue steroid therapy. D/W intensivist about pt's mental status and  Attribute to covid-19 encephalopathy. We will continue BIpap and monitor o2 saturation, with repeat ABG.   9/12 Vitals stable afebrile overnight, pt has responded well clinically with bipap we will make her start using intermittent bipap with HFNC an hour before and an hour after small meals And resuming bipap therapy. CRP going up and ferritin not resulted.   9/13 Vital show elevated bp as pt is not taking po she is confused  We will  start pt on hydralazine and change to bipap and start ivf as she is not eating or taking by mouth.  Called daughter today about moms condition and due to comorbidity she has guarded prognosis.  She has declined baricitinab and is cont to received steroids and supplemental oxygen.  SpO2: 95 % O2 Flow Rate (L/min): 11 L/min FiO2 (%): 65 % COVID-19 Labs  Recent Labs    06/18/20 1630 06/19/20 0412 06/20/20 0601  FERRITIN  --  186 227   LDH  --  348* 323*  CRP 1.2* 4.5* 5.8*    Lab Results  Component Value Date   SARSCOV2NAA POSITIVE (A) 06/12/2020   [place] NEGATIVE 07/31/2019      Diabetes type 2 uncontrolled with hyperglycemia -Lantus 20 units BID -NovoLog 8 units qac -Resistant SSI -9/6 Hemoglobin A1c= 8.2 -Consult diabetic coordinator; obese uncontrolled diabetic requires teaching -Consult diabetic nutritionist obese uncontrolled diabetic requires teaching -- NO change cont ssi/ and accuchecks. --will hold lantus and premeal insulin as pt is on bipap continuously.  -will hold insulin as pt is poor historian and currently is unable to obtain.   HLD -9/6 LDL= 234 -As soon as patient completes treatment for Covid would start patient on Lipitor 40 mg daily.  Essential HTN -Coreg 12.5 mg BID -Cardizem drip (discontinued), patient now in NSR --Patient continued on Coreg as mentioned above, home regimen of chlorthalidone and Aldactone have been discontinued. --will restart diuretic therapy with hctz at 12.5 mg as well and uptitrate.  --increased hctz to bid.  -cont with prn hydralazine.  New onset A. fib -See HTN --continue coreg pt in sinus rhythm now.   Hypokalemia -Potassium goal> 4 --will recheck and follow.   Hypomagnesmia -Magnesium goal> 2 - will recheck and follow.   Hypophosphatemia -Phosphorus goal> 2.5 We will follow levels.   Elevated liver enzymes -We will need to monitor closely but appear to be only slightly elevated and stable --current ast/alt of 43 and 81 and is improving.  Currently improving 37/ 74. --AST is WNL and alt is higher than before.  -- Ast of 29 and ALT of 74 it is lower than yesterday.    ~~~~~~~~~~~~~~~~~~~~~~~~~~~~~~~~~~~~~~~~~~~~~~~~~~~~~~~~~~~~~~~  DVT prophylaxis: Lovenox.  Code Status: Full Family Communication: delores daughter spoke to her about moms guarded prognosis.  Disposition Plan: d/c to STR in one week .  Status is:  Inpatient Remains inpatient appropriate because:Inpatient level of care appropriate due to severity of illness  Dispo: The patient is from: Home              Anticipated d/c is to: Home vs STR              Anticipated d/c date is: 5 days              Patient currently is not medically stable to d/c.   ~~~~~~~~~~~~~~~~~~~~~~~~~~~~~~~~~~~~~~~~~~~~~~~~~~~~~~~~~~~~~~~  Subjective:  9/7 afebrile overnight A/O x4, positive S OB, negative CP, negative abdominal pain, negative nausea, negative vomiting, negative diarrhea.  9/8 Pt seen today she is alert and answers questions appropriately.  Pt's oxygen has been in high 80's and 90's pt advised for proning and her sats came up. We will also get PT on board.  9/9 Pt is alert and awake but appears confused intermittently and slow to answer.  D/W daughter about mom today and we will try to get her oxygen level up and if needed intubate. Spoke to nick RT and pt is not in distress and follows commands. Pt denies  any complaints although  It is difficult to assess if she has poor insight , she is confused from covid-19 infection.   9/10 Pt seen today she is again awake and alert but confused somewhat. She was pulling out her oxygen last night and has soft mitten placed. Pt is hypoxic on ABG today d/w nurse about HFNC and d/w intensivist and mid 34 sat are goal. Labs show improving lft. Repeat chest xray shows increasing bl infiltrates and spoke to daughter about baricitnab and risk and benefit and she declined  T/t with it. BP is high we will have a manual repeat and prn hydralazine in case it is still high.hbis stable and kidney function is improving.   COVID-19 Labs  Recent Labs    06/18/20 1630 06/19/20 0412 06/20/20 0601  FERRITIN  --  186 227  LDH  --  348* 323*  CRP 1.2* 4.5* 5.8*    Lab Results  Component Value Date   SARSCOV2NAA POSITIVE (A) 06/12/2020   [place] NEGATIVE 07/31/2019   9/11 Pt seen today and she is still  confused ,but  Awake and follows commands but slow to respond. D/w daughter delrois who does not want mom to received baricitinab, I readdressed the baricitnab today , told daughter that there has  been positive response in many of my patients but she wants to not start it.  Currently all labs are pending.   9/12 Pt on cont bipap for past 24 hours and is more alert per report. COVID-19 Labs  Recent Labs    06/18/20 1630 06/19/20 0412 06/20/20 0601  FERRITIN  --  186 227  LDH  --  348* 323*  CRP 1.2* 4.5* 5.8*    Lab Results  Component Value Date   SARSCOV2NAA POSITIVE (A) 06/12/2020   [place] NEGATIVE 07/31/2019   9/13 SpO2: 95 % O2 Flow Rate (L/min): 11 L/min FiO2 (%): 65 % COVID-19 Labs  Recent Labs    06/18/20 1630 06/19/20 0412 06/20/20 0601  FERRITIN  --  186 227  LDH  --  348* 323*  CRP 1.2* 4.5* 5.8*    Lab Results  Component Value Date   SARSCOV2NAA POSITIVE (A) 06/12/2020   [place] NEGATIVE 07/31/2019    Objective: Vitals:   06/20/20 0606 06/20/20 0639 06/20/20 0700 06/20/20 1106  BP: (!) 159/143 (!) 175/122 (!) 159/112   Pulse: 92 96 93   Resp:   (!) 24   Temp:   98.6 F (37 C) 98.4 F (36.9 C)  TempSrc:   Axillary Oral  SpO2:  96% 95%   Weight:      Height:        Intake/Output Summary (Last 24 hours) at 06/20/2020 1119 Last data filed at 06/20/2020 0749 Gross per 24 hour  Intake 10 ml  Output 1300 ml  Net -1290 ml   Filed Weights   06/15/20 0539 06/16/20 0253 06/19/20 0440  Weight: 131.3 kg 126.6 kg 126.6 kg   Examination: Blood pressure (!) 159/112, pulse 93, temperature 98.4 F (36.9 C), temperature source Oral, resp. rate (!) 24, height _0  (1.676 m), weight 126.6 kg, SpO2 95 %.  General exam: Appears calm and comfortable  Respiratory system: BL Rhonchi in both lungs posteriorly.  Cardiovascular system: S1 & S2 heard, RRR. No JVD, murmurs, rubs, gallops or clicks. No pedal edema. Gastrointestinal system: Abdomen is  nondistended, soft and nontender. No organomegaly or masses felt. Normal bowel sounds heard. Central nervous system: Alert and oriented. No focal neurological deficits.  Extremities:CN are grossly intact. Pt moving all ext.  Skin: No rashes, lesions or ulcers Psychiatry: Judgement and insight appear normal. Mood & affect appropriate.   Data Reviewed:  CBC: Recent Labs  Lab 06/15/20 0404 06/16/20 6237 06/17/20 0502 06/19/20 0412 06/20/20 0601  WBC 10.8* 11.2* 14.2* 16.5* 12.9*  NEUTROABS 8.5* 9.1* 11.4* 12.9* 9.8*  HGB 14.4 14.3 15.5* 15.2* 15.2*  HCT 43.6 42.2 46.5* 45.8 43.9  MCV 82.0 81.2 81.7 82.1 80.0  PLT 212 204 257 349 628   Basic Metabolic Panel: Recent Labs  Lab 06/14/20 0510 06/14/20 0510 06/15/20 0404 06/16/20 0658 06/17/20 0502 06/17/20 1014 06/19/20 0412 06/20/20 0601  NA 134*   < > 138 139 141  --  141 139  K 4.0   < > 3.7 4.4 4.6  --  4.2 4.3  CL 99   < > 104 104 107  --  107 105  CO2 25   < > _0 --  25 23  GLUCOSE 194*   < > 122* 127* 99  --  168* 185*  BUN 35*   < > 41* 44* 40*  --  33* 34*  CREATININE 1.31*   < > 1.14* 1.10* 1.10*  --  0.85 0.95  CALCIUM 9.3   < > 9.4 9.4 9.4  --  10.0 9.9  MG 2.3   < > 2.5* 2.5* 2.6* 2.6* 2.3  --   PHOS 4.1  --  4.2 4.5 3.2  --  2.7  --    < > = values in this interval not displayed.   GFR: Estimated Creatinine Clearance: 80.3 mL/min (by C-G formula based on SCr of 0.95 mg/dL). Liver Function Tests: Recent Labs  Lab 06/15/20 0404 06/16/20 0658 06/17/20 0502 06/19/20 0412 06/20/20 0601  AST 61* 43* 37 40 29  ALT 86* 81* 74* 92* 74*  ALKPHOS 462* 411* 399* 412* 343*  BILITOT 0.8 1.0 1.0 0.8 0.9  PROT 6.6 6.3* 6.4* 6.6 6.9  ALBUMIN 2.8* 2.6* 2.7* 2.8* 2.7*    No results for input(s): HGBA1C in the last 72 hours. CBG: Recent Labs  Lab 06/19/20 1715 06/19/20 1945 06/20/20 0054 06/20/20 0512 06/20/20 0851  GLUCAP 141* 156* 136* 167* 175*   Anemia Panel: Recent Labs    06/19/20 0412  06/20/20 0601  FERRITIN 186 227   Sepsis Labs: Recent Labs  Lab 06/13/20 1321 06/14/20 0510 06/15/20 0404  PROCALCITON  --  0.28 0.12  LATICACIDVEN 1.4  --   --     Recent Results (from the past 240 hour(s))  SARS Coronavirus 2 by RT PCR (hospital order, performed in [facility] hospital lab) Nasopharyngeal Nasopharyngeal Swab     Status: Abnormal   Collection Time: 06/12/20  3:21 PM   Specimen: Nasopharyngeal Swab  Result Value Ref Range Status   SARS Coronavirus 2 POSITIVE (A) NEGATIVE Final    Comment: RESULT CALLED TO, READ BACK BY AND VERIFIED WITH: MEGAN JONES AT 1634 06/12/20.PMF (NOTE) SARS-CoV-2 target nucleic acids are DETECTED  SARS-CoV-2 RNA is generally detectable in upper respiratory specimens  during the acute phase of infection.  Positive results are indicative  of the presence of the identified virus, but do not rule out bacterial infection or co-infection with other pathogens not detected by the test.  Clinical correlation with patient history and  other diagnostic information is necessary to determine patient infection status.  The expected result is negative.  Fact Sheet for Patients:   StrictlyIdeas.no  Fact Sheet for Healthcare Providers:   BankingDealers.co.za    This test is not yet approved or cleared by the [place] FDA and  has been authorized for detection and/or diagnosis of SARS-CoV-2 by FDA under an Emergency Use Authorization (EUA).  This EUA will remain in effect (meaning this test c an be used) for the duration of  the COVID-19 declaration under Section 564(b)(1) of the Act, 21 U.S.C. section 360-bbb-3(b)(1), unless the authorization is terminated or revoked sooner.  Performed at [facility], [address] [place], [place] [address]   Blood culture (routine x 2)     Status: None   Collection Time: 06/12/20  3:21 PM   Specimen: BLOOD  Result Value Ref Range Status    Specimen Description BLOOD RIGHT FOREARM  Final   Special Requests   Final    BOTTLES DRAWN AEROBIC AND ANAEROBIC Blood Culture adequate volume   Culture   Final    NO GROWTH 5 DAYS Performed at [facility], [address], [place], [place] [address]    Report Status 06/17/2020 FINAL  Final  Blood culture (routine x 2)     Status: None   Collection Time: 06/12/20  3:21 PM   Specimen: BLOOD  Result Value Ref Range Status   Specimen Description BLOOD LEFT HAND  Final   Special Requests   Final    BOTTLES DRAWN AEROBIC AND ANAEROBIC Blood Culture adequate volume   Culture   Final    NO GROWTH 5 DAYS Performed at [facility], [address] [place], [place] [address]    Report Status 06/17/2020 FINAL  Final     Scheduled Meds: . vitamin C  500 mg Oral Daily  . carvedilol  12.5 mg Oral BID WC  . Chlorhexidine Gluconate Cloth  6 each Topical Daily  . enoxaparin (LOVENOX) injection  0.5 mg/kg Subcutaneous Q24H  . feeding supplement (NEPRO CARB STEADY)  237 mL Oral TID BM  . hydrochlorothiazide  12.5 mg Oral BID  . insulin aspart  0-20 Units Subcutaneous Q4H  . Ipratropium-Albuterol  1 puff Inhalation Q6H  . melatonin  2.5 mg Oral QHS  . methylPREDNISolone (SOLU-MEDROL) injection  60 mg Intravenous Q12H  . multivitamin with minerals  1 tablet Oral Daily  . sodium chloride flush  10-40 mL Intracatheter Q12H  . zinc sulfate  220 mg Oral Daily   Continuous Infusions: . azithromycin 500 mg (06/20/20 0744)  . cefTRIAXone (ROCEPHIN)  IV 2 g (06/20/20 1041)  . dextrose 5 % and 0.45% NaCl 75 mL/hr at 06/20/20 1045     LOS: 8 days    Para Skeans, MD [facility] Pager [phone] If 7PM-7AM, please contact night-coverage [email or web address] Password [facility] 06/20/2020, 11:19 AM

## 2020-06-20 NOTE — Progress Notes (Signed)
Cross Cover Brief Note Nurse reports tachycardia and hypertension.  D/c iv fluids lavetalol ordered IV lasix

## 2020-06-21 ENCOUNTER — Inpatient Hospital Stay: Payer: Medicare Other

## 2020-06-21 ENCOUNTER — Encounter: Payer: Self-pay | Admitting: Internal Medicine

## 2020-06-21 ENCOUNTER — Inpatient Hospital Stay (HOSPITAL_COMMUNITY)
Admit: 2020-06-21 | Discharge: 2020-06-21 | Disposition: A | Discharge status: Home / Self Care | Payer: Medicare Other | Attending: Internal Medicine | Admitting: Internal Medicine

## 2020-06-21 DIAGNOSIS — R079 Chest pain, unspecified: Secondary | ICD-10-CM

## 2020-06-21 DIAGNOSIS — I10 Essential (primary) hypertension: Secondary | ICD-10-CM

## 2020-06-21 DIAGNOSIS — N950 Postmenopausal bleeding: Secondary | ICD-10-CM

## 2020-06-21 DIAGNOSIS — N939 Abnormal uterine and vaginal bleeding, unspecified: Secondary | ICD-10-CM

## 2020-06-21 LAB — COMPREHENSIVE METABOLIC PANEL
ALT: 61 U/L — ABNORMAL HIGH (ref 0–44)
AST: 24 U/L (ref 15–41)
Albumin: 2.6 g/dL — ABNORMAL LOW (ref 3.5–5.0)
Alkaline Phosphatase: 298 U/L — ABNORMAL HIGH (ref 38–126)
Anion gap: 11 (ref 5–15)
BUN: 36 mg/dL — ABNORMAL HIGH (ref 8–23)
CO2: 24 mmol/L (ref 22–32)
Calcium: 9.7 mg/dL (ref 8.9–10.3)
Chloride: 102 mmol/L (ref 98–111)
Creatinine, Ser: 0.88 mg/dL (ref 0.44–1.00)
GFR calc Af Amer: >60 mL/min (ref >60)
GFR calc non Af Amer: >60 mL/min (ref >60)
Glucose, Bld: 307 mg/dL — ABNORMAL HIGH (ref 70–99)
Potassium: 4.2 mmol/L (ref 3.5–5.1)
Sodium: 137 mmol/L (ref 135–145)
Total Bilirubin: 0.7 mg/dL (ref 0.3–1.2)
Total Protein: 6.5 g/dL (ref 6.5–8.1)

## 2020-06-21 LAB — CBC WITH DIFFERENTIAL/PLATELET
Abs Immature Granulocytes: 0.5 10*3/uL — ABNORMAL HIGH (ref 0.00–0.07)
Basophils Absolute: 0 10*3/uL (ref 0.0–0.1)
Basophils Relative: 0 %
Eosinophils Absolute: 0 10*3/uL (ref 0.0–0.5)
Eosinophils Relative: 0 %
HCT: 43.4 % (ref 36.0–46.0)
Hemoglobin: 14.5 g/dL (ref 12.0–15.0)
Immature Granulocytes: 5 %
Lymphocytes Relative: 8 %
Lymphs Abs: 0.8 10*3/uL (ref 0.7–4.0)
MCH: 27.6 pg (ref 26.0–34.0)
MCHC: 33.4 g/dL (ref 30.0–36.0)
MCV: 82.7 fL (ref 80.0–100.0)
Monocytes Absolute: 1.3 10*3/uL — ABNORMAL HIGH (ref 0.1–1.0)
Monocytes Relative: 13 %
Neutro Abs: 7.9 10*3/uL — ABNORMAL HIGH (ref 1.7–7.7)
Neutrophils Relative %: 74 %
Platelets: 313 10*3/uL (ref 150–400)
RBC: 5.25 MIL/uL — ABNORMAL HIGH (ref 3.87–5.11)
RDW: 14.8 % (ref 11.5–15.5)
WBC: 10.6 10*3/uL — ABNORMAL HIGH (ref 4.0–10.5)
nRBC: 0 % (ref 0.0–0.2)

## 2020-06-21 LAB — GLUCOSE, CAPILLARY
Glucose-Capillary: 290 mg/dL — ABNORMAL HIGH (ref 70–99)
Glucose-Capillary: 256 mg/dL — ABNORMAL HIGH (ref 70–99)
Glucose-Capillary: 234 mg/dL — ABNORMAL HIGH (ref 70–99)
Glucose-Capillary: 129 mg/dL — ABNORMAL HIGH (ref 70–99)
Glucose-Capillary: 127 mg/dL — ABNORMAL HIGH (ref 70–99)

## 2020-06-21 LAB — FERRITIN: Ferritin: 251 ng/mL (ref 11–307)

## 2020-06-21 LAB — FIBRIN DERIVATIVES D-DIMER (ARMC ONLY): Fibrin derivatives D-dimer (ARMC): 317.23 ng/mL (FEU) (ref 0.00–499.00)

## 2020-06-21 LAB — LACTATE DEHYDROGENASE: LDH: 299 U/L — ABNORMAL HIGH (ref 98–192)

## 2020-06-21 LAB — ECHOCARDIOGRAM COMPLETE
Height: 66 in
S' Lateral: 2.22 cm
Weight: 4366.4 oz

## 2020-06-21 LAB — C-REACTIVE PROTEIN: CRP: 2.6 mg/dL — ABNORMAL HIGH (ref <1.0)

## 2020-06-21 IMAGING — CT CT ANGIO CHEST
2 of 6 series · 17 of 46 positions shown · IV contrast (omnipaque)
Comparison: Chest radiograph [DATE]

CLINICAL DATA: Respiratory failure.  [VS] positive

EXAM:
CT ANGIOGRAPHY CHEST WITH CONTRAST
TECHNIQUE: Multidetector CT imaging of the chest was performed using the
standard protocol during bolus administration of intravenous
contrast. Multiplanar CT image reconstructions and MIPs were
obtained to evaluate the vascular anatomy.
CONTRAST:  75mL OMNIPAQUE IOHEXOL 350 MG/ML SOLN

[Series 6: thins · axial · 0.65mm/px · z∈[-1002,-811]mm · 15 of 211 slices shown]
[im 10/211  lung]
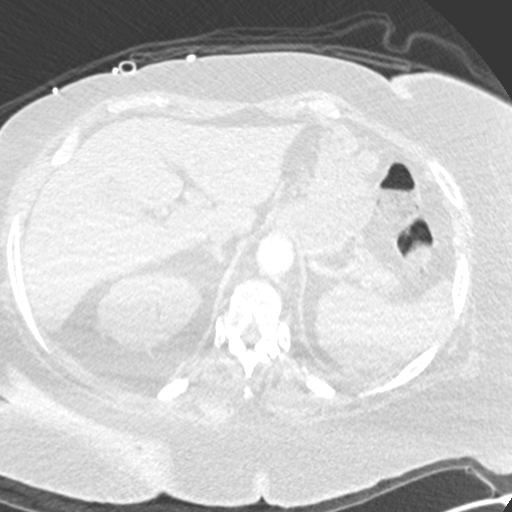
[im 28/211  soft-tissue]
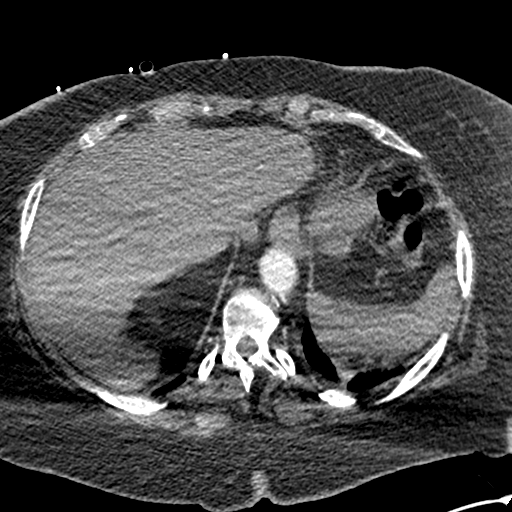
[im 37/211  lung]
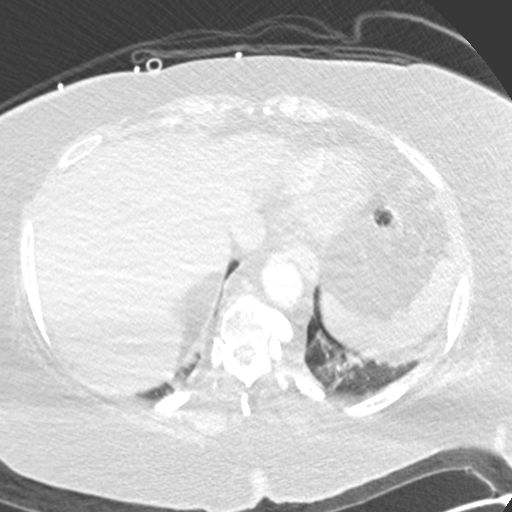
[im 55/211  soft-tissue]
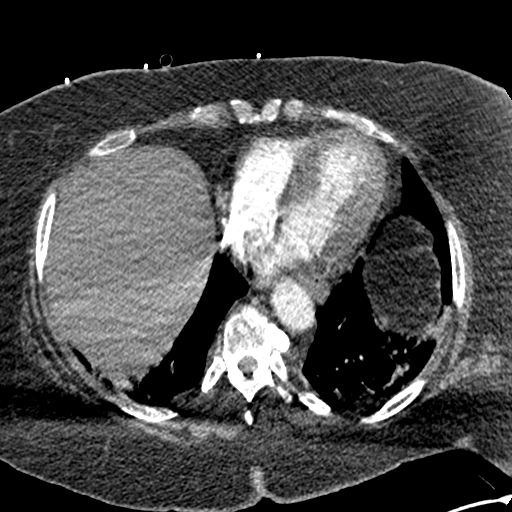
[im 64/211  lung]
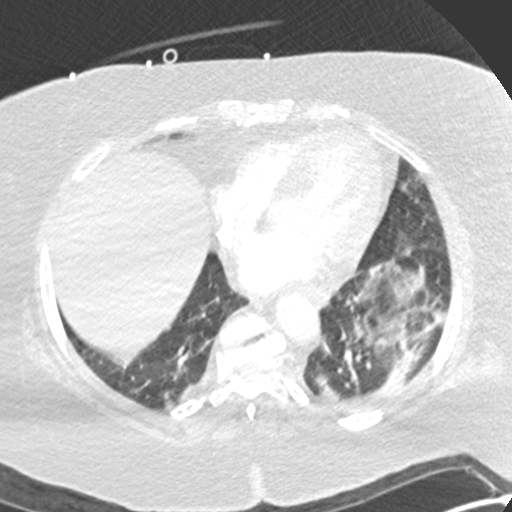
[im 83/211  soft-tissue]
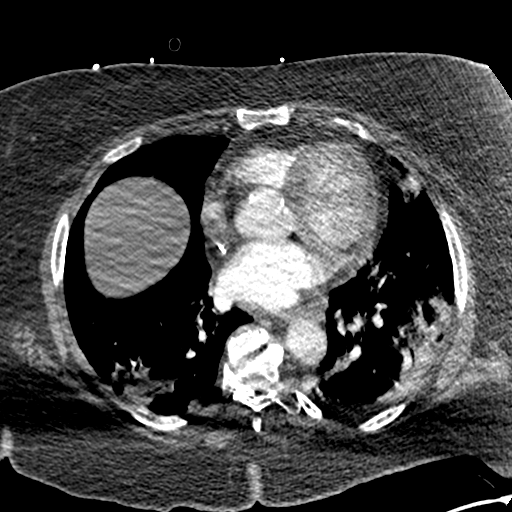
[im 92/211  lung]
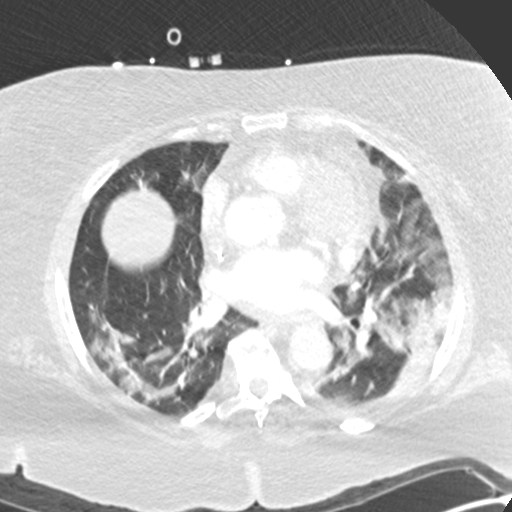
[im 110/211  soft-tissue]
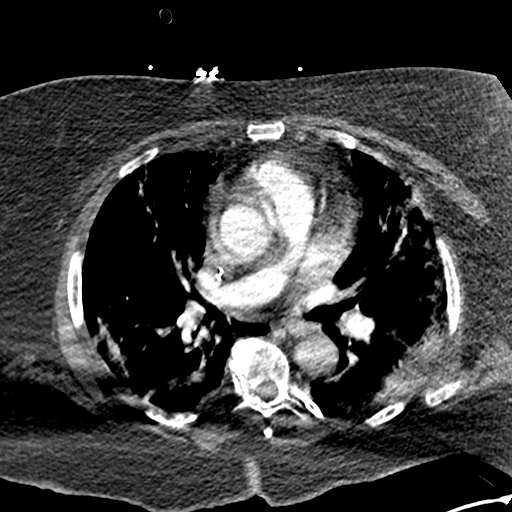
[im 119/211  lung]
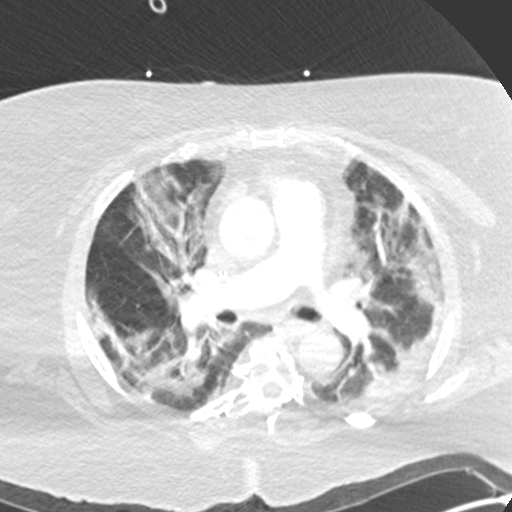
[im 128/211  soft-tissue]
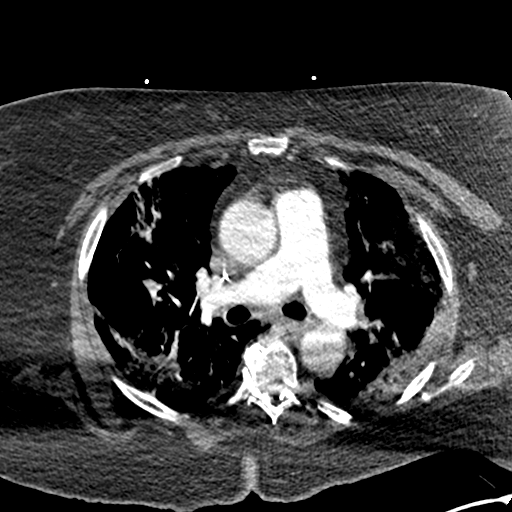
[im 147/211  lung]
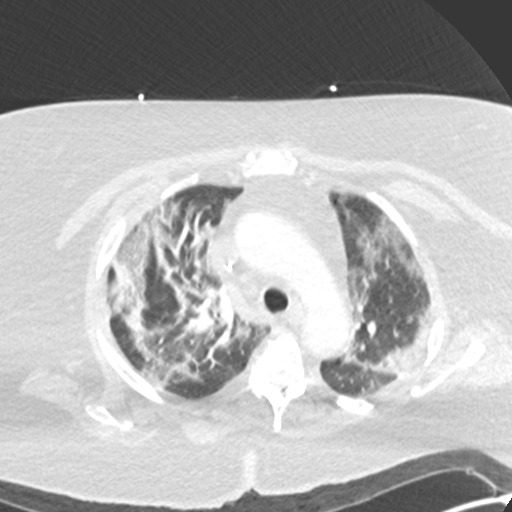
[im 156/211  soft-tissue]
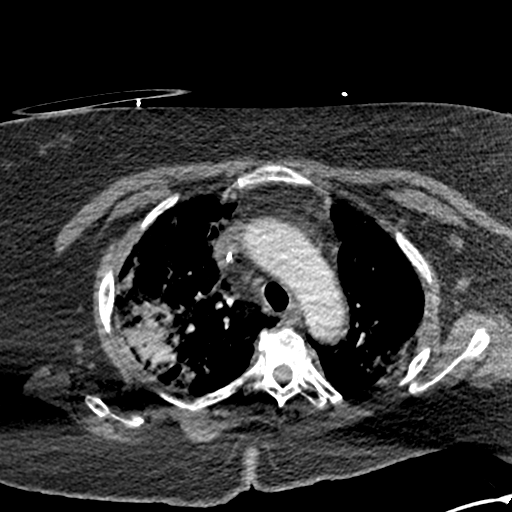
[im 174/211  lung]
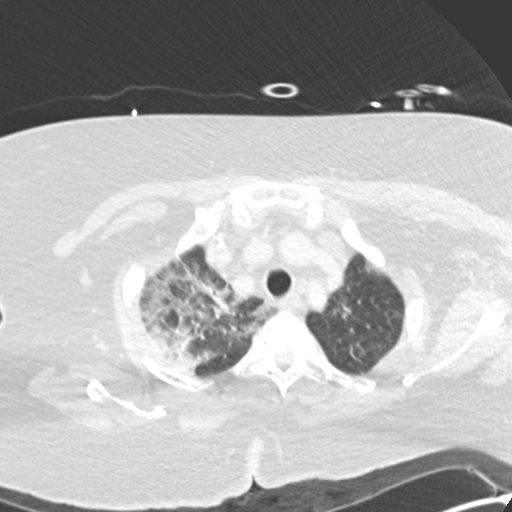
[im 183/211  soft-tissue]
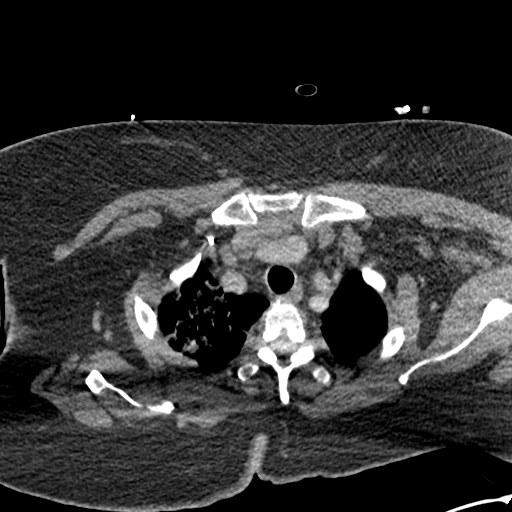
[im 201/211  lung]
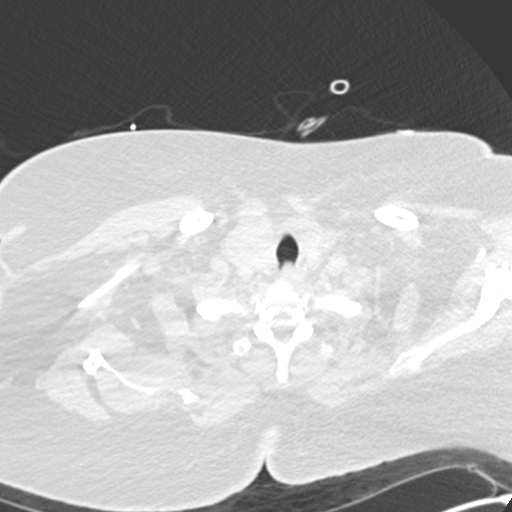

[Series 8: coronal mpr · coronal · 0.49mm/px · 2 of 85 slices shown]
[im 29/85  soft-tissue]
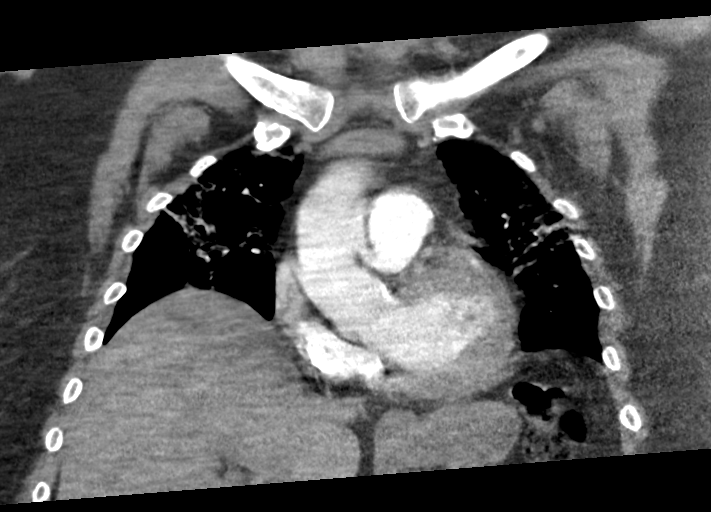
[im 57/85  soft-tissue]
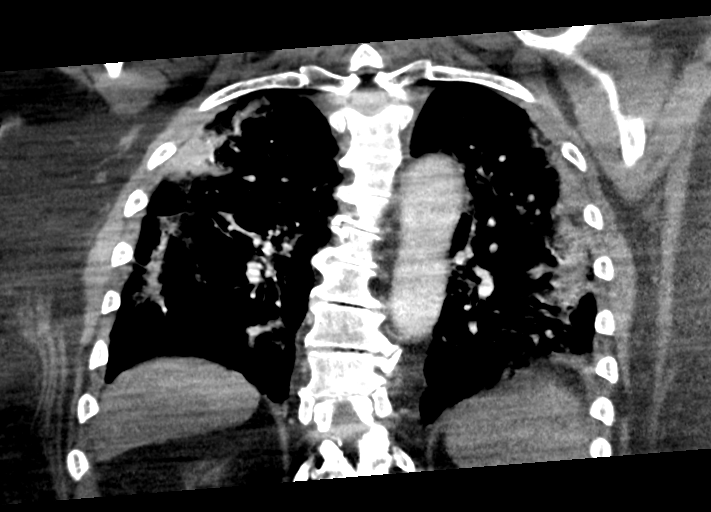

[17 of 46 positions shown; findings below may reference images not displayed]

FINDINGS: Cardiovascular: There is no demonstrable pulmonary embolus. There is
no appreciable thoracic aortic aneurysm or dissection. A small focus
of calcification is noted in the left subclavian artery. There is
also calcification in the proximal right subclavian artery. Note
that the right innominate and left common carotid arteries arise as
a common trunk, an anatomic variant. There are foci of aortic
atherosclerosis. There are foci of coronary artery calcification.
There is no pericardial effusion or pericardial thickening. Central
catheter tip is in the distal superior vena cava near the cavoatrial
junction.

Mediastinum/Nodes: There is a mass arising from the right lobe of
the thyroid measuring 2.9 x 2.8 cm.

There is no appreciable thoracic adenopathy. No esophageal lesions
are evident.

Lungs/Pleura: There is extensive multifocal airspace opacity
throughout the lungs diffusely. There are foci of consolidation in
each lung base as well as in a portion of the posterior segment
right upper lobe. No pleural effusions are appreciable.

Upper Abdomen: Visualized upper abdominal structures appear
unremarkable.

Musculoskeletal: Degenerative changes noted in the thoracic spine.
There is lower thoracic dextroscoliosis. There are no blastic or
lytic bone lesions. No evident chest wall lesions.

Review of the MIP images confirms the above findings.
IMPRESSION: 1. No demonstrable pulmonary embolus. No thoracic aortic aneurysm or
dissection. There is aortic atherosclerosis as well as foci of great
vessel and coronary artery calcification.

2. Multifocal pneumonia. Suspect atypical organism pneumonia. Areas
of consolidation also noted in the lung bases and right upper lobe
posteriorly. There may be a degree of bacterial superinfection in
these areas.

3.  No adenopathy appreciable.

4. Dominant mass arising from the right lobe of the thyroid
measuring 2.9 x 2.8 cm. Recommend thyroid US per consensus
guidelines. (Ref: [HOSPITAL]. [DATE]): 143-50)

Aortic Atherosclerosis ([VS]-[VS]).

## 2020-06-21 MED ORDER — POLYETHYLENE GLYCOL 3350 17 G PO PACK
17.0000 g | PACK | Freq: Once | ORAL | Status: DC
  Filled 2020-06-21: qty 1

## 2020-06-21 MED ORDER — INSULIN GLARGINE 100 UNIT/ML ~~LOC~~ SOLN
10.0000 [IU] | Freq: Every day | SUBCUTANEOUS | Status: DC
  Administered 2020-06-21: 10 [IU] via SUBCUTANEOUS
  Filled 2020-06-21 (×2): qty 0.1

## 2020-06-21 MED ORDER — IOHEXOL 350 MG/ML SOLN
100.0000 mL | Freq: Once | INTRAVENOUS | Status: AC | PRN
  Administered 2020-06-21: 75 mL via INTRAVENOUS

## 2020-06-21 MED ORDER — INSULIN ASPART 100 UNIT/ML ~~LOC~~ SOLN
0.0000 [IU] | Freq: Three times a day (TID) | SUBCUTANEOUS | Status: DC
  Administered 2020-06-21: 2 [IU] via SUBCUTANEOUS
  Administered 2020-06-22 (×2): 5 [IU] via SUBCUTANEOUS
  Administered 2020-06-22: 3 [IU] via SUBCUTANEOUS
  Administered 2020-06-23 – 2020-06-24 (×4): 5 [IU] via SUBCUTANEOUS
  Administered 2020-06-24: 3 [IU] via SUBCUTANEOUS
  Administered 2020-06-24: 2 [IU] via SUBCUTANEOUS
  Administered 2020-06-25: 5 [IU] via SUBCUTANEOUS
  Administered 2020-06-25: 3 [IU] via SUBCUTANEOUS
  Administered 2020-06-26: 2 [IU] via SUBCUTANEOUS
  Administered 2020-06-26: 5 [IU] via SUBCUTANEOUS
  Administered 2020-06-27: 2 [IU] via SUBCUTANEOUS
  Filled 2020-06-21 (×14): qty 1

## 2020-06-21 MED ORDER — INSULIN ASPART 100 UNIT/ML ~~LOC~~ SOLN
0.0000 [IU] | Freq: Every day | SUBCUTANEOUS | Status: DC
  Administered 2020-06-22 – 2020-06-26 (×3): 2 [IU] via SUBCUTANEOUS
  Filled 2020-06-21 (×3): qty 1

## 2020-06-21 NOTE — Evaluation (Signed)
Physical Therapy Evaluation Patient Details Name: Chelsea Glass MRN: [id number] DOB: 04/25/1956 Today's Date: 06/21/2020   History of Present Illness  Wynetta Yardley is a 63yoF who comes to [facility] 9/5 c AMS, pt had been in bed for 5 days, found soiled. PMH: HTN, DM, OA, obesity.  Pt (+) c COVID, in AF upon arrival. Per DTR, pt was fully independent at baseline PTA, still driving, caring for dependents, cooking, no falls history, no use of DME. Pt did have some mild tolerance issues due to hip OA (per DTR).   Clinical Impression  Pt admitted with above diagnosis. Pt currently with functional limitations due to the deficits listed below (see "PT Problem List"). Upon entry, pt in bed, awake, confused, very delayed in response and initiation. Pt generally hypophonic in responses, somewhat slurred in speech. Pt struggles with taking part in questioning, mostly from a mentation standpoint, lots of difficulty finding the words she desires. History later taken from pt's DTR Deloris Chambers via telephone. Pt follows simple commands <50% of time in session. Pt able to pull self up into long sittign with RUE and remain there balanced, but requires MaxA+2 for long sitting to/from EOB. At EOB, pt tolerates unsupported sitting for ~10 minutes, really not able to participate in leg exercises as cued. Pt remains on 11L HFNC without any frank desaturation in session . Functional mobility assessment demonstrates increased effort/time requirements, fair tolerance, and need for heavy +2 physical assistance, whereas the patient performed these at a higher level of independence PTA. Pt will likely need a stay at STR at [place] to progress he strength and independence to a level appropriate for safe transiton to her DTR's house eventually. Pt will benefit from skilled PT intervention to increase independence and safety with basic mobility in preparation for discharge to the venue listed below.       Follow Up Recommendations  SNF;Supervision for mobility/OOB    Equipment Recommendations  Other (comment) (defer to facility at [place])    Recommendations for Other Services       Precautions / Restrictions        Mobility  Bed Mobility Overal bed mobility: Needs Assistance Bed Mobility: Supine to Sit;Sit to Supine     Supine to sit: Total assist;+2 for physical assistance Sit to supine: Total assist;+2 for physical assistance      Transfers Overall transfer level:  (not safe to attempt)                  Ambulation/Gait                Stairs            Wheelchair Mobility    Modified Rankin (Stroke Patients Only)       Balance Overall balance assessment: Needs assistance Sitting-balance support: No upper extremity supported;Feet supported Sitting balance-Leahy Scale: Good                                       Pertinent Vitals/Pain Pain Assessment: No/denies pain    Home Living Family/patient expects to be discharged to:: Private residence Living Arrangements: Other relatives;Children (DTR works from home, planning on helping at [place].) Available Help at Discharge: Family Type of Home: House (DTR's home at [place]) Home Access: Stairs to enter Entrance Stairs-Rails: Right Entrance Stairs-Number of Steps: 3 Home Layout: One level Home Equipment: None Additional Comments: no prior DME use; was  limited in mobility PTA due to osteoarthristis (DTR).    Prior Function Level of Independence: Independent         Comments: still driving PTA, making groceries     Hand Dominance        Extremity/Trunk Assessment   Upper Extremity Assessment Upper Extremity Assessment: Overall WFL for tasks assessed;Generalized weakness;Difficult to assess due to impaired cognition    Lower Extremity Assessment Lower Extremity Assessment: Difficult to assess due to impaired cognition;Overall [facility] for tasks assessed    Cervical / Trunk Assessment Cervical / Trunk  Assessment:  (very weak)  Communication      Cognition Arousal/Alertness: Lethargic   Overall Cognitive Status: Impaired/Different from baseline Area of Impairment: Orientation;Attention;Memory;Following commands;Problem solving                 Orientation Level: Person Current Attention Level: Divided;Alternating Memory: Decreased recall of precautions;Decreased short-term memory Following Commands: Follows one step commands inconsistently;Follows one step commands with increased time     Problem Solving: Slow processing;Decreased initiation;Requires verbal cues;Requires tactile cues        General Comments      Exercises General Exercises - Lower Extremity Long Arc Quad: AROM;Left;5 reps (becomes fatigued, requires constant facilitation to attend to task)   Assessment/Plan    PT Assessment Patient needs continued PT services  PT Problem List Decreased strength;Decreased activity tolerance;Decreased balance;Decreased mobility;Decreased coordination;Decreased safety awareness;Decreased cognition;Obesity;Decreased knowledge of use of DME;Decreased knowledge of precautions;Cardiopulmonary status limiting activity       PT Treatment Interventions DME instruction;Balance training;Gait training;Stair training;Functional mobility training;Therapeutic activities;Therapeutic exercise;Patient/family education;Wheelchair mobility training;Cognitive remediation;Neuromuscular re-education    PT Goals (Current goals can be found in the Care Plan section)  Acute Rehab PT Goals Patient Stated Goal: be able to return to DTRs home PT Goal Formulation: With family Time For Goal Achievement: 07/05/20 Potential to Achieve Goals: Fair    Frequency Min 2X/week   Barriers to discharge        Co-evaluation               AM-PAC PT "6 Clicks" Mobility  Outcome Measure Help needed turning from your back to your side while in a flat bed without using bedrails?: Total Help needed  moving from lying on your back to sitting on the side of a flat bed without using bedrails?: Total Help needed moving to and from a bed to a chair (including a wheelchair)?: Total Help needed standing up from a chair using your arms (e.g., wheelchair or bedside chair)?: Total Help needed to walk in hospital room?: Total Help needed climbing 3-5 steps with a railing? : Total 6 Click Score: 6    End of Session Equipment Utilized During Treatment: Oxygen Activity Tolerance: Patient limited by fatigue;Patient limited by lethargy Patient left: in bed;with call bell/phone within reach;with bed alarm set   PT Visit Diagnosis: Muscle weakness (generalized) (M62.81);Difficulty in walking, not elsewhere classified (R26.2);Other symptoms and signs involving the nervous system (R29.898)    Time: 1937-9024 PT Time Calculation (min) (ACUTE ONLY): 23 min   Charges:   PT Evaluation $PT Eval Moderate Complexity: 1 Mod PT Treatments $Therapeutic Exercise: 8-22 mins        4:04 PM, 06/21/20 Rosamaria Lints, PT, DPT Physical Therapist - [facility]  [phone] (ASCOM)   Najeeb Uptain C 06/21/2020, 3:57 PM

## 2020-06-21 NOTE — Consult Note (Addendum)
Obstetrics & Gynecology Consult H&P   Consulting Department: Hospitals  Consulting Physician: Irena CordsPatel, Ekta MD  Consulting Question: Postmenopausal Bleeding   History of Present Illness: Patient is a 64 y.o. admitted for COVID pneumonia and hypoxia.  Was noted to have vaginal bleeding during this admission with ultrasound revealed in fibroid uterus and endometrial stripe of 7mm without focal abnormalities.  The patient denies vaginal bleeding prior to admission.  She has been on prophylactic dose lovenox during the course of this admission.  The patient does have a history of postmenopausal bleeding in the past with work up being negative (see below).  I do not have pathology result from her most recent D&C in 2015. Pap smear is also up to date with last pap in 2018 NILM.  She denies new onset abdominal bloating or pain.  Prior evaluation for postmenopausal bleeding were negative 1. 09/2013- [facility] 6.11mm distorted by uterine fibroids, EMB pathology, fragments of benign endometrial polyp and atrophic endometrium. No hyperplasia or carcinoma 2. 07/2009- She had a hysteroscopy/D&C with polypectomy after being found on EMB done for postmenopausal bleeding (05/2009 and 06/2009), 11/2008 [facility] 4.578mm, fibroids visualized pathology: Multiple polypoid fragments of disordered proliferative endometrium. No atypical hyperplasia or malignancy identified. 3. 06/2009 EMB pathology: Endocervical polyp. Benign endometrium ranging from weakly proliferative endometrium to cystic atrophy. No evidence of hyperplasia, atypia, or malignancy. 4. 2005: perimenopausal- EMB Fragments of benign endocervical tissue and endometrial tissue with proliferative phase glands and breakdown, Hyperplasia and carcinoma is not seen.    Review of Systems:10 point review of systems  Past Medical History:  Patient Active Problem List   Diagnosis Date Noted  . Hypertension   . Acute respiratory failure with hypoxia (HCC) 06/13/2020   . Obesity, Class III, BMI 40-49.9 (morbid obesity) (HCC) 06/13/2020  . Severe sepsis (HCC) 06/13/2020  . New onset atrial fibrillation (HCC) 06/13/2020  . Pneumonia due to COVID-19 virus 06/12/2020    Past Surgical History:  Past Surgical History:  Procedure Laterality Date  . TUBAL LIGATION    . UTERINE FIBROID SURGERY      Gynecologic History:   Obstetric History: No obstetric history on file.  Family History:  History reviewed. No pertinent family history.  Social History:  Social History   Socioeconomic History  . Marital status: Widowed    Spouse name: Not on file  . Number of children: Not on file  . Years of education: Not on file  . Highest education level: Not on file  Occupational History  . Not on file  Tobacco Use  . Smoking status: Never Smoker  . Smokeless tobacco: Never Used  Substance and Sexual Activity  . Alcohol use: No  . Drug use: No  . Sexual activity: Not on file  Other Topics Concern  . Not on file  Social History Narrative  . Not on file   Social Determinants of Health   Financial Resource Strain:   . Difficulty of Paying Living Expenses: Not on file  Food Insecurity:   . Worried About Programme researcher, broadcasting/film/videounning Out of Food in the Last Year: Not on file  . Ran Out of Food in the Last Year: Not on file  Transportation Needs:   . Lack of Transportation (Medical): Not on file  . Lack of Transportation (Non-Medical): Not on file  Physical Activity:   . Days of Exercise per Week: Not on file  . Minutes of Exercise per Session: Not on file  Stress:   . Feeling of Stress :  Not on file  Social Connections:   . Frequency of Communication with Friends and Family: Not on file  . Frequency of Social Gatherings with Friends and Family: Not on file  . Attends Religious Services: Not on file  . Active Member of Clubs or Organizations: Not on file  . Attends Banker Meetings: Not on file  . Marital Status: Not on file  Intimate Partner Violence:     . Fear of Current or Ex-Partner: Not on file  . Emotionally Abused: Not on file  . Physically Abused: Not on file  . Sexually Abused: Not on file    Allergies:  Allergies  Allergen Reactions  . Lisinopril Swelling    Medications: Prior to Admission medications   Medication Sig Start Date End Date Taking? Authorizing Provider  carvedilol (COREG) 12.5 MG tablet Take 1 tablet (12.5 mg total) by mouth 2 (two) times daily with a meal. 09/22/16  Yes Darci Current, MD  diphenhydrAMINE (BENADRYL) 25 mg capsule Take 1 capsule (25 mg total) by mouth every 4 (four) hours as needed. 08/01/16 06/12/20 Yes Emily Filbert, MD  meloxicam (MOBIC) 15 MG tablet Take 15 mg by mouth daily.  06/03/20  Yes [provider]  metFORMIN (GLUCOPHAGE-XR) 500 MG 24 hr tablet Take 1,000 mg by mouth daily with breakfast.   Yes [provider]  methocarbamol (ROBAXIN) 500 MG tablet Take 500 mg by mouth 2 (two) times daily. 05/20/20  Yes [provider]  chlorthalidone (HYGROTON) 25 MG tablet Take 25 mg by mouth daily. Patient not taking: Reported on 06/12/2020    [provider]  hydrOXYzine (ATARAX/VISTARIL) 25 MG tablet Take 25 mg by mouth 3 (three) times daily as needed. Patient not taking: Reported on 06/12/2020    [provider]  metFORMIN (GLUCOPHAGE) 500 MG tablet Take 1,000 mg by mouth daily. Patient not taking: Reported on 06/12/2020 06/21/09   [provider]  spironolactone (ALDACTONE) 50 MG tablet Take 50 mg by mouth daily. Patient not taking: Reported on 06/12/2020    [provider]    Physical Exam Vitals: Blood pressure (!) 144/98, pulse 89, temperature 97.8 F (36.6 C), temperature source Oral, resp. rate 20, height 5\' 6"  (1.676 m), weight 123.8 kg, SpO2 97 %.  General: NAD HEENT: normocephalic, anicteric Abdomen: obese, soft, non-tender, non-distended Extremities: no edema, erythema, or tenderness Neurologic: Grossly  intact Psychiatric: mood appropriate, affect full  Labs: Results for orders placed or performed during the hospital encounter of 06/12/20 (from the past 72 hour(s))  Glucose, capillary     Status: Abnormal   Collection Time: 06/18/20  8:14 PM  Result Value Ref Range   Glucose-Capillary 115 (H) 70 - 99 mg/dL    Comment: Glucose reference range applies only to samples taken after fasting for at least 8 hours.  Glucose, capillary     Status: Abnormal   Collection Time: 06/18/20 10:59 PM  Result Value Ref Range   Glucose-Capillary 133 (H) 70 - 99 mg/dL    Comment: Glucose reference range applies only to samples taken after fasting for at least 8 hours.  Ferritin     Status: None   Collection Time: 06/19/20  4:12 AM  Result Value Ref Range   Ferritin 186 11 - 307 ng/mL    Comment: Performed at [facility], [address] [place], [place] [address]  Fibrin derivatives D-Dimer [facility] only)     Status: None   Collection Time: 06/19/20  4:12 AM  Result Value Ref Range   Fibrin derivatives D-dimer ([facility]) 283.71 0.00 - 499.00 ng/mL (FEU)    Comment: (NOTE) <> Exclusion of Venous Thromboembolism (VTE) - OUTPATIENT ONLY   (Emergency Department or [place])    0-499 ng/ml (FEU): With a low to intermediate pretest probability                      for VTE this test result excludes the diagnosis                      of VTE.   >499 ng/ml (FEU) : VTE not excluded; additional work up for VTE is                      required.  <> Testing on Inpatients and Evaluation of Disseminated Intravascular   Coagulation (DIC) Reference Range:   0-499 ng/ml (FEU) Performed at [facility], [address] [place], [place] [address]   C-reactive protein     Status: Abnormal   Collection Time: 06/19/20  4:12 AM  Result Value Ref Range   CRP 4.5 (H) <1.0 mg/dL    Comment: Performed at [facility], [address] [place], [place] [address]  Lactate dehydrogenase     Status: Abnormal    Collection Time: 06/19/20  4:12 AM  Result Value Ref Range   LDH 348 (H) 98 - 192 U/L    Comment: Performed at [facility], [address] [place], [place] [address]  Magnesium     Status: None   Collection Time: 06/19/20  4:12 AM  Result Value Ref Range   Magnesium 2.3 1.7 - 2.4 mg/dL    Comment: Performed at [facility], [address] [place], [place] [address]  Phosphorus     Status: None   Collection Time: 06/19/20  4:12 AM  Result Value Ref Range   Phosphorus 2.7 2.5 - 4.6 mg/dL    Comment: Performed at [facility], [address] [place], [place] [address]  CBC with Differential/Platelet     Status: Abnormal   Collection Time: 06/19/20  4:12 AM  Result Value Ref Range   WBC 16.5 (H) 4.0 - 10.5 K/uL   RBC 5.58 (H) 3.87 - 5.11 MIL/uL   Hemoglobin 15.2 (H) 12.0 - 15.0 g/dL   HCT 49.6 36 - 46 %   MCV 82.1 80.0 - 100.0 fL   MCH 27.2 26.0 - 34.0 pg   MCHC 33.2 30.0 - 36.0 g/dL   RDW 75.9 16.3 - 84.6 %   Platelets 349 150 - 400 K/uL   nRBC 0.0 0.0 - 0.2 %   Neutrophils Relative % 79 %   Neutro Abs 12.9 (H) 1.7 - 7.7 K/uL   Lymphocytes Relative 8 %   Lymphs Abs 1.4 0.7 - 4.0 K/uL   Monocytes Relative 9 %   Monocytes Absolute 1.5 (H) 0 - 1 K/uL   Eosinophils Relative 0 %   Eosinophils Absolute 0.0 0 - 0 K/uL   Basophils Relative 0 %   Basophils Absolute 0.0 0 - 0 K/uL   Immature Granulocytes 4 %   Abs Immature Granulocytes 0.69 (H) 0.00 - 0.07 K/uL    Comment: Performed at [facility], [address] [place], [place] [address]  Comprehensive metabolic panel     Status: Abnormal   Collection Time: 06/19/20  4:12 AM  Result Value Ref Range   Sodium 141  135 - 145 mmol/L   Potassium 4.2 3.5 - 5.1 mmol/L   Chloride 107 98 - 111 mmol/L   CO2 25 22 - 32 mmol/L   Glucose, Bld 168 (H) 70 - 99 mg/dL    Comment: Glucose reference range applies only to samples taken after fasting for at least 8 hours.   BUN 33 (H) 8 - 23 mg/dL    Creatinine, Ser 1.61 0.44 - 1.00 mg/dL   Calcium 09.6 8.9 - 04.5 mg/dL   Total Protein 6.6 6.5 - 8.1 g/dL   Albumin 2.8 (L) 3.5 - 5.0 g/dL   AST 40 15 - 41 U/L   ALT 92 (H) 0 - 44 U/L   Alkaline Phosphatase 412 (H) 38 - 126 U/L   Total Bilirubin 0.8 0.3 - 1.2 mg/dL   GFR calc non Af Amer >60 >60 mL/min   GFR calc Af Amer >60 >60 mL/min   Anion gap 9 5 - 15    Comment: Performed at [facility], [address] [place], [place] [address]  Glucose, capillary     Status: Abnormal   Collection Time: 06/19/20  4:16 AM  Result Value Ref Range   Glucose-Capillary 140 (H) 70 - 99 mg/dL    Comment: Glucose reference range applies only to samples taken after fasting for at least 8 hours.  Glucose, capillary     Status: Abnormal   Collection Time: 06/19/20  8:18 AM  Result Value Ref Range   Glucose-Capillary 131 (H) 70 - 99 mg/dL    Comment: Glucose reference range applies only to samples taken after fasting for at least 8 hours.  Glucose, capillary     Status: Abnormal   Collection Time: 06/19/20 12:51 PM  Result Value Ref Range   Glucose-Capillary 126 (H) 70 - 99 mg/dL    Comment: Glucose reference range applies only to samples taken after fasting for at least 8 hours.  Glucose, capillary     Status: Abnormal   Collection Time: 06/19/20  5:15 PM  Result Value Ref Range   Glucose-Capillary 141 (H) 70 - 99 mg/dL    Comment: Glucose reference range applies only to samples taken after fasting for at least 8 hours.  Glucose, capillary     Status: Abnormal   Collection Time: 06/19/20  7:45 PM  Result Value Ref Range   Glucose-Capillary 156 (H) 70 - 99 mg/dL    Comment: Glucose reference range applies only to samples taken after fasting for at least 8 hours.  Glucose, capillary     Status: Abnormal   Collection Time: 06/20/20 12:54 AM  Result Value Ref Range   Glucose-Capillary 136 (H) 70 - 99 mg/dL    Comment: Glucose reference range applies only to samples taken after fasting for  at least 8 hours.  Glucose, capillary     Status: Abnormal   Collection Time: 06/20/20  5:12 AM  Result Value Ref Range   Glucose-Capillary 167 (H) 70 - 99 mg/dL    Comment: Glucose reference range applies only to samples taken after fasting for at least 8 hours.  Ferritin     Status: None   Collection Time: 06/20/20  6:01 AM  Result Value Ref Range   Ferritin 227 11 - 307 ng/mL    Comment: Performed at [facility], [address] [place], [place] [address]  Fibrin derivatives D-Dimer [facility] only)     Status: None   Collection Time: 06/20/20  6:01 AM  Result Value Ref Range  Fibrin derivatives D-dimer ([facility]) 391.54 0.00 - 499.00 ng/mL (FEU)    Comment: (NOTE) <> Exclusion of Venous Thromboembolism (VTE) - OUTPATIENT ONLY   (Emergency Department or [place])    0-499 ng/ml (FEU): With a low to intermediate pretest probability                      for VTE this test result excludes the diagnosis                      of VTE.   >499 ng/ml (FEU) : VTE not excluded; additional work up for VTE is                      required.  <> Testing on Inpatients and Evaluation of Disseminated Intravascular   Coagulation (DIC) Reference Range:   0-499 ng/ml (FEU) Performed at [facility], [address] [place], [place] [address]   C-reactive protein     Status: Abnormal   Collection Time: 06/20/20  6:01 AM  Result Value Ref Range   CRP 5.8 (H) <1.0 mg/dL    Comment: Performed at [facility], [address] [place], [place] [address]  Lactate dehydrogenase     Status: Abnormal   Collection Time: 06/20/20  6:01 AM  Result Value Ref Range   LDH 323 (H) 98 - 192 U/L    Comment: Performed at [facility], [address] [place], [place] [address]  CBC with Differential/Platelet     Status: Abnormal   Collection Time: 06/20/20  6:01 AM  Result Value Ref Range   WBC 12.9 (H) 4.0 - 10.5 K/uL   RBC 5.49 (H) 3.87 - 5.11 MIL/uL   Hemoglobin 15.2 (H) 12.0 -  15.0 g/dL   HCT 91.4 36 - 46 %   MCV 80.0 80.0 - 100.0 fL   MCH 27.7 26.0 - 34.0 pg   MCHC 34.6 30.0 - 36.0 g/dL   RDW 78.2 95.6 - 21.3 %   Platelets 347 150 - 400 K/uL   nRBC 0.0 0.0 - 0.2 %   Neutrophils Relative % 75 %   Neutro Abs 9.8 (H) 1.7 - 7.7 K/uL   Lymphocytes Relative 10 %   Lymphs Abs 1.3 0.7 - 4.0 K/uL   Monocytes Relative 9 %   Monocytes Absolute 1.1 (H) 0 - 1 K/uL   Eosinophils Relative 0 %   Eosinophils Absolute 0.0 0 - 0 K/uL   Basophils Relative 0 %   Basophils Absolute 0.0 0 - 0 K/uL   WBC Morphology MILD LEFT SHIFT (1-5% METAS, OCC MYELO, OCC BANDS)    RBC Morphology MORPHOLOGY UNREMARKABLE    Smear Review Normal platelet morphology    Immature Granulocytes 6 %   Abs Immature Granulocytes 0.72 (H) 0.00 - 0.07 K/uL    Comment: Performed at [facility], [address] [place], [place] [address]  Comprehensive metabolic panel     Status: Abnormal   Collection Time: 06/20/20  6:01 AM  Result Value Ref Range   Sodium 139 135 - 145 mmol/L   Potassium 4.3 3.5 - 5.1 mmol/L   Chloride 105 98 - 111 mmol/L   CO2 23 22 - 32 mmol/L   Glucose, Bld 185 (H) 70 - 99 mg/dL    Comment: Glucose reference range applies only to samples taken after fasting for at least 8 hours.   BUN 34 (H) 8 - 23 mg/dL  Creatinine, Ser 0.95 0.44 - 1.00 mg/dL   Calcium 9.9 8.9 - 16.1 mg/dL   Total Protein 6.9 6.5 - 8.1 g/dL   Albumin 2.7 (L) 3.5 - 5.0 g/dL   AST 29 15 - 41 U/L   ALT 74 (H) 0 - 44 U/L   Alkaline Phosphatase 343 (H) 38 - 126 U/L   Total Bilirubin 0.9 0.3 - 1.2 mg/dL   GFR calc non Af Amer >60 >60 mL/min   GFR calc Af Amer >60 >60 mL/min   Anion gap 11 5 - 15    Comment: Performed at [facility], [address] [place], [place] [address]  Glucose, capillary     Status: Abnormal   Collection Time: 06/20/20  8:51 AM  Result Value Ref Range   Glucose-Capillary 175 (H) 70 - 99 mg/dL    Comment: Glucose reference range applies only to samples taken  after fasting for at least 8 hours.  Glucose, capillary     Status: Abnormal   Collection Time: 06/20/20 11:22 AM  Result Value Ref Range   Glucose-Capillary 202 (H) 70 - 99 mg/dL    Comment: Glucose reference range applies only to samples taken after fasting for at least 8 hours.  Glucose, capillary     Status: Abnormal   Collection Time: 06/20/20  4:27 PM  Result Value Ref Range   Glucose-Capillary 226 (H) 70 - 99 mg/dL    Comment: Glucose reference range applies only to samples taken after fasting for at least 8 hours.  Glucose, capillary     Status: Abnormal   Collection Time: 06/20/20  8:33 PM  Result Value Ref Range   Glucose-Capillary 296 (H) 70 - 99 mg/dL    Comment: Glucose reference range applies only to samples taken after fasting for at least 8 hours.  Glucose, capillary     Status: Abnormal   Collection Time: 06/20/20 11:57 PM  Result Value Ref Range   Glucose-Capillary 281 (H) 70 - 99 mg/dL    Comment: Glucose reference range applies only to samples taken after fasting for at least 8 hours.  Glucose, capillary     Status: Abnormal   Collection Time: 06/21/20  4:56 AM  Result Value Ref Range   Glucose-Capillary 290 (H) 70 - 99 mg/dL    Comment: Glucose reference range applies only to samples taken after fasting for at least 8 hours.  Ferritin     Status: None   Collection Time: 06/21/20  5:30 AM  Result Value Ref Range   Ferritin 251 11 - 307 ng/mL    Comment: Performed at [facility], [address] [place], [place] [address]  Fibrin derivatives D-Dimer [facility] only)     Status: None   Collection Time: 06/21/20  5:30 AM  Result Value Ref Range   Fibrin derivatives D-dimer ([facility]) 317.23 0.00 - 499.00 ng/mL (FEU)    Comment: (NOTE) <> Exclusion of Venous Thromboembolism (VTE) - OUTPATIENT ONLY   (Emergency Department or [place])    0-499 ng/ml (FEU): With a low to intermediate pretest probability                      for VTE this test result excludes the  diagnosis                      of VTE.   >499 ng/ml (FEU) : VTE not excluded; additional work up for VTE is  required.  <> Testing on Inpatients and Evaluation of Disseminated Intravascular   Coagulation (DIC) Reference Range:   0-499 ng/ml (FEU) Performed at [facility], [address] [place], [place] [address]   C-reactive protein     Status: Abnormal   Collection Time: 06/21/20  5:30 AM  Result Value Ref Range   CRP 2.6 (H) <1.0 mg/dL    Comment: Performed at [facility], [address] [place], [place] [address]  Lactate dehydrogenase     Status: Abnormal   Collection Time: 06/21/20  5:30 AM  Result Value Ref Range   LDH 299 (H) 98 - 192 U/L    Comment: Performed at [facility], [address] [place], [place] [address]  CBC with Differential/Platelet     Status: Abnormal   Collection Time: 06/21/20  5:30 AM  Result Value Ref Range   WBC 10.6 (H) 4.0 - 10.5 K/uL   RBC 5.25 (H) 3.87 - 5.11 MIL/uL   Hemoglobin 14.5 12.0 - 15.0 g/dL   HCT 11.9 36 - 46 %   MCV 82.7 80.0 - 100.0 fL   MCH 27.6 26.0 - 34.0 pg   MCHC 33.4 30.0 - 36.0 g/dL   RDW 41.7 40.8 - 14.4 %   Platelets 313 150 - 400 K/uL   nRBC 0.0 0.0 - 0.2 %   Neutrophils Relative % 74 %   Neutro Abs 7.9 (H) 1.7 - 7.7 K/uL   Lymphocytes Relative 8 %   Lymphs Abs 0.8 0.7 - 4.0 K/uL   Monocytes Relative 13 %   Monocytes Absolute 1.3 (H) 0 - 1 K/uL   Eosinophils Relative 0 %   Eosinophils Absolute 0.0 0 - 0 K/uL   Basophils Relative 0 %   Basophils Absolute 0.0 0 - 0 K/uL   Immature Granulocytes 5 %   Abs Immature Granulocytes 0.50 (H) 0.00 - 0.07 K/uL    Comment: Performed at [facility], [address], [place], [place] [address]  Comprehensive metabolic panel     Status: Abnormal   Collection Time: 06/21/20  5:30 AM  Result Value Ref Range   Sodium 137 135 - 145 mmol/L   Potassium 4.2 3.5 - 5.1 mmol/L   Chloride 102 98 - 111 mmol/L   CO2 24 22 - 32  mmol/L   Glucose, Bld 307 (H) 70 - 99 mg/dL    Comment: Glucose reference range applies only to samples taken after fasting for at least 8 hours.   BUN 36 (H) 8 - 23 mg/dL   Creatinine, Ser 3.14 0.44 - 1.00 mg/dL   Calcium 9.7 8.9 - 97.0 mg/dL   Total Protein 6.5 6.5 - 8.1 g/dL   Albumin 2.6 (L) 3.5 - 5.0 g/dL   AST 24 15 - 41 U/L   ALT 61 (H) 0 - 44 U/L   Alkaline Phosphatase 298 (H) 38 - 126 U/L   Total Bilirubin 0.7 0.3 - 1.2 mg/dL   GFR calc non Af Amer >60 >60 mL/min   GFR calc Af Amer >60 >60 mL/min   Anion gap 11 5 - 15    Comment: Performed at [facility], [address] [place], [place] [address]  Glucose, capillary     Status: Abnormal   Collection Time: 06/21/20  8:39 AM  Result Value Ref Range   Glucose-Capillary 256 (H) 70 - 99 mg/dL    Comment: Glucose reference range applies only to samples taken after fasting for at least  8 hours.  Glucose, capillary     Status: Abnormal   Collection Time: 06/21/20 12:19 PM  Result Value Ref Range   Glucose-Capillary 234 (H) 70 - 99 mg/dL    Comment: Glucose reference range applies only to samples taken after fasting for at least 8 hours.  Glucose, capillary     Status: Abnormal   Collection Time: 06/21/20  5:20 PM  Result Value Ref Range   Glucose-Capillary 129 (H) 70 - 99 mg/dL    Comment: Glucose reference range applies only to samples taken after fasting for at least 8 hours.    Imaging CT Head Wo Contrast  Result Date: 06/12/2020 CLINICAL DATA:  Mental status change. EXAM: CT HEAD WITHOUT CONTRAST TECHNIQUE: Contiguous axial images were obtained from the base of the skull through the vertex without intravenous contrast. COMPARISON:  None. FINDINGS: Brain: No evidence of acute infarction, hemorrhage, hydrocephalus, extra-axial collection or mass lesion/mass effect. Vascular: Atherosclerotic calcifications of the intra cavernous carotid arteries. Skull: Normal. Negative for fracture or focal lesion. Sinuses/Orbits: No  acute finding. Other: None. IMPRESSION: No acute intracranial abnormality. Electronically Signed   By: Ted Mcalpine M.D.   On: 06/12/2020 17:55   CT ANGIO CHEST PE W OR WO CONTRAST  Result Date: 06/21/2020 CLINICAL DATA:  Respiratory failure.  COVID-19 positive EXAM: CT ANGIOGRAPHY CHEST WITH CONTRAST TECHNIQUE: Multidetector CT imaging of the chest was performed using the standard protocol during bolus administration of intravenous contrast. Multiplanar CT image reconstructions and MIPs were obtained to evaluate the vascular anatomy. CONTRAST:  75mL OMNIPAQUE IOHEXOL 350 MG/ML SOLN COMPARISON:  Chest radiograph June 17, 2020 FINDINGS: Cardiovascular: There is no demonstrable pulmonary embolus. There is no appreciable thoracic aortic aneurysm or dissection. A small focus of calcification is noted in the left subclavian artery. There is also calcification in the proximal right subclavian artery. Note that the right innominate and left common carotid arteries arise as a common trunk, an anatomic variant. There are foci of aortic atherosclerosis. There are foci of coronary artery calcification. There is no pericardial effusion or pericardial thickening. Central catheter tip is in the distal superior vena cava near the cavoatrial junction. Mediastinum/Nodes: There is a mass arising from the right lobe of the thyroid measuring 2.9 x 2.8 cm. There is no appreciable thoracic adenopathy. No esophageal lesions are evident. Lungs/Pleura: There is extensive multifocal airspace opacity throughout the lungs diffusely. There are foci of consolidation in each lung base as well as in a portion of the posterior segment right upper lobe. No pleural effusions are appreciable. Upper Abdomen: Visualized upper abdominal structures appear unremarkable. Musculoskeletal: Degenerative changes noted in the thoracic spine. There is lower thoracic dextroscoliosis. There are no blastic or lytic bone lesions. No evident chest wall  lesions. Review of the MIP images confirms the above findings. IMPRESSION: 1. No demonstrable pulmonary embolus. No thoracic aortic aneurysm or dissection. There is aortic atherosclerosis as well as foci of great vessel and coronary artery calcification. 2. Multifocal pneumonia. Suspect atypical organism pneumonia. Areas of consolidation also noted in the lung bases and right upper lobe posteriorly. There may be a degree of bacterial superinfection in these areas. 3.  No adenopathy appreciable. 4. 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 Recommend thyroid US per consensus guidelines. (Ref: J Am Coll Radiol. 2015 Feb;12(2): 143-50) Aortic Atherosclerosis (ICD10-I70.0). Electronically Signed   By: Bretta Bang III M.D.   On: 06/21/2020 17:32   US PELVIS (TRANSABDOMINAL ONLY)  Result Date: 06/21/2020 CLINICAL DATA:  Vaginal bleeding EXAM: TRANSABDOMINAL ULTRASOUND OF PELVIS TECHNIQUE: Transabdominal ultrasound examination of the pelvis was performed including evaluation of the uterus, ovaries, adnexal regions, and pelvic cul-de-sac. COMPARISON:  None. FINDINGS: Uterus Measurements: 9.7 x 4.8 x 5.0 cm = volume: 123 mL. The uterus has a diffusely inhomogeneous appearance consistent with extensive leiomyomatous change. The largest leiomyoma is in the leftward aspect of the uterus, partially calcified, measuring 3.5 x 3.5 x 2.8 cm. Scattered smaller calcifications noted elsewhere. Endometrium Thickness: 7 mm.  No focal abnormality visualized. Right ovary Measurements: 1.8 x 0.9 x 1.5 cm = volume: 1.3 mL. Normal appearance/no adnexal mass. Left ovary Left ovary could not be visualized. No left-sided pelvic mass. Other findings: No abnormal free fluid. IMPRESSION: 1. Leiomyomatous uterus. Largest leiomyoma is largely calcified, measuring 3.5 x 3.5 x 2.8 cm. No endometrial thickening evident. 2. Right ovary appears normal for postmenopausal state. Left ovary cannot be  visualized. No left-sided pelvic mass evident. No free pelvic fluid. Electronically Signed   By: Bretta Bang III M.D.   On: 06/21/2020 14:57   DG Chest Port 1 View  Result Date: 06/17/2020 CLINICAL DATA:  Respiratory failure with hypoxia, acute hypoxemic respiratory failure secondary to COVID-19 EXAM: PORTABLE CHEST 1 VIEW COMPARISON:  Portable exam 0809 hours compared to 06/12/2020 FINDINGS: Normal heart size, mediastinal contours, and pulmonary vascularity. Patchy airspace infiltrates bilaterally consistent with multifocal pneumonia and COVID-19. Infiltrates have increased since previous exam. No pleural effusion or pneumothorax. Scoliosis and degenerative disc disease changes of thoracic spine with additional BILATERAL glenohumeral degenerative changes. IMPRESSION: Increased patchy BILATERAL pulmonary infiltrates consistent with multifocal pneumonia and COVID-19 infection. Electronically Signed   By: Ulyses Southward M.D.   On: 06/17/2020 08:28   DG Chest Portable 1 View  Result Date: 06/12/2020 CLINICAL DATA:  Tachypnea and hypoxia.  Hyperglycemia. EXAM: PORTABLE CHEST 1 VIEW COMPARISON:  10/29/2018 FINDINGS: Low lung volumes are present, causing crowding of the pulmonary vasculature. Chronic elevation of left hemidiaphragm. Patchy abnormal airspace opacities particularly in the right mid lung and left lung base suspicious for bilateral pneumonia. Dextroconvex thoracic scoliosis. Heart size within normal limits given the projection. IMPRESSION: 1. Patchy abnormal airspace opacities in the right mid lung and left lung base suspicious for bilateral pneumonia. 2. Low lung volumes. 3. Chronic elevation of the left hemidiaphragm. 4. Dextroconvex thoracic scoliosis. Electronically Signed   By: Gaylyn Rong M.D.   On: 06/12/2020 16:04   ECHOCARDIOGRAM COMPLETE  Result Date: 06/21/2020    ECHOCARDIOGRAM REPORT   Patient Name:   Chelsea Glass Date of Exam: 06/21/2020 Medical Rec #:  [id number]          Height:       66.0 in Accession #:    [id number]        Weight:       272.9 lb Date of Birth:  1956-04-14        BSA:          2.282 m Patient Age:    63 years          BP:           140/86 mmHg Patient Gender: F                 HR:           91 bpm. Exam Location:  [facility] Procedure: 2D Echo, Cardiac Doppler and Color Doppler Indications:     Chest pain 786.50  History:  Patient has no prior history of Echocardiogram examinations.                  Risk Factors:Hypertension and Diabetes.  Sonographer:     Cristela Blue RDCS (AE) Referring Phys:  [id number] Eliezer Mccoy PATEL Diagnosing Phys: Yvonne Kendall MD  Sonographer Comments: Technically difficult study due to poor echo windows, no apical window and no subcostal window. IMPRESSIONS  1. Left ventricular ejection fraction, by estimation, is > 55%. The left ventricle has normal function. Left ventricular endocardial border not optimally defined to evaluate regional wall motion. There is moderate left ventricular hypertrophy. Left ventricular diastolic function could not be evaluated.  2. Right ventricular systolic function was not well visualized. The right ventricular size is not well visualized. Tricuspid regurgitation signal is inadequate for assessing PA pressure.  3. The mitral valve was not well visualized.  4. The aortic valve was not well visualized. FINDINGS  Left Ventricle: Left ventricular ejection fraction, by estimation, is > 55%. The left ventricle has normal function. Left ventricular endocardial border not optimally defined to evaluate regional wall motion. The left ventricular internal cavity size was normal in size. There is moderate left ventricular hypertrophy. Left ventricular diastolic function could not be evaluated. Right Ventricle: The right ventricular size is not well visualized. No increase in right ventricular wall thickness. Right ventricular systolic function was not well visualized. Tricuspid regurgitation signal is inadequate for  assessing PA pressure. Left Atrium: Left atrial size was not well visualized. Right Atrium: Right atrial size was not well visualized. Pericardium: The pericardium was not well visualized. Mitral Valve: The mitral valve was not well visualized. Tricuspid Valve: The tricuspid valve is not well visualized. Tricuspid valve regurgitation is trivial. Aortic Valve: The aortic valve was not well visualized. Pulmonic Valve: The pulmonic valve was not well visualized. Aorta: The aortic root is normal in size and structure. Pulmonary Artery: The pulmonary artery is not well seen. Venous: The inferior vena cava was not well visualized. IAS/Shunts: The interatrial septum was not well visualized.  LEFT VENTRICLE PLAX 2D LVIDd:         3.09 cm LVIDs:         2.22 cm LV PW:         1.48 cm LV IVS:        1.46 cm LVOT diam:     2.20 cm LVOT Area:     3.80 cm  LEFT ATRIUM         Index LA diam:    3.20 cm 1.40 cm/m   AORTA Ao Root diam: 2.80 cm  SHUNTS Systemic Diam: 2.20 cm Yvonne Kendall MD Electronically signed by Yvonne Kendall MD Signature Date/Time: 06/21/2020/4:24:57 PM    Final    [place] EKG SITE RITE  Result Date: 06/19/2020 If Site Rite image not attached, placement could not be confirmed due to current cardiac rhythm.  [place] EKG SITE RITE  Result Date: 06/19/2020 If Site Rite image not attached, placement could not be confirmed due to current cardiac rhythm.   Assessment: 64 y.o. No obstetric history on file.  Plan:  1) Postmenopausal bleeding - We discussed that menopause is a clinical diagnosis made after 12 months of amenorrhea.  The average age of menopause in the  [place] population is 31 but there may be significant variation.  Any bleeding that happens after a 12 month period of amenorrhea warrants further work.  Possible etiologies of postmenopausal bleeding were discussed with the patient today.  These  may range from benign etiologies such as urethral prolapse and atrophy, to indeterminate lesions  such as submucosal fibroids or polyps which would require resection to accurately evaluate. The role of unopposed estrogen in the development of endometrial hyperplasia or carcinoma is discussed.  The risk of endometrial hyperplasia is linearly correlated with increasing BMI given the production of estrone by adipose tissue.  Work generally includes transvaginal ultrasound to assess the thickness of the endometrial lining as well as to assess for focal uterine lesions.  Negative ultrasound evaluation, defined as the absence of focal lesions and endometrial stripe of <29mm, effectively rules out carcinoma and confirms atrophy as the most likely etiology.  Should focal lesions be present these generally require hysteroscopic resection.  Should lining be greater >78mm endometrial biopsy is warranted to rule out hyperplasia or frank endometrial cancer.  Continued episodes of bleeding despite negative ultrasound also warrant endometrial sampling.  As the cervical pathology may also be implicated in postmenopausal bleeding prior cervical cytology was reviewed and repeated if required per ASCCP guidelines.   - given that the patient's ultrasound obtained this admission shows an endometrial stripe of over 4mm endometrial sampling is warranted in the outpatient setting.  The overall concern for malignancy remains relatively low given the fact that her endometrial stripe measurement is relatively stable from her prior evaluations which were negative. - pap 12/13/2016 NILM unknown HPV status, will also repeat pap   2) 40 minutes were spent taking care of this patient, reviewing labs, imaging, and prior pathology.    Vena Austria, MD, [facility], [facility] 06/21/2020, 6:21 PM

## 2020-06-21 NOTE — Progress Notes (Signed)
PROGRESS NOTE    Chelsea Glass  HAL:[id number] DOB: 01/08/1956 DOA: 06/12/2020 PCP: [facility] [place]    Brief Narrative:  Brought to the hospital because of altered mental status. History was obtained from ED physician and chart review. Patient is unable to provide an adequate history. Reportedly, EMS was called because patient had been laying in bed for about 5 days without getting up. Apparently, she had been laying in herfeces and urine. Per report, EMS had been called to the house about 4 days prior to admission but patient had refused transport to the hospital at that time.Brought to the hospital because of altered mental status. History was obtained from ED physician and chart review. Patient is unable to provide an adequate history. Reportedly, EMS was called because patient had been laying in bed for about 5 days without getting up. Apparently, she had been laying in herfeces and urine. Per report, EMS had been called to the house about 4 days prior to admission but patient had refused transport to the hospital at that time.  Assessment & Plan:   Active Problems:   Pneumonia due to COVID-19 virus   Acute respiratory failure with hypoxia (HCC)   Obesity, Class III, BMI 40-49.9 (morbid obesity) (HCC)   Severe sepsis ([place])   New onset atrial fibrillation (HCC)   Hypertension Severe sepsis- resolved.  -On admission patient met criteria for severe sepsis HR> 90,RR> 20, respiratory distress, lactic acid> 2 -Lactic acidosis has resolved post fluid resuscitation -Normal saline 31m/hr -Lactic acidosis resolved  Acute respiratory failure with hypoxia/Covid pneumonia COVID-19 Labs  Recent Labs    06/19/20 0412 06/20/20 0601 06/21/20 0530  FERRITIN 186 227 251  LDH 348* 323* 299*  CRP 4.5* 5.8* 2.6*    -Solu-Medrol 60 mg BID -Remdesivir per pharmacy protocol. --Anticoagulation with Lovenox 62.5 mg subcu every 24. -Vitamin C and zinc per Covid  protocol -Incentive spirometry -Flutter valve  -Respimat QID 9/8: pt is sating in high 80's no distress / alert and awake but slow to answer seem to perseverate with answers.  9/9 SpO2: 95 % O2 Flow Rate (L/min): 11 L/min FiO2 (%): 65 % Dw/ daughter about moms intermittent hypoxia and D/W RT and nurse about plan for pt to keep her oxygen on but HFNC, d/w RT and Dr.Kasa about plan for possible bipap and guarded condition in case she needs to be transferred to ICU.   -9/10: SpO2: 95 % O2 Flow Rate (L/min): 11 L/min FiO2 (%): 65 % Pt seen today she is in no distress and oxygenating better with 15 L HFNC. spoke to daughter about starting baricitnab and she declined. Remdesivir  Therapy completed yesterday.  Pt also started on CAP coverage due to progressive infiltrates on chest xray.  covid labs pending. Currently pt stable on HFNC.  -9/11 SpO2: 95 % O2 Flow Rate (L/min): 11 L/min FiO2 (%): 65 % Pt is stable on bipap with o2 sats in high 90's.  Continue steroid therapy. D/W intensivist about pt's mental status and  Attribute to covid-19 encephalopathy. We will continue BIpap and monitor o2 saturation, with repeat ABG.   9/12 Vitals stable afebrile overnight, pt has responded well clinically with bipap we will make her start using intermittent bipap with HFNC an hour before and an hour after small meals And resuming bipap therapy. CRP going up and ferritin not resulted.   9/13 Vital show elevated bp as pt is not taking po she is confused  We will start pt on hydralazine  and change to bipap and start ivf as she is not eating or taking by mouth.  Called daughter today about moms condition and due to comorbidity she has guarded prognosis.  She has declined baricitinab and is cont to received steroids and supplemental oxygen.  SpO2: 95 % O2 Flow Rate (L/min): 11 L/min FiO2 (%): 65 % COVID-19 Labs  Recent Labs    06/19/20 0412 06/20/20 0601 06/21/20 0530  FERRITIN 186 227 251   LDH 348* 323* 299*  CRP 4.5* 5.8* 2.6*    Lab Results  Component Value Date   SARSCOV2NAA POSITIVE (A) 06/12/2020   [place] NEGATIVE 07/31/2019   9/14 Pt today is alert and awake, the bipap is helping her oxygenation and therefore her confusion. She is appropriate and follows commands is  Crying and spoke to her daughter delores immediately to give her good news today. Pt states she is having chest pain but is ok otherwise.Pt also spoke to her daughter on phone in room with me and simone for about 10 minutes.Pt has been on bipap and d/w nurse about plan for cont bipap at night and at day when her o2 sats drop below 90%. Later in the morning received message from nurse about pt having vaginal bleeding. We will do pelvic TV usg and seek gyn consult and evaluate.   Chest pain: D/d include pe / myocarditis etc we will do echo and follow.  We will also do stat cta for identifying PE.    Vaginal bleeding: TV pelvic usg and gyn consult.   Diabetes type 2 uncontrolled with hyperglycemia -Lantus 20 units BID -NovoLog 8 units qac -Resistant SSI -9/6 Hemoglobin A1c= 8.2 -Consult diabetic coordinator; obese uncontrolled diabetic requires teaching -Consult diabetic nutritionist obese uncontrolled diabetic requires teaching -- NO change cont ssi/ and accuchecks. --will hold lantus and premeal insulin as pt is on bipap continuously.  -will hold insulin as pt is poor historian and currently is unable to obtain.  -cont basal insulin until pt is back to baseline with diet and activity.   HLD -9/6 LDL= 234 -As soon as patient completes treatment for Covid would start patient on Lipitor 40 mg daily.  Essential HTN -Coreg 12.5 mg BID -Cardizem drip (discontinued), patient now in NSR --Patient continued on Coreg as mentioned above, home regimen of chlorthalidone and Aldactone have been discontinued. --will restart diuretic therapy with hctz at 12.5 mg as well and uptitrate.  --increased  hctz to bid.  -cont with prn hydralazine.  New onset A. fib -See HTN --continue coreg pt in sinus rhythm now.   Hypokalemia -Potassium goal> 4 --will recheck and follow.   Hypomagnesmia -Magnesium goal> 2 - will recheck and follow.   Hypophosphatemia -Phosphorus goal> 2.5 We will follow levels.   Elevated liver enzymes -We will need to monitor closely but appear to be only slightly elevated and stable --current ast/alt of 43 and 81 and is improving.  Currently improving 37/ 74. --AST is WNL and alt is higher than before.  -- Ast of 29 and ALT of 74 it is lower than yesterday.    ~~~~~~~~~~~~~~~~~~~~~~~~~~~~~~~~~~~~~~~~~~~~~~~~~~~~~~~~~~~~~~~  DVT prophylaxis: Lovenox.  Code Status: Full Family Communication: delores daughter spoke to her about moms guarded prognosis.  Disposition Plan: d/c to STR in one week .  Status is: Inpatient Remains inpatient appropriate because:Inpatient level of care appropriate due to severity of illness  Dispo: The patient is from: Home              Anticipated d/c  is to: Home vs STR              Anticipated d/c date is:3 days              Patient currently is not medically stable to d/c.   ~~~~~~~~~~~~~~~~~~~~~~~~~~~~~~~~~~~~~~~~~~~~~~~~~~~~~~~~~~~~~~~  Subjective:  9/7 afebrile overnight A/O x4, positive S OB, negative CP, negative abdominal pain, negative nausea, negative vomiting, negative diarrhea.  9/8 Pt seen today she is alert and answers questions appropriately.  Pt's oxygen has been in high 80's and 90's pt advised for proning and her sats came up. We will also get PT on board.  9/9 Pt is alert and awake but appears confused intermittently and slow to answer.  D/W daughter about mom today and we will try to get her oxygen level up and if needed intubate. Spoke to nick RT and pt is not in distress and follows commands. Pt denies any complaints although  It is difficult to assess if she has poor insight , she is confused  from covid-19 infection.   9/10 Pt seen today she is again awake and alert but confused somewhat. She was pulling out her oxygen last night and has soft mitten placed. Pt is hypoxic on ABG today d/w nurse about HFNC and d/w intensivist and mid 41 sat are goal. Labs show improving lft. Repeat chest xray shows increasing bl infiltrates and spoke to daughter about baricitnab and risk and benefit and she declined  T/t with it. BP is high we will have a manual repeat and prn hydralazine in case it is still high.hbis stable and kidney function is improving.   COVID-19 Labs  Recent Labs    06/19/20 0412 06/20/20 0601 06/21/20 0530  FERRITIN 186 227 251  LDH 348* 323* 299*  CRP 4.5* 5.8* 2.6*    Lab Results  Component Value Date   SARSCOV2NAA POSITIVE (A) 06/12/2020   [place] NEGATIVE 07/31/2019   9/11 Pt seen today and she is still confused ,but  Awake and follows commands but slow to respond. D/w daughter delrois who does not want mom to received baricitinab, I readdressed the baricitnab today , told daughter that there has  been positive response in many of my patients but she wants to not start it.  Currently all labs are pending.   9/12 Pt on cont bipap for past 24 hours and is more alert per report. COVID-19 Labs  Recent Labs    06/19/20 0412 06/20/20 0601 06/21/20 0530  FERRITIN 186 227 251  LDH 348* 323* 299*  CRP 4.5* 5.8* 2.6*    Lab Results  Component Value Date   SARSCOV2NAA POSITIVE (A) 06/12/2020   [place] NEGATIVE 07/31/2019   9/13  SpO2: 95 % O2 Flow Rate (L/min): 11 L/min FiO2 (%): 65 % COVID-19 Labs  Recent Labs    06/19/20 0412 06/20/20 0601 06/21/20 0530  FERRITIN 186 227 251  LDH 348* 323* 299*  CRP 4.5* 5.8* 2.6*    Lab Results  Component Value Date   SARSCOV2NAA POSITIVE (A) 06/12/2020   [place] NEGATIVE 07/31/2019     9/14 Pt has improved to day and she was very happy and spoke to her daughter.  Today pt reports chest  pain. But is a poor historian.   Objective: Vitals:   06/21/20 0459 06/21/20 0731 06/21/20 1100 06/21/20 1127  BP: (!) 179/97 (!) 151/82  140/86  Pulse: 91     Resp:  20    Temp: 97.8 F (36.6 C) 97.7 F (  36.5 C)    TempSrc: Oral Axillary    SpO2: 100%  95%   Weight: 123.8 kg     Height:        Intake/Output Summary (Last 24 hours) at 06/21/2020 1350 Last data filed at 06/21/2020 0500 Gross per 24 hour  Intake --  Output 450 ml  Net -450 ml   Filed Weights   06/16/20 0253 06/19/20 0440 06/21/20 0459  Weight: 126.6 kg 126.6 kg 123.8 kg   Examination: Blood pressure 140/86, pulse 91, temperature 97.7 F (36.5 C), temperature source Axillary, resp. rate 20, height 5' 6"  (1.676 m), weight 123.8 kg, SpO2 95 %.  General exam: Appears calm and comfortable  Respiratory system: BL Rhonchi in both lungs posteriorly.  Cardiovascular system: S1 & S2 heard, RRR. No JVD, murmurs, rubs, gallops or clicks. No pedal edema. Gastrointestinal system: Abdomen is nondistended, soft and nontender. No organomegaly or masses felt. Normal bowel sounds heard. Central nervous system: Alert and oriented. No focal neurological deficits. Extremities:CN are grossly intact. Pt moving all ext.  Skin: No rashes, lesions or ulcers Psychiatry: Judgement and insight appear normal. Mood & affect appropriate.   Data Reviewed:  CBC: Recent Labs  Lab 06/16/20 0658 06/17/20 0502 06/19/20 0412 06/20/20 0601 06/21/20 0530  WBC 11.2* 14.2* 16.5* 12.9* 10.6*  NEUTROABS 9.1* 11.4* 12.9* 9.8* 7.9*  HGB 14.3 15.5* 15.2* 15.2* 14.5  HCT 42.2 46.5* 45.8 43.9 43.4  MCV 81.2 81.7 82.1 80.0 82.7  PLT 204 257 349 347 633   Basic Metabolic Panel: Recent Labs  Lab 06/15/20 0404 06/15/20 0404 06/16/20 0658 06/17/20 0502 06/17/20 1014 06/19/20 0412 06/20/20 0601 06/21/20 0530  NA 138   < > 139 141  --  141 139 137  K 3.7   < > 4.4 4.6  --  4.2 4.3 4.2  CL 104   < > 104 107  --  107 105 102  CO2 23   < > 25  23  --  25 23 24   GLUCOSE 122*   < > 127* 99  --  168* 185* 307*  BUN 41*   < > 44* 40*  --  33* 34* 36*  CREATININE 1.14*   < > 1.10* 1.10*  --  0.85 0.95 0.88  CALCIUM 9.4   < > 9.4 9.4  --  10.0 9.9 9.7  MG 2.5*  --  2.5* 2.6* 2.6* 2.3  --   --   PHOS 4.2  --  4.5 3.2  --  2.7  --   --    < > = values in this interval not displayed.   GFR: Estimated Creatinine Clearance: 85.6 mL/min (by C-G formula based on SCr of 0.88 mg/dL). Liver Function Tests: Recent Labs  Lab 06/16/20 0658 06/17/20 0502 06/19/20 0412 06/20/20 0601 06/21/20 0530  AST 43* 37 40 29 24  ALT 81* 74* 92* 74* 61*  ALKPHOS 411* 399* 412* 343* 298*  BILITOT 1.0 1.0 0.8 0.9 0.7  PROT 6.3* 6.4* 6.6 6.9 6.5  ALBUMIN 2.6* 2.7* 2.8* 2.7* 2.6*    No results for input(s): HGBA1C in the last 72 hours. CBG: Recent Labs  Lab 06/20/20 2033 06/20/20 2357 06/21/20 0456 06/21/20 0839 06/21/20 1219  GLUCAP 296* 281* 290* 256* 234*   Anemia Panel: Recent Labs    06/20/20 0601 06/21/20 0530  FERRITIN 227 251   Sepsis Labs: Recent Labs  Lab 06/15/20 0404  PROCALCITON 0.12    Recent Results (from the past 240  hour(s))  SARS Coronavirus 2 by RT PCR (hospital order, performed in [facility] hospital lab) Nasopharyngeal Nasopharyngeal Swab     Status: Abnormal   Collection Time: 06/12/20  3:21 PM   Specimen: Nasopharyngeal Swab  Result Value Ref Range Status   SARS Coronavirus 2 POSITIVE (A) NEGATIVE Final    Comment: RESULT CALLED TO, READ BACK BY AND VERIFIED WITH: MEGAN JONES AT 1634 06/12/20.PMF (NOTE) SARS-CoV-2 target nucleic acids are DETECTED  SARS-CoV-2 RNA is generally detectable in upper respiratory specimens  during the acute phase of infection.  Positive results are indicative  of the presence of the identified virus, but do not rule out bacterial infection or co-infection with other pathogens not detected by the test.  Clinical correlation with patient history and  other diagnostic  information is necessary to determine patient infection status.  The expected result is negative.  Fact Sheet for Patients:   StrictlyIdeas.no   Fact Sheet for Healthcare Providers:   BankingDealers.co.za    This test is not yet approved or cleared by the [place] FDA and  has been authorized for detection and/or diagnosis of SARS-CoV-2 by FDA under an Emergency Use Authorization (EUA).  This EUA will remain in effect (meaning this test c an be used) for the duration of  the COVID-19 declaration under Section 564(b)(1) of the Act, 21 U.S.C. section 360-bbb-3(b)(1), unless the authorization is terminated or revoked sooner.  Performed at [facility], [address] [place], [place] [address]   Blood culture (routine x 2)     Status: None   Collection Time: 06/12/20  3:21 PM   Specimen: BLOOD  Result Value Ref Range Status   Specimen Description BLOOD RIGHT FOREARM  Final   Special Requests   Final    BOTTLES DRAWN AEROBIC AND ANAEROBIC Blood Culture adequate volume   Culture   Final    NO GROWTH 5 DAYS Performed at [facility], [address] [place], [place] [address]    Report Status 06/17/2020 FINAL  Final  Blood culture (routine x 2)     Status: None   Collection Time: 06/12/20  3:21 PM   Specimen: BLOOD  Result Value Ref Range Status   Specimen Description BLOOD LEFT HAND  Final   Special Requests   Final    BOTTLES DRAWN AEROBIC AND ANAEROBIC Blood Culture adequate volume   Culture   Final    NO GROWTH 5 DAYS Performed at [facility], [address], [place], [place] [address]    Report Status 06/17/2020 FINAL  Final     Scheduled Meds: . vitamin C  500 mg Oral Daily  . carvedilol  12.5 mg Oral BID WC  . Chlorhexidine Gluconate Cloth  6 each Topical Daily  . enoxaparin (LOVENOX) injection  0.5 mg/kg Subcutaneous Q24H  . feeding supplement (NEPRO CARB STEADY)  237 mL Oral TID BM   . hydrochlorothiazide  12.5 mg Oral BID  . insulin aspart  0-15 Units Subcutaneous TID WC  . insulin aspart  0-5 Units Subcutaneous QHS  . insulin glargine  10 Units Subcutaneous QHS  . Ipratropium-Albuterol  1 puff Inhalation Q6H  . melatonin  2.5 mg Oral QHS  . methylPREDNISolone (SOLU-MEDROL) injection  60 mg Intravenous Q12H  . multivitamin with minerals  1 tablet Oral Daily  . polyethylene glycol  17 g Oral Once  . sodium chloride flush  10-40 mL Intracatheter Q12H  . zinc sulfate  220 mg Oral Daily  Continuous Infusions: . cefTRIAXone (ROCEPHIN)  IV 2 g (06/21/20 1000)  . dextrose 5 % and 0.45% NaCl 75 mL/hr at 06/21/20 0028     LOS: 9 days    Para Skeans, MD [facility] Pager [phone] If 7PM-7AM, please contact night-coverage [email or web address] Password [email or web address] 06/21/2020, 1:50 PM

## 2020-06-21 NOTE — Progress Notes (Signed)
*  PRELIMINARY RESULTS* Echocardiogram 2D Echocardiogram has been performed.  Chelsea Glass 06/21/2020, 1:32 PM

## 2020-06-21 NOTE — Progress Notes (Signed)
Inpatient Diabetes Program Recommendations  AACE/ADA: New Consensus Statement on Inpatient Glycemic Control (2015)  Target Ranges:  Prepandial:   less than 140 mg/dL      Peak postprandial:   less than 180 mg/dL (1-2 hours)      Critically ill patients:  140 - 180 mg/dL   Lab Results  Component Value Date   GLUCAP 234 (H) 06/21/2020   HGBA1C 8.2 (H) 06/13/2020    Review of Glycemic Control Results for SIBLEY, ROLISON (MRN [id number]) as of 06/21/2020 13:22  Ref. Range 06/21/2020 04:56 06/21/2020 08:39 06/21/2020 12:19  Glucose-Capillary Latest Ref Range: 70 - 99 mg/dL 130 (H) 865 (H) 784 (H)   Diabetes history: DM 2 Outpatient Diabetes medications: Metformin XR 1000 mg daily Current orders for Inpatient glycemic control:  Novolog resistant q 4 hours  Inpatient Diabetes Program Recommendations:    Please consider adding Lantus 15 units daily. Note that PO diet restarted- Consider reducing Novolog correction to moderate tid with meals and HS.   Thanks  Beryl Meager, RN, BC-ADM Inpatient Diabetes Coordinator Pager [phone] (8a-5p)

## 2020-06-22 ENCOUNTER — Other Ambulatory Visit: Payer: Self-pay

## 2020-06-22 LAB — LACTATE DEHYDROGENASE: LDH: 291 U/L — ABNORMAL HIGH (ref 98–192)

## 2020-06-22 LAB — CBC WITH DIFFERENTIAL/PLATELET
Abs Immature Granulocytes: 0.45 10*3/uL — ABNORMAL HIGH (ref 0.00–0.07)
Basophils Absolute: 0 10*3/uL (ref 0.0–0.1)
Basophils Relative: 0 %
Eosinophils Absolute: 0 10*3/uL (ref 0.0–0.5)
Eosinophils Relative: 0 %
HCT: 42.9 % (ref 36.0–46.0)
Hemoglobin: 14 g/dL (ref 12.0–15.0)
Immature Granulocytes: 4 %
Lymphocytes Relative: 10 %
Lymphs Abs: 1 10*3/uL (ref 0.7–4.0)
MCH: 27.5 pg (ref 26.0–34.0)
MCHC: 32.6 g/dL (ref 30.0–36.0)
MCV: 84.3 fL (ref 80.0–100.0)
Monocytes Absolute: 1 10*3/uL (ref 0.1–1.0)
Monocytes Relative: 9 %
Neutro Abs: 7.9 10*3/uL — ABNORMAL HIGH (ref 1.7–7.7)
Neutrophils Relative %: 77 %
Platelets: 257 10*3/uL (ref 150–400)
RBC: 5.09 MIL/uL (ref 3.87–5.11)
RDW: 14.9 % (ref 11.5–15.5)
WBC: 10.3 10*3/uL (ref 4.0–10.5)
nRBC: 0 % (ref 0.0–0.2)

## 2020-06-22 LAB — COMPREHENSIVE METABOLIC PANEL
ALT: 64 U/L — ABNORMAL HIGH (ref 0–44)
AST: 34 U/L (ref 15–41)
Albumin: 2.6 g/dL — ABNORMAL LOW (ref 3.5–5.0)
Alkaline Phosphatase: 274 U/L — ABNORMAL HIGH (ref 38–126)
Anion gap: 10 (ref 5–15)
BUN: 38 mg/dL — ABNORMAL HIGH (ref 8–23)
CO2: 25 mmol/L (ref 22–32)
Calcium: 9.8 mg/dL (ref 8.9–10.3)
Chloride: 103 mmol/L (ref 98–111)
Creatinine, Ser: 1 mg/dL (ref 0.44–1.00)
GFR calc Af Amer: >60 mL/min (ref >60)
GFR calc non Af Amer: 59 mL/min — ABNORMAL LOW (ref >60)
Glucose, Bld: 173 mg/dL — ABNORMAL HIGH (ref 70–99)
Potassium: 4.4 mmol/L (ref 3.5–5.1)
Sodium: 138 mmol/L (ref 135–145)
Total Bilirubin: 0.7 mg/dL (ref 0.3–1.2)
Total Protein: 6.2 g/dL — ABNORMAL LOW (ref 6.5–8.1)

## 2020-06-22 LAB — GLUCOSE, CAPILLARY
Glucose-Capillary: 178 mg/dL — ABNORMAL HIGH (ref 70–99)
Glucose-Capillary: 202 mg/dL — ABNORMAL HIGH (ref 70–99)
Glucose-Capillary: 230 mg/dL — ABNORMAL HIGH (ref 70–99)
Glucose-Capillary: 203 mg/dL — ABNORMAL HIGH (ref 70–99)

## 2020-06-22 LAB — FERRITIN: Ferritin: 260 ng/mL (ref 11–307)

## 2020-06-22 LAB — C-REACTIVE PROTEIN: CRP: 1.2 mg/dL — ABNORMAL HIGH (ref <1.0)

## 2020-06-22 MED ORDER — INSULIN GLARGINE 100 UNIT/ML ~~LOC~~ SOLN
15.0000 [IU] | Freq: Every day | SUBCUTANEOUS | Status: DC
  Administered 2020-06-22: 15 [IU] via SUBCUTANEOUS
  Filled 2020-06-22 (×2): qty 0.15

## 2020-06-22 MED ORDER — BISACODYL 5 MG PO TBEC
5.0000 mg | DELAYED_RELEASE_TABLET | Freq: Once | ORAL | Status: AC
  Administered 2020-06-22: 5 mg via ORAL
  Filled 2020-06-22: qty 1

## 2020-06-22 MED ORDER — INFLUENZA VAC A&B SA ADJ QUAD 0.5 ML IM PRSY
0.5000 mL | PREFILLED_SYRINGE | INTRAMUSCULAR | Status: DC
  Filled 2020-06-22: qty 0.5

## 2020-06-22 MED ORDER — METOPROLOL TARTRATE 5 MG/5ML IV SOLN
10.0000 mg | INTRAVENOUS | Status: DC | PRN
  Administered 2020-06-22: 10 mg via INTRAVENOUS
  Filled 2020-06-22: qty 10

## 2020-06-22 NOTE — Evaluation (Signed)
Occupational Therapy Evaluation Patient Details Name: Chelsea Glass MRN: [id number] DOB: 1956/07/03 Today's Date: 06/22/2020    History of Present Illness Chelsea Glass is a 63yoF who comes to [facility] 9/5 c AMS, pt had been in bed for 5 days, found soiled. PMH: HTN, DM, OA, obesity.  Pt (+) c COVID, in AF upon arrival.   Clinical Impression   Pt was seen for OT evaluation this date. Prior to hospital admission, pt was Indep with self care ADLs and IADLs including driving and getting groceries and used no AD for fxl mobility although she was somewhat limited d/t arthritis. Pt lives with dtr in dtr's home that is [facility] with 3 STE. Currently pt demonstrates impairments as described below (See OT problem list) which functionally limit her ability to perform ADL/self-care tasks. Pt currently requires MIN A for bed level UB ADLs and MAX to TOTAL A with bed level LB ADLs with MIN/MOD cues for sequencing throughout d/t current cognitive status.  Pt able to follow commands, but demos moderate confusion throughout with initiating and seqencing tasks as well as general orientation. Pt would benefit from skilled OT services to address noted impairments and functional limitations (see below for any additional details) in order to maximize safety and independence while minimizing falls risk and caregiver burden. Upon hospital discharge, recommend STR to maximize pt safety and return to PLOF. Of note, HR became elevated during OT assessment while pt at rest/in bed (see below) so mobilization not performed/evaluation limited.     Follow Up Recommendations  SNF;Supervision/Assistance - 24 hour    Equipment Recommendations  Other (comment) (defer to next level of care)    Recommendations for Other Services       Precautions / Restrictions Precautions Precautions: Fall Precaution Comments: vaginal serosanguinous discharge Restrictions Weight Bearing Restrictions: No Other Position/Activity Restrictions:  monitor HR      Mobility Bed Mobility    General bed mobility comments: NT, pt's HR seemingly randomly increases to 140s during OT session while pt still resting in bed  Transfers         General transfer comment: NT    Balance                                           ADL either performed or assessed with clinical judgement   ADL Overall ADL's : Needs assistance/impaired Eating/Feeding: Set up;Cueing for sequencing Eating/Feeding Details (indicate cue type and reason): to self feed from BST at bed level with HOB elevated. Grooming: Wash/dry hands;Minimal assistance;Cueing for sequencing;Bed level Grooming Details (indicate cue type and reason): HOB elevated         Upper Body Dressing : Moderate assistance;Bed level   Lower Body Dressing: Maximal assistance;Total assistance;Bed level     Toilet Transfer Details (indicate cue type and reason): NT, pt HR elevated to 140s at rest while OT in room (RN notified)                 Vision Patient Visual Report: No change from baseline Additional Comments: difficult to formally assess d/t cognition, but pt tracks appropriate throughout session and with good visual attention     Perception     Praxis      Pertinent Vitals/Pain Pain Assessment: No/denies pain     Hand Dominance Right   Extremity/Trunk Assessment Upper Extremity Assessment Upper Extremity Assessment: Overall WFL for tasks assessed;Generalized weakness;Difficult  to assess due to impaired cognition (elbow, wrist and grip ROM WFL, shld flexion to 1/2 range. MMT shld 3-/5, elbow/wrist/grip grossly 4-/5)   Lower Extremity Assessment Lower Extremity Assessment: Defer to PT evaluation;Generalized weakness;Difficult to assess due to impaired cognition       Communication Communication Communication: No difficulties   Cognition Arousal/Alertness: Awake/alert   Overall Cognitive Status: Impaired/Different from baseline Area of  Impairment: Orientation;Attention;Memory;Following commands;Problem solving                 Orientation Level: Person ("[place]",  and eventually "hospital" when given options) Current Attention Level: Divided;Alternating Memory: Decreased recall of precautions;Decreased short-term memory Following Commands: Follows one step commands inconsistently;Follows one step commands with increased time     Problem Solving: Slow processing;Decreased initiation;Requires verbal cues;Requires tactile cues     General Comments       Exercises Other Exercises Other Exercises: OT facilitates ed re: role of OT, importance of even bed level activity to maintain strength/prevent atrophy. Pt with minimal understanding/carryover detected.   Shoulder Instructions      Home Living Family/patient expects to be discharged to:: Private residence Living Arrangements: Other relatives;Children (DTR works from home, planning on helping at [place]) Available Help at Discharge: Family Type of Home: House (DTR's home at [place]) Home Access: Stairs to enter Secretary/administrator of Steps: 3 Entrance Stairs-Rails: Right Home Layout: One level                   Additional Comments: no prior DME use; was limited in mobility PTA due to osteoarthristis (DTR).      Prior Functioning/Environment          Comments: still driving PTA, making groceries        OT Problem List: Decreased strength;Decreased range of motion;Decreased activity tolerance;Impaired balance (sitting and/or standing);Decreased cognition;Decreased safety awareness;Decreased knowledge of use of DME or AE;Decreased knowledge of precautions;Cardiopulmonary status limiting activity;Obesity      OT Treatment/Interventions: Self-care/ADL training;DME and/or AE instruction;Therapeutic activities;Balance training;Therapeutic exercise;Cognitive remediation/compensation;Energy conservation;Patient/family education    OT Goals(Current goals  can be found in the care plan section) Acute Rehab OT Goals Patient Stated Goal: be able to return to DTRs home OT Goal Formulation: With patient Time For Goal Achievement: 07/06/20 Potential to Achieve Goals: Fair ADL Goals Pt Will Perform Grooming: with min guard assist;sitting Pt Will Perform Upper Body Dressing: with min assist;sitting Pt Will Perform Lower Body Dressing: with mod assist;sitting/lateral leans (with AE PRN) Pt Will Transfer to Toilet: with max assist;with +2 assist;stand pivot transfer;bedside commode Pt Will Perform Toileting - Clothing Manipulation and hygiene: with max assist;with 2+ total assist;sit to/from stand Pt/caregiver will Perform Home Exercise Program: Increased strength;Both right and left upper extremity;With minimal assist  OT Frequency: Min 1X/week   Barriers to D/C:            Co-evaluation              AM-PAC OT "6 Clicks" Daily Activity     Outcome Measure Help from another person eating meals?: A Little Help from another person taking care of personal grooming?: A Little Help from another person toileting, which includes using toliet, bedpan, or urinal?: Total Help from another person bathing (including washing, rinsing, drying)?: Total Help from another person to put on and taking off regular upper body clothing?: A Lot Help from another person to put on and taking off regular lower body clothing?: Total 6 Click Score: 11   End of Session Nurse Communication:  Other (comment) (notified RN that pt not mobilized during OT session d/t HR increase while still at rest)  Activity Tolerance: Treatment limited secondary to medical complications (Comment) (HR elevated) Patient left: in bed;with call bell/phone within reach;with bed alarm set  OT Visit Diagnosis: Unsteadiness on feet (R26.81);Muscle weakness (generalized) (M62.81)                Time: 1719-1730 OT Time Calculation (min): 11 min Charges:  OT General Charges $OT Visit: 1  Visit OT Evaluation $OT Eval Moderate Complexity: 1 [address] [place], OTR/L ascom [phone] 06/22/20, 6:09 PM

## 2020-06-22 NOTE — [place] FL2 (Signed)
[place] MEDICAID FL2 LEVEL OF CARE SCREENING TOOL     IDENTIFICATION  Patient Name: Chelsea Glass Birthdate: January 05, 1956 Sex: female Admission Date (Current Location): 06/12/2020  [place] and [place] Number:  Chiropodist and Address:  [facility], [address], [place], [place] [address]      Provider Number: [id number]  Attending Physician Name and Address:  Hughie Closs, MD  Relative Name and Phone Number:       Current Level of Care: Hospital Recommended Level of Care: Skilled Nursing Facility Prior Approval Number:    Date Approved/Denied:   PASRR Number: [id number]  Discharge Plan: SNF    Current Diagnoses: Patient Active Problem List   Diagnosis Date Noted  . Postmenopausal bleeding   . Hypertension   . Acute respiratory failure with hypoxia (HCC) 06/13/2020  . Obesity, Class III, BMI 40-49.9 (morbid obesity) (HCC) 06/13/2020  . Severe sepsis (HCC) 06/13/2020  . New onset atrial fibrillation (HCC) 06/13/2020  . Pneumonia due to COVID-19 virus 06/12/2020    Orientation RESPIRATION BLADDER Height & Weight     Self, Place  O2 ([place] 5L) External catheter, Incontinent (palced 9/6) Weight: 272 lb 4.8 oz (123.5 kg) Height:  5\' 6"  (167.6 cm)  BEHAVIORAL SYMPTOMS/MOOD NEUROLOGICAL BOWEL NUTRITION STATUS      Incontinent Diet (carb modified thin liquids)  AMBULATORY STATUS COMMUNICATION OF NEEDS Skin   Extensive Assist Verbally Normal                       Personal Care Assistance Level of Assistance  Bathing, Dressing, Feeding Bathing Assistance: Maximum assistance Feeding assistance: Independent Dressing Assistance: Maximum assistance     Functional Limitations Info  Sight, Hearing, Speech Sight Info: Adequate Hearing Info: Adequate Speech Info: Adequate    SPECIAL CARE FACTORS FREQUENCY  PT (By licensed PT), OT (By licensed OT)     PT Frequency: 5x OT Frequency: 5x            Contractures  Contractures Info: Not present    Additional Factors Info  Code Status, Allergies Code Status Info: Full Code Allergies Info: Lisinopril           Current Medications (06/22/2020):  This is the current hospital active medication list Current Facility-Administered Medications  Medication Dose Route Frequency Provider Last Rate Last Admin  . acetaminophen (TYLENOL) tablet 650 mg  650 mg Oral Q6H PRN 06/24/2020, MD   650 mg at 06/18/20 0455  . ascorbic acid (VITAMIN C) tablet 500 mg  500 mg Oral Daily 08/18/20, MD   500 mg at 06/22/20 0925  . bisacodyl (DULCOLAX) EC tablet 5 mg  5 mg Oral Once 06/24/20, RPH      . carvedilol (COREG) tablet 12.5 mg  12.5 mg Oral BID WC Ronnald Ramp, MD   12.5 mg at 06/22/20 0925  . Chlorhexidine Gluconate Cloth 2 % PADS 6 each  6 each Topical Daily 06/24/20, MD   6 each at 06/22/20 [phone]  . chlorpheniramine-HYDROcodone (TUSSIONEX) 10-8 MG/5ML suspension 5 mL  5 mL Oral Q12H PRN 1607, NP   5 mL at 06/16/20 1007  . enoxaparin (LOVENOX) injection 62.5 mg  0.5 mg/kg Subcutaneous Q24H 08/16/20, RPH   62.5 mg at 06/21/20 2153  . feeding supplement (NEPRO CARB STEADY) liquid 237 mL  237 mL Oral TID BM 2154, MD   237 mL at 06/22/20 1223  . hydrALAZINE (  APRESOLINE) injection 10 mg  10 mg Intravenous Q6H PRN Gertha Calkin, MD   10 mg at 06/21/20 1809  . hydrochlorothiazide (MICROZIDE) capsule 12.5 mg  12.5 mg Oral BID Gertha Calkin, MD   12.5 mg at 06/22/20 0925  . insulin aspart (novoLOG) injection 0-15 Units  0-15 Units Subcutaneous TID WC Gertha Calkin, MD   5 Units at 06/22/20 1221  . insulin aspart (novoLOG) injection 0-5 Units  0-5 Units Subcutaneous QHS Irena Cords V, MD      . insulin glargine (LANTUS) injection 15 Units  15 Units Subcutaneous QHS Hughie Closs, MD      . Ipratropium-Albuterol (COMBIVENT) respimat 1 puff  1 puff Inhalation Q6H Drema Dallas, MD   1 puff at 06/22/20 1223  . melatonin  tablet 2.5 mg  2.5 mg Oral QHS Jimmye Norman, NP   2.5 mg at 06/21/20 2152  . methylPREDNISolone sodium succinate (SOLU-MEDROL) 125 mg/2 mL injection 60 mg  60 mg Intravenous Q12H Drema Dallas, MD   60 mg at 06/22/20 1610  . multivitamin with minerals tablet 1 tablet  1 tablet Oral Daily Gertha Calkin, MD   1 tablet at 06/22/20 0925  . ondansetron (ZOFRAN) tablet 4 mg  4 mg Oral Q6H PRN Lurene Shadow, MD       Or  . ondansetron [facility]) injection 4 mg  4 mg Intravenous Q6H PRN Lurene Shadow, MD   4 mg at 06/21/20 2149  . sodium chloride flush (NS) 0.9 % injection 10-40 mL  10-40 mL Intracatheter Q12H Gertha Calkin, MD   10 mL at 06/22/20 0926  . sodium chloride flush (NS) 0.9 % injection 10-40 mL  10-40 mL Intracatheter PRN Gertha Calkin, MD      . zinc sulfate capsule 220 mg  220 mg Oral Daily Drema Dallas, MD   220 mg at 06/22/20 9604     Discharge Medications: Please see discharge summary for a list of discharge medications.  Relevant Imaging Results:  Relevant Lab Results:   Additional Information SSN:[id number]  Maree Krabbe, LCSW

## 2020-06-22 NOTE — Progress Notes (Signed)
Nutrition Follow-Up Note   DOCUMENTATION CODES:   Morbid obesity  INTERVENTION:   Nepro Shake po TID, each supplement provides 425 kcal and 19 grams protein  MVI daily   Pt at high refeed risk; recommend monitor K, Mg and P labs daily as oral intake improves.   NUTRITION DIAGNOSIS:   Increased nutrient needs related to catabolic illness (COVID 19) as evidenced by estimated needs.  GOAL:   Patient will meet greater than or equal to 90% of their needs  -not met   MONITOR:   PO intake, Supplement acceptance, Labs, Weight trends, Skin, I & O's  ASSESSMENT:   64 y.o. female with medical history significant for hypertension, diabetes mellitus, arthritis, morbid obesity, was brought to the hospital because of altered mental status and found to have sepsis and COVID 19 PNA  Unable to speak with pt r/t confusion. Spoke with RN, pt continues to have poor appetite and oral intake; pt is eating only sips and bites of meals and Nepro supplements. Pt was eating fair at the beginning of her admission and was also drinking the Nepro supplements but her appetite has now declined. RN does not feel as though pt would tolerate nasogastric tube r/t her confusion. RN is going to try to encourage pt to eat some lunch and drink her supplements. May need to attempt  nasogastric tube placement and tube feeds if pt's oral intake does not improve in the next 1-2 days. Pt is at high refeed risk. Of note, pt with new vaginal bleeding and seen by Obstetrics; plan is for outpatient follow-up.   Per chart, pt has remained fairly weight stable since admit.   Medications reviewed and include: vitamin C, dulcolax, lovenox, melatonin, insulin, solu-medrol, zinc, MVI   Labs reviewed: K 4.4 wnl, BUN 38(H), alkphos 274(H), ALT 64(H) P 2.7 wnl, Mg 2.3 wnl cbgs- 178, 202 x 24 hrs  Diet Order:   Diet Order            Diet Carb Modified Fluid consistency: Thin; Room service appropriate? Yes  Diet effective now                 EDUCATION NEEDS:   Not appropriate for education at this time  Skin:  Skin Assessment: Reviewed RN Assessment (Ecchymosis)  Last BM:  9/6  Height:   Ht Readings from Last 1 Encounters:  06/12/20 5' 6"  (1.676 m)    Weight:   Wt Readings from Last 1 Encounters:  06/22/20 123.5 kg    Ideal Body Weight:  59 kg  BMI:  Body mass index is 43.95 kg/m.  Estimated Nutritional Needs:   Kcal:  2600-2900kcal/day  Protein:  >130g/day  Fluid:  1.8-2.1L/day  Koleen Distance MS, RD, LDN Please refer to [facility] for RD and/or RD on-call/weekend/after hours pager

## 2020-06-22 NOTE — Progress Notes (Signed)
PROGRESS NOTE    Chelsea Glass  VHQ:[id number] DOB: 1955/10/22 DOA: 06/12/2020 PCP: [facility]    Brief Narrative:  Chelsea Glass is a 64 y.o. female with medical history significant for hypertension, diabetes mellitus, arthritis, morbid obesity, was Brought to the hospital because of altered mental status. Reportedly, EMS was called because patient had been laying in bed for about 5 days without getting up. Apparently, she had been laying in herfeces and urine. Per report, EMS had been called to the house about 4 days prior to admission but patient had refused transport to the hospital at that time.Brought to the hospital because of altered mental status.  Upon arrival to ED, patient tested positive for COVID-19.  She was in rapid A. fib and for that she was given bolus of IV Cardizem.  She was started on IV dexamethasone and remdesivir and admitted to hospitalist service for severe sepsis. patient met criteria for severe sepsis HR> 90,RR> 20, respiratory distress, lactic acid> 2  Assessment & Plan:   Active Problems:   Pneumonia due to COVID-19 virus   Acute respiratory failure with hypoxia (HCC)   Obesity, Class III, BMI 40-49.9 (morbid obesity) (HCC)   Severe sepsis (HCC)   New onset atrial fibrillation (HCC)   Hypertension   Postmenopausal bleeding Severe sepsis secondary to COVID-19 pneumonia- resolved.  -On admission patient met criteria for severe sepsis HR> 90,RR> 20, respiratory distress, lactic acid> 2.  Treat underlying cause as mentioned below. -Lactic acidosis has resolved post fluid resuscitation  Acute respiratory failure with hypoxia/Covid pneumonia COVID-19 Labs  Recent Labs    06/20/20 0601 06/21/20 0530 06/22/20 0530  FERRITIN 227 251 260  LDH 323* 299* 291*  CRP 5.8* 2.6* 1.2*   Completed remdesivir on 06/16/2020. -Continue Solu-Medrol 60 mg BID.  I do not see baricitinib used during this hospitalization on this patient and now  that she seems to have improved, I would forego that. --Anticoagulation with Lovenox 62.5 mg subcu every 24. -Vitamin C and zinc per Covid protocol -Incentive spirometry -Flutter valve  -Respimat QID SpO2: 97 % O2 Flow Rate (L/min): 5 L/min FiO2 (%): 65 %  COVID-19 Labs  Recent Labs    06/20/20 0601 06/21/20 0530 06/22/20 0530  FERRITIN 227 251 260  LDH 323* 299* 291*  CRP 5.8* 2.6* 1.2*    Lab Results  Component Value Date   SARSCOV2NAA POSITIVE (A) 06/12/2020   [place] NEGATIVE 07/31/2019    Chest pain: PE and aortic dissection ruled out.  Echo shows normal ejection fraction and no wall motion abnormality.  She did not complain of chest pain to me today.  Postmenopausal vaginal bleeding: Pelvic ultrasound shows 7 mm thickness.  Seen by OB/GYN.  They recommended outpatient endometrial sampling.  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  Diabetes type 2 uncontrolled with hyperglycemia Blood sugar slightly elevated.  Will increase Lantus from 10 units to 15 units tonight and continue SSI.  HLD -9/6 LDL= 234 -As soon as patient completes treatment for Covid would start patient on Lipitor 40 mg daily.  Essential HTN -Fairly controlled.  Continue Coreg 12.5 mg BID and hydrochlorothiazide and as needed hydralazine.  New onset brief episode of A. fib -Required IV Cardizem but now in sinus rhythm for last few days.  Hypokalemia Resolved  Hypomagnesmia Resolved.  Hypophosphatemia Resolved  Elevated liver enzymes Likely due to COVID-19 pneumonia.  Very slowly improving.   ~~~~~~~~~~~~~~~~~~~~~~~~~~~~~~~~~~~~~~~~~~~~~~~~~~~~~~~~~~~~~~~  DVT prophylaxis: Lovenox.  Code Status: Full Family Communication: Spoke to patient's daughter Chelsea Glass over the phone and answered several questions. Disposition Plan: d/c to SNF when bed available.  Status is: Inpatient Remains inpatient appropriate  because:Inpatient level of care appropriate due to severity of illness  Dispo: The patient is from: Home              Anticipated d/c is to: Home vs SNF              Anticipated d/c date is: 1 to 2 days              Patient currently is not medically stable to d/c.   ~~~~~~~~~~~~~~~~~~~~~~~~~~~~~~~~~~~~~~~~~~~~~~~~~~~~~~~~~~~~~~~  Subjective:  Seen and examined today.  She states that her breathing is better than yesterday.  No complaints.  COVID-19 Labs  Recent Labs    06/20/20 0601 06/21/20 0530 06/22/20 0530  FERRITIN 227 251 260  LDH 323* 299* 291*  CRP 5.8* 2.6* 1.2*    Lab Results  Component Value Date   SARSCOV2NAA POSITIVE (A) 06/12/2020   [place] NEGATIVE 07/31/2019    COVID-19 Labs  Recent Labs    06/20/20 0601 06/21/20 0530 06/22/20 0530  FERRITIN 227 251 260  LDH 323* 299* 291*  CRP 5.8* 2.6* 1.2*    Lab Results  Component Value Date   SARSCOV2NAA POSITIVE (A) 06/12/2020   [place] NEGATIVE 07/31/2019   9/13  SpO2: 97 % O2 Flow Rate (L/min): 5 L/min FiO2 (%): 65 % COVID-19 Labs  Recent Labs    06/20/20 0601 06/21/20 0530 06/22/20 0530  FERRITIN 227 251 260  LDH 323* 299* 291*  CRP 5.8* 2.6* 1.2*    Lab Results  Component Value Date   SARSCOV2NAA POSITIVE (A) 06/12/2020   SARSCOV2NAA NEGATIVE 07/31/2019      Objective: Vitals:   06/22/20 1155 06/22/20 1200 06/22/20 1205 06/22/20 1230  BP:   (!) 130/96   Pulse: 88 87 95   Resp: _0 Temp:   98.1 F (36.7 C)   TempSrc:   Oral   SpO2: 98% 98% 93% 97%  Weight:      Height:        Intake/Output Summary (Last 24 hours) at 06/22/2020 1310 Last data filed at 06/22/2020 1204 Gross per 24 hour  Intake 200 ml  Output 600 ml  Net -400 ml   Filed Weights   06/19/20 0440 06/21/20 0459 06/22/20 0506  Weight: 126.6 kg 123.8 kg 123.5 kg   Examination: Blood pressure (!) 130/96, pulse 95, temperature 98.1 F (36.7 C), temperature source Oral, resp. rate 19, height  _1  (1.676 m), weight 123.5 kg, SpO2 97 %.  General exam: Appears calm and comfortable  Respiratory system: Diminished breath sounds at the bases. Respiratory effort normal. Cardiovascular system: S1 & S2 heard, RRR. No JVD, murmurs, rubs, gallops or clicks. No pedal edema. Gastrointestinal system: Abdomen is nondistended, soft and nontender. No organomegaly or masses felt. Normal bowel sounds heard. Central nervous system: Alert and oriented. No focal neurological deficits. Extremities: Symmetric 5 x 5 power. Skin: No rashes, lesions or ulcers.    Data Reviewed:  CBC: Recent Labs  Lab 06/17/20 0502 06/19/20 0412 06/20/20 0601 06/21/20 0530 06/22/20 0530  WBC 14.2* 16.5* 12.9* 10.6* 10.3  NEUTROABS 11.4* 12.9* 9.8* 7.9* 7.9*  HGB 15.5* 15.2* 15.2* 14.5 14.0  HCT 46.5* 45.8 43.9 43.4 42.9  MCV 81.7 82.1 80.0 82.7 84.3  PLT 257 349 347 313 014   Basic Metabolic Panel:  Recent Labs  Lab 06/16/20 0658 06/16/20 0658 06/17/20 0502 06/17/20 1014 06/19/20 0412 06/20/20 0601 06/21/20 0530 06/22/20 0530  NA 139   < > 141  --  141 139 137 138  K 4.4   < > 4.6  --  4.2 4.3 4.2 4.4  CL 104   < > 107  --  107 105 102 103  CO2 25   < > 23  --  _0 GLUCOSE 127*   < > 99  --  168* 185* 307* 173*  BUN 44*   < > 40*  --  33* 34* 36* 38*  CREATININE 1.10*   < > 1.10*  --  0.85 0.95 0.88 1.00  CALCIUM 9.4   < > 9.4  --  10.0 9.9 9.7 9.8  MG 2.5*  --  2.6* 2.6* 2.3  --   --   --   PHOS 4.5  --  3.2  --  2.7  --   --   --    < > = values in this interval not displayed.   GFR: Estimated Creatinine Clearance: 75.3 mL/min (by C-G formula based on SCr of 1 mg/dL). Liver Function Tests: Recent Labs  Lab 06/17/20 0502 06/19/20 0412 06/20/20 0601 06/21/20 0530 06/22/20 0530  AST 37 40 29 24 34  ALT 74* 92* 74* 61* 64*  ALKPHOS 399* 412* 343* 298* 274*  BILITOT 1.0 0.8 0.9 0.7 0.7  PROT 6.4* 6.6 6.9 6.5 6.2*  ALBUMIN 2.7* 2.8* 2.7* 2.6* 2.6*    No results for input(s):  HGBA1C in the last 72 hours. CBG: Recent Labs  Lab 06/21/20 1219 06/21/20 1720 06/21/20 2033 06/22/20 0832 06/22/20 1201  GLUCAP 234* 129* 127* 178* 202*   Anemia Panel: Recent Labs    06/21/20 0530 06/22/20 0530  FERRITIN 251 260   Sepsis Labs: No results for input(s): PROCALCITON, LATICACIDVEN in the last 168 hours.  Recent Results (from the past 240 hour(s))  SARS Coronavirus 2 by RT PCR (hospital order, performed in [facility] hospital lab) Nasopharyngeal Nasopharyngeal Swab     Status: Abnormal   Collection Time: 06/12/20  3:21 PM   Specimen: Nasopharyngeal Swab  Result Value Ref Range Status   SARS Coronavirus 2 POSITIVE (A) NEGATIVE Final    Comment: RESULT CALLED TO, READ BACK BY AND VERIFIED WITH: MEGAN JONES AT 1634 06/12/20.PMF (NOTE) SARS-CoV-2 target nucleic acids are DETECTED  SARS-CoV-2 RNA is generally detectable in upper respiratory specimens  during the acute phase of infection.  Positive results are indicative  of the presence of the identified virus, but do not rule out bacterial infection or co-infection with other pathogens not detected by the test.  Clinical correlation with patient history and  other diagnostic information is necessary to determine patient infection status.  The expected result is negative.  Fact Sheet for Patients:   StrictlyIdeas.no   Fact Sheet for Healthcare Providers:   BankingDealers.co.za    This test is not yet approved or cleared by the [place] FDA and  has been authorized for detection and/or diagnosis of SARS-CoV-2 by FDA under an Emergency Use Authorization (EUA).  This EUA will remain in effect (meaning this test c an be used) for the duration of  the COVID-19 declaration under Section 564(b)(1) of the Act, 21 U.S.C. section 360-bbb-3(b)(1), unless the authorization is terminated or revoked sooner.  Performed at [facility], [address]  [place],  [address]   Blood culture (  routine x 2)     Status: None   Collection Time: 06/12/20  3:21 PM   Specimen: BLOOD  Result Value Ref Range Status   Specimen Description BLOOD RIGHT FOREARM  Final   Special Requests   Final    BOTTLES DRAWN AEROBIC AND ANAEROBIC Blood Culture adequate volume   Culture   Final    NO GROWTH 5 DAYS Performed at [facility], [address], [place], [place] [address]    Report Status 06/17/2020 FINAL  Final  Blood culture (routine x 2)     Status: None   Collection Time: 06/12/20  3:21 PM   Specimen: BLOOD  Result Value Ref Range Status   Specimen Description BLOOD LEFT HAND  Final   Special Requests   Final    BOTTLES DRAWN AEROBIC AND ANAEROBIC Blood Culture adequate volume   Culture   Final    NO GROWTH 5 DAYS Performed at [facility], [address] [place], [place] [address]    Report Status 06/17/2020 FINAL  Final     Scheduled Meds: . vitamin C  500 mg Oral Daily  . bisacodyl  5 mg Oral Once  . carvedilol  12.5 mg Oral BID WC  . Chlorhexidine Gluconate Cloth  6 each Topical Daily  . enoxaparin (LOVENOX) injection  0.5 mg/kg Subcutaneous Q24H  . feeding supplement (NEPRO CARB STEADY)  237 mL Oral TID BM  . hydrochlorothiazide  12.5 mg Oral BID  . insulin aspart  0-15 Units Subcutaneous TID WC  . insulin aspart  0-5 Units Subcutaneous QHS  . insulin glargine  10 Units Subcutaneous QHS  . Ipratropium-Albuterol  1 puff Inhalation Q6H  . melatonin  2.5 mg Oral QHS  . methylPREDNISolone (SOLU-MEDROL) injection  60 mg Intravenous Q12H  . multivitamin with minerals  1 tablet Oral Daily  . sodium chloride flush  10-40 mL Intracatheter Q12H  . zinc sulfate  220 mg Oral Daily   Continuous Infusions:    LOS: 10 days   Darliss Cheney, MD [facility] If 7PM-7AM, please contact night-coverage [email or web address] 06/22/2020, 1:10 PM

## 2020-06-22 NOTE — TOC Initial Note (Signed)
Transition of Care [facility]) - Initial/Assessment Note    Patient Details  Name: Chelsea Glass MRN: [id number] Date of Birth: Jul 17, 1956  Transition of Care [facility]) CM/SW Contact:    Maree Krabbe, LCSW Phone Number: 06/22/2020, 1:38 PM  Clinical Narrative:    CSW spoke with pt's daughter via telephone. [name] is agreeable for pt to go to SNF at d/c. [name] did not have a preference. CSW provided Medicare SNF list via over the phone. [name] is requesting pt go to [facility]. Referral sent--will follow up with SNF and pt's daughter.               Expected Discharge Plan: Skilled Nursing Facility Barriers to Discharge: Continued Medical Work up   Patient Goals and CMS Choice Patient states their goals for this hospitalization and ongoing recovery are:: for pt to get better   Choice offered to / list presented to : Adult Children  Expected Discharge Plan and Services Expected Discharge Plan: Skilled Nursing Facility In-house Referral: NA   Post Acute Care Choice: Skilled Nursing Facility Living arrangements for the past 2 months: Single Family Home                                      Prior Living Arrangements/Services Living arrangements for the past 2 months: Single Family Home Lives with:: Self Patient language and need for interpreter reviewed:: Yes Do you feel safe going back to the place where you live?: Yes      Need for Family Participation in Patient Care: Yes (Comment) Care giver support system in place?: Yes (comment)   Criminal Activity/Legal Involvement Pertinent to Current Situation/Hospitalization: No - Comment as needed  Activities of Daily Living Home Assistive Devices/Equipment: Walker (specify type) ADL Screening (condition at time of admission) Patient's cognitive ability adequate to safely complete daily activities?: Yes Is the patient deaf or have difficulty hearing?: No Does the patient have difficulty seeing, even when wearing  glasses/contacts?: No Does the patient have difficulty concentrating, remembering, or making decisions?: No Patient able to express need for assistance with ADLs?: Yes Does the patient have difficulty dressing or bathing?: No Independently performs ADLs?: Yes (appropriate for developmental age) Does the patient have difficulty walking or climbing stairs?: Yes Weakness of Legs: Both Weakness of Arms/Hands: None  Permission Sought/Granted Permission sought to share information with : Family Supports    Share Information with NAME: Deloris  Permission granted to share info w AGENCY: [facility]  Permission granted to share info w Relationship: Daughter     Emotional Assessment Appearance:: Appears stated age Attitude/Demeanor/Rapport: Unable to Assess Affect (typically observed): Unable to Assess Orientation: : Oriented to Self, Oriented to Place Alcohol / Substance Use: Not Applicable Psych Involvement: No (comment)  Admission diagnosis:  Sinus tachycardia [R00.0] Lactic acidosis [E87.2] SVT (supraventricular tachycardia) (HCC) [I47.1] Hyperglycemia [R73.9] Hypoxia [R09.02] Pneumonia due to COVID-19 virus [U07.1, J12.82] COVID-19 [U07.1] Patient Active Problem List   Diagnosis Date Noted  . Postmenopausal bleeding   . Hypertension   . Acute respiratory failure with hypoxia (HCC) 06/13/2020  . Obesity, Class III, BMI 40-49.9 (morbid obesity) (HCC) 06/13/2020  . Severe sepsis (HCC) 06/13/2020  . New onset atrial fibrillation (HCC) 06/13/2020  . Pneumonia due to COVID-19 virus 06/12/2020   PCP:  [employer] Pharmacy:   [id number] - [place], [place] - [address]  [place] [address] Phone: [phone] Fax: [phone]     Social Determinants of Health (SDOH) Interventions    Readmission Risk Interventions No flowsheet data found.

## 2020-06-22 NOTE — Plan of Care (Addendum)
Pt resting in bed, pt not eating much due to poor appetite, encouraged pt to eat and drink.  Problem: Health Behavior/Discharge Planning: Goal: Ability to manage health-related needs will improve Outcome: Progressing   Problem: Clinical Measurements: Goal: Cardiovascular complication will be avoided Outcome: Progressing   Problem: Coping: Goal: Level of anxiety will decrease Outcome: Progressing   Problem: Education: Goal: Knowledge of General Education information will improve Description: Including pain rating scale, medication(s)/side effects and non-pharmacologic comfort measures Outcome: Not Progressing   Problem: Activity: Goal: Risk for activity intolerance will decrease Outcome: Not Progressing

## 2020-06-22 NOTE — Progress Notes (Addendum)
Physical Therapy Treatment Patient Details Name: Chelsea Glass MRN: [id number] DOB: Sep 15, 1956 Today's Date: 06/22/2020    History of Present Illness Lilly Neglia is a 63yoF who comes to [facility] 9/5 c AMS, pt had been in bed for 5 days, found soiled. PMH: HTN, DM, OA, obesity.  Pt (+) c COVID, in AF upon arrival.    PT Comments    Pt in bed upon entry, agreeable to participate this date. Pt has [place] donned around forehead, unable to reports how, when, or why this happened. She is able to recognize author from previous day. Mentation initially better for first part of session, but is worse after higher exersion activity. Pt requires cues to attend to task of exercises. Pt able to attempt standing twice this date with prolonged standing but does have some falls anxiety. Pt requires MaxA to come to standing. Pt still requires maxA rolling in bed. Pt able to remain saturated on room air in session, but has some apparent desaturation at end of session during transfers and bed mobility, 2L donned with subsequent sats 93-95%. RN/MD made aware of author's concerns regarding patient's mental status at end of session.     Follow Up Recommendations  SNF;Supervision for mobility/OOB     Equipment Recommendations  None recommended by PT    Recommendations for Other Services       Precautions / Restrictions Precautions Precautions: Fall Precaution Comments: vaginal serosanguinous discharge Restrictions Weight Bearing Restrictions: No    Mobility  Bed Mobility Overal bed mobility: Needs Assistance Bed Mobility: Supine to Sit;Sit to Supine     Supine to sit: Max assist;+2 for physical assistance Sit to supine: Max assist;+2 for physical assistance      Transfers Overall transfer level: Needs assistance Equipment used: 1 person hand held assist Transfers: Sit to/from Stand Sit to Stand: Mod assist         General transfer comment: able to tolerate standing for less than 10  seconds both times  Ambulation/Gait                 Stairs             Wheelchair Mobility    Modified Rankin (Stroke Patients Only)       Balance                                            Cognition Arousal/Alertness: Awake/alert   Overall Cognitive Status: Impaired/Different from baseline                                        Exercises Other Exercises Other Exercises: ABDCT/ADD, Heel slides, leg press, SAQ Bridging inititation 1x10 bilat; AA/ROM provided as needed, most assistance required for attending to task Other Exercises: *cognitively struggles with following cues for bridging, eventually able to perform    General Comments        Pertinent Vitals/Pain Pain Assessment: No/denies pain    Home Living                      Prior Function            PT Goals (current goals can now be found in the care plan section) Acute Rehab PT Goals Patient Stated Goal: be able to return  to [facility] home PT Goal Formulation: With family Time For Goal Achievement: 07/05/20 Potential to Achieve Goals: Fair Progress towards PT goals: Progressing toward goals    Frequency    Min 2X/week      PT Plan Current plan remains appropriate    Co-evaluation              AM-PAC PT "6 Clicks" Mobility   Outcome Measure  Help needed turning from your back to your side while in a flat bed without using bedrails?: Total Help needed moving from lying on your back to sitting on the side of a flat bed without using bedrails?: Total Help needed moving to and from a bed to a chair (including a wheelchair)?: Total Help needed standing up from a chair using your arms (e.g., wheelchair or bedside chair)?: Total Help needed to walk in hospital room?: Total Help needed climbing 3-5 steps with a railing? : Total 6 Click Score: 6    End of Session Equipment Utilized During Treatment: Oxygen Activity Tolerance: Patient  limited by fatigue;Patient limited by lethargy;Patient tolerated treatment well Patient left: in bed;with call bell/phone within reach;with bed alarm set   PT Visit Diagnosis: Muscle weakness (generalized) (M62.81);Difficulty in walking, not elsewhere classified (R26.2);Other symptoms and signs involving the nervous system (R29.898)     Time: 6144-3154 PT Time Calculation (min) (ACUTE ONLY): 39 min  Charges:  $Therapeutic Exercise: 38-52 mins                     3:44 PM, 06/22/20 Rosamaria Lints, PT, DPT Physical Therapist - [facility]  [phone] (ASCOM)     Lolitha Tortora C 06/22/2020, 3:36 PM

## 2020-06-23 ENCOUNTER — Inpatient Hospital Stay: Payer: Medicare Other

## 2020-06-23 LAB — COMPREHENSIVE METABOLIC PANEL
ALT: 73 U/L — ABNORMAL HIGH (ref 0–44)
AST: 32 U/L (ref 15–41)
Albumin: 2.6 g/dL — ABNORMAL LOW (ref 3.5–5.0)
Alkaline Phosphatase: 254 U/L — ABNORMAL HIGH (ref 38–126)
Anion gap: 9 (ref 5–15)
BUN: 42 mg/dL — ABNORMAL HIGH (ref 8–23)
CO2: 25 mmol/L (ref 22–32)
Calcium: 10 mg/dL (ref 8.9–10.3)
Chloride: 102 mmol/L (ref 98–111)
Creatinine, Ser: 0.96 mg/dL (ref 0.44–1.00)
GFR calc Af Amer: >60 mL/min (ref >60)
GFR calc non Af Amer: >60 mL/min (ref >60)
Glucose, Bld: 232 mg/dL — ABNORMAL HIGH (ref 70–99)
Potassium: 4.4 mmol/L (ref 3.5–5.1)
Sodium: 136 mmol/L (ref 135–145)
Total Bilirubin: 0.9 mg/dL (ref 0.3–1.2)
Total Protein: 6.2 g/dL — ABNORMAL LOW (ref 6.5–8.1)

## 2020-06-23 LAB — CBC WITH DIFFERENTIAL/PLATELET
Abs Immature Granulocytes: 0.36 10*3/uL — ABNORMAL HIGH (ref 0.00–0.07)
Basophils Absolute: 0 10*3/uL (ref 0.0–0.1)
Basophils Relative: 0 %
Eosinophils Absolute: 0 10*3/uL (ref 0.0–0.5)
Eosinophils Relative: 0 %
HCT: 42.3 % (ref 36.0–46.0)
Hemoglobin: 13.8 g/dL (ref 12.0–15.0)
Immature Granulocytes: 3 %
Lymphocytes Relative: 11 %
Lymphs Abs: 1.1 10*3/uL (ref 0.7–4.0)
MCH: 27.5 pg (ref 26.0–34.0)
MCHC: 32.6 g/dL (ref 30.0–36.0)
MCV: 84.3 fL (ref 80.0–100.0)
Monocytes Absolute: 0.8 10*3/uL (ref 0.1–1.0)
Monocytes Relative: 8 %
Neutro Abs: 8.3 10*3/uL — ABNORMAL HIGH (ref 1.7–7.7)
Neutrophils Relative %: 78 %
Platelets: 224 10*3/uL (ref 150–400)
RBC: 5.02 MIL/uL (ref 3.87–5.11)
RDW: 14.6 % (ref 11.5–15.5)
WBC: 10.6 10*3/uL — ABNORMAL HIGH (ref 4.0–10.5)
nRBC: 0 % (ref 0.0–0.2)

## 2020-06-23 LAB — C-REACTIVE PROTEIN: CRP: 0.8 mg/dL (ref <1.0)

## 2020-06-23 LAB — LACTATE DEHYDROGENASE: LDH: 261 U/L — ABNORMAL HIGH (ref 98–192)

## 2020-06-23 LAB — GLUCOSE, CAPILLARY
Glucose-Capillary: 219 mg/dL — ABNORMAL HIGH (ref 70–99)
Glucose-Capillary: 212 mg/dL — ABNORMAL HIGH (ref 70–99)
Glucose-Capillary: 220 mg/dL — ABNORMAL HIGH (ref 70–99)
Glucose-Capillary: 208 mg/dL — ABNORMAL HIGH (ref 70–99)

## 2020-06-23 LAB — FERRITIN: Ferritin: 297 ng/mL (ref 11–307)

## 2020-06-23 IMAGING — CT CT HEAD W/O CM
4 series · 17 of 47 positions shown, 19 images · non-contrast
Comparison: [DATE] head CT and prior.

CLINICAL DATA: Delirium

EXAM:
CT HEAD WITHOUT CONTRAST
TECHNIQUE: Contiguous axial images were obtained from the base of the skull
through the vertex without intravenous contrast.

[Series 2: head wo · axial · 0.42mm/px · z∈[+504,+624]mm · 7 of 32 slices shown, 9 images]
[im 4/32  brain]
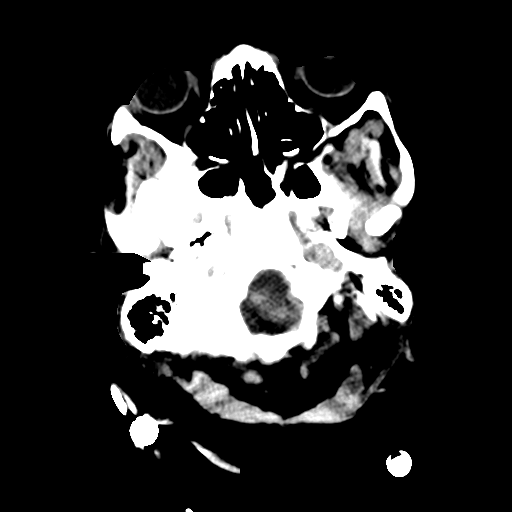
[im 4/32  bone]
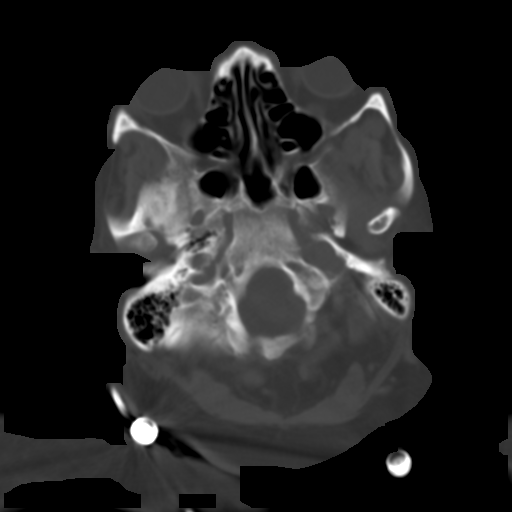
[im 8/32  brain]
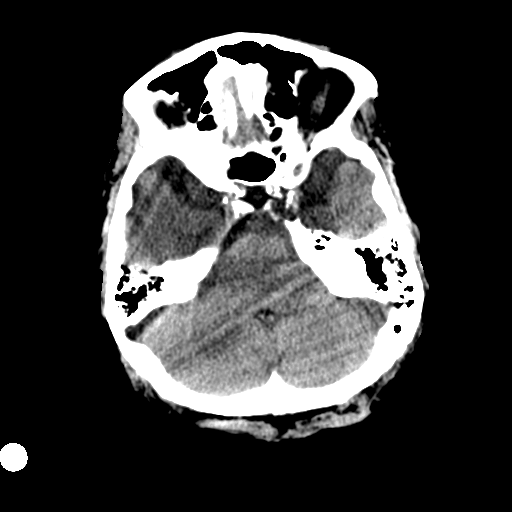
[im 12/32  brain]
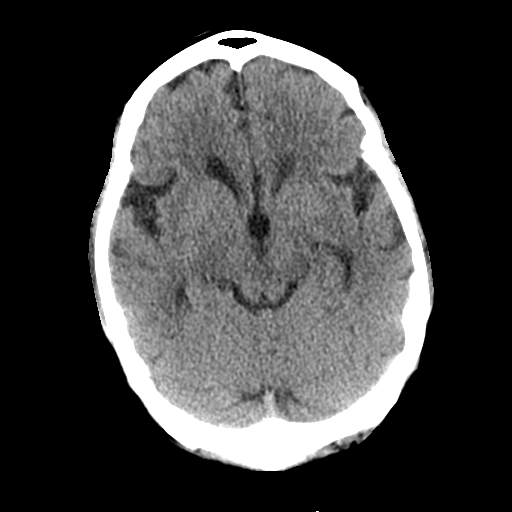
[im 16/32  brain]
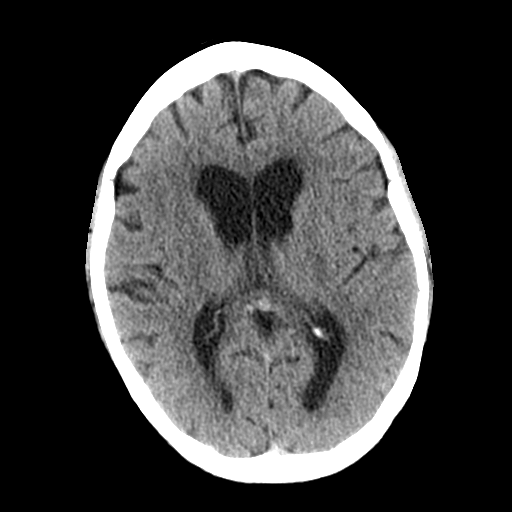
[im 20/32  brain]
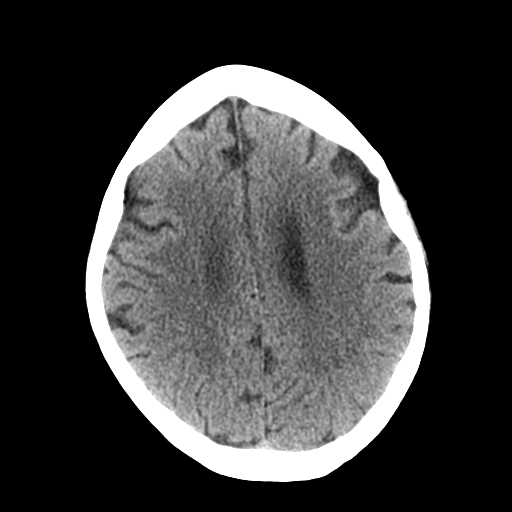
[im 20/32  bone]
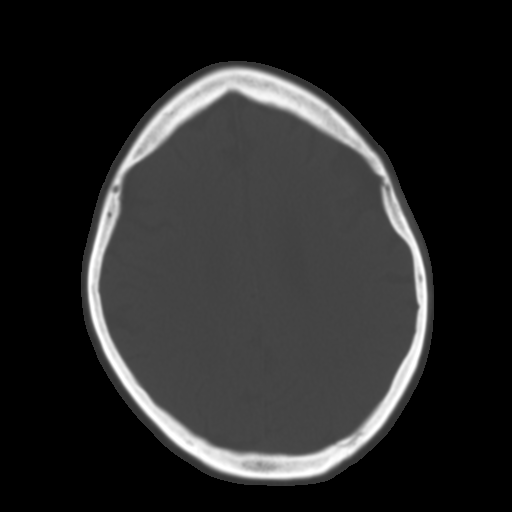
[im 24/32  brain]
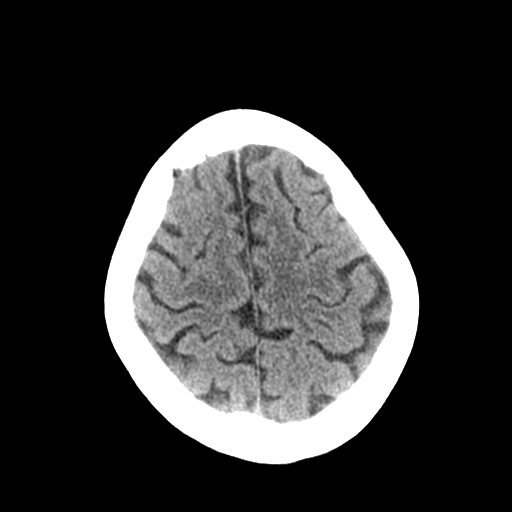
[im 28/32  brain]
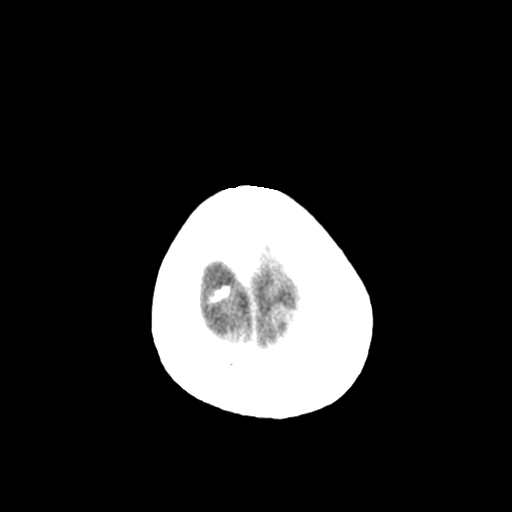

[Series 3: head bone · axial · 0.42mm/px · z∈[+503,+559]mm · 4 of 80 slices shown]
[im 8/80  bone]
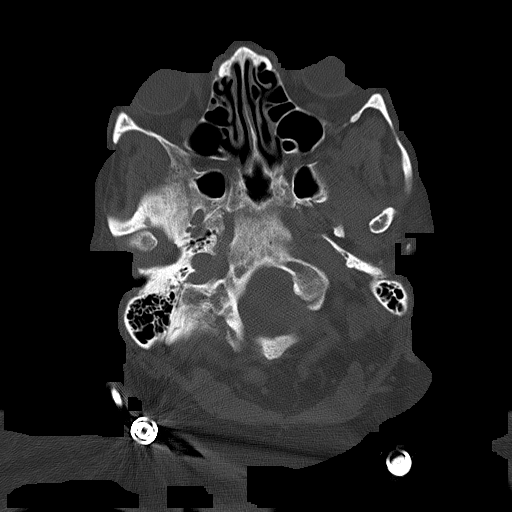
[im 16/80  bone]
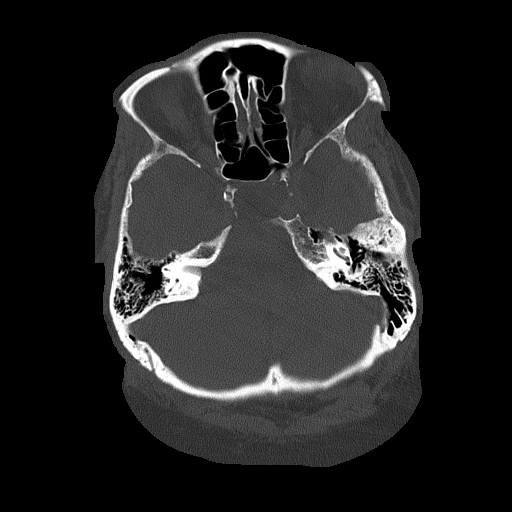
[im 24/80  bone]
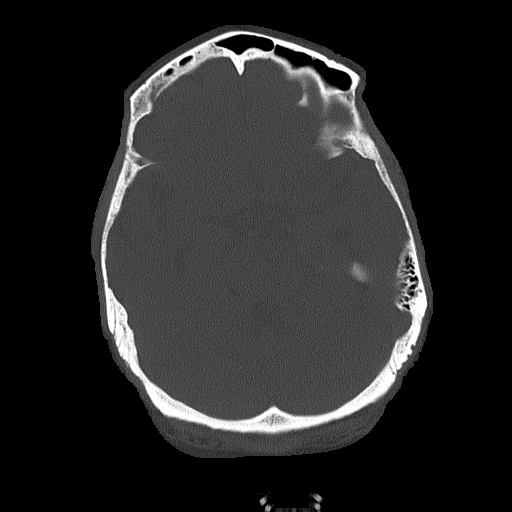
[im 36/80  bone]
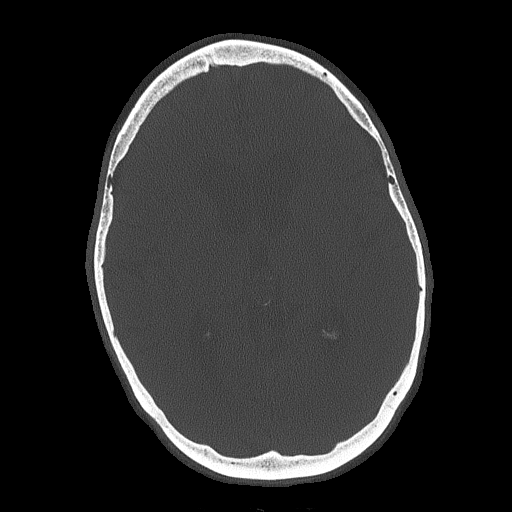

[Series 4: coronal soft tissue · coronal · 0.33mm/px · 3 of 67 slices shown]
[im 23/67  brain]
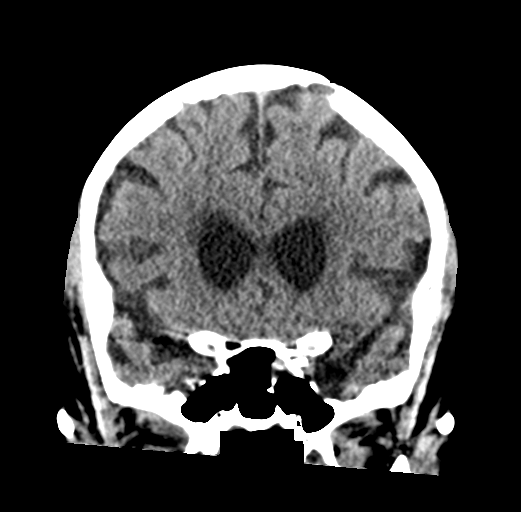
[im 30/67  brain]
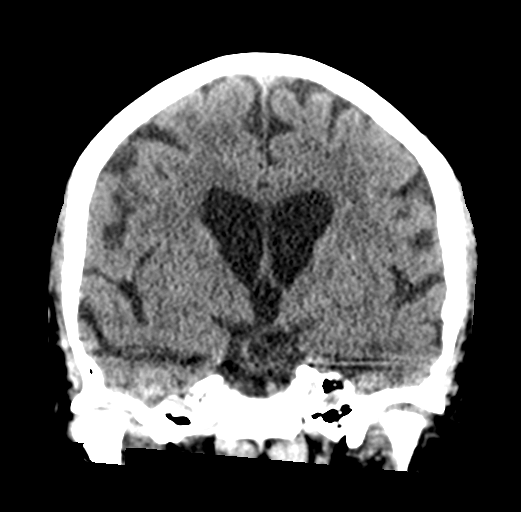
[im 37/67  brain]
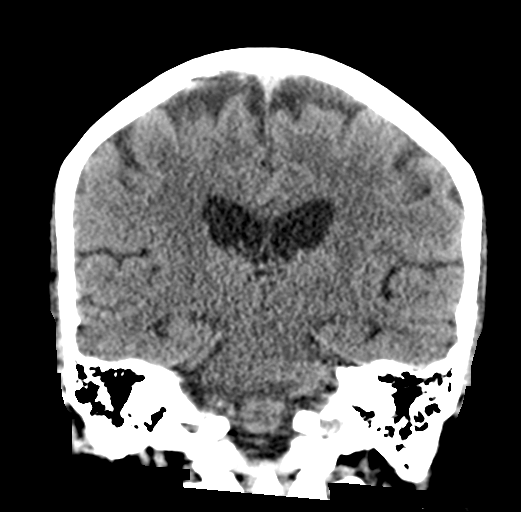

[Series 5: sagittal soft tissue · sagittal · 0.33mm/px · 3 of 57 slices shown]
[im 19/57  brain]
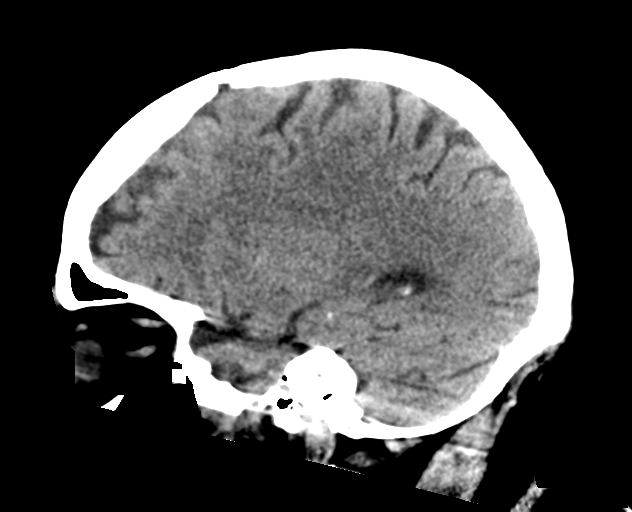
[im 29/57  brain]
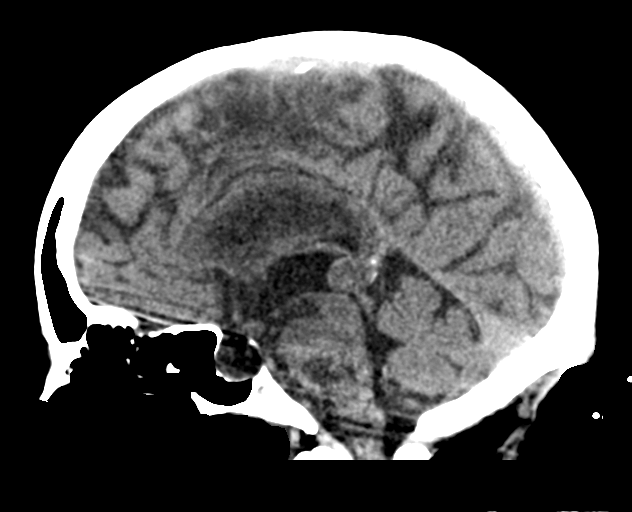
[im 38/57  brain]
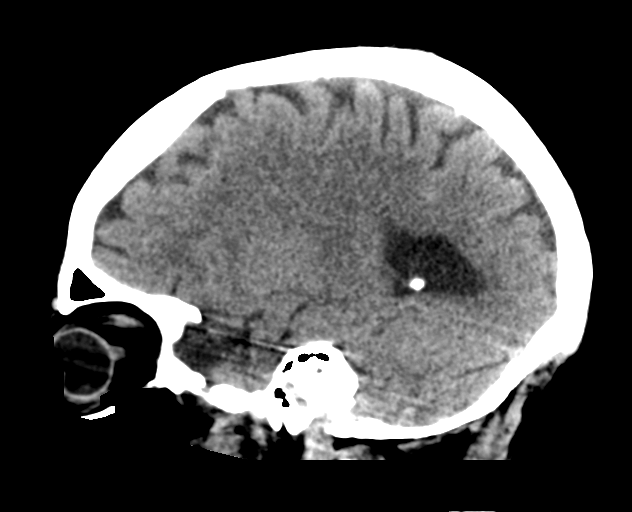

[17 of 47 positions shown; findings below may reference images not displayed]

FINDINGS: Brain: No acute infarct or intracranial hemorrhage. Incidental 7 mm
peripherally calcified pineal gland cyst. No midline shift,
ventriculomegaly or extra-axial fluid collection.

Vascular: No hyperdense vessel. Bilateral skull base calcifications.

Skull: Negative for fracture or focal lesion.

Sinuses/Orbits: Normal orbits. Clear paranasal sinuses. No mastoid
effusion.

Other: None.
IMPRESSION: No acute intracranial process.

## 2020-06-23 MED ORDER — PREDNISONE 10 MG PO TABS: 10.0000 mg | ORAL_TABLET | Freq: Every day | ORAL | Status: DC

## 2020-06-23 MED ORDER — QUETIAPINE FUMARATE 25 MG PO TABS
25.0000 mg | ORAL_TABLET | Freq: Every day | ORAL | Status: DC
  Administered 2020-06-23 – 2020-06-26 (×4): 25 mg via ORAL
  Filled 2020-06-23 (×4): qty 1

## 2020-06-23 MED ORDER — PREDNISONE 50 MG PO TABS
50.0000 mg | ORAL_TABLET | Freq: Every day | ORAL | Status: AC
  Administered 2020-06-24 – 2020-06-26 (×3): 50 mg via ORAL
  Filled 2020-06-23 (×3): qty 1

## 2020-06-23 MED ORDER — PREDNISONE 20 MG PO TABS
40.0000 mg | ORAL_TABLET | Freq: Every day | ORAL | Status: DC
  Administered 2020-06-27: 40 mg via ORAL
  Filled 2020-06-23: qty 2

## 2020-06-23 MED ORDER — PREDNISONE 20 MG PO TABS: 20.0000 mg | ORAL_TABLET | Freq: Every day | ORAL | Status: DC

## 2020-06-23 MED ORDER — PREDNISONE 50 MG PO TABS: 50.0000 mg | ORAL_TABLET | Freq: Every day | ORAL | Status: DC

## 2020-06-23 MED ORDER — PREDNISONE 20 MG PO TABS: 30.0000 mg | ORAL_TABLET | Freq: Every day | ORAL | Status: DC

## 2020-06-23 MED ORDER — INSULIN GLARGINE 100 UNIT/ML ~~LOC~~ SOLN
15.0000 [IU] | Freq: Two times a day (BID) | SUBCUTANEOUS | Status: DC
  Administered 2020-06-23 – 2020-06-24 (×3): 15 [IU] via SUBCUTANEOUS
  Filled 2020-06-23 (×4): qty 0.15

## 2020-06-23 MED ORDER — PREDNISONE 20 MG PO TABS: 40.0000 mg | ORAL_TABLET | Freq: Every day | ORAL | Status: DC

## 2020-06-23 NOTE — Progress Notes (Addendum)
PROGRESS NOTE    Chelsea Glass  ZJQ:[id number] DOB: 1956/03/16 DOA: 06/12/2020 PCP: [facility], [place]    Brief Narrative:  Chelsea Glass is a 64 y.o. female with medical history significant for hypertension, diabetes mellitus, arthritis, morbid obesity, was Brought to the hospital because of altered mental status. Reportedly, EMS was called because patient had been laying in bed for about 5 days without getting up. Apparently, she had been laying in herfeces and urine. Per report, EMS had been called to the house about 4 days prior to admission but patient had refused transport to the hospital at that time.Brought to the hospital because of altered mental status.  Upon arrival to ED, patient tested positive for COVID-19.  She was in rapid A. fib and for that she was given bolus of IV Cardizem.  She was started on IV dexamethasone and remdesivir and admitted to hospitalist service for severe sepsis. patient met criteria for severe sepsis HR> 90,RR> 20, respiratory distress, lactic acid> 2  Assessment & Plan:   Active Problems:   Pneumonia due to COVID-19 virus   Acute respiratory failure with hypoxia (HCC)   Obesity, Class III, BMI 40-49.9 (morbid obesity) (HCC)   Severe sepsis (HCC)   New onset atrial fibrillation (HCC)   Hypertension   Postmenopausal bleeding Severe sepsis secondary to COVID-19 pneumonia- resolved.  -On admission patient met criteria for severe sepsis HR> 90,RR> 20, respiratory distress, lactic acid> 2.  Sepsis physiology has resolved.  Treat underlying cause as mentioned below. -Lactic acidosis has resolved post fluid resuscitation  Acute respiratory failure with hypoxia/Covid pneumonia COVID-19 Labs  Recent Labs    06/21/20 0530 06/22/20 0530 06/23/20 0530  FERRITIN 251 260 297  LDH 299* 291* 261*  CRP 2.6* 1.2* 0.8   Completed remdesivir on 06/16/2020. -Now that she is maintaining her oxygen saturation well over 90% on room air, we will  switch from IV Solu-Medrol to tapering dose of prednisone.  I do not see baricitinib used during this hospitalization on this patient and now that she seems to have improved, I would forego that. --Anticoagulation with Lovenox 62.5 mg subcu every 24. -Vitamin C and zinc per Covid protocol -Incentive spirometry -Respimat QID Patient was encouraged to prone, out of bed to chair, to use incentive spirometry and flutter valve.  Delirium: Patient slightly lethargic and completely confused.  Likely hospital-acquired delirium.  Will obtain CT head to rule out structural brain disease.  Will start on delirium precautions and Seroquel nightly.  Chest pain: PE and aortic dissection ruled out.  Echo shows normal ejection fraction and no wall motion abnormality.  She did not complain of chest pain to me today.  Postmenopausal vaginal bleeding: Pelvic ultrasound shows 7 mm thickness.  Seen by OB/GYN.  They recommended outpatient endometrial sampling.  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  Diabetes type 2 uncontrolled with hyperglycemia Blood sugar slightly elevated.  Will increase Lantus to 15 units tonight and continue SSI.  HLD -9/6 LDL= 234 -As soon as patient completes treatment for Covid would start patient on Lipitor 40 mg daily.  Essential HTN -Fairly controlled.  Continue Coreg 12.5 mg BID and hydrochlorothiazide and as needed hydralazine.  New onset brief episode of A. fib -Required IV Cardizem but now in sinus rhythm for last few days.  Hypokalemia Resolved  Hypomagnesmia Resolved.  Hypophosphatemia Resolved  Elevated liver enzymes Likely due to COVID-19 pneumonia.  Very slowly improving.   ~~~~~~~~~~~~~~~~~~~~~~~~~~~~~~~~~~~~~~~~~~~~~~~~~~~~~~~~~~~~~~~  DVT prophylaxis: Lovenox.  Code Status: Full Family Communication: Spoke to patient's daughter Carlus Pavlov over the phone and answered several  questions. Disposition Plan: d/c to SNF when bed available.  Status is: Inpatient Remains inpatient appropriate because:Inpatient level of care appropriate due to severity of illness  Dispo: The patient is from: Home              Anticipated d/c is to: Home vs SNF              Anticipated d/c date is: 1 to 2 days              Patient currently is not medically stable to d/c.   ~~~~~~~~~~~~~~~~~~~~~~~~~~~~~~~~~~~~~~~~~~~~~~~~~~~~~~~~~~~~~~~  Subjective:  Patient seen and examined.  Slightly lethargic and confused.  Was able to move all extremities and follow simple commands as well.  Was on room air and looked comfortable.  COVID-19 Labs  Recent Labs    06/21/20 0530 06/22/20 0530 06/23/20 0530  FERRITIN 251 260 297  LDH 299* 291* 261*  CRP 2.6* 1.2* 0.8    Lab Results  Component Value Date   SARSCOV2NAA POSITIVE (A) 06/12/2020   SARSCOV2NAA NEGATIVE 07/31/2019    COVID-19 Labs  Recent Labs    06/21/20 0530 06/22/20 0530 06/23/20 0530  FERRITIN 251 260 297  LDH 299* 291* 261*  CRP 2.6* 1.2* 0.8    Lab Results  Component Value Date   SARSCOV2NAA POSITIVE (A) 06/12/2020   [place] NEGATIVE 07/31/2019   9/13  SpO2: 90 % O2 Flow Rate (L/min): 2 L/min FiO2 (%): 65 % COVID-19 Labs  Recent Labs    06/21/20 0530 06/22/20 0530 06/23/20 0530  FERRITIN 251 260 297  LDH 299* 291* 261*  CRP 2.6* 1.2* 0.8    Lab Results  Component Value Date   SARSCOV2NAA POSITIVE (A) 06/12/2020   [place] NEGATIVE 07/31/2019      Objective: Vitals:   06/22/20 1615 06/22/20 1620 06/22/20 2017 06/23/20 0300  BP:  122/63 (!) 131/96 (!) 137/93  Pulse: (!) 117 94    Resp: (!) _0 Temp:  98.1 F (36.7 C) 98.4 F (36.9 C) 98.4 F (36.9 C)  TempSrc:  Oral Oral Axillary  SpO2: 96% 95% 90%   Weight:    123 kg  Height:        Intake/Output Summary (Last 24 hours) at 06/23/2020 1137 Last data filed at 06/22/2020 1648 Gross per 24 hour  Intake 0 ml   Output 0 ml  Net 0 ml   Filed Weights   06/21/20 0459 06/22/20 0506 06/23/20 0300  Weight: 123.8 kg 123.5 kg 123 kg   Examination: Blood pressure (!) 137/93, pulse 94, temperature 98.4 F (36.9 C), temperature source Axillary, resp. rate 19, height 5' 6" (1.676 m), weight 123 kg, SpO2 90 %.  General exam: Appears calm and comfortable  Respiratory system: Diminished breath sounds bilaterally, respiratory effort normal. Cardiovascular system: S1 & S2 heard, RRR. No JVD, murmurs, rubs, gallops or clicks. No pedal edema. Gastrointestinal system: Abdomen is nondistended, soft and nontender. No organomegaly or masses felt. Normal bowel sounds heard. Central nervous system: Slightly lethargic and disoriented. No focal neurological deficits. Extremities: Symmetric 5 x 5 power. Skin: No rashes, lesions or ulcers.   Data Reviewed:  CBC: Recent Labs  Lab 06/19/20 0412 06/20/20 0601 06/21/20 0530 06/22/20 0530 06/23/20 0530  WBC 16.5* 12.9* 10.6* 10.3 10.6*  NEUTROABS 12.9* 9.8* 7.9* 7.9* 8.3*  HGB 15.2* 15.2* 14.5 14.0 13.8  HCT 45.8 43.9 43.4 42.9 42.3  MCV 82.1 80.0 82.7 84.3 84.3  PLT 349 347 313 257 401   Basic Metabolic Panel: Recent Labs  Lab 06/17/20 0502 06/17/20 0502 06/17/20 1014 06/19/20 0412 06/20/20 0601 06/21/20 0530 06/22/20 0530 06/23/20 0530  NA 141   < >  --  141 139 137 138 136  K 4.6   < >  --  4.2 4.3 4.2 4.4 4.4  CL 107   < >  --  107 105 102 103 102  CO2 23   < >  --  _0 GLUCOSE 99   < >  --  168* 185* 307* 173* 232*  BUN 40*   < >  --  33* 34* 36* 38* 42*  CREATININE 1.10*   < >  --  0.85 0.95 0.88 1.00 0.96  CALCIUM 9.4   < >  --  10.0 9.9 9.7 9.8 10.0  MG 2.6*  --  2.6* 2.3  --   --   --   --   PHOS 3.2  --   --  2.7  --   --   --   --    < > = values in this interval not displayed.   GFR: Estimated Creatinine Clearance: 78.2 mL/min (by C-G formula based on SCr of 0.96 mg/dL). Liver Function Tests: Recent Labs  Lab  06/19/20 0412 06/20/20 0601 06/21/20 0530 06/22/20 0530 06/23/20 0530  AST 40 29 24 34 32  ALT 92* 74* 61* 64* 73*  ALKPHOS 412* 343* 298* 274* 254*  BILITOT 0.8 0.9 0.7 0.7 0.9  PROT 6.6 6.9 6.5 6.2* 6.2*  ALBUMIN 2.8* 2.7* 2.6* 2.6* 2.6*    No results for input(s): HGBA1C in the last 72 hours. CBG: Recent Labs  Lab 06/22/20 0832 06/22/20 1201 06/22/20 1617 06/22/20 2001 06/23/20 0816  GLUCAP 178* 202* 230* 203* 219*   Anemia Panel: Recent Labs    06/22/20 0530 06/23/20 0530  FERRITIN 260 297   Sepsis Labs: No results for input(s): PROCALCITON, LATICACIDVEN in the last 168 hours.  No results found for this or any previous visit (from the past 240 hour(s)).   Scheduled Meds: . vitamin C  500 mg Oral Daily  . carvedilol  12.5 mg Oral BID WC  . Chlorhexidine Gluconate Cloth  6 each Topical Daily  . enoxaparin (LOVENOX) injection  0.5 mg/kg Subcutaneous Q24H  . feeding supplement (NEPRO CARB STEADY)  237 mL Oral TID BM  . hydrochlorothiazide  12.5 mg Oral BID  . influenza vaccine adjuvanted  0.5 mL Intramuscular Tomorrow-1000  . insulin aspart  0-15 Units Subcutaneous TID WC  . insulin aspart  0-5 Units Subcutaneous QHS  . insulin glargine  15 Units Subcutaneous BID  . Ipratropium-Albuterol  1 puff Inhalation Q6H  . melatonin  2.5 mg Oral QHS  . multivitamin with minerals  1 tablet Oral Daily  . predniSONE  50 mg Oral Q breakfast   Followed by  . [START ON 06/26/2020] predniSONE  40 mg Oral Q breakfast   Followed by  . [START ON 06/29/2020] predniSONE  30 mg Oral Q breakfast   Followed by  . [START ON 07/02/2020] predniSONE  20 mg Oral Q breakfast   Followed by  . [START ON 07/05/2020] predniSONE  10 mg Oral Q breakfast  . QUEtiapine  25 mg Oral QHS  . sodium chloride flush  10-40 mL Intracatheter Q12H  . zinc sulfate  220  mg Oral Daily   Continuous Infusions:    LOS: 11 days   Darliss Cheney, MD [facility] If 7PM-7AM, please contact  night-coverage [email or web address] 06/23/2020, 11:37 AM

## 2020-06-23 NOTE — Progress Notes (Signed)
Patient pulled out PICC line.  Catheter measured 42cm which is what is charted by IV team.

## 2020-06-24 DIAGNOSIS — N950 Postmenopausal bleeding: Secondary | ICD-10-CM

## 2020-06-24 LAB — BLOOD GAS, ARTERIAL
Acid-Base Excess: 3.2 mmol/L — ABNORMAL HIGH (ref 0.0–2.0)
Bicarbonate: 26.9 mmol/L (ref 20.0–28.0)
FIO2: 0.36
O2 Saturation: 66.8 %
Patient temperature: 37
pCO2 arterial: 37 mmHg (ref 32.0–48.0)
pH, Arterial: 7.47 — ABNORMAL HIGH (ref 7.350–7.450)
pO2, Arterial: 32 mmHg — CL (ref 83.0–108.0)

## 2020-06-24 LAB — GLUCOSE, CAPILLARY
Glucose-Capillary: 141 mg/dL — ABNORMAL HIGH (ref 70–99)
Glucose-Capillary: 170 mg/dL — ABNORMAL HIGH (ref 70–99)
Glucose-Capillary: 222 mg/dL — ABNORMAL HIGH (ref 70–99)
Glucose-Capillary: 183 mg/dL — ABNORMAL HIGH (ref 70–99)

## 2020-06-24 LAB — LACTATE DEHYDROGENASE: LDH: 296 U/L — ABNORMAL HIGH (ref 98–192)

## 2020-06-24 LAB — FERRITIN: Ferritin: 339 ng/mL — ABNORMAL HIGH (ref 11–307)

## 2020-06-24 LAB — C-REACTIVE PROTEIN: CRP: 0.8 mg/dL (ref <1.0)

## 2020-06-24 MED ORDER — INSULIN GLARGINE 100 UNIT/ML ~~LOC~~ SOLN
20.0000 [IU] | Freq: Two times a day (BID) | SUBCUTANEOUS | Status: DC
  Administered 2020-06-24 – 2020-06-27 (×6): 20 [IU] via SUBCUTANEOUS
  Filled 2020-06-24 (×7): qty 0.2

## 2020-06-24 NOTE — Progress Notes (Signed)
Physical Therapy Treatment Patient Details Name: Chelsea Glass MRN: [id number] DOB: Sep 15, 1956 Today's Date: 06/24/2020    History of Present Illness Chelsea Glass is a 63yoF who comes [facility] 9/5 c AMS, pt had been in bed for 5 days, found soiled. PMH: HTN, DM, OA, obesity.  Pt (+) c COVID, in AF upon arrival.    PT Comments    Pt in bed upon entry, finishing up with RN team. Pt assisted to EOB Max-totalA +2 this date, not really cognitively following commands well enough to assist. Pt does well sitting at EOB, no trunk stability issues, says 'that feels so much better" as she has said at other 2 PT sessions. Attempted leg exercises at EOB with minimal success, mentation much more limiting this date, pt struggling with following simple commands with consistency. Pt presenting with more drowsiness this date as well. Pt assisted back to supine, then sitting up a bit taller in bed (transfer to recliner not a safe option at this time.) Pt has WNL saturation in session with occasional artifact desat as signal strength is lost). Of note, pt now with Rt ear oximeter. Assisted pt with speaking to DTR on phone at end of session, then author called DTR to give rehab updates.   Follow Up Recommendations  SNF;Supervision for mobility/OOB     Equipment Recommendations  None recommended by PT    Recommendations for Other Services       Precautions / Restrictions Precautions Precautions: Fall Precaution Comments: vaginal serosanguinous discharge Restrictions Other Position/Activity Restrictions: monitor HR    Mobility  Bed Mobility Overal bed mobility: Needs Assistance Bed Mobility: Supine to Sit;Sit to Supine     Supine to sit: Total assist;+2 for physical assistance Sit to supine: Total assist;+2 for physical assistance      Transfers Overall transfer level:  (deferred, mentaton worse)                  Ambulation/Gait                 Stairs              Wheelchair Mobility    Modified Rankin (Stroke Patients Only)       Balance                                            Cognition Arousal/Alertness: Awake/alert Behavior During Therapy: Flat affect Overall Cognitive Status: Impaired/Different from baseline                                        Exercises Other Exercises Other Exercises: Seated heel slides at EOB 1x10 bilat attempted, somewhat limited by mentation Other Exercises: Seated LAQ at EOB 1x10 bilat attempted, somewhat limited by mentation Other Exercises: Sitting EOB, intermittent UE support x12 minutes Other Exercises: high fives, down lows, fist bumps, downward bump, upward bump, 1x each; extensive multimodal cues for accuracy (working on dynamic challenge to sitting trunk control and following commands.)    General Comments        Pertinent Vitals/Pain Pain Assessment: No/denies pain    Home Living                      Prior Function  PT Goals (current goals can now be found in the care plan section) Acute Rehab PT Goals Patient Stated Goal: be able to return to DTRs home PT Goal Formulation: With family Time For Goal Achievement: 07/05/20 Potential to Achieve Goals: Fair Progress towards PT goals: Progressing toward goals    Frequency    Min 2X/week      PT Plan Current plan remains appropriate    Co-evaluation              AM-PAC PT "6 Clicks" Mobility   Outcome Measure  Help needed turning from your back to your side while in a flat bed without using bedrails?: Total Help needed moving from lying on your back to sitting on the side of a flat bed without using bedrails?: Total Help needed moving to and from a bed to a chair (including a wheelchair)?: Total Help needed standing up from a chair using your arms (e.g., wheelchair or bedside chair)?: Total Help needed to walk in hospital room?: Total Help needed climbing 3-5  steps with a railing? : Total 6 Click Score: 6    End of Session Equipment Utilized During Treatment: Oxygen Activity Tolerance: Patient tolerated treatment well;No increased pain Patient left: in bed;with call bell/phone within reach;with bed alarm set;Other (comment) (on phone with DTR) Nurse Communication: Mobility status PT Visit Diagnosis: Muscle weakness (generalized) (M62.81);Difficulty in walking, not elsewhere classified (R26.2);Other symptoms and signs involving the nervous system (R29.898)     Time: 1000-1014 PT Time Calculation (min) (ACUTE ONLY): 14 min  Charges:  $Therapeutic Exercise: 8-22 mins                     4:49 PM, 06/24/20 Rosamaria Lints, PT, DPT Physical Therapist - [facility]  [phone] (ASCOM)    Fahim Kats C 06/24/2020, 4:45 PM

## 2020-06-24 NOTE — Care Management Important Message (Signed)
Important Message  Patient Details  Name: Chelsea Glass MRN: [id number] Date of Birth: [birth year]   Medicare Important Message Given:  Yes  Reviewed with daughter, Deloris Deretha Emory.  Copy of Medicare IM sent securely to verified email address provided: [email or web address].     Johnell Comings 06/24/2020, 4:46 PM

## 2020-06-24 NOTE — Progress Notes (Signed)
Pt refused to wear Bipap. Bipap remains at bedside. Placed pt back on Bell, taped SPO2 probe back on pt finger. SPO2 94% on 2L. Pt RN aware.

## 2020-06-24 NOTE — TOC Progression Note (Signed)
Transition of Care [facility]) - Progression Note    Patient Details  Name: Chelsea Glass MRN: [id number] Date of Birth: Apr 09, 1956  [facility] CM/SW Contact  Maree Krabbe, LCSW Phone Number: 06/24/2020, 2:55 PM  Clinical Narrative:   CSW sent updated clinicals to [facility].    Expected Discharge Plan: Skilled Nursing Facility Barriers to Discharge: Continued Medical Work up  Expected Discharge Plan and Services Expected Discharge Plan: Skilled Nursing Facility In-house Referral: NA   Post Acute Care Choice: Skilled Nursing Facility Living arrangements for the past 2 months: Single Family Home                                       Social Determinants of Health (SDOH) Interventions    Readmission Risk Interventions No flowsheet data found.

## 2020-06-24 NOTE — Progress Notes (Signed)
PROGRESS NOTE    Chelsea Glass  [id number]:[id number] DOB: 01-Dec-1955 DOA: 06/12/2020 PCP: [facility], [place]    Brief Narrative:  Chelsea Glass is a 64 y.o. female with medical history significant for hypertension, diabetes mellitus, arthritis, morbid obesity, was Brought to the hospital because of altered mental status. Reportedly, EMS was called because patient had been laying in bed for about 5 days without getting up. Apparently, she had been laying in herfeces and urine. Per report, EMS had been called to the house about 4 days prior to admission but patient had refused transport to the hospital at that time.Brought to the hospital because of altered mental status.  Upon arrival to ED, patient tested positive for COVID-19.  She was in rapid A. fib and for that she was given bolus of IV Cardizem.  She was started on IV dexamethasone and remdesivir and admitted to hospitalist service for severe sepsis. patient met criteria for severe sepsis HR> 90,RR> 20, respiratory distress, lactic acid> 2  Assessment & Plan:   Active Problems:   Pneumonia due to COVID-19 virus   Acute respiratory failure with hypoxia (HCC)   Obesity, Class III, BMI 40-49.9 (morbid obesity) (HCC)   Severe sepsis (HCC)   New onset atrial fibrillation (HCC)   Hypertension   Postmenopausal bleeding Severe sepsis secondary to COVID-19 pneumonia- resolved.  -On admission patient met criteria for severe sepsis HR> 90,RR> 20, respiratory distress, lactic acid> 2.  Sepsis physiology has resolved.  Treat underlying cause as mentioned below. -Lactic acidosis has resolved post fluid resuscitation  Acute respiratory failure with hypoxia/Covid pneumonia COVID-19 Labs  Recent Labs    06/22/20 0530 06/23/20 0530  FERRITIN 260 297  LDH 291* 261*  CRP 1.2* 0.8   Completed remdesivir on 06/16/2020. -Now that she is maintaining her oxygen saturation well over 90% on room air, we will switch from IV Solu-Medrol to  tapering dose of prednisone.  I do not see baricitinib used during this hospitalization on this patient and now that she seems to have improved, I would forego that. --Anticoagulation with Lovenox 62.5 mg subcu every 24. -Vitamin C and zinc per Covid protocol -Incentive spirometry -Respimat QID Patient was encouraged to prone, out of bed to chair, to use incentive spirometry and flutter valve.  Delirium: Patient much more alert today although still and is trying to answer but incoherent.  She is following command and is trying to answer.  We will continue delirium precautions and likely Seroquel.  Chest pain: PE and aortic dissection ruled out.  Echo shows normal ejection fraction and no wall motion abnormality.  She did not complain of chest pain to me today.  Postmenopausal vaginal bleeding: Pelvic ultrasound shows 7 mm thickness.  Seen by OB/GYN.  They recommended outpatient endometrial sampling.  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  Diabetes type 2 uncontrolled with hyperglycemia Blood sugar slightly elevated.  Will increase Lantus 20 units twice daily.  HLD -9/6 LDL= 234 -As soon as patient completes treatment for Covid would start patient on Lipitor 40 mg daily.  Essential HTN -Fairly controlled.  Continue Coreg 12.5 mg BID and hydrochlorothiazide and as needed hydralazine.  New onset brief episode of A. fib -Required IV Cardizem but now in sinus rhythm for last few days.  Hypokalemia Resolved  Hypomagnesmia Resolved.  Hypophosphatemia Resolved  Elevated liver enzymes Likely due to COVID-19 pneumonia.  Very slowly improving.   ~~~~~~~~~~~~~~~~~~~~~~~~~~~~~~~~~~~~~~~~~~~~~~~~~~~~~~~~~~~~~~~  DVT prophylaxis: Lovenox.  Code  Status: Full Family Communication: Spoke to patient's daughter Chelsea Glass over the phone and answered several questions. Disposition Plan: d/c to SNF when bed available.  Status  is: Inpatient Remains inpatient appropriate because:Inpatient level of care appropriate due to severity of illness  Dispo: The patient is from: Home              Anticipated d/c is to: Home vs SNF              Anticipated d/c date is: 1 to 2 days              Patient currently is not medically stable to d/c.   ~~~~~~~~~~~~~~~~~~~~~~~~~~~~~~~~~~~~~~~~~~~~~~~~~~~~~~~~~~~~~~~  Subjective:  Patient seen and examined.  She is definitely more alert but still confused.  Denied having any complaint we looked comfortable.  COVID-19 Labs  Recent Labs    06/22/20 0530 06/23/20 0530  FERRITIN 260 297  LDH 291* 261*  CRP 1.2* 0.8    Lab Results  Component Value Date   SARSCOV2NAA POSITIVE (A) 06/12/2020   SARSCOV2NAA NEGATIVE 07/31/2019    COVID-19 Labs  Recent Labs    06/22/20 0530 06/23/20 0530  FERRITIN 260 297  LDH 291* 261*  CRP 1.2* 0.8    Lab Results  Component Value Date   SARSCOV2NAA POSITIVE (A) 06/12/2020   [place] NEGATIVE 07/31/2019   9/13  SpO2: 95 % O2 Flow Rate (L/min): 2 L/min FiO2 (%): 65 % COVID-19 Labs  Recent Labs    06/22/20 0530 06/23/20 0530  FERRITIN 260 297  LDH 291* 261*  CRP 1.2* 0.8    Lab Results  Component Value Date   SARSCOV2NAA POSITIVE (A) 06/12/2020   SARSCOV2NAA NEGATIVE 07/31/2019      Objective: Vitals:   06/23/20 2022 06/23/20 2221 06/24/20 0434 06/24/20 0831  BP:  (!) 129/101 (!) 145/119 (!) 158/121  Pulse: 95  99 93  Resp: _0 Temp: 98.4 F (36.9 C)  98.4 F (36.9 C) 98.5 F (36.9 C)  TempSrc:   Oral Oral  SpO2: 92%  90% 95%  Weight:   125.8 kg   Height:        Intake/Output Summary (Last 24 hours) at 06/24/2020 1102 Last data filed at 06/23/2020 1804 Gross per 24 hour  Intake --  Output 125 ml  Net -125 ml   Filed Weights   06/22/20 0506 06/23/20 0300 06/24/20 0434  Weight: 123.5 kg 123 kg 125.8 kg   Examination: Blood pressure (!) 158/121, pulse 93, temperature 98.5 F (36.9 C),  temperature source Oral, resp. rate 19, height _1  (1.676 m), weight 125.8 kg, SpO2 95 %.  General exam: Appears calm and comfortable, morbidly obese Respiratory system: Diminished breath sounds. Respiratory effort normal. Cardiovascular system: S1 & S2 heard, RRR. No JVD, murmurs, rubs, gallops or clicks. No pedal edema. Gastrointestinal system: Abdomen is nondistended, soft and nontender. No organomegaly or masses felt. Normal bowel sounds heard. Central nervous system: Alert but not oriented. No focal neurological deficits. Extremities: Symmetric 5 x 5 power. Skin: No rashes, lesions or ulcers.  Data Reviewed:  CBC: Recent Labs  Lab 06/19/20 0412 06/20/20 0601 06/21/20 0530 06/22/20 0530 06/23/20 0530  WBC 16.5* 12.9* 10.6* 10.3 10.6*  NEUTROABS 12.9* 9.8* 7.9* 7.9* 8.3*  HGB 15.2* 15.2* 14.5 14.0 13.8  HCT 45.8 43.9 43.4 42.9 42.3  MCV 82.1 80.0 82.7 84.3 84.3  PLT 349 347 313 257 389   Basic Metabolic Panel: Recent Labs  Lab 06/19/20 0412 06/20/20  0601 06/21/20 0530 06/22/20 0530 06/23/20 0530  NA 141 139 137 138 136  K 4.2 4.3 4.2 4.4 4.4  CL 107 105 102 103 102  CO2 _0 GLUCOSE 168* 185* 307* 173* 232*  BUN 33* 34* 36* 38* 42*  CREATININE 0.85 0.95 0.88 1.00 0.96  CALCIUM 10.0 9.9 9.7 9.8 10.0  MG 2.3  --   --   --   --   PHOS 2.7  --   --   --   --    GFR: Estimated Creatinine Clearance: 79.2 mL/min (by C-G formula based on SCr of 0.96 mg/dL). Liver Function Tests: Recent Labs  Lab 06/19/20 0412 06/20/20 0601 06/21/20 0530 06/22/20 0530 06/23/20 0530  AST 40 29 24 34 32  ALT 92* 74* 61* 64* 73*  ALKPHOS 412* 343* 298* 274* 254*  BILITOT 0.8 0.9 0.7 0.7 0.9  PROT 6.6 6.9 6.5 6.2* 6.2*  ALBUMIN 2.8* 2.7* 2.6* 2.6* 2.6*    No results for input(s): HGBA1C in the last 72 hours. CBG: Recent Labs  Lab 06/23/20 0816 06/23/20 1143 06/23/20 1649 06/23/20 2118 06/24/20 0835  GLUCAP 219* 212* 220* 208* 141*   Anemia Panel: Recent  Labs    06/22/20 0530 06/23/20 0530  FERRITIN 260 297   Sepsis Labs: No results for input(s): PROCALCITON, LATICACIDVEN in the last 168 hours.  No results found for this or any previous visit (from the past 240 hour(s)).   Scheduled Meds: . vitamin C  500 mg Oral Daily  . carvedilol  12.5 mg Oral BID WC  . Chlorhexidine Gluconate Cloth  6 each Topical Daily  . enoxaparin (LOVENOX) injection  0.5 mg/kg Subcutaneous Q24H  . feeding supplement (NEPRO CARB STEADY)  237 mL Oral TID BM  . hydrochlorothiazide  12.5 mg Oral BID  . influenza vaccine adjuvanted  0.5 mL Intramuscular Tomorrow-1000  . insulin aspart  0-15 Units Subcutaneous TID WC  . insulin aspart  0-5 Units Subcutaneous QHS  . insulin glargine  15 Units Subcutaneous BID  . Ipratropium-Albuterol  1 puff Inhalation Q6H  . melatonin  2.5 mg Oral QHS  . multivitamin with minerals  1 tablet Oral Daily  . predniSONE  50 mg Oral Q breakfast   Followed by  . [START ON 06/27/2020] predniSONE  40 mg Oral Q breakfast   Followed by  . [START ON 06/30/2020] predniSONE  30 mg Oral Q breakfast   Followed by  . [START ON 07/03/2020] predniSONE  20 mg Oral Q breakfast   Followed by  . [START ON 07/06/2020] predniSONE  10 mg Oral Q breakfast  . QUEtiapine  25 mg Oral QHS  . sodium chloride flush  10-40 mL Intracatheter Q12H  . zinc sulfate  220 mg Oral Daily   Continuous Infusions:    LOS: 12 days   Darliss Cheney, MD [facility] If 7PM-7AM, please contact night-coverage [email or web address] 06/24/2020, 11:02 AM

## 2020-06-25 LAB — BASIC METABOLIC PANEL
Anion gap: 11 (ref 5–15)
BUN: 36 mg/dL — ABNORMAL HIGH (ref 8–23)
CO2: 23 mmol/L (ref 22–32)
Calcium: 10.3 mg/dL (ref 8.9–10.3)
Chloride: 104 mmol/L (ref 98–111)
Creatinine, Ser: 1.07 mg/dL — ABNORMAL HIGH (ref 0.44–1.00)
GFR calc Af Amer: >60 mL/min (ref >60)
GFR calc non Af Amer: 54 mL/min — ABNORMAL LOW (ref >60)
Glucose, Bld: 98 mg/dL (ref 70–99)
Potassium: 3.9 mmol/L (ref 3.5–5.1)
Sodium: 138 mmol/L (ref 135–145)

## 2020-06-25 LAB — GLUCOSE, CAPILLARY
Glucose-Capillary: 97 mg/dL (ref 70–99)
Glucose-Capillary: 178 mg/dL — ABNORMAL HIGH (ref 70–99)
Glucose-Capillary: 204 mg/dL — ABNORMAL HIGH (ref 70–99)
Glucose-Capillary: 193 mg/dL — ABNORMAL HIGH (ref 70–99)

## 2020-06-25 LAB — C-REACTIVE PROTEIN: CRP: 1 mg/dL — ABNORMAL HIGH (ref <1.0)

## 2020-06-25 LAB — LACTATE DEHYDROGENASE: LDH: 269 U/L — ABNORMAL HIGH (ref 98–192)

## 2020-06-25 LAB — FERRITIN: Ferritin: 379 ng/mL — ABNORMAL HIGH (ref 11–307)

## 2020-06-25 MED ORDER — NYSTATIN 100000 UNIT/ML MT SUSP
5.0000 mL | Freq: Four times a day (QID) | OROMUCOSAL | Status: DC
  Administered 2020-06-25 – 2020-06-27 (×7): 500000 [IU] via ORAL
  Filled 2020-06-25 (×7): qty 5

## 2020-06-25 NOTE — Progress Notes (Signed)
PROGRESS NOTE    Chelsea Glass  QQP:[id number] DOB: [birth year] DOA: 06/12/2020 PCP: [facility] [place]    Brief Narrative:  Chelsea Glass is a 64 y.o. female with medical history significant for hypertension, diabetes mellitus, arthritis, morbid obesity, was Brought to the hospital because of altered mental status. Reportedly, EMS was called because patient had been laying in bed for about 5 days without getting up. Apparently, she had been laying in herfeces and urine. Per report, EMS had been called to the house about 4 days prior to admission but patient had refused transport to the hospital at that time.Brought to the hospital because of altered mental status.  Upon arrival to ED, patient tested positive for COVID-19.  She was in rapid A. fib and for that she was given bolus of IV Cardizem.  She was started on IV dexamethasone and remdesivir and admitted to hospitalist service for severe sepsis. patient met criteria for severe sepsis HR> 90,RR> 20, respiratory distress, lactic acid> 2  Assessment & Plan:   Active Problems:   Pneumonia due to COVID-19 virus   Acute respiratory failure with hypoxia (HCC)   Obesity, Class III, BMI 40-49.9 (morbid obesity) (HCC)   Severe sepsis (HCC)   New onset atrial fibrillation (HCC)   Hypertension   Postmenopausal bleeding Severe sepsis secondary to COVID-19 pneumonia- resolved.  -On admission patient met criteria for severe sepsis HR> 90,RR> 20, respiratory distress, lactic acid> 2.  Sepsis physiology has resolved.  Treat underlying cause as mentioned below. -Lactic acidosis has resolved post fluid resuscitation  Acute respiratory failure with hypoxia/Covid pneumonia COVID-19 Labs  Recent Labs    06/23/20 0530 06/24/20 1032 06/25/20 0701  FERRITIN 297 339* 379*  LDH 261* 296* 269*  CRP 0.8 0.8  --    Completed remdesivir on 06/16/2020. -Continue prednisone.  I do not see baricitinib used during this hospitalization on  this patient and now that she seems to have improved, I would forego that. --Anticoagulation with Lovenox 62.5 mg subcu every 24. -Vitamin C and zinc per Covid protocol -Incentive spirometry -Respimat QID Patient was encouraged to prone, out of bed to chair, to use incentive spirometry and flutter valve.  Delirium: Patient much more alert and partly oriented.  [facility]  She missed the month slightly and she thought it was October.  She also missed the year slightly and she thought it was 2020.  Overall much improvement.  Continue nightly Seroquel and delirium precautions.  Chest pain: PE and aortic dissection ruled out.  Echo shows normal ejection fraction and no wall motion abnormality.  She did not complain of chest pain to me today.  Postmenopausal vaginal bleeding: Pelvic ultrasound shows 7 mm thickness.  Seen by OB/GYN.  They recommended outpatient endometrial sampling.  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  Diabetes type 2 uncontrolled with hyperglycemia Blood sugar fairly controlled.  Will continue Lantus 20 units twice daily.  HLD -9/6 LDL= 234 -As soon as patient completes treatment for Covid would start patient on Lipitor 40 mg daily.  Essential HTN -Fairly controlled.  Continue Coreg 12.5 mg BID and hydrochlorothiazide and as needed hydralazine.  New onset brief episode of A. fib -Required IV Cardizem but now in sinus rhythm for last few days.  Hypokalemia Resolved  Hypomagnesmia Resolved.  Hypophosphatemia Resolved  Elevated liver enzymes Likely due to COVID-19 pneumonia.  Very slowly improving.   ~~~~~~~~~~~~~~~~~~~~~~~~~~~~~~~~~~~~~~~~~~~~~~~~~~~~~~~~~~~~~~~  DVT  prophylaxis: Lovenox.  Code Status: Full Family Communication: Spoke to patient's daughter Doloris over the phone and answered several questions. Disposition Plan: d/c to SNF on  06/27/2020  Status is: Inpatient Remains inpatient appropriate because:Inpatient level of care appropriate due to severity of illness  Dispo: The patient is from: Home              Anticipated d/c is to: SNF              Anticipated d/c date is: 06/27/2020.  Patient may be ready for discharge tomorrow however per my discussion with TOC, her nursing home will accept her only on Monday the earliest.              Patient currently is not medically stable to d/c.   ~~~~~~~~~~~~~~~~~~~~~~~~~~~~~~~~~~~~~~~~~~~~~~~~~~~~~~~~~~~~~~~  Subjective:  Seen and examined.  Alert and partly oriented.  No new complaint.  No shortness of breath.  COVID-19 Labs  Recent Labs    06/23/20 0530 06/24/20 1032 06/25/20 0701  FERRITIN 297 339* 379*  LDH 261* 296* 269*  CRP 0.8 0.8  --     Lab Results  Component Value Date   SARSCOV2NAA POSITIVE (A) 06/12/2020   [place] NEGATIVE 07/31/2019    COVID-19 Labs  Recent Labs    06/23/20 0530 06/24/20 1032 06/25/20 0701  FERRITIN 297 339* 379*  LDH 261* 296* 269*  CRP 0.8 0.8  --     Lab Results  Component Value Date   SARSCOV2NAA POSITIVE (A) 06/12/2020   [place] NEGATIVE 07/31/2019   9/13  SpO2: 99 % O2 Flow Rate (L/min): 2 L/min FiO2 (%): 65 % COVID-19 Labs  Recent Labs    06/23/20 0530 06/24/20 1032 06/25/20 0701  FERRITIN 297 339* 379*  LDH 261* 296* 269*  CRP 0.8 0.8  --     Lab Results  Component Value Date   SARSCOV2NAA POSITIVE (A) 06/12/2020   [place] NEGATIVE 07/31/2019      Objective: Vitals:   06/25/20 0440 06/25/20 0759 06/25/20 1300 06/25/20 1323  BP: 127/90 132/89    Pulse: 92 93    Resp: (!) 22 19  (!) 22  Temp: 98.3 F (36.8 C) 98.2 F (36.8 C)    TempSrc: Oral Oral    SpO2: 99% 100% 99%   Weight:      Height:        Intake/Output Summary (Last 24 hours) at 06/25/2020 1338 Last data filed at 06/25/2020 0441 Gross per 24 hour  Intake 240 ml  Output 900 ml  Net -660 ml   Filed  Weights   06/22/20 0506 06/23/20 0300 06/24/20 0434  Weight: 123.5 kg 123 kg 125.8 kg   Examination: Blood pressure 132/89, pulse 93, temperature 98.2 F (36.8 C), temperature source Oral, resp. rate (!) 22, height 5' 6"  (1.676 m), weight 125.8 kg, SpO2 99 %.  General exam: Appears calm and comfortable, morbidly obese Respiratory system: Diminished breath sounds globally.  Respiratory effort normal. Cardiovascular system: S1 & S2 heard, RRR. No JVD, murmurs, rubs, gallops or clicks. No pedal edema. Gastrointestinal system: Abdomen is nondistended, soft and nontender. No organomegaly or masses felt. Normal bowel sounds heard. Central nervous system: Alert and oriented x1. No focal neurological deficits. Extremities: Symmetric 5 x 5 power. Skin: No rashes, lesions or ulcers.   Data Reviewed:  CBC: Recent Labs  Lab 06/19/20 0412 06/20/20 0601 06/21/20 0530 06/22/20 0530 06/23/20 0530  WBC 16.5* 12.9* 10.6* 10.3 10.6*  NEUTROABS 12.9* 9.8* 7.9* 7.9* 8.3*  HGB 15.2* 15.2* 14.5 14.0 13.8  HCT 45.8 43.9 43.4 42.9 42.3  MCV 82.1 80.0 82.7 84.3 84.3  PLT 349 347 313 257 502   Basic Metabolic Panel: Recent Labs  Lab 06/19/20 0412 06/19/20 0412 06/20/20 0601 06/21/20 0530 06/22/20 0530 06/23/20 0530 06/25/20 0701  NA 141   < > 139 137 138 136 138  K 4.2   < > 4.3 4.2 4.4 4.4 3.9  CL 107   < > 105 102 103 102 104  CO2 25   < > 23 24 25 25 23   GLUCOSE 168*   < > 185* 307* 173* 232* 98  BUN 33*   < > 34* 36* 38* 42* 36*  CREATININE 0.85   < > 0.95 0.88 1.00 0.96 1.07*  CALCIUM 10.0   < > 9.9 9.7 9.8 10.0 10.3  MG 2.3  --   --   --   --   --   --   PHOS 2.7  --   --   --   --   --   --    < > = values in this interval not displayed.   GFR: Estimated Creatinine Clearance: 71.1 mL/min (A) (by C-G formula based on SCr of 1.07 mg/dL (H)). Liver Function Tests: Recent Labs  Lab 06/19/20 0412 06/20/20 0601 06/21/20 0530 06/22/20 0530 06/23/20 0530  AST 40 29 24 34 32  ALT  92* 74* 61* 64* 73*  ALKPHOS 412* 343* 298* 274* 254*  BILITOT 0.8 0.9 0.7 0.7 0.9  PROT 6.6 6.9 6.5 6.2* 6.2*  ALBUMIN 2.8* 2.7* 2.6* 2.6* 2.6*    No results for input(s): HGBA1C in the last 72 hours. CBG: Recent Labs  Lab 06/24/20 1212 06/24/20 1705 06/24/20 2003 06/25/20 0801 06/25/20 1215  GLUCAP 170* 222* 183* 97 178*   Anemia Panel: Recent Labs    06/24/20 1032 06/25/20 0701  FERRITIN 339* 379*   Sepsis Labs: No results for input(s): PROCALCITON, LATICACIDVEN in the last 168 hours.  No results found for this or any previous visit (from the past 240 hour(s)).   Scheduled Meds: . vitamin C  500 mg Oral Daily  . carvedilol  12.5 mg Oral BID WC  . Chlorhexidine Gluconate Cloth  6 each Topical Daily  . enoxaparin (LOVENOX) injection  0.5 mg/kg Subcutaneous Q24H  . feeding supplement (NEPRO CARB STEADY)  237 mL Oral TID BM  . hydrochlorothiazide  12.5 mg Oral BID  . influenza vaccine adjuvanted  0.5 mL Intramuscular Tomorrow-1000  . insulin aspart  0-15 Units Subcutaneous TID WC  . insulin aspart  0-5 Units Subcutaneous QHS  . insulin glargine  20 Units Subcutaneous BID  . Ipratropium-Albuterol  1 puff Inhalation Q6H  . melatonin  2.5 mg Oral QHS  . multivitamin with minerals  1 tablet Oral Daily  . predniSONE  50 mg Oral Q breakfast   Followed by  . [START ON 06/27/2020] predniSONE  40 mg Oral Q breakfast   Followed by  . [START ON 06/30/2020] predniSONE  30 mg Oral Q breakfast   Followed by  . [START ON 07/03/2020] predniSONE  20 mg Oral Q breakfast   Followed by  . [START ON 07/06/2020] predniSONE  10 mg Oral Q breakfast  . QUEtiapine  25 mg Oral QHS  . sodium chloride flush  10-40 mL Intracatheter Q12H  . zinc sulfate  220 mg Oral Daily   Continuous Infusions:    LOS: 13 days   Darliss Cheney,  MD [facility] If 7PM-7AM, please contact night-coverage [email or web address] 06/25/2020, 1:38 PM

## 2020-06-26 LAB — GLUCOSE, CAPILLARY
Glucose-Capillary: 105 mg/dL — ABNORMAL HIGH (ref 70–99)
Glucose-Capillary: 146 mg/dL — ABNORMAL HIGH (ref 70–99)
Glucose-Capillary: 209 mg/dL — ABNORMAL HIGH (ref 70–99)
Glucose-Capillary: 223 mg/dL — ABNORMAL HIGH (ref 70–99)

## 2020-06-26 LAB — C-REACTIVE PROTEIN: CRP: 1.5 mg/dL — ABNORMAL HIGH (ref <1.0)

## 2020-06-26 LAB — FERRITIN: Ferritin: 377 ng/mL — ABNORMAL HIGH (ref 11–307)

## 2020-06-26 LAB — LACTATE DEHYDROGENASE: LDH: 233 U/L — ABNORMAL HIGH (ref 98–192)

## 2020-06-26 MED ORDER — FUROSEMIDE 10 MG/ML IJ SOLN
40.0000 mg | Freq: Once | INTRAMUSCULAR | Status: AC
  Administered 2020-06-26: 40 mg via INTRAVENOUS
  Filled 2020-06-26: qty 4

## 2020-06-26 NOTE — Progress Notes (Signed)
PROGRESS NOTE    Chelsea Glass  DTO:[id number] DOB: 1956/08/20 DOA: 06/12/2020 PCP: [facility] [place]    Brief Narrative:  Chelsea Glass is a 64 y.o. female with medical history significant for hypertension, diabetes mellitus, arthritis, morbid obesity, was Brought to the hospital because of altered mental status. Reportedly, EMS was called because patient had been laying in bed for about 5 days without getting up. Apparently, she had been laying in herfeces and urine. Per report, EMS had been called to the house about 4 days prior to admission but patient had refused transport to the hospital at that time.Brought to the hospital because of altered mental status.  Upon arrival to ED, patient tested positive for COVID-19.  She was in rapid A. fib and for that she was given bolus of IV Cardizem.  She was started on IV dexamethasone and remdesivir and admitted to hospitalist service for severe sepsis. patient met criteria for severe sepsis HR> 90,RR> 20, respiratory distress, lactic acid> 2  Assessment & Plan:   Active Problems:   Pneumonia due to COVID-19 virus   Acute respiratory failure with hypoxia (HCC)   Obesity, Class III, BMI 40-49.9 (morbid obesity) (HCC)   Severe sepsis (HCC)   New onset atrial fibrillation (HCC)   Hypertension   Postmenopausal bleeding Severe sepsis secondary to COVID-19 pneumonia- resolved.  -On admission patient met criteria for severe sepsis HR> 90,RR> 20, respiratory distress, lactic acid> 2.  Sepsis physiology has resolved.  Treat underlying cause as mentioned below. -Lactic acidosis has resolved post fluid resuscitation  Acute respiratory failure with hypoxia/Covid pneumonia COVID-19 Labs  Recent Labs    06/24/20 1032 06/25/20 0701 06/26/20 0345  FERRITIN 339* 379* 377*  LDH 296* 269* 233*  CRP 0.8 1.0*  --    Completed remdesivir on 06/16/2020. -Continue prednisone.  I do not see baricitinib used during this hospitalization on  this patient and now that she seems to have improved, I would forego that. --Anticoagulation with Lovenox 62.5 mg subcu every 24. -Vitamin C and zinc per Covid protocol -Incentive spirometry -Respimat QID Patient was encouraged to prone, out of bed to chair, to use incentive spirometry and flutter valve.  Delirium: Continues to improve.  She is alert and oriented x2 today.  Continue nightly Seroquel and delirium precautions.  Chest pain: PE and aortic dissection ruled out.  Echo shows normal ejection fraction and no wall motion abnormality.  She did not complain of chest pain to me today.  Postmenopausal vaginal bleeding: Pelvic ultrasound shows 7 mm thickness.  Seen by OB/GYN.  They recommended outpatient endometrial sampling.  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  Diabetes type 2 uncontrolled with hyperglycemia Blood sugar fairly controlled.  Will continue Lantus 20 units twice daily.  HLD -9/6 LDL= 234 -As soon as patient completes treatment for Covid would start patient on Lipitor 40 mg daily.  Essential HTN -Fairly controlled.  Continue Coreg 12.5 mg BID and hydrochlorothiazide and as needed hydralazine.  New onset brief episode of A. fib -Required IV Cardizem but now in sinus rhythm for last few days.  Hypokalemia Resolved  Hypomagnesmia Resolved.  Hypophosphatemia Resolved  Elevated liver enzymes Likely due to COVID-19 pneumonia.  Very slowly improving.   ~~~~~~~~~~~~~~~~~~~~~~~~~~~~~~~~~~~~~~~~~~~~~~~~~~~~~~~~~~~~~~~  DVT prophylaxis: Lovenox.  Code Status: Full Family Communication: Spoke to patient's daughter Chelsea Glass over the phone  Disposition Plan: d/c to SNF on 06/27/2020  Status is: Inpatient Remains inpatient appropriate because:Inpatient level of care  appropriate due to severity of illness  Dispo: The patient is from: Home              Anticipated d/c is to: SNF               Anticipated d/c date is: 06/27/2020. per my discussion with TOC, her nursing home will accept her only on Monday the earliest.              Patient currently is medically stable to d/c.   ~~~~~~~~~~~~~~~~~~~~~~~~~~~~~~~~~~~~~~~~~~~~~~~~~~~~~~~~~~~~~~~  Subjective:  Seen and examined.  Alert and oriented x2.  She has no complaints.  COVID-19 Labs  Recent Labs    06/24/20 1032 06/25/20 0701 06/26/20 0345  FERRITIN 339* 379* 377*  LDH 296* 269* 233*  CRP 0.8 1.0*  --     Lab Results  Component Value Date   SARSCOV2NAA POSITIVE (A) 06/12/2020   [place] NEGATIVE 07/31/2019    COVID-19 Labs  Recent Labs    06/24/20 1032 06/25/20 0701 06/26/20 0345  FERRITIN 339* 379* 377*  LDH 296* 269* 233*  CRP 0.8 1.0*  --     Lab Results  Component Value Date   SARSCOV2NAA POSITIVE (A) 06/12/2020   [place] NEGATIVE 07/31/2019   9/13  SpO2: 92 % O2 Flow Rate (L/min): 2 L/min FiO2 (%): 65 % COVID-19 Labs  Recent Labs    06/24/20 1032 06/25/20 0701 06/26/20 0345  FERRITIN 339* 379* 377*  LDH 296* 269* 233*  CRP 0.8 1.0*  --     Lab Results  Component Value Date   SARSCOV2NAA POSITIVE (A) 06/12/2020   SARSCOV2NAA NEGATIVE 07/31/2019      Objective: Vitals:   06/26/20 1000 06/26/20 1019 06/26/20 1131 06/26/20 1200  BP:    117/88  Pulse:  (!) 101  100  Resp: 17   (!) 21  Temp:      TempSrc:      SpO2:   90% 92%  Weight:      Height:        Intake/Output Summary (Last 24 hours) at 06/26/2020 1228 Last data filed at 06/26/2020 0043 Gross per 24 hour  Intake 120 ml  Output 500 ml  Net -380 ml   Filed Weights   06/23/20 0300 06/24/20 0434 06/26/20 0447  Weight: 123 kg 125.8 kg 121 kg   Examination: Blood pressure 117/88, pulse 100, temperature 97.8 F (36.6 C), temperature source Oral, resp. rate (!) 21, height 5' 6"  (1.676 m), weight 121 kg, SpO2 92 %.  General exam: Appears calm and comfortable, morbidly obese Respiratory system: Diminished  breath sounds bilaterally. Respiratory effort normal. Cardiovascular system: S1 & S2 heard, RRR. No JVD, murmurs, rubs, gallops or clicks.  +1 pitting edema bilateral lower extremity Gastrointestinal system: Abdomen is nondistended, soft and nontender. No organomegaly or masses felt. Normal bowel sounds heard. Central nervous system: Alert and oriented. No focal neurological deficits. Extremities: Symmetric 5 x 5 power. Skin: No rashes, lesions or ulcers.  Psychiatry: Judgement and insight appear normal. Mood & affect appropriate.   Data Reviewed:  CBC: Recent Labs  Lab 06/20/20 0601 06/21/20 0530 06/22/20 0530 06/23/20 0530  WBC 12.9* 10.6* 10.3 10.6*  NEUTROABS 9.8* 7.9* 7.9* 8.3*  HGB 15.2* 14.5 14.0 13.8  HCT 43.9 43.4 42.9 42.3  MCV 80.0 82.7 84.3 84.3  PLT 347 313 257 542   Basic Metabolic Panel: Recent Labs  Lab 06/20/20 0601 06/21/20 0530 06/22/20 0530 06/23/20 0530 06/25/20 0701  NA 139 137 138 136 138  K 4.3 4.2 4.4 4.4 3.9  CL 105 102 103 102 104  CO2 23 24 25 25 23   GLUCOSE 185* 307* 173* 232* 98  BUN 34* 36* 38* 42* 36*  CREATININE 0.95 0.88 1.00 0.96 1.07*  CALCIUM 9.9 9.7 9.8 10.0 10.3   GFR: Estimated Creatinine Clearance: 69.5 mL/min (A) (by C-G formula based on SCr of 1.07 mg/dL (H)). Liver Function Tests: Recent Labs  Lab 06/20/20 0601 06/21/20 0530 06/22/20 0530 06/23/20 0530  AST 29 24 34 32  ALT 74* 61* 64* 73*  ALKPHOS 343* 298* 274* 254*  BILITOT 0.9 0.7 0.7 0.9  PROT 6.9 6.5 6.2* 6.2*  ALBUMIN 2.7* 2.6* 2.6* 2.6*    No results for input(s): HGBA1C in the last 72 hours. CBG: Recent Labs  Lab 06/25/20 1215 06/25/20 1630 06/25/20 2008 06/26/20 0844 06/26/20 1227  GLUCAP 178* 204* 193* 105* 146*   Anemia Panel: Recent Labs    06/25/20 0701 06/26/20 0345  FERRITIN 379* 377*   Sepsis Labs: No results for input(s): PROCALCITON, LATICACIDVEN in the last 168 hours.  No results found for this or any previous visit (from the  past 240 hour(s)).   Scheduled Meds:  vitamin C  500 mg Oral Daily   carvedilol  12.5 mg Oral BID WC   Chlorhexidine Gluconate Cloth  6 each Topical Daily   enoxaparin (LOVENOX) injection  0.5 mg/kg Subcutaneous Q24H   feeding supplement (NEPRO CARB STEADY)  237 mL Oral TID BM   hydrochlorothiazide  12.5 mg Oral BID   influenza vaccine adjuvanted  0.5 mL Intramuscular Tomorrow-1000   insulin aspart  0-15 Units Subcutaneous TID WC   insulin aspart  0-5 Units Subcutaneous QHS   insulin glargine  20 Units Subcutaneous BID   Ipratropium-Albuterol  1 puff Inhalation Q6H   melatonin  2.5 mg Oral QHS   multivitamin with minerals  1 tablet Oral Daily   nystatin  5 mL Oral QID   [START ON 06/27/2020] predniSONE  40 mg Oral Q breakfast   Followed by   Derrill Memo ON 06/30/2020] predniSONE  30 mg Oral Q breakfast   Followed by   Derrill Memo ON 07/03/2020] predniSONE  20 mg Oral Q breakfast   Followed by   Derrill Memo ON 07/06/2020] predniSONE  10 mg Oral Q breakfast   QUEtiapine  25 mg Oral QHS   sodium chloride flush  10-40 mL Intracatheter Q12H   zinc sulfate  220 mg Oral Daily   Continuous Infusions:    LOS: 14 days   Darliss Cheney, MD [facility] If 7PM-7AM, please contact night-coverage [email or web address] 06/26/2020, 12:28 PM

## 2020-06-26 NOTE — TOC Progression Note (Signed)
Transition of Care [facility]) - Progression Note    Patient Details  Name: Chelsea Glass MRN: [id number] Date of Birth: 09/05/1956  [facility]) CM/SW Contact  Eilleen Kempf, [place] Phone Number: 06/26/2020, 1:40 PM  Clinical Narrative:    Physician plans discharge to SNF ([facility]) Monday 9/20.CSW received updated authorization starting 9/19-9/22 #[id number]     Expected Discharge Plan: Skilled Nursing Facility Barriers to Discharge: Continued Medical Work up  Expected Discharge Plan and Services Expected Discharge Plan: Skilled Nursing Facility In-house Referral: NA   Post Acute Care Choice: Skilled Nursing Facility Living arrangements for the past 2 months: Single Family Home                                       Social Determinants of Health (SDOH) Interventions    Readmission Risk Interventions No flowsheet data found.

## 2020-06-27 LAB — CBC WITH DIFFERENTIAL/PLATELET
Abs Immature Granulocytes: 0.37 10*3/uL — ABNORMAL HIGH (ref 0.00–0.07)
Basophils Absolute: 0 10*3/uL (ref 0.0–0.1)
Basophils Relative: 0 %
Eosinophils Absolute: 0 10*3/uL (ref 0.0–0.5)
Eosinophils Relative: 0 %
HCT: 44.4 % (ref 36.0–46.0)
Hemoglobin: 14.9 g/dL (ref 12.0–15.0)
Immature Granulocytes: 2 %
Lymphocytes Relative: 27 %
Lymphs Abs: 4.8 10*3/uL — ABNORMAL HIGH (ref 0.7–4.0)
MCH: 27.3 pg (ref 26.0–34.0)
MCHC: 33.6 g/dL (ref 30.0–36.0)
MCV: 81.5 fL (ref 80.0–100.0)
Monocytes Absolute: 2 10*3/uL — ABNORMAL HIGH (ref 0.1–1.0)
Monocytes Relative: 11 %
Neutro Abs: 10.5 10*3/uL — ABNORMAL HIGH (ref 1.7–7.7)
Neutrophils Relative %: 60 %
Platelets: 246 10*3/uL (ref 150–400)
RBC: 5.45 MIL/uL — ABNORMAL HIGH (ref 3.87–5.11)
RDW: 14.2 % (ref 11.5–15.5)
WBC: 17.8 10*3/uL — ABNORMAL HIGH (ref 4.0–10.5)
nRBC: 0 % (ref 0.0–0.2)

## 2020-06-27 LAB — BASIC METABOLIC PANEL
Anion gap: 14 (ref 5–15)
BUN: 43 mg/dL — ABNORMAL HIGH (ref 8–23)
CO2: 24 mmol/L (ref 22–32)
Calcium: 10.4 mg/dL — ABNORMAL HIGH (ref 8.9–10.3)
Chloride: 100 mmol/L (ref 98–111)
Creatinine, Ser: 1.13 mg/dL — ABNORMAL HIGH (ref 0.44–1.00)
GFR calc Af Amer: 59 mL/min — ABNORMAL LOW (ref >60)
GFR calc non Af Amer: 51 mL/min — ABNORMAL LOW (ref >60)
Glucose, Bld: 121 mg/dL — ABNORMAL HIGH (ref 70–99)
Potassium: 3.4 mmol/L — ABNORMAL LOW (ref 3.5–5.1)
Sodium: 138 mmol/L (ref 135–145)

## 2020-06-27 LAB — GLUCOSE, CAPILLARY: Glucose-Capillary: 125 mg/dL — ABNORMAL HIGH (ref 70–99)

## 2020-06-27 LAB — MAGNESIUM: Magnesium: 2.1 mg/dL (ref 1.7–2.4)

## 2020-06-27 MED ORDER — GLIPIZIDE ER 10 MG PO TB24
10.0000 mg | ORAL_TABLET | Freq: Every day | ORAL | 0 refills | Status: DC
Start: 2020-06-27 — End: 2020-12-04

## 2020-06-27 MED ORDER — PREDNISONE 10 MG PO TABS: ORAL_TABLET | ORAL | 0 refills | Status: DC

## 2020-06-27 NOTE — TOC Transition Note (Signed)
Transition of Care [facility]) - CM/SW Discharge Note   Patient Details  Name: Chelsea Glass MRN: [id number] Date of Birth: Dec 18, 1955  Transition of Care [facility]) CM/SW Contact:  Maree Krabbe, LCSW Phone Number: 06/27/2020, 10:32 AM   Clinical Narrative:   Clinical Social Worker facilitated patient discharge including contacting patient family and facility to confirm patient discharge plans.  Clinical information faxed to facility and family agreeable with plan.  CSW arranged ambulance transport via [facility] to [facility] (room 610).  RN to call [phone] for report prior to discharge.  Clinical Social Worker will sign off for now as social work intervention is no longer needed. Please consult [place] again if new need arises.  [place], [place] [phone]     Final next level of care: Skilled Nursing Facility Barriers to Discharge: No Barriers Identified   Patient Goals and CMS Choice Patient states their goals for this hospitalization and ongoing recovery are:: for pt to get better   Choice offered to / list presented to : Adult Children  Discharge Placement              Patient chooses bed at: [facility] [place] Patient to be transferred to facility by: [facility]   Patient and family notified of of transfer: 06/27/20  Discharge Plan and Services In-house Referral: NA   Post Acute Care Choice: Skilled Nursing Facility                               Social Determinants of Health (SDOH) Interventions     Readmission Risk Interventions No flowsheet data found.

## 2020-06-27 NOTE — Discharge Summary (Signed)
Physician Discharge Summary  Chelsea Glass IHK:[id number] DOB: 11-28-1955 DOA: 06/12/2020  PCP: [facility] [place] date: 06/12/2020 Discharge date: 06/27/2020  Admitted From: Home Disposition: SNF  Recommendations for Outpatient Follow-up:  1. Follow up with PCP in 1-2 weeks  2. Follow-up with OB/GYN for endometrial sampling in 1 to 2 weeks 3. Please obtain BMP/CBC in one week 4. Please follow up with your PCP on the following pending results: Unresulted Labs (From admission, onward)          Start     Ordered   06/27/20 0500  CBC with Differential/Platelet  Tomorrow morning,   R       Question:  Specimen collection method  Answer:  Lab=Lab collect   06/26/20 0749   06/27/20 7564  Basic metabolic panel  Tomorrow morning,   R       Question:  Specimen collection method  Answer:  Lab=Lab collect   06/26/20 0749   06/27/20 0500  Magnesium  Tomorrow morning,   R       Question:  Specimen collection method  Answer:  Lab=Lab collect   06/26/20 0749           Home Health: None Equipment/Devices: None  Discharge Condition: Stable CODE STATUS: Full code Diet recommendation: Cardiac  Subjective: Seen and examined.  Feels much better.  She is completely alert and oriented.  Denied any shortness of breath or any other complaint.  Brief/Interim Summary: Chelsea Thompsonis a 64 y.o.femalewith medical history significant for hypertension, diabetes mellitus, arthritis, morbid obesity, was Brought to the hospital because of altered mental status. Reportedly, EMS was called because patient had been laying in bed for about 5 days without getting up. Apparently, she had been laying in herfeces and urine. Per report, EMS had been called to the house about 4 days prior to admission but patient had refused transport to the hospital at that time.Brought to the hospital because of altered mental status.  Upon arrival to ED, patient tested positive for COVID-19.  She was in  rapid A. fib and for that she was given bolus of IV Cardizem.  She was started on IV dexamethasone and remdesivir and admitted to hospitalist service for severe sepsis. patient met criteria for severe sepsis HR> 90,RR> 20, respiratory distress, lactic acid> 2.  Her sepsis parameters improved.  Eventually she completed her remdesivir therapy on 06/16/2020.  She was not given baricitinib.  During this hospitalization, she also complained of chest pain.  She was ruled out of MI, PE and aortic dissection.  Echo showed normal ejection fraction.  Soon after starting IV Cardizem, patient converted back to sinus rhythm and remained in sinus rhythm for rest of her hospitalization and for that reason, anticoagulation was not started.  Patient's diabetes was managed with Lantus.  She also had vaginal bleeding.  OB/GYN was consulted and they recommended endometrial sampling as outpatient.  About 3 days ago, patient developed hospital-acquired delirium.  This was treated with 3 medications and nightly Seroquel.  Over the last 3 days, she continued to improve and today, she is alert and oriented and her delirium has resolved.  She was seen by PT OT couple days ago who recommended SNF which has been arranged for her so patient is being discharged in stable condition.  She will be discharged on tapering dose of prednisone.  I discussed discharge plan with patient's daughter while in patient's room as she was already talking to her daughter over the phone.  Of  note, her hemoglobin A1c was 8.1.  She was only on Metformin.  She required Lantus 20 units twice daily here to control her blood sugar.  At discharge, I am discharging her on glipizide ER 10 mg p.o. daily and resuming Metformin.  Discharge Diagnoses:  Active Problems:   Pneumonia due to COVID-19 virus   Acute respiratory failure with hypoxia (HCC)   Obesity, Class III, BMI 40-49.9 (morbid obesity) (HCC)   Severe sepsis (HCC)   New onset atrial fibrillation (HCC)    Hypertension   Postmenopausal bleeding    Discharge Instructions  Discharge Instructions    Discharge patient   Complete by: As directed    Discharge disposition: 03-Skilled [facility]   Discharge patient date: 06/27/2020     Allergies as of 06/27/2020      Reactions   Lisinopril Swelling      Medication List    STOP taking these medications   chlorthalidone 25 MG tablet Commonly known as: HYGROTON   hydrOXYzine 25 MG tablet Commonly known as: ATARAX/VISTARIL   meloxicam 15 MG tablet Commonly known as: MOBIC   methocarbamol 500 MG tablet Commonly known as: ROBAXIN   spironolactone 50 MG tablet Commonly known as: ALDACTONE     TAKE these medications   carvedilol 12.5 MG tablet Commonly known as: Coreg Take 1 tablet (12.5 mg total) by mouth 2 (two) times daily with a meal.   diphenhydrAMINE 25 mg capsule Commonly known as: BENADRYL Take 1 capsule (25 mg total) by mouth every 4 (four) hours as needed.   glipiZIDE 10 MG 24 hr tablet Commonly known as: GLUCOTROL XL Take 1 tablet (10 mg total) by mouth daily with breakfast.   metFORMIN 500 MG 24 hr tablet Commonly known as: GLUCOPHAGE-XR Take 1,000 mg by mouth daily with breakfast. What changed: Another medication with the same name was removed. Continue taking this medication, and follow the directions you see here.   predniSONE 10 MG tablet Commonly known as: DELTASONE Take 4 tablets (40 mg) p.o. daily for 2 days starting 06/28/2020, followed by 3 tablets (30 mg) p.o. daily for 3 days starting 06/30/2020, followed by 2 tablets (20 mg) p.o. daily for 3 days starting 07/03/2020 followed by 1 tablet p.o. daily for 3 days starting 07/03/2020       Contact information for follow-up providers    [facility], [place]. Schedule an appointment as soon as possible for a visit in 2 week(s).   Why: Hospital folow up and endometrial biopsy/pap Contact information: [place]  [id number], [facility] Follow up in 1 week(s).   Contact information: [place] [address] [phone]            Contact information for after-discharge care    Destination    [facility] Preferred SNF .   Service: Skilled Nursing Contact information: [place] [phone]                 Allergies  Allergen Reactions  . Lisinopril Swelling    Consultations: None   Procedures/Studies: CT HEAD WO CONTRAST  Result Date: 06/23/2020 CLINICAL DATA:  Delirium EXAM: CT HEAD WITHOUT CONTRAST TECHNIQUE: Contiguous axial images were obtained from the base of the skull through the vertex without intravenous contrast. COMPARISON:  06/12/2020 head CT and prior. FINDINGS: Brain: No acute infarct or intracranial hemorrhage. 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 No midline shift, ventriculomegaly or extra-axial fluid collection. Vascular: No hyperdense vessel. Bilateral skull base calcifications. Skull: Negative for fracture or focal lesion. Sinuses/Orbits: Normal orbits. Clear paranasal sinuses. No mastoid effusion. Other: None. IMPRESSION: No acute intracranial process. Electronically Signed   By: Primitivo Gauze M.D.   On: 06/23/2020 13:02   CT Head Wo Contrast  Result Date: 06/12/2020 CLINICAL DATA:  Mental status change. EXAM: CT HEAD WITHOUT CONTRAST TECHNIQUE: Contiguous axial images were obtained from the base of the skull through the vertex without intravenous contrast. COMPARISON:  None. FINDINGS: Brain: No evidence of acute infarction, hemorrhage, hydrocephalus, extra-axial collection or mass lesion/mass effect. Vascular: Atherosclerotic calcifications of the intra cavernous carotid arteries. Skull: Normal. Negative for fracture or focal lesion. Sinuses/Orbits: No acute finding. Other: None. IMPRESSION: No acute intracranial abnormality.  Electronically Signed   By: Fidela Salisbury M.D.   On: 06/12/2020 17:55   CT ANGIO CHEST PE W OR WO CONTRAST  Result Date: 06/21/2020 CLINICAL DATA:  Respiratory failure.  COVID-19 positive EXAM: CT ANGIOGRAPHY CHEST WITH CONTRAST TECHNIQUE: Multidetector CT imaging of the chest was performed using the standard protocol during bolus administration of intravenous contrast. Multiplanar CT image reconstructions and MIPs were obtained to evaluate the vascular anatomy. CONTRAST:  76m OMNIPAQUE IOHEXOL 350 MG/ML SOLN COMPARISON:  Chest radiograph June 17, 2020 FINDINGS: Cardiovascular: There is no demonstrable pulmonary embolus. There is no appreciable thoracic aortic aneurysm or dissection. A small focus of calcification is noted in the left subclavian artery. There is also calcification in the proximal right subclavian artery. Note that the right innominate and left common carotid arteries arise as a common trunk, an anatomic variant. There are foci of aortic atherosclerosis. There are foci of coronary artery calcification. There is no pericardial effusion or pericardial thickening. Central catheter tip is in the distal superior vena cava near the cavoatrial junction. Mediastinum/Nodes: There is a mass arising from the right lobe of the thyroid measuring 2.9 x 2.8 cm. There is no appreciable thoracic adenopathy. No esophageal lesions are evident. Lungs/Pleura: There is extensive multifocal airspace opacity throughout the lungs diffusely. There are foci of consolidation in each lung base as well as in a portion of the posterior segment right upper lobe. No pleural effusions are appreciable. Upper Abdomen: Visualized upper abdominal structures appear unremarkable. Musculoskeletal: Degenerative changes noted in the thoracic spine. There is lower thoracic dextroscoliosis. There are no blastic or lytic bone lesions. No evident chest wall lesions. Review of the MIP images confirms the above findings. IMPRESSION:  1. No demonstrable pulmonary embolus. No thoracic aortic aneurysm or dissection. There is aortic atherosclerosis as well as foci of great vessel and coronary artery calcification. 2. Multifocal pneumonia. Suspect atypical organism pneumonia. Areas of consolidation also noted in the lung bases and right upper lobe posteriorly. There may be a degree of bacterial superinfection in these areas. 3.  No adenopathy appreciable. 4. 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 Recommend thyroid UKoreaper consensus guidelines. (Ref: J Am Coll Radiol. 2015 Feb;12(2): 143-50) Aortic Atherosclerosis (ICD10-I70.0). Electronically Signed   By: WLowella GripIII M.D.   On: 06/21/2020 17:32   [place]LVIS (TRANSABDOMINAL ONLY)  Result Date: 06/21/2020 CLINICAL DATA:  Vaginal bleeding EXAM: TRANSABDOMINAL ULTRASOUND OF PELVIS TECHNIQUE: Transabdominal ultrasound examination of the pelvis was performed including evaluation of the uterus, ovaries, adnexal regions, and pelvic cul-de-sac. COMPARISON:  None. FINDINGS: Uterus Measurements: 9.7 x 4.8 x 5.0 cm = volume: 123  mL. The uterus has a diffusely inhomogeneous appearance consistent with extensive leiomyomatous change. The largest leiomyoma is in the leftward aspect of the uterus, partially calcified, measuring 3.5 x 3.5 x 2.8 cm. Scattered smaller calcifications noted elsewhere. Endometrium Thickness: 7 mm.  No focal abnormality visualized. Right ovary Measurements: 1.8 x 0.9 x 1.5 cm = volume: 1.3 mL. Normal appearance/no adnexal mass. Left ovary Left ovary could not be visualized. No left-sided pelvic mass. Other findings: No abnormal free fluid. IMPRESSION: 1. Leiomyomatous uterus. Largest leiomyoma is largely calcified, measuring 3.5 x 3.5 x 2.8 cm. No endometrial thickening evident. 2. Right ovary appears normal for postmenopausal state. Left ovary cannot be visualized. No left-sided pelvic mass evident. No free pelvic fluid. Electronically  Signed   By: Lowella Grip III M.D.   On: 06/21/2020 14:57   DG Chest Port 1 View  Result Date: 06/17/2020 CLINICAL DATA:  Respiratory failure with hypoxia, acute hypoxemic respiratory failure secondary to COVID-19 EXAM: PORTABLE CHEST 1 VIEW COMPARISON:  Portable exam 0809 hours compared to 06/12/2020 FINDINGS: Normal heart size, mediastinal contours, and pulmonary vascularity. Patchy airspace infiltrates bilaterally consistent with multifocal pneumonia and COVID-19. Infiltrates have increased since previous exam. No pleural effusion or pneumothorax. Scoliosis and degenerative disc disease changes of thoracic spine with additional BILATERAL glenohumeral degenerative changes. IMPRESSION: Increased patchy BILATERAL pulmonary infiltrates consistent with multifocal pneumonia and COVID-19 infection. Electronically Signed   By: Lavonia Dana M.D.   On: 06/17/2020 08:28   DG Chest Portable 1 View  Result Date: 06/12/2020 CLINICAL DATA:  Tachypnea and hypoxia.  Hyperglycemia. EXAM: PORTABLE CHEST 1 VIEW COMPARISON:  10/29/2018 FINDINGS: Low lung volumes are present, causing crowding of the pulmonary vasculature. Chronic elevation of left hemidiaphragm. Patchy abnormal airspace opacities particularly in the right mid lung and left lung base suspicious for bilateral pneumonia. Dextroconvex thoracic scoliosis. Heart size within normal limits given the projection. IMPRESSION: 1. Patchy abnormal airspace opacities in the right mid lung and left lung base suspicious for bilateral pneumonia. 2. Low lung volumes. 3. Chronic elevation of the left hemidiaphragm. 4. Dextroconvex thoracic scoliosis. Electronically Signed   By: Van Clines M.D.   On: 06/12/2020 16:04   ECHOCARDIOGRAM COMPLETE  Result Date: 06/21/2020    ECHOCARDIOGRAM REPORT   Patient Name:   DELLAMAE ROSAMILIA Date of Exam: 06/21/2020 Medical Rec #:  [id number]         Height:       66.0 in Accession #:    [id number]        Weight:       272.9 lb Date  of Birth:  October 30, 1955        BSA:          2.282 m Patient Age:    64 years          BP:           140/86 mmHg Patient Gender: F                 HR:           91 bpm. Exam Location:  [facility] Procedure: 2D Echo, Cardiac Doppler and Color Doppler Indications:     Chest pain 786.50  History:         Patient has no prior history of Echocardiogram examinations.                  Risk Factors:Hypertension and Diabetes.  Sonographer:     Sherrie Sport RDCS (AE) Referring Phys:  [phone]  EKTA V PATEL Diagnosing Phys: Nelva Bush MD  Sonographer Comments: Technically difficult study due to poor echo windows, no apical window and no subcostal window. IMPRESSIONS  1. Left ventricular ejection fraction, by estimation, is > 55%. The left ventricle has normal function. Left ventricular endocardial border not optimally defined to evaluate regional wall motion. There is moderate left ventricular hypertrophy. Left ventricular diastolic function could not be evaluated.  2. Right ventricular systolic function was not well visualized. The right ventricular size is not well visualized. Tricuspid regurgitation signal is inadequate for assessing PA pressure.  3. The mitral valve was not well visualized.  4. The aortic valve was not well visualized. FINDINGS  Left Ventricle: Left ventricular ejection fraction, by estimation, is > 55%. The left ventricle has normal function. Left ventricular endocardial border not optimally defined to evaluate regional wall motion. The left ventricular internal cavity size was normal in size. There is moderate left ventricular hypertrophy. Left ventricular diastolic function could not be evaluated. Right Ventricle: The right ventricular size is not well visualized. No increase in right ventricular wall thickness. Right ventricular systolic function was not well visualized. Tricuspid regurgitation signal is inadequate for assessing PA pressure. Left Atrium: Left atrial size was not well visualized. Right Atrium:  Right atrial size was not well visualized. Pericardium: The pericardium was not well visualized. Mitral Valve: The mitral valve was not well visualized. Tricuspid Valve: The tricuspid valve is not well visualized. Tricuspid valve regurgitation is trivial. Aortic Valve: The aortic valve was not well visualized. Pulmonic Valve: The pulmonic valve was not well visualized. Aorta: The aortic root is normal in size and structure. Pulmonary Artery: The pulmonary artery is not well seen. Venous: The inferior vena cava was not well visualized. IAS/Shunts: The interatrial septum was not well visualized.  LEFT VENTRICLE PLAX 2D LVIDd:         3.09 cm LVIDs:         2.22 cm LV PW:         1.48 cm LV IVS:        1.46 cm LVOT diam:     2.20 cm LVOT Area:     3.80 cm  LEFT ATRIUM         Index LA diam:    3.20 cm 1.40 cm/m   AORTA Ao Root diam: 2.80 cm  SHUNTS Systemic Diam: 2.20 cm Nelva Bush MD Electronically signed by Nelva Bush MD Signature Date/Time: 06/21/2020/4:24:57 PM    Final    [place] EKG SITE RITE  Result Date: 06/19/2020 If Site Rite image not attached, placement could not be confirmed due to current cardiac rhythm.  [place] EKG SITE RITE  Result Date: 06/19/2020 If Site Rite image not attached, placement could not be confirmed due to current cardiac rhythm.     Discharge Exam: Vitals:   06/27/20 0200 06/27/20 0515  BP:  113/89  Pulse: 95 100  Resp: 17 18  Temp:  98.1 F (36.7 C)  SpO2: 95% 94%   Vitals:   06/26/20 2300 06/27/20 0100 06/27/20 0200 06/27/20 0515  BP:    113/89  Pulse: 100 95 95 100  Resp: 17 15 17 18   Temp:    98.1 F (36.7 C)  TempSrc:    Oral  SpO2: 94% 94% 95% 94%  Weight:    116.6 kg  Height:        General: Pt is alert, awake, not in acute distress, morbidly obese Cardiovascular: RRR, S1/S2 +, no rubs, no gallops  Respiratory: CTA bilaterally, no wheezing, no rhonchi Abdominal: Soft, NT, ND, bowel sounds + Extremities: Trace pitting edema bilateral lower  extremity, no cyanosis    The results of significant diagnostics from this hospitalization (including imaging, microbiology, ancillary and laboratory) are listed below for reference.     Microbiology: No results found for this or any previous visit (from the past 240 hour(s)).   Labs: BNP (last 3 results) No results for input(s): BNP in the last 8760 hours. Basic Metabolic Panel: Recent Labs  Lab 06/21/20 0530 06/22/20 0530 06/23/20 0530 06/25/20 0701  NA 137 138 136 138  K 4.2 4.4 4.4 3.9  CL 102 103 102 104  CO2 24 25 25 23   GLUCOSE 307* 173* 232* 98  BUN 36* 38* 42* 36*  CREATININE 0.88 1.00 0.96 1.07*  CALCIUM 9.7 9.8 10.0 10.3   Liver Function Tests: Recent Labs  Lab 06/21/20 0530 06/22/20 0530 06/23/20 0530  AST 24 34 32  ALT 61* 64* 73*  ALKPHOS 298* 274* 254*  BILITOT 0.7 0.7 0.9  PROT 6.5 6.2* 6.2*  ALBUMIN 2.6* 2.6* 2.6*   No results for input(s): LIPASE, AMYLASE in the last 168 hours. No results for input(s): AMMONIA in the last 168 hours. CBC: Recent Labs  Lab 06/21/20 0530 06/22/20 0530 06/23/20 0530  WBC 10.6* 10.3 10.6*  NEUTROABS 7.9* 7.9* 8.3*  HGB 14.5 14.0 13.8  HCT 43.4 42.9 42.3  MCV 82.7 84.3 84.3  PLT 313 257 224   Cardiac Enzymes: No results for input(s): CKTOTAL, CKMB, CKMBINDEX, TROPONINI in the last 168 hours. BNP: Invalid input(s): POCBNP CBG: Recent Labs  Lab 06/25/20 2008 06/26/20 0844 06/26/20 1227 06/26/20 1625 06/26/20 2018  GLUCAP 193* 105* 146* 209* 223*   D-Dimer No results for input(s): DDIMER in the last 72 hours. Hgb A1c No results for input(s): HGBA1C in the last 72 hours. Lipid Profile No results for input(s): CHOL, HDL, LDLCALC, TRIG, CHOLHDL, LDLDIRECT in the last 72 hours. Thyroid function studies No results for input(s): TSH, T4TOTAL, T3FREE, THYROIDAB in the last 72 hours.  Invalid input(s): FREET3 Anemia work up Recent Labs    06/25/20 0701 06/26/20 0345  FERRITIN 379* 377*    Urinalysis    Component Value Date/Time   COLORURINE AMBER (A) 06/12/2020 1521   APPEARANCEUR HAZY (A) 06/12/2020 1521   LABSPEC 1.031 (H) 06/12/2020 1521   PHURINE 5.0 06/12/2020 1521   GLUCOSEU >=500 (A) 06/12/2020 1521   HGBUR SMALL (A) 06/12/2020 1521   BILIRUBINUR NEGATIVE 06/12/2020 1521   KETONESUR 5 (A) 06/12/2020 1521   PROTEINUR >=300 (A) 06/12/2020 1521   NITRITE NEGATIVE 06/12/2020 1521   LEUKOCYTESUR NEGATIVE 06/12/2020 1521   Sepsis Labs Invalid input(s): PROCALCITONIN,  WBC,  LACTICIDVEN Microbiology No results found for this or any previous visit (from the past 240 hour(s)).   Time coordinating discharge: Over 30 minutes  SIGNED:   Darliss Cheney, MD  [facility] 06/27/2020, 7:39 AM  If 7PM-7AM, please contact night-coverage [email or web address]

## 2020-06-27 NOTE — Discharge Instructions (Signed)
COVID-19: Quarantine vs. Isolation QUARANTINE keeps someone who was in close contact with someone who has COVID-19 away from others. If you had close contact with a person who has COVID-19  Stay home until 14 days after your last contact.  Check your temperature twice a day and watch for symptoms of COVID-19.  If possible, stay away from people who are at higher-risk for getting very sick from COVID-19. ISOLATION keeps someone who is sick or tested positive for COVID-19 without symptoms away from others, even in their own home. If you are sick and think or know you have COVID-19  Stay home until after ? At least 10 days since symptoms first appeared and ? At least 24 hours with no fever without fever-reducing medication and ? Symptoms have improved If you tested positive for COVID-19 but do not have symptoms  Stay home until after ? 10 days have passed since your positive test If you live with others, stay in a specific "sick room" or area and away from other people or animals, including pets. Use a separate bathroom, if available. cdc.gov/coronavirus 04/27/2019 This information is not intended to replace advice given to you by your health care provider. Make sure you discuss any questions you have with your health care provider. Document Revised: 09/10/2019 Document Reviewed: 09/10/2019 Elsevier Patient Education  2020 Elsevier Inc.  

## 2020-06-27 NOTE — Progress Notes (Signed)
Report call to [facility], IV and tele removed. Awaiting EMS for transport.

## 2020-07-21 ENCOUNTER — Ambulatory Visit: Payer: Self-pay | Admitting: Obstetrics and Gynecology

## 2020-09-21 ENCOUNTER — Other Ambulatory Visit: Payer: Self-pay

## 2020-09-21 ENCOUNTER — Emergency Department
Admission: EM | Admit: 2020-09-21 | Discharge: 2020-09-21 | Disposition: A | Discharge status: Home / Self Care | Payer: Medicare Other | Attending: Emergency Medicine | Admitting: Emergency Medicine

## 2020-09-21 DIAGNOSIS — R5383 Other fatigue: Secondary | ICD-10-CM | POA: Diagnosis not present

## 2020-09-21 DIAGNOSIS — R197 Diarrhea, unspecified: Secondary | ICD-10-CM | POA: Insufficient documentation

## 2020-09-21 DIAGNOSIS — R531 Weakness: Secondary | ICD-10-CM | POA: Insufficient documentation

## 2020-09-21 DIAGNOSIS — Z79899 Other long term (current) drug therapy: Secondary | ICD-10-CM | POA: Diagnosis not present

## 2020-09-21 DIAGNOSIS — Z7984 Long term (current) use of oral hypoglycemic drugs: Secondary | ICD-10-CM | POA: Insufficient documentation

## 2020-09-21 DIAGNOSIS — E119 Type 2 diabetes mellitus without complications: Secondary | ICD-10-CM | POA: Diagnosis not present

## 2020-09-21 DIAGNOSIS — I10 Essential (primary) hypertension: Secondary | ICD-10-CM | POA: Diagnosis not present

## 2020-09-21 DIAGNOSIS — Z8616 Personal history of COVID-19: Secondary | ICD-10-CM | POA: Diagnosis not present

## 2020-09-21 DIAGNOSIS — R112 Nausea with vomiting, unspecified: Secondary | ICD-10-CM | POA: Diagnosis not present

## 2020-09-21 LAB — CBC
HCT: 38.8 % (ref 36.0–46.0)
Hemoglobin: 12.6 g/dL (ref 12.0–15.0)
MCH: 28.5 pg (ref 26.0–34.0)
MCHC: 32.5 g/dL (ref 30.0–36.0)
MCV: 87.8 fL (ref 80.0–100.0)
Platelets: 313 10*3/uL (ref 150–400)
RBC: 4.42 MIL/uL (ref 3.87–5.11)
RDW: 17.1 % — ABNORMAL HIGH (ref 11.5–15.5)
WBC: 10.7 10*3/uL — ABNORMAL HIGH (ref 4.0–10.5)
nRBC: 0.2 % (ref 0.0–0.2)

## 2020-09-21 LAB — BASIC METABOLIC PANEL
Anion gap: 12 (ref 5–15)
BUN: 12 mg/dL (ref 8–23)
CO2: 28 mmol/L (ref 22–32)
Calcium: 9.6 mg/dL (ref 8.9–10.3)
Chloride: 100 mmol/L (ref 98–111)
Creatinine, Ser: 0.95 mg/dL (ref 0.44–1.00)
GFR, Estimated: >60 mL/min (ref >60)
Glucose, Bld: 68 mg/dL — ABNORMAL LOW (ref 70–99)
Potassium: 3.3 mmol/L — ABNORMAL LOW (ref 3.5–5.1)
Sodium: 140 mmol/L (ref 135–145)

## 2020-09-21 LAB — CBG MONITORING, ED
Glucose-Capillary: 63 mg/dL — ABNORMAL LOW (ref 70–99)
Glucose-Capillary: 73 mg/dL (ref 70–99)

## 2020-09-21 MED ORDER — ONDANSETRON 4 MG PO TBDP: 4.0000 mg | ORAL_TABLET | Freq: Three times a day (TID) | ORAL | 0 refills | Status: DC | PRN

## 2020-09-21 MED ORDER — ONDANSETRON 4 MG PO TBDP
4.0000 mg | ORAL_TABLET | Freq: Once | ORAL | Status: AC
  Administered 2020-09-21: 20:00:00 4 mg via ORAL
  Filled 2020-09-21: qty 1

## 2020-09-21 NOTE — ED Notes (Signed)
Per daughter pt has not been ambulatory since having covid in sept, and requires assistance.

## 2020-09-21 NOTE — ED Provider Notes (Signed)
[facility] Emergency Department Provider Note   ____________________________________________    I have reviewed the triage vital signs and the nursing notes.   HISTORY  Chief Complaint Weakness     HPI Chelsea Glass is a 64 y.o. female with history of diabetes who presents with complaints of weakness, nausea and vomiting and diarrhea.  Patient reports this morning she felt queasy and had an episode of vomiting, later she had diarrhea.  She reports she is feeling improved now.  She denies abdominal pain.  She reports she has felt somewhat fatigued since having Covid in September, that is unchanged.  No new shortness of breath cough or fevers.  Has not take anything for this  Past Medical History:  Diagnosis Date  . Arthritis   . Diabetes mellitus without complication (HCC)   . Hypertension     Patient Active Problem List   Diagnosis Date Noted  . Postmenopausal bleeding   . Hypertension   . Acute respiratory failure with hypoxia (HCC) 06/13/2020  . Obesity, Class III, BMI 40-49.9 (morbid obesity) (HCC) 06/13/2020  . Severe sepsis (HCC) 06/13/2020  . New onset atrial fibrillation (HCC) 06/13/2020  . Pneumonia due to COVID-19 virus 06/12/2020    Past Surgical History:  Procedure Laterality Date  . TUBAL LIGATION    . UTERINE FIBROID SURGERY      Prior to Admission medications   Medication Sig Start Date End Date Taking? Authorizing Provider  carvedilol (COREG) 12.5 MG tablet Take 1 tablet (12.5 mg total) by mouth 2 (two) times daily with a meal. 09/22/16   Darci Current, MD  diphenhydrAMINE (BENADRYL) 25 mg capsule Take 1 capsule (25 mg total) by mouth every 4 (four) hours as needed. 08/01/16 06/12/20  Emily Filbert, MD  glipiZIDE (GLUCOTROL XL) 10 MG 24 hr tablet Take 1 tablet (10 mg total) by mouth daily with breakfast. 06/27/20 07/27/20  Hughie Closs, MD  metFORMIN (GLUCOPHAGE-XR) 500 MG 24 hr tablet Take 1,000 mg by mouth  daily with breakfast.    [provider]  ondansetron (ZOFRAN ODT) 4 MG disintegrating tablet Take 1 tablet (4 mg total) by mouth every 8 (eight) hours as needed. 09/21/20   Jene Every, MD  predniSONE (DELTASONE) 10 MG tablet Take 4 tablets (40 mg) p.o. daily for 2 days starting 06/28/2020, followed by 3 tablets (30 mg) p.o. daily for 3 days starting 06/30/2020, followed by 2 tablets (20 mg) p.o. daily for 3 days starting 07/03/2020 followed by 1 tablet p.o. daily for 3 days starting 07/03/2020 06/27/20   Hughie Closs, MD     Allergies Lisinopril  No family history on file.  Social History Social History   Tobacco Use  . Smoking status: Never Smoker  . Smokeless tobacco: Never Used  Substance Use Topics  . Alcohol use: No  . Drug use: No    Review of Systems  Constitutional: No fever/chills Eyes: No visual changes.  ENT: No sore throat. Cardiovascular: Denies chest pain. Respiratory: Denies shortness of breath. Gastrointestinal: As above Genitourinary: Negative for dysuria. Musculoskeletal: Negative for back pain. Skin: Negative for rash. Neurological: Negative for headaches or weakness   ____________________________________________   PHYSICAL EXAM:  VITAL SIGNS: ED Triage Vitals  Enc Vitals Group     BP 09/21/20 1802 126/82     Pulse Rate 09/21/20 1802 98     Resp 09/21/20 1802 16     Temp 09/21/20 1831 98 F (36.7 C)     Temp Source  09/21/20 1831 Oral     SpO2 09/21/20 1802 98 %     Weight 09/21/20 1803 105.7 kg (233 lb)     Height 09/21/20 1803 1.651 m (5\' 5" )     Head Circumference --      Peak Flow --      Pain Score 09/21/20 1803 0     Pain Loc --      Pain Edu? --      Excl. in GC? --     Constitutional: Alert and oriented. No acute distress. Pleasant and interactive  Nose: No congestion/rhinnorhea. Mouth/Throat: Mucous membranes are moist.   Neck:  Painless ROM Cardiovascular: Normal rate, regular rhythm.  Good peripheral  circulation. Respiratory: Normal respiratory effort.  No retractions. Lungs CTAB. Gastrointestinal: Soft and nontender. No distention.  No CVA tenderness.  Reassuring exam  Musculoskeletal: No lower extremity tenderness nor edema.  Warm and well perfused Neurologic:  Normal speech and language. No gross focal neurologic deficits are appreciated.  Skin:  Skin is warm, dry and intact. No rash noted. Psychiatric: Mood and affect are normal. Speech and behavior are normal.  ____________________________________________   LABS (all labs ordered are listed, but only abnormal results are displayed)  Labs Reviewed  BASIC METABOLIC PANEL - Abnormal; Notable for the following components:      Result Value   Potassium 3.3 (*)    Glucose, Bld 68 (*)    All other components within normal limits  CBC - Abnormal; Notable for the following components:   WBC 10.7 (*)    RDW 17.1 (*)    All other components within normal limits  CBG MONITORING, ED - Abnormal; Notable for the following components:   Glucose-Capillary 63 (*)    All other components within normal limits  URINALYSIS, COMPLETE (UACMP) WITH MICROSCOPIC  CBG MONITORING, ED   ____________________________________________  EKG  ED ECG REPORT I, 09/23/20, the attending physician, personally viewed and interpreted this ECG.  Date: 09/21/2020  Rhythm: normal sinus rhythm QRS Axis: normal Intervals: normal ST/T Wave abnormalities: Nonspecific change Narrative Interpretation: no evidence of acute ischemia  ____________________________________________  RADIOLOGY  None ____________________________________________   PROCEDURES  Procedure(s) performed: No  Procedures   Critical Care performed: No ____________________________________________   INITIAL IMPRESSION / ASSESSMENT AND PLAN / ED COURSE  Pertinent labs & imaging results that were available during my care of the patient were reviewed by me and considered in my  medical decision making (see chart for details).  Patient presents with complaints of nausea vomiting diarrhea, she report symptoms of almost entirely resolved at this point.  She has no abdominal tenderness to palpation and vital signs are reassuring, exam is unremarkable.  CBC demonstrates mild elevated white blood cell count which is nonspecific, differential includes viral GI syndrome, foodborne illness.  We will give ODT Zofran, p.o. challenge, pending chemistries.  Anticipate discharge  Chemistries reassuring, mildly low glucose noted however patient without any lightheadedness or dizziness.  She feels well.  She is having a ginger ale currently, she will continue to monitor her sugar    ____________________________________________   FINAL CLINICAL IMPRESSION(S) / ED DIAGNOSES  Final diagnoses:  Nausea vomiting and diarrhea        Note:  This document was prepared using Dragon voice recognition software and may include unintentional dictation errors.   09/23/2020, MD 09/21/20 2031

## 2020-09-21 NOTE — ED Notes (Signed)
Called [facility] to transport patient to [address] [place], [place], per Perlie Gold

## 2020-09-21 NOTE — ED Notes (Signed)
MD aware of pt blood sugar 63.  Pt given juice & Ginger Ale.   Family at bedside

## 2020-09-21 NOTE — ED Notes (Signed)
IV team at bedside 

## 2020-09-21 NOTE — ED Notes (Signed)
Transport requested via [facility]

## 2020-09-21 NOTE — ED Notes (Signed)
Discharge instructions reviewed with Pt and daughter

## 2020-09-21 NOTE — ED Triage Notes (Signed)
Pt here via [facility] from home with N/V and FTT. Pt has been having N/V for 12 hours and has had a FTT since Sept when she had a covid diagnosis. Pt has a hx of UTI and diverticulitis. Pt NAD on arrival.

## 2020-12-03 ENCOUNTER — Emergency Department: Payer: Medicare Other

## 2020-12-03 ENCOUNTER — Inpatient Hospital Stay: Payer: Medicare Other

## 2020-12-03 ENCOUNTER — Other Ambulatory Visit: Payer: Self-pay

## 2020-12-03 ENCOUNTER — Inpatient Hospital Stay
Admission: EM | Admit: 2020-12-03 | Discharge: 2020-12-16 | DRG: 682 | Disposition: A | Discharge status: Home Health | Payer: Medicare Other | Attending: Internal Medicine | Admitting: Internal Medicine

## 2020-12-03 DIAGNOSIS — K828 Other specified diseases of gallbladder: Secondary | ICD-10-CM | POA: Diagnosis present

## 2020-12-03 DIAGNOSIS — Z7952 Long term (current) use of systemic steroids: Secondary | ICD-10-CM

## 2020-12-03 DIAGNOSIS — L89322 Pressure ulcer of left buttock, stage 2: Secondary | ICD-10-CM | POA: Diagnosis present

## 2020-12-03 DIAGNOSIS — Z20822 Contact with and (suspected) exposure to covid-19: Secondary | ICD-10-CM | POA: Diagnosis present

## 2020-12-03 DIAGNOSIS — I471 Supraventricular tachycardia: Secondary | ICD-10-CM | POA: Diagnosis present

## 2020-12-03 DIAGNOSIS — R680 Hypothermia, not associated with low environmental temperature: Secondary | ICD-10-CM | POA: Diagnosis not present

## 2020-12-03 DIAGNOSIS — Z452 Encounter for adjustment and management of vascular access device: Secondary | ICD-10-CM

## 2020-12-03 DIAGNOSIS — J690 Pneumonitis due to inhalation of food and vomit: Secondary | ICD-10-CM | POA: Diagnosis not present

## 2020-12-03 DIAGNOSIS — F32A Depression, unspecified: Secondary | ICD-10-CM

## 2020-12-03 DIAGNOSIS — Z833 Family history of diabetes mellitus: Secondary | ICD-10-CM

## 2020-12-03 DIAGNOSIS — I10 Essential (primary) hypertension: Secondary | ICD-10-CM | POA: Diagnosis present

## 2020-12-03 DIAGNOSIS — E876 Hypokalemia: Secondary | ICD-10-CM | POA: Diagnosis present

## 2020-12-03 DIAGNOSIS — E1121 Type 2 diabetes mellitus with diabetic nephropathy: Secondary | ICD-10-CM | POA: Diagnosis present

## 2020-12-03 DIAGNOSIS — R634 Abnormal weight loss: Secondary | ICD-10-CM

## 2020-12-03 DIAGNOSIS — R748 Abnormal levels of other serum enzymes: Secondary | ICD-10-CM | POA: Diagnosis present

## 2020-12-03 DIAGNOSIS — I700 Atherosclerosis of aorta: Secondary | ICD-10-CM | POA: Diagnosis present

## 2020-12-03 DIAGNOSIS — E785 Hyperlipidemia, unspecified: Secondary | ICD-10-CM | POA: Diagnosis present

## 2020-12-03 DIAGNOSIS — R0602 Shortness of breath: Secondary | ICD-10-CM

## 2020-12-03 DIAGNOSIS — R627 Adult failure to thrive: Secondary | ICD-10-CM

## 2020-12-03 DIAGNOSIS — E872 Acidosis: Secondary | ICD-10-CM | POA: Diagnosis not present

## 2020-12-03 DIAGNOSIS — D649 Anemia, unspecified: Secondary | ICD-10-CM | POA: Diagnosis not present

## 2020-12-03 DIAGNOSIS — Z6841 Body Mass Index (BMI) 40.0 and over, adult: Secondary | ICD-10-CM

## 2020-12-03 DIAGNOSIS — N179 Acute kidney failure, unspecified: Secondary | ICD-10-CM | POA: Diagnosis present

## 2020-12-03 DIAGNOSIS — Z8616 Personal history of COVID-19: Secondary | ICD-10-CM | POA: Diagnosis not present

## 2020-12-03 DIAGNOSIS — E43 Unspecified severe protein-calorie malnutrition: Secondary | ICD-10-CM | POA: Diagnosis present

## 2020-12-03 DIAGNOSIS — R319 Hematuria, unspecified: Secondary | ICD-10-CM | POA: Diagnosis present

## 2020-12-03 DIAGNOSIS — R000 Tachycardia, unspecified: Secondary | ICD-10-CM

## 2020-12-03 DIAGNOSIS — Z515 Encounter for palliative care: Secondary | ICD-10-CM | POA: Diagnosis not present

## 2020-12-03 DIAGNOSIS — R7989 Other specified abnormal findings of blood chemistry: Secondary | ICD-10-CM | POA: Diagnosis not present

## 2020-12-03 DIAGNOSIS — G0401 Postinfectious acute disseminated encephalitis and encephalomyelitis (postinfectious ADEM): Secondary | ICD-10-CM | POA: Diagnosis not present

## 2020-12-03 DIAGNOSIS — L89312 Pressure ulcer of right buttock, stage 2: Secondary | ICD-10-CM | POA: Diagnosis present

## 2020-12-03 DIAGNOSIS — R531 Weakness: Secondary | ICD-10-CM

## 2020-12-03 DIAGNOSIS — R4182 Altered mental status, unspecified: Secondary | ICD-10-CM | POA: Diagnosis present

## 2020-12-03 DIAGNOSIS — L89300 Pressure ulcer of unspecified buttock, unstageable: Secondary | ICD-10-CM | POA: Diagnosis not present

## 2020-12-03 DIAGNOSIS — M1990 Unspecified osteoarthritis, unspecified site: Secondary | ICD-10-CM | POA: Diagnosis present

## 2020-12-03 DIAGNOSIS — Z993 Dependence on wheelchair: Secondary | ICD-10-CM

## 2020-12-03 DIAGNOSIS — I4891 Unspecified atrial fibrillation: Secondary | ICD-10-CM

## 2020-12-03 DIAGNOSIS — Z931 Gastrostomy status: Secondary | ICD-10-CM

## 2020-12-03 DIAGNOSIS — R29898 Other symptoms and signs involving the musculoskeletal system: Secondary | ICD-10-CM | POA: Diagnosis not present

## 2020-12-03 DIAGNOSIS — G9341 Metabolic encephalopathy: Secondary | ICD-10-CM | POA: Diagnosis present

## 2020-12-03 DIAGNOSIS — Z79899 Other long term (current) drug therapy: Secondary | ICD-10-CM

## 2020-12-03 DIAGNOSIS — U099 Post covid-19 condition, unspecified: Secondary | ICD-10-CM | POA: Diagnosis not present

## 2020-12-03 DIAGNOSIS — L8990 Pressure ulcer of unspecified site, unspecified stage: Secondary | ICD-10-CM | POA: Insufficient documentation

## 2020-12-03 DIAGNOSIS — Z7401 Bed confinement status: Secondary | ICD-10-CM

## 2020-12-03 DIAGNOSIS — Z888 Allergy status to other drugs, medicaments and biological substances status: Secondary | ICD-10-CM

## 2020-12-03 DIAGNOSIS — K439 Ventral hernia without obstruction or gangrene: Secondary | ICD-10-CM | POA: Diagnosis present

## 2020-12-03 DIAGNOSIS — Z8249 Family history of ischemic heart disease and other diseases of the circulatory system: Secondary | ICD-10-CM

## 2020-12-03 DIAGNOSIS — Z7984 Long term (current) use of oral hypoglycemic drugs: Secondary | ICD-10-CM

## 2020-12-03 DIAGNOSIS — Z7189 Other specified counseling: Secondary | ICD-10-CM | POA: Diagnosis not present

## 2020-12-03 DIAGNOSIS — R400 Somnolence: Secondary | ICD-10-CM | POA: Diagnosis not present

## 2020-12-03 DIAGNOSIS — I959 Hypotension, unspecified: Secondary | ICD-10-CM | POA: Diagnosis present

## 2020-12-03 DIAGNOSIS — R401 Stupor: Secondary | ICD-10-CM | POA: Diagnosis not present

## 2020-12-03 LAB — URINALYSIS, COMPLETE (UACMP) WITH MICROSCOPIC
Bacteria, UA: NONE SEEN
Bilirubin Urine: NEGATIVE
Glucose, UA: 50 mg/dL — AB
Hgb urine dipstick: NEGATIVE
Ketones, ur: 20 mg/dL — AB
Leukocytes,Ua: NEGATIVE
Nitrite: NEGATIVE
Protein, ur: 100 mg/dL — AB
Specific Gravity, Urine: 1.021 (ref 1.005–1.030)
pH: 5 (ref 5.0–8.0)

## 2020-12-03 LAB — CBC
HCT: 38.4 % (ref 36.0–46.0)
Hemoglobin: 13.1 g/dL (ref 12.0–15.0)
MCH: 28.3 pg (ref 26.0–34.0)
MCHC: 34.1 g/dL (ref 30.0–36.0)
MCV: 82.9 fL (ref 80.0–100.0)
Platelets: 268 10*3/uL (ref 150–400)
RBC: 4.63 MIL/uL (ref 3.87–5.11)
RDW: 18.3 % — ABNORMAL HIGH (ref 11.5–15.5)
WBC: 12.7 10*3/uL — ABNORMAL HIGH (ref 4.0–10.5)
nRBC: 0 % (ref 0.0–0.2)

## 2020-12-03 LAB — COMPREHENSIVE METABOLIC PANEL
ALT: 37 U/L (ref 0–44)
AST: 37 U/L (ref 15–41)
Albumin: 2.6 g/dL — ABNORMAL LOW (ref 3.5–5.0)
Alkaline Phosphatase: 366 U/L — ABNORMAL HIGH (ref 38–126)
Anion gap: 16 — ABNORMAL HIGH (ref 5–15)
BUN: 29 mg/dL — ABNORMAL HIGH (ref 8–23)
CO2: 18 mmol/L — ABNORMAL LOW (ref 22–32)
Calcium: 9.7 mg/dL (ref 8.9–10.3)
Chloride: 102 mmol/L (ref 98–111)
Creatinine, Ser: 1.46 mg/dL — ABNORMAL HIGH (ref 0.44–1.00)
GFR, Estimated: 39 mL/min — ABNORMAL LOW (ref >60)
Glucose, Bld: 124 mg/dL — ABNORMAL HIGH (ref 70–99)
Potassium: 3.1 mmol/L — ABNORMAL LOW (ref 3.5–5.1)
Sodium: 136 mmol/L (ref 135–145)
Total Bilirubin: 2.2 mg/dL — ABNORMAL HIGH (ref 0.3–1.2)
Total Protein: 6.4 g/dL — ABNORMAL LOW (ref 6.5–8.1)

## 2020-12-03 LAB — LIPASE, BLOOD: Lipase: 48 U/L (ref 11–51)

## 2020-12-03 LAB — MAGNESIUM: Magnesium: 1.7 mg/dL (ref 1.7–2.4)

## 2020-12-03 LAB — RESP PANEL BY RT-PCR (FLU A&B, COVID) ARPGX2
Influenza A by PCR: NEGATIVE
Influenza B by PCR: NEGATIVE
SARS Coronavirus 2 by RT PCR: NEGATIVE

## 2020-12-03 LAB — TROPONIN I (HIGH SENSITIVITY): Troponin I (High Sensitivity): 12 ng/L (ref <18)

## 2020-12-03 IMAGING — CT CT ABD-PELV W/O CM
2 of 4 series · 16 of 46 positions shown, 18 images · non-contrast
Comparison: None.

CLINICAL DATA: Nausea vomiting.

EXAM:
CT ABDOMEN AND PELVIS WITHOUT CONTRAST
TECHNIQUE: Multidetector CT imaging of the abdomen and pelvis was performed
following the standard protocol without IV contrast.

[Series 2: routine abd/pel wo · axial · 0.90mm/px · z∈[-403,+27]mm · 13 of 94 slices shown, 15 images]
[im 4/94  soft-tissue]
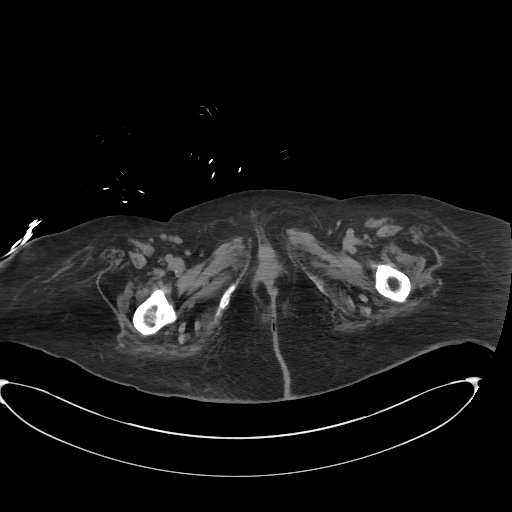
[im 4/94  bone]
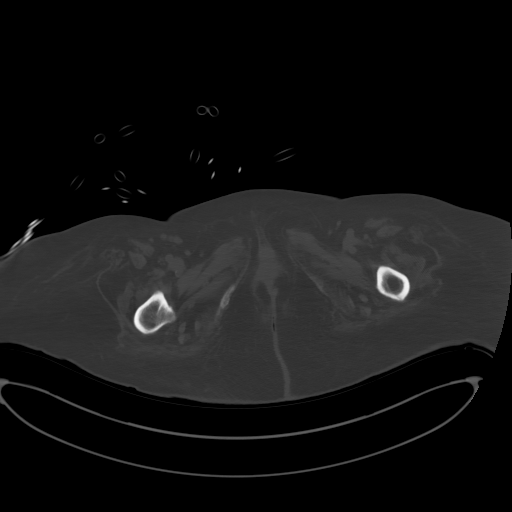
[im 11/94  soft-tissue]
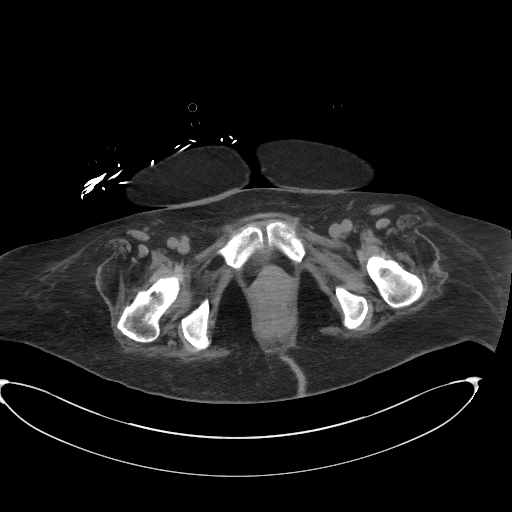
[im 18/94  soft-tissue]
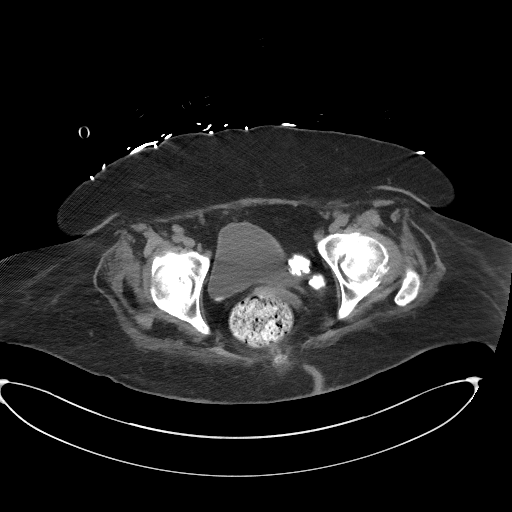
[im 26/94  soft-tissue]
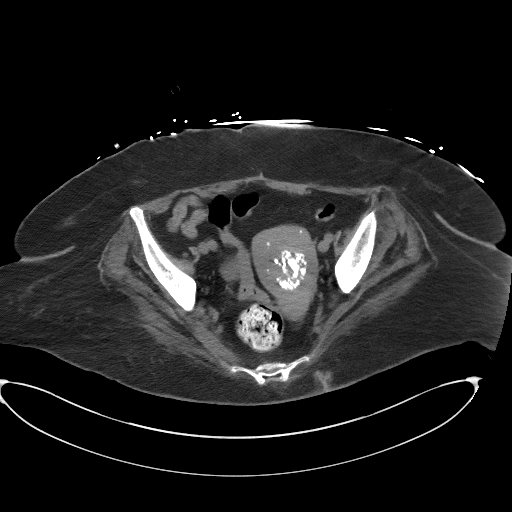
[im 33/94  soft-tissue]
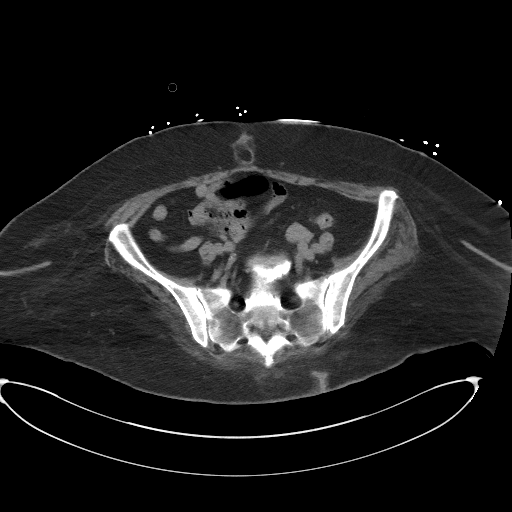
[im 40/94  soft-tissue]
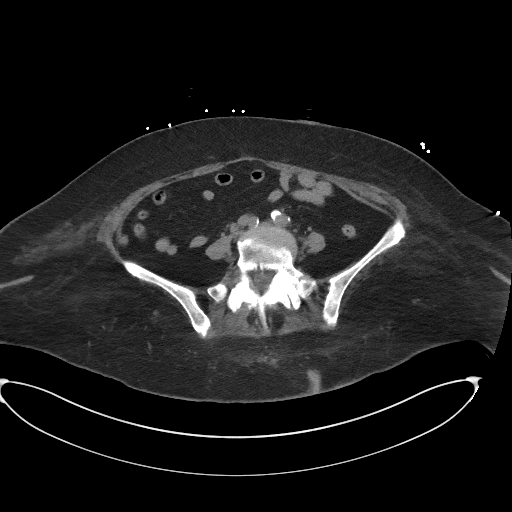
[im 47/94  soft-tissue]
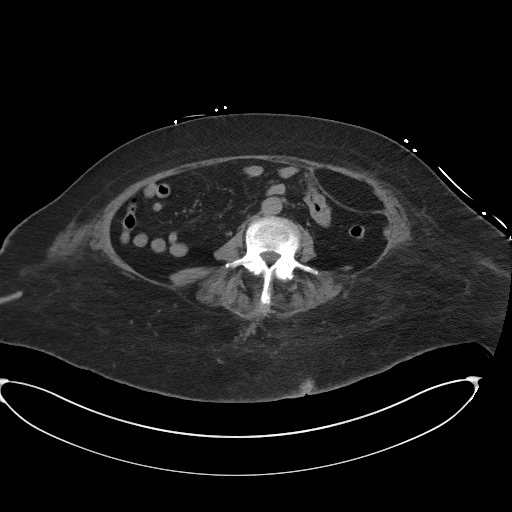
[im 54/94  soft-tissue]
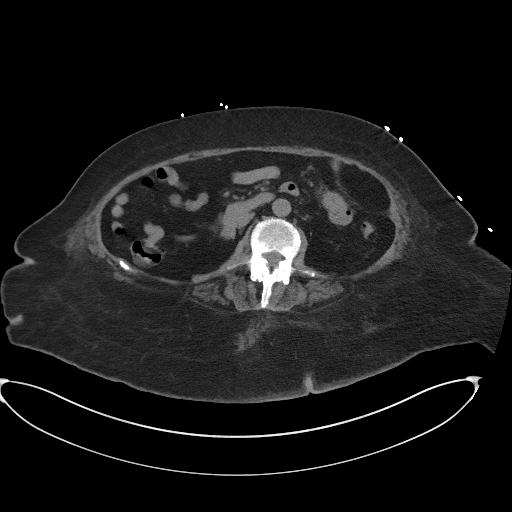
[im 61/94  soft-tissue]
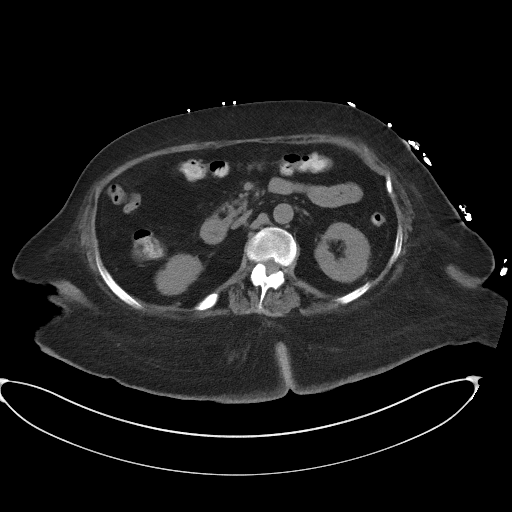
[im 61/94  bone]
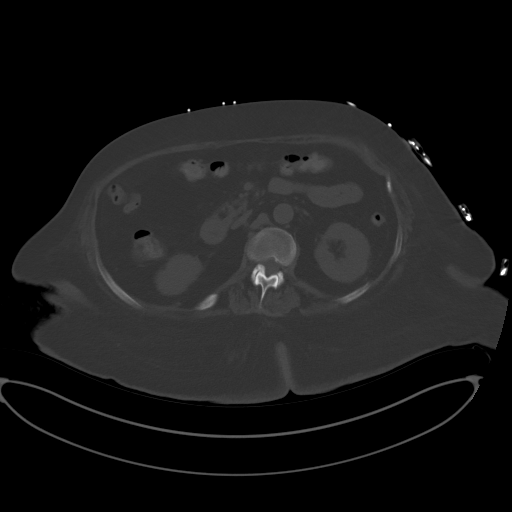
[im 68/94  soft-tissue]
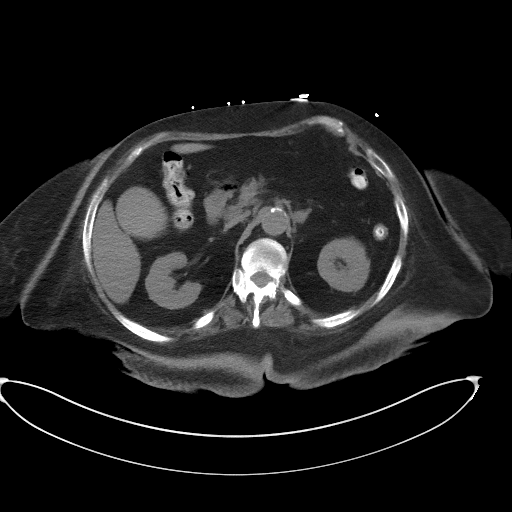
[im 76/94  soft-tissue]
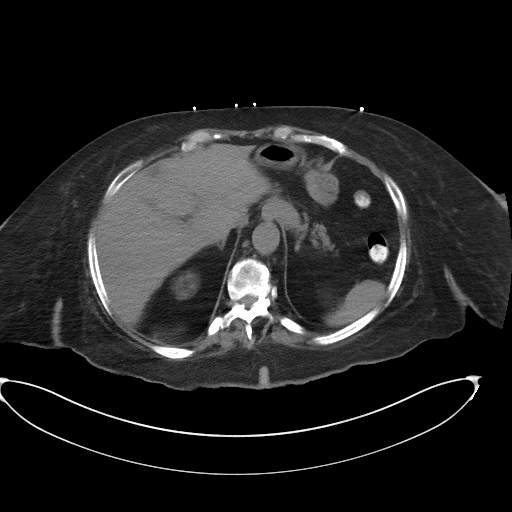
[im 83/94  soft-tissue]
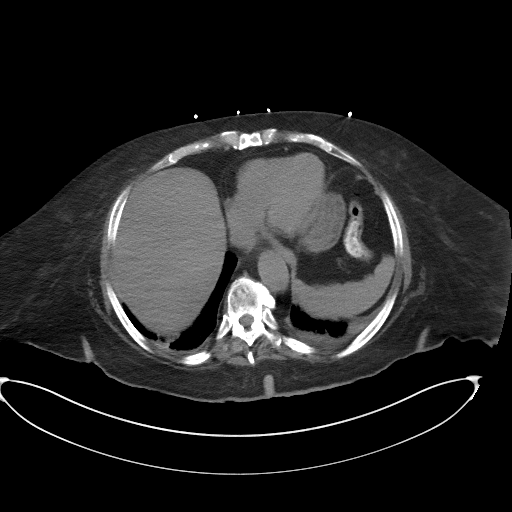
[im 90/94  soft-tissue]
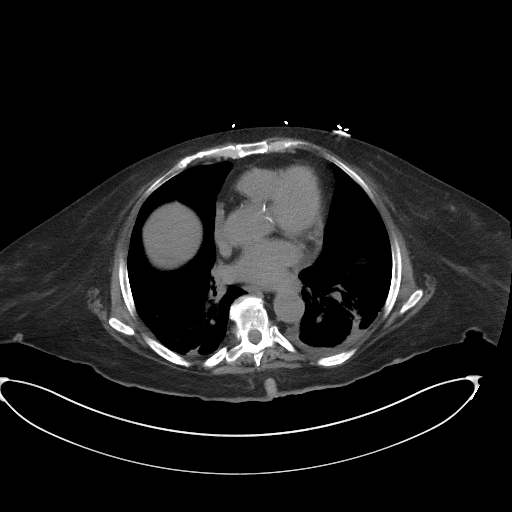

[Series 5: coronal st · coronal · 0.77mm/px · 3 of 93 slices shown]
[im 31/93  soft-tissue]
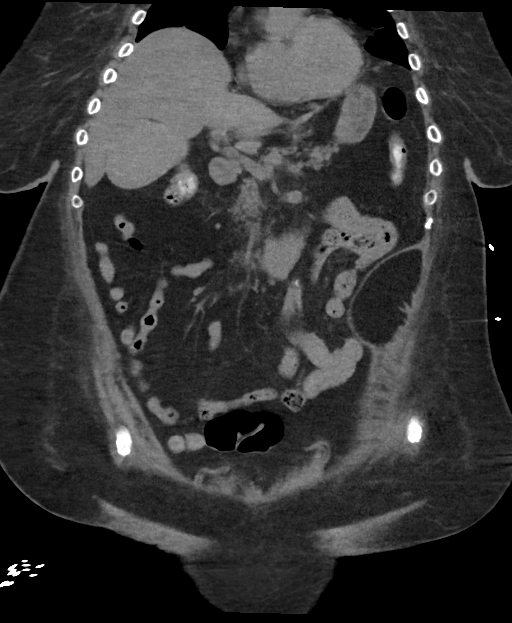
[im 41/93  soft-tissue]
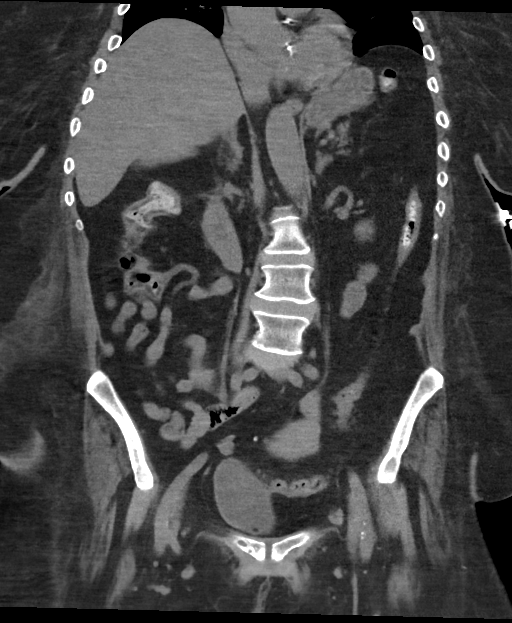
[im 52/93  soft-tissue]
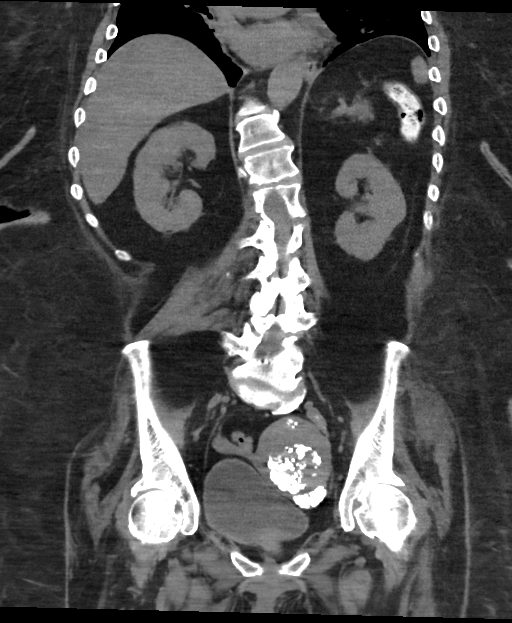

[16 of 46 positions shown; findings below may reference images not displayed]

FINDINGS: Lower chest: Streaky and patchy airspace opacities with peripheral
predominance in the bilateral lung bases. Small left pleural
effusion.

Hepatobiliary: Normal appearance of the liver. High density of the
contents of the gallbladder.

Pancreas: Unremarkable. No pancreatic ductal dilatation or
surrounding inflammatory changes.

Spleen: Normal in size without focal abnormality.

Adrenals/Urinary Tract: Normal adrenal glands. Renal cysts. No
evidence of obstructive uropathy or nephrolithiasis. Calcific
density within the dependent portion of the right hand side of the
urinary bladder may represent a collection of minute
calculi/crystals.

Stomach/Bowel: Stomach is within normal limits. No evidence of
appendicitis. No evidence of bowel wall thickening, distention, or
inflammatory changes.

Vascular/Lymphatic: Aortic atherosclerosis. No enlarged abdominal or
pelvic lymph nodes.

Reproductive: Leiomyomatous uterus.  No adnexal masses seen.

Other: Fat containing periumbilical anterior abdominal wall hernia.

Musculoskeletal: S shaped scoliosis and spondylosis of the
lumbosacral spine.
IMPRESSION: 1. No evidence of obstructive uropathy or nephrolithiasis.
2. Calcific density within the dependent portion of the right hand
side of the urinary bladder may represent a collection of minute
calculi/crystals.
3. Streaky and patchy airspace opacities with peripheral
predominance in the bilateral lung bases. This may represent
multifocal pneumonia or aspiration pneumonitis. Small left pleural
effusion.
4. High density of the contents of the gallbladder, likely
representing sludge. Please correlate to right upper quadrant
ultrasound.
5. Leiomyomatous uterus.
6. Fat containing periumbilical anterior abdominal wall hernia.
7. Aortic atherosclerosis.

Aortic Atherosclerosis ([7J]-[7J]).

## 2020-12-03 IMAGING — US US ABDOMEN LIMITED RUQ/ASCITES
1 series · 14 of 24 positions shown · non-contrast
Comparison: None.

CLINICAL DATA: Elevated liver function tests.

EXAM:
ULTRASOUND ABDOMEN LIMITED RIGHT UPPER QUADRANT

[Series 1: us abdomen limited ruq (liver/gb) · 14 of 24 slices shown]
[im 1/24]
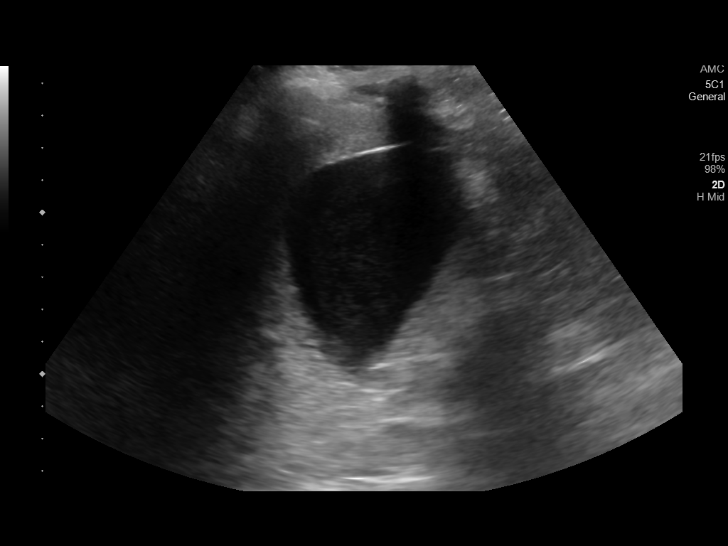
[im 3/24]
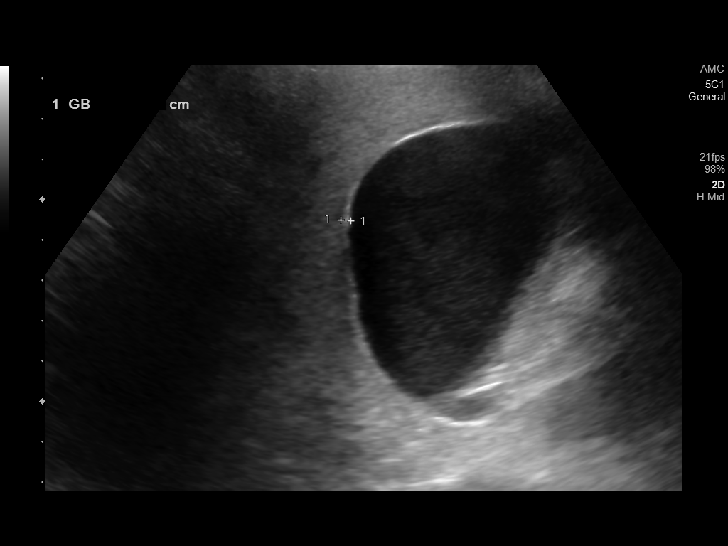
[im 5/24]
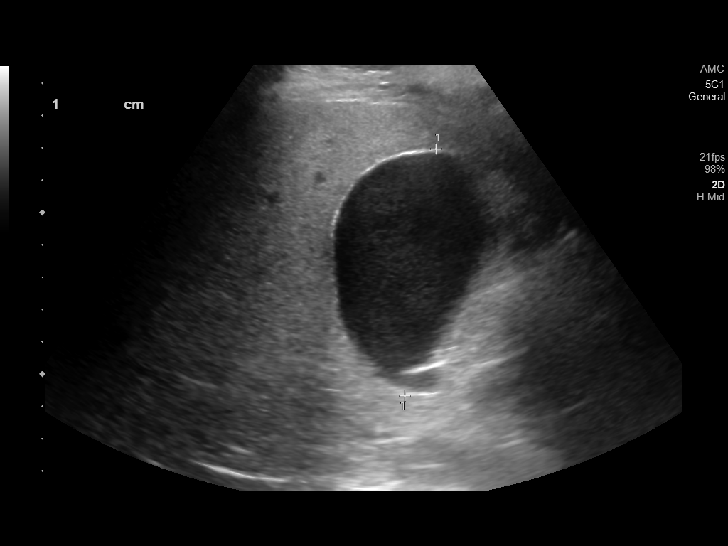
[im 7/24]
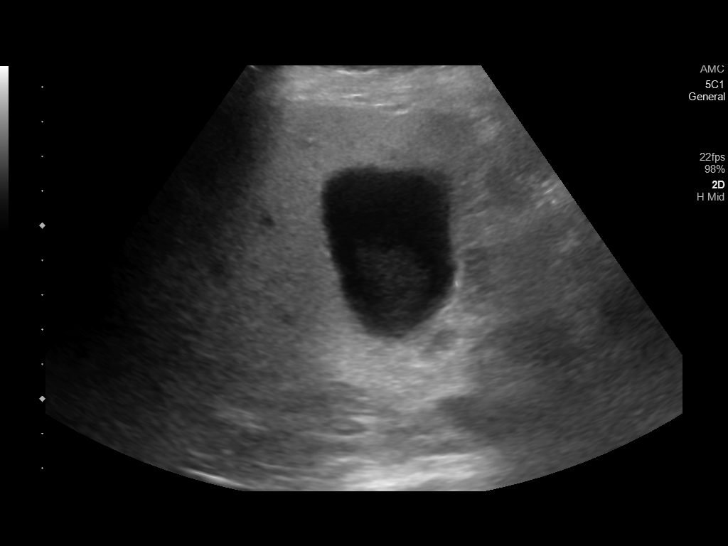
[im 8/24]
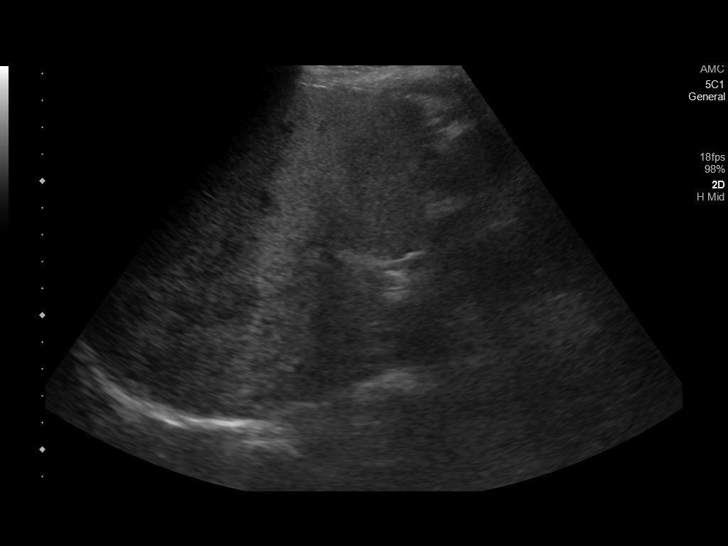
[im 10/24]
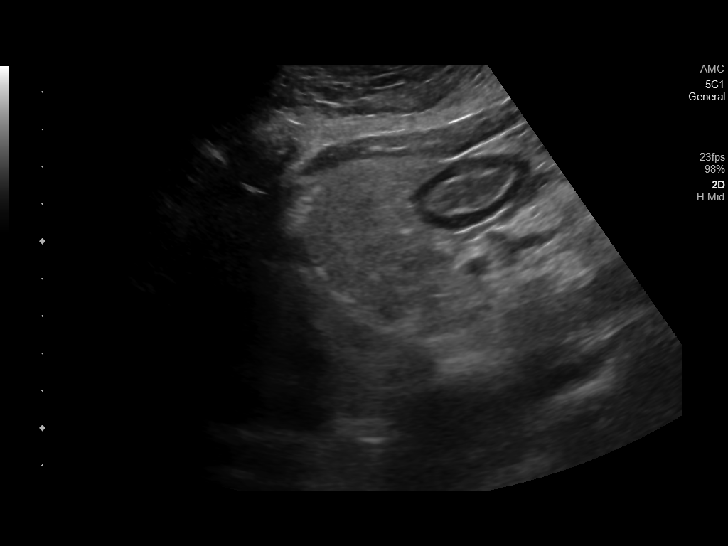
[im 12/24]
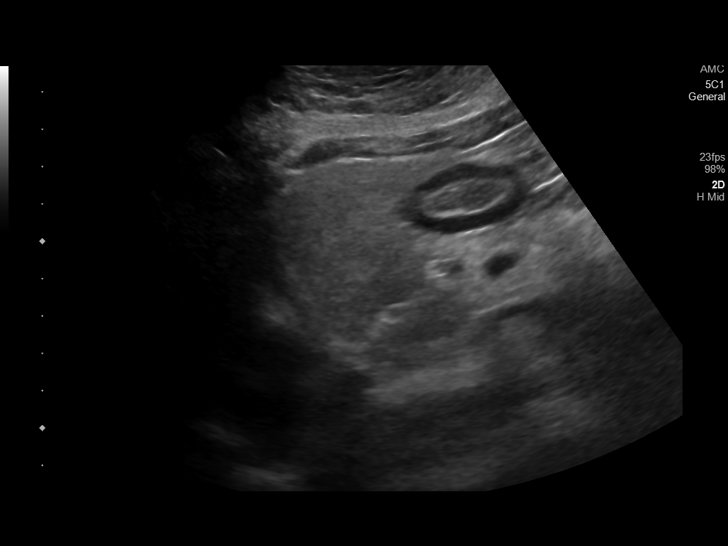
[im 13/24]
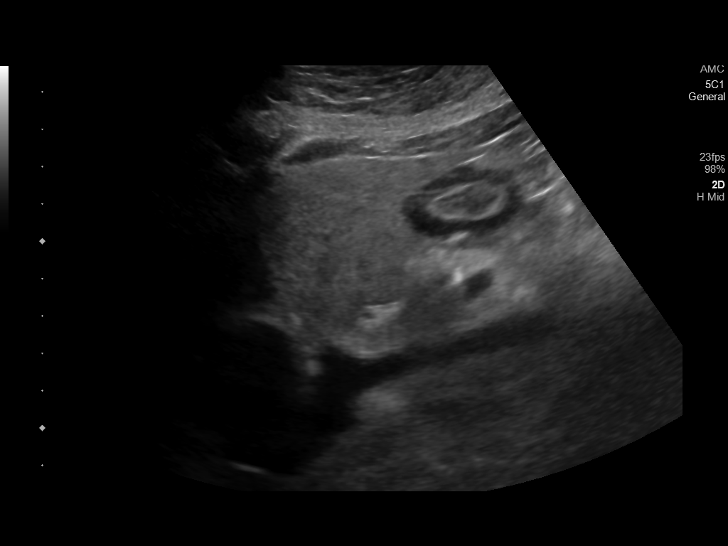
[im 15/24]
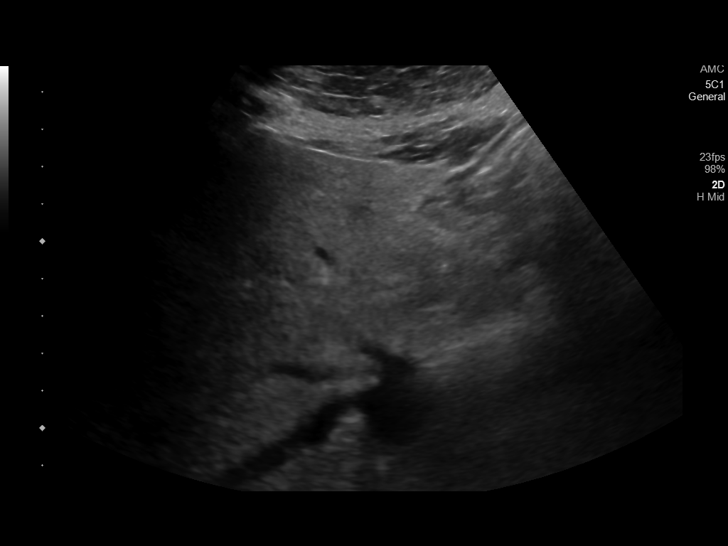
[im 17/24]
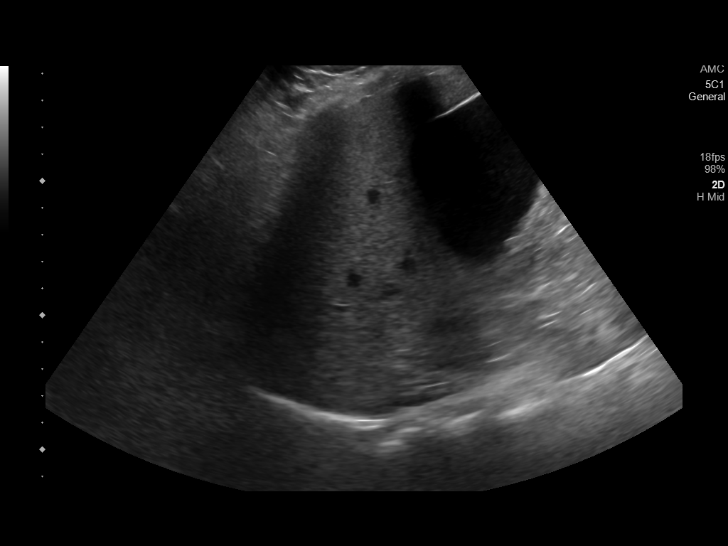
[im 19/24]
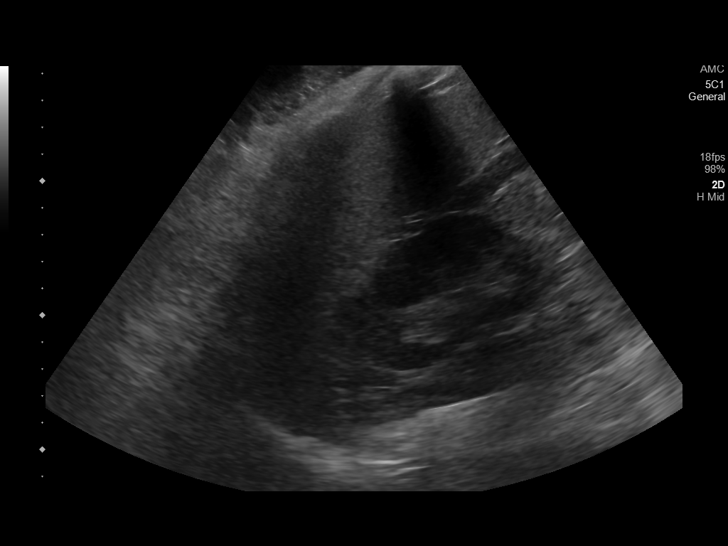
[im 20/24]
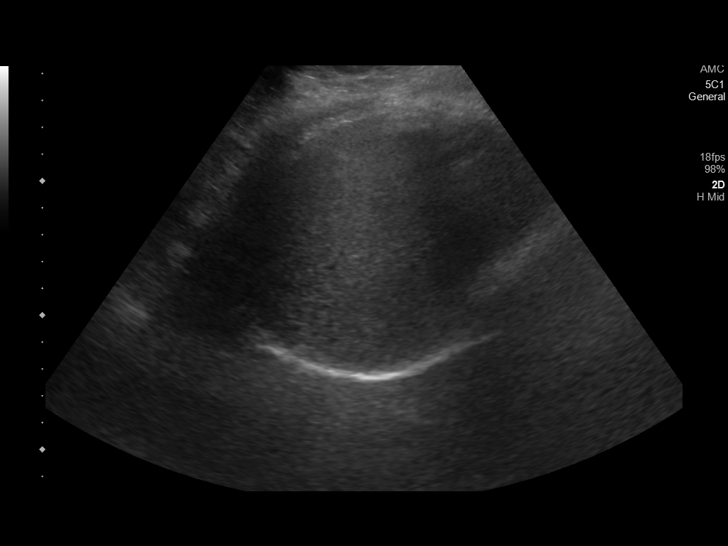
[im 22/24]
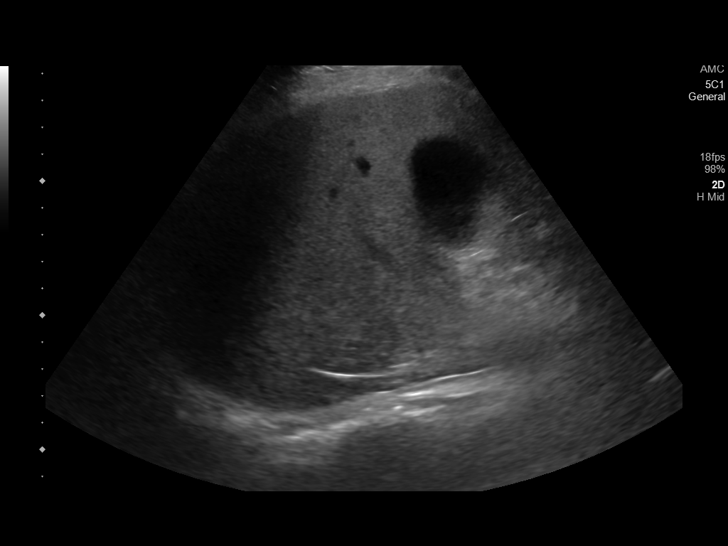
[im 24/24]
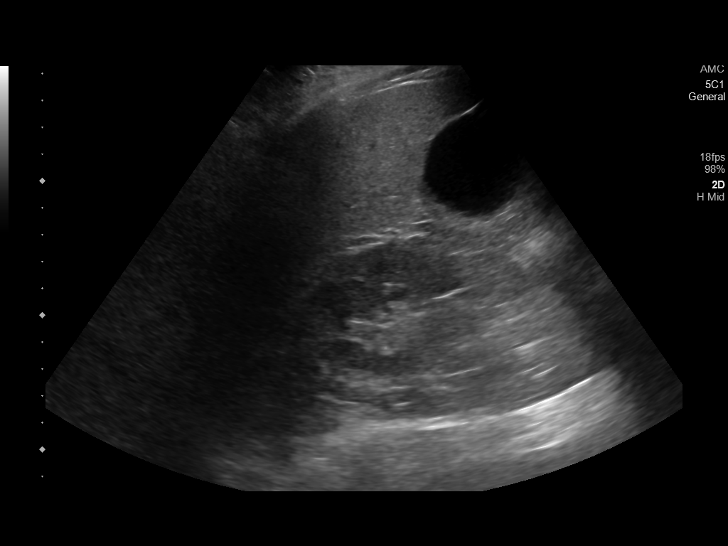

[14 of 24 positions shown; findings below may reference images not displayed]

FINDINGS: Gallbladder:

Distended gallbladder is noted without gallbladder wall thickening
or pericholecystic fluid. No sonographic Murphy's sign is noted. No
definite cholelithiasis is noted.

Common bile duct:

Not visualized.  Exam is limited due to body habitus.

Liver:

No focal lesion identified. Within normal limits in parenchymal
echogenicity. Portal vein is patent on color Doppler imaging with
normal direction of blood flow towards the liver.

Other: None.
IMPRESSION: Gallbladder distention is noted without definite evidence of
cholelithiasis or cholecystitis.

Common bile duct is not visualized due to body habitus.

## 2020-12-03 IMAGING — CT CT HEAD W/O CM
3 of 4 series · 14 of 47 positions shown, 16 images · non-contrast
Comparison: [DATE]

CLINICAL DATA: Neural deficit.

EXAM:
CT HEAD WITHOUT CONTRAST
TECHNIQUE: Contiguous axial images were obtained from the base of the skull
through the vertex without intravenous contrast.

[Series 4: coronal soft tissue · coronal · 0.29mm/px · 3 of 66 slices shown]
[im 22/66  brain]
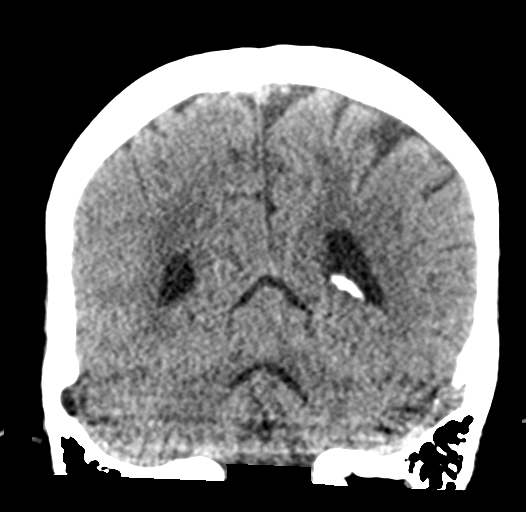
[im 29/66  brain]
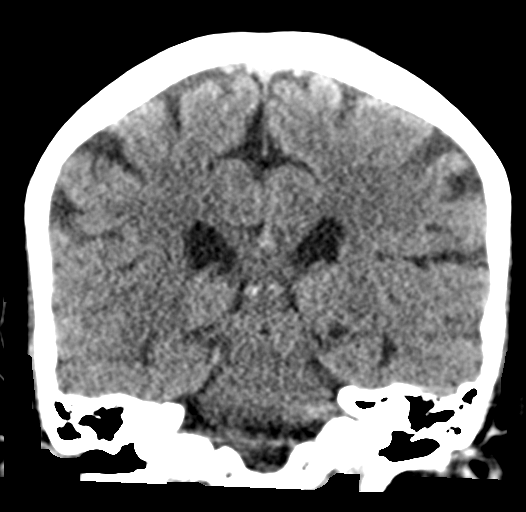
[im 37/66  brain]
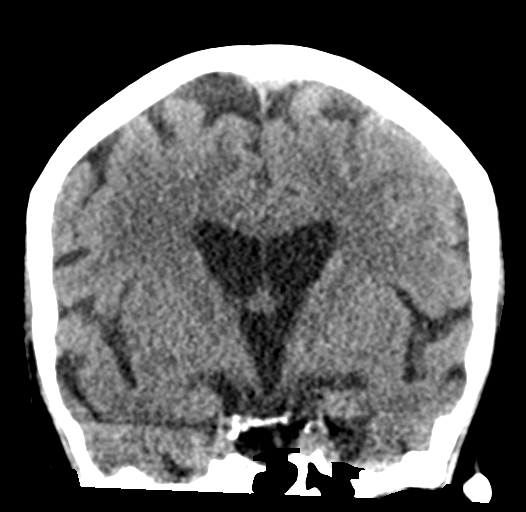

[Series 5: sagittal soft tissue · sagittal · 0.29mm/px · 3 of 52 slices shown]
[im 18/52  brain]
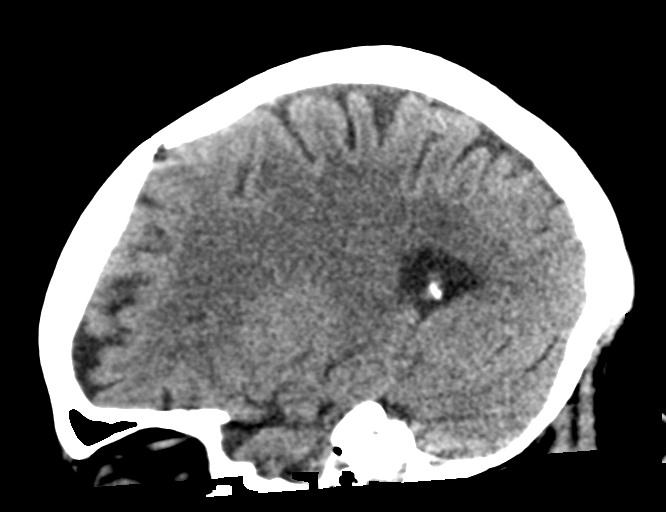
[im 26/52  brain]
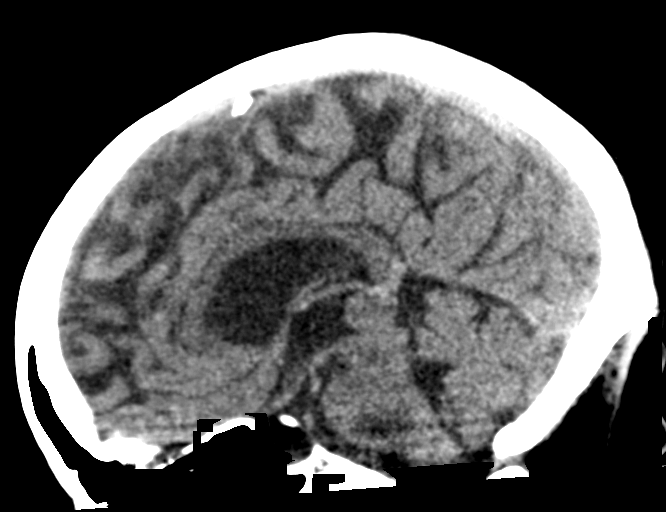
[im 35/52  brain]
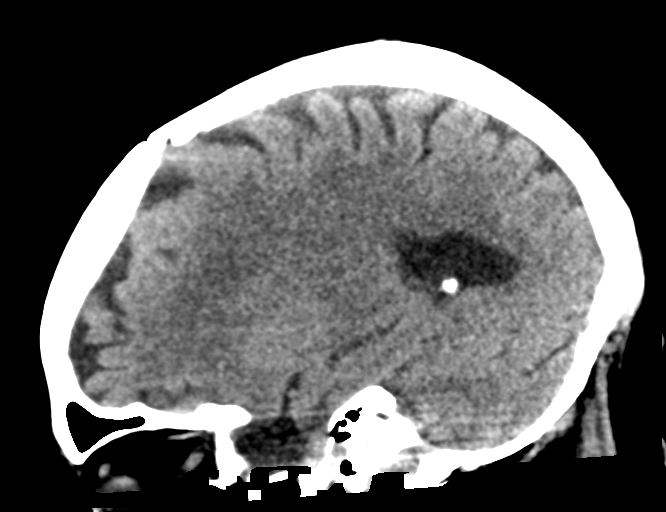

[Series 6: ax head wo recon · axial · 0.30mm/px · z∈[-157,-29]mm · 8 of 31 slices shown, 10 images]
[im 3/31  brain]
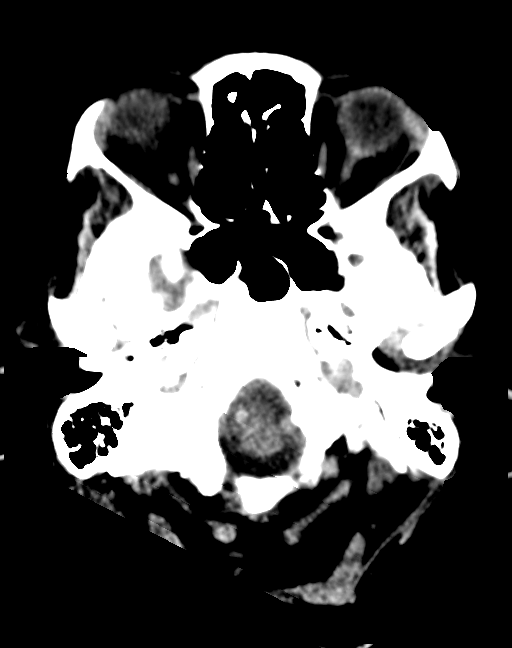
[im 3/31  bone]
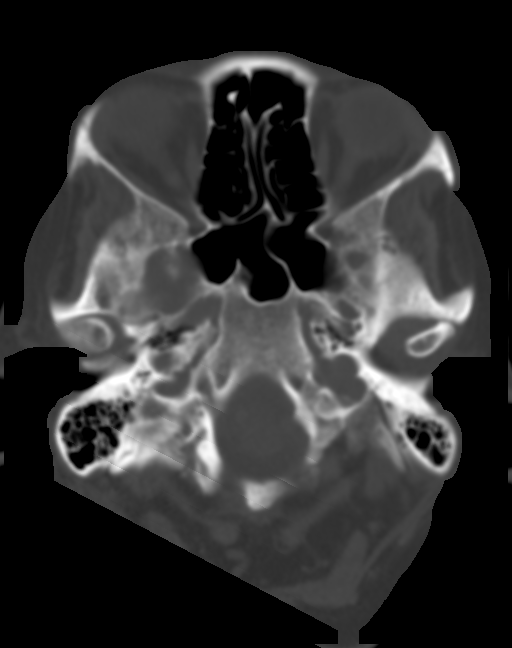
[im 7/31  brain]
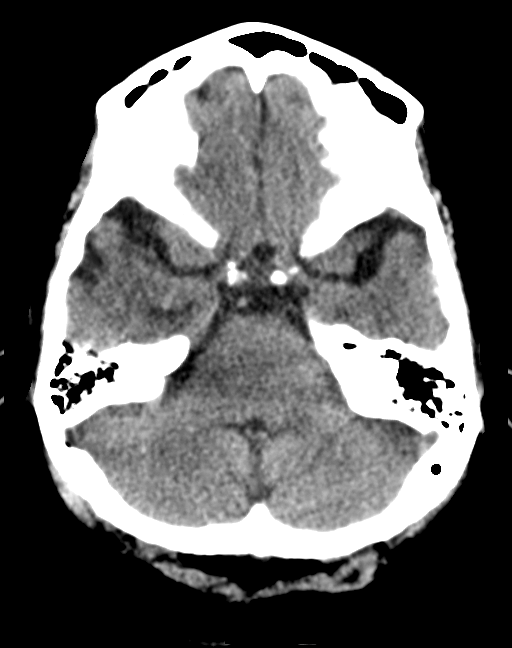
[im 11/31  brain]
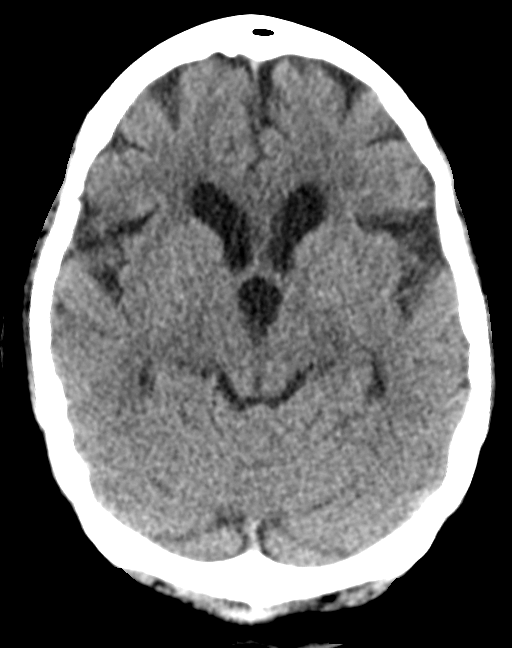
[im 15/31  brain]
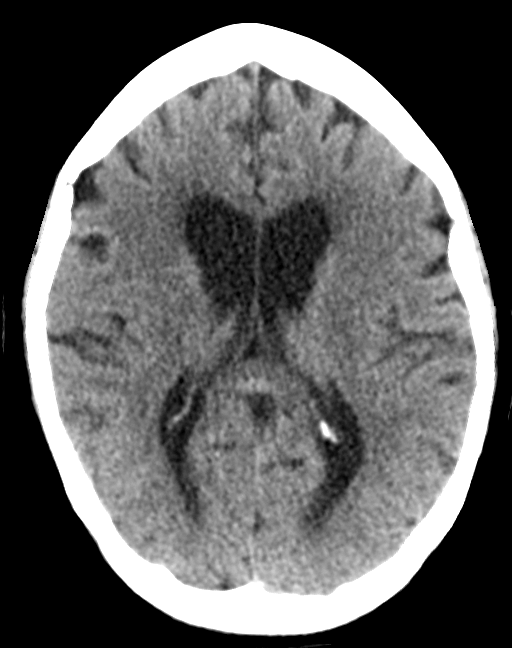
[im 17/31  brain]
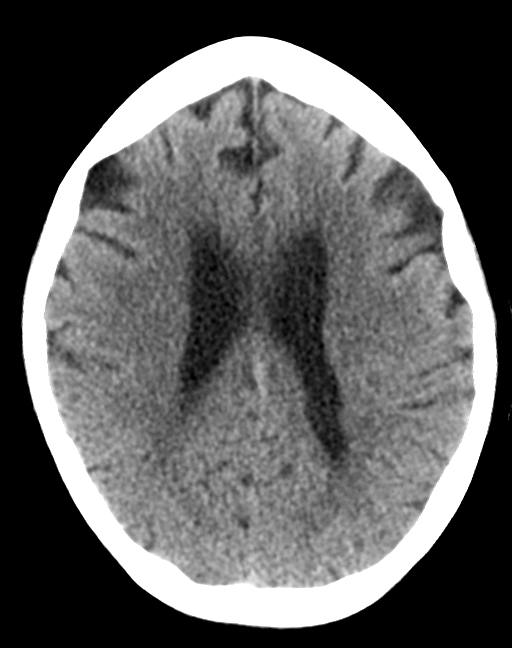
[im 17/31  bone]
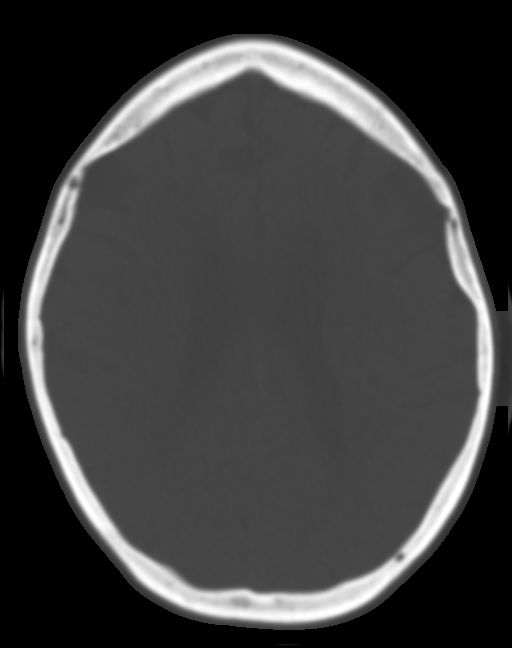
[im 21/31  brain]
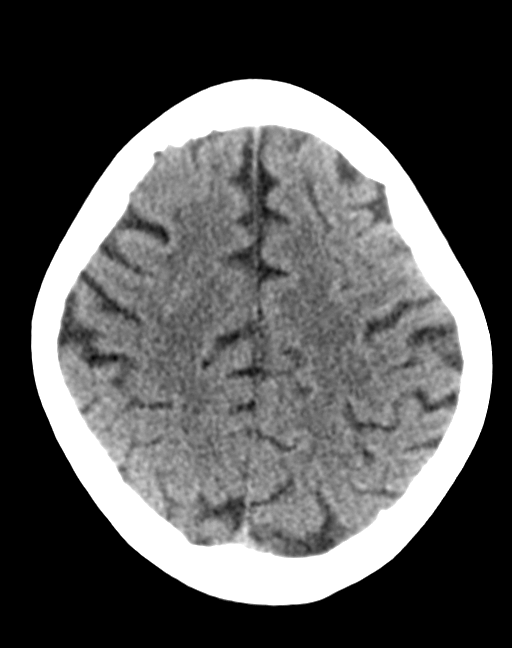
[im 25/31  brain]
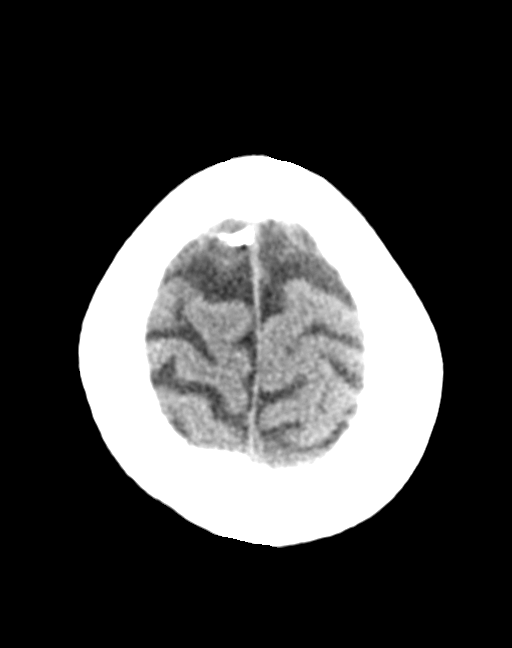
[im 29/31  brain]
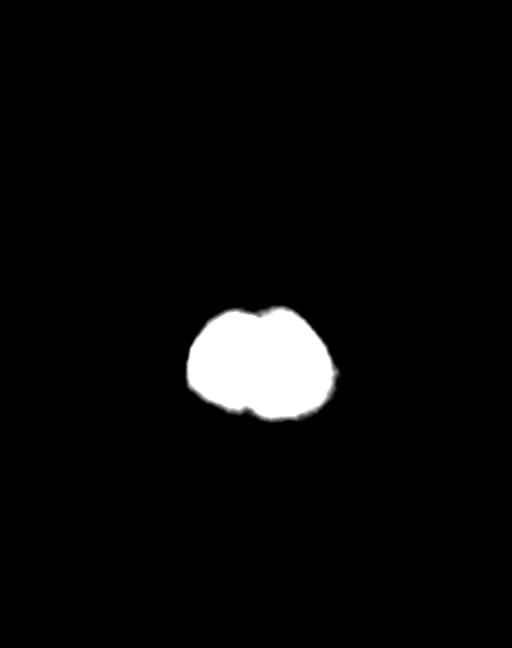

[14 of 47 positions shown; findings below may reference images not displayed]

FINDINGS: Brain: No evidence of acute infarction, hemorrhage, hydrocephalus,
extra-axial collection or mass lesion/mass effect. Mild brain
parenchymal volume loss and deep white matter microangiopathy.

Vascular: Calcific atherosclerotic disease of the intra cavernous
carotid arteries.

Skull: Normal. Negative for fracture or focal lesion.

Sinuses/Orbits: No acute finding.

Other: None.
IMPRESSION: 1. No acute intracranial abnormality.
2. Mild brain parenchymal atrophy and chronic microvascular disease.

## 2020-12-03 MED ORDER — GLIPIZIDE ER 10 MG PO TB24
10.0000 mg | ORAL_TABLET | Freq: Every day | ORAL | Status: DC
  Filled 2020-12-03: qty 1

## 2020-12-03 MED ORDER — ACETAMINOPHEN 650 MG RE SUPP
650.0000 mg | Freq: Four times a day (QID) | RECTAL | Status: DC | PRN
  Administered 2020-12-07: 650 mg via RECTAL
  Filled 2020-12-03 (×2): qty 1

## 2020-12-03 MED ORDER — MAGNESIUM HYDROXIDE 400 MG/5ML PO SUSP: 30.0000 mL | Freq: Every day | ORAL | Status: DC | PRN

## 2020-12-03 MED ORDER — POTASSIUM CHLORIDE IN NACL 20-0.9 MEQ/L-% IV SOLN
INTRAVENOUS | Status: DC
  Filled 2020-12-03 (×5): qty 1000

## 2020-12-03 MED ORDER — CARVEDILOL 6.25 MG PO TABS
12.5000 mg | ORAL_TABLET | Freq: Two times a day (BID) | ORAL | Status: DC
  Administered 2020-12-04: 12.5 mg via ORAL
  Filled 2020-12-03: qty 2

## 2020-12-03 MED ORDER — SODIUM CHLORIDE 0.9 % IV BOLUS
1000.0000 mL | Freq: Once | INTRAVENOUS | Status: AC
  Administered 2020-12-03: 1000 mL via INTRAVENOUS

## 2020-12-03 MED ORDER — ACETAMINOPHEN 325 MG PO TABS: 650.0000 mg | ORAL_TABLET | Freq: Four times a day (QID) | ORAL | Status: DC | PRN

## 2020-12-03 MED ORDER — ENOXAPARIN SODIUM 40 MG/0.4ML ~~LOC~~ SOLN
40.0000 mg | SUBCUTANEOUS | Status: DC
  Administered 2020-12-04 – 2020-12-05 (×3): 40 mg via SUBCUTANEOUS
  Filled 2020-12-03 (×3): qty 0.4

## 2020-12-03 MED ORDER — TRAZODONE HCL 50 MG PO TABS: 25.0000 mg | ORAL_TABLET | Freq: Every evening | ORAL | Status: DC | PRN

## 2020-12-03 MED ORDER — ONDANSETRON HCL 4 MG/2ML IJ SOLN
4.0000 mg | Freq: Four times a day (QID) | INTRAMUSCULAR | Status: DC | PRN
  Administered 2020-12-07 – 2020-12-11 (×2): 4 mg via INTRAVENOUS
  Filled 2020-12-03 (×2): qty 2

## 2020-12-03 MED ORDER — ONDANSETRON HCL 4 MG PO TABS: 4.0000 mg | ORAL_TABLET | Freq: Four times a day (QID) | ORAL | Status: DC | PRN

## 2020-12-03 MED ORDER — PIPERACILLIN-TAZOBACTAM 3.375 G IVPB 30 MIN: 3.3750 g | Freq: Four times a day (QID) | INTRAVENOUS | Status: DC

## 2020-12-03 MED ORDER — POTASSIUM CHLORIDE 20 MEQ PO PACK
40.0000 meq | PACK | Freq: Once | ORAL | Status: DC
  Filled 2020-12-03: qty 2

## 2020-12-03 MED ORDER — INSULIN ASPART 100 UNIT/ML ~~LOC~~ SOLN: 0.0000 [IU] | Freq: Three times a day (TID) | SUBCUTANEOUS | Status: DC

## 2020-12-03 MED ORDER — SODIUM CHLORIDE 0.9 % IV SOLN
1.5000 g | Freq: Once | INTRAVENOUS | Status: AC
  Administered 2020-12-03: 1.5 g via INTRAVENOUS
  Filled 2020-12-03: qty 1.5

## 2020-12-03 MED ORDER — ONDANSETRON HCL 4 MG/2ML IJ SOLN
4.0000 mg | Freq: Once | INTRAMUSCULAR | Status: AC
  Administered 2020-12-03: 4 mg via INTRAVENOUS
  Filled 2020-12-03: qty 2

## 2020-12-03 MED ORDER — PIPERACILLIN-TAZOBACTAM 3.375 G IVPB
3.3750 g | Freq: Three times a day (TID) | INTRAVENOUS | Status: DC
  Administered 2020-12-04 (×2): 3.375 g via INTRAVENOUS
  Filled 2020-12-03 (×2): qty 50

## 2020-12-03 MED ORDER — SODIUM CHLORIDE 0.9 % IV SOLN: Freq: Once | INTRAVENOUS | Status: AC

## 2020-12-03 MED ORDER — SODIUM CHLORIDE 0.9 % IV SOLN
500.0000 mg | INTRAVENOUS | Status: DC
  Administered 2020-12-04: 500 mg via INTRAVENOUS
  Filled 2020-12-03: qty 500

## 2020-12-03 MED ORDER — LACTATED RINGERS IV BOLUS
1000.0000 mL | Freq: Once | INTRAVENOUS | Status: AC
  Administered 2020-12-04: 1000 mL via INTRAVENOUS

## 2020-12-03 NOTE — ED Triage Notes (Signed)
Pt arrives to ER via [facility] from home for dizziness/vomting x few days. Normal BM. Hx DM. CBG 133. Hasn't been eating/drinking last few days. Pt moaning upon arrival. EMS reports decrease in LOC past few days. Pt is bed bound.

## 2020-12-03 NOTE — ED Provider Notes (Signed)
[facility] Emergency Department Provider Note  Time seen: 2:41 PM  I have reviewed the triage vital signs and the nursing notes.   HISTORY  Chief Complaint Weakness/fatigue  HPI Chelsea Glass is a 65 y.o. female with a past medical history of arthritis, diabetes, hypertension, obesity, bed/wheelchair bound presents to the emergency department for increased fatigue weakness over the past 4 days.   According to the daughter for the past 4 days or so patient has been feeling very weak and fatigued, sleeping through most of the day has not been eating or drinking much.  States since September when she had COVID she has been largely bed wheelchair-bound.  She is working with physical therapy in the bed.  However over the past 4 days patient has been very somnolent, less responsive and less interactive.  Per daughter largely negative review of systems besides nausea with some dry heaving several days ago but none recently.  No known fever cough congestion shortness of breath.  No diarrhea.  No dysuria.  Past Medical History:  Diagnosis Date  . Arthritis   . Diabetes mellitus without complication (HCC)   . Hypertension     Patient Active Problem List   Diagnosis Date Noted  . Postmenopausal bleeding   . Hypertension   . Acute respiratory failure with hypoxia (HCC) 06/13/2020  . Obesity, Class III, BMI 40-49.9 (morbid obesity) (HCC) 06/13/2020  . Severe sepsis (HCC) 06/13/2020  . New onset atrial fibrillation (HCC) 06/13/2020  . Pneumonia due to COVID-19 virus 06/12/2020    Past Surgical History:  Procedure Laterality Date  . TUBAL LIGATION    . UTERINE FIBROID SURGERY      Prior to Admission medications   Medication Sig Start Date End Date Taking? Authorizing Provider  carvedilol (COREG) 12.5 MG tablet Take 1 tablet (12.5 mg total) by mouth 2 (two) times daily with a meal. 09/22/16   Darci Current, MD  diphenhydrAMINE (BENADRYL) 25 mg capsule Take 1  capsule (25 mg total) by mouth every 4 (four) hours as needed. 08/01/16 06/12/20  Emily Filbert, MD  glipiZIDE (GLUCOTROL XL) 10 MG 24 hr tablet Take 1 tablet (10 mg total) by mouth daily with breakfast. 06/27/20 07/27/20  Hughie Closs, MD  metFORMIN (GLUCOPHAGE-XR) 500 MG 24 hr tablet Take 1,000 mg by mouth daily with breakfast.    [provider]  ondansetron (ZOFRAN ODT) 4 MG disintegrating tablet Take 1 tablet (4 mg total) by mouth every 8 (eight) hours as needed. 09/21/20   Jene Every, MD  predniSONE (DELTASONE) 10 MG tablet Take 4 tablets (40 mg) p.o. daily for 2 days starting 06/28/2020, followed by 3 tablets (30 mg) p.o. daily for 3 days starting 06/30/2020, followed by 2 tablets (20 mg) p.o. daily for 3 days starting 07/03/2020 followed by 1 tablet p.o. daily for 3 days starting 07/03/2020 06/27/20   Hughie Closs, MD    Allergies  Allergen Reactions  . Lisinopril Swelling    History reviewed. No pertinent family history.  Social History Social History   Tobacco Use  . Smoking status: Never Smoker  . Smokeless tobacco: Never Used  Substance Use Topics  . Alcohol use: No  . Drug use: No    Review of Systems Constitutional: Negative for fever.  Generalized fatigue/weakness Cardiovascular: Negative for chest pain. Respiratory: Negative for shortness of breath. Gastrointestinal: Negative for abdominal pain.  Dry heaving several days ago, none since.  Decreased appetite per daughter. Genitourinary: Negative for urinary compaints Musculoskeletal:  Negative for musculoskeletal complaints Neurological: Negative for headache All other ROS negative  ____________________________________________   PHYSICAL EXAM:  VITAL SIGNS: ED Triage Vitals  Enc Vitals Group     BP 12/03/20 1125 128/82     Pulse Rate 12/03/20 1125 86     Resp 12/03/20 1125 16     Temp 12/03/20 1125 97.9 F (36.6 C)     Temp Source 12/03/20 1125 Oral     SpO2 12/03/20 1125 97 %     Weight  12/03/20 1124 211 lb 4.8 oz (95.8 kg)     Height 12/03/20 1124 5\' 5"  (1.651 m)     Head Circumference --      Peak Flow --      Pain Score --      Pain Loc --      Pain Edu? --      Excl. in GC? --     Constitutional: Patient awakens to voice, will answer simple questions such as yes/no questions appears to be accurate.  She does keep her eyes closed through most of the exam and appears fatigued. Eyes: Normal exam ENT      Head: Normocephalic and atraumatic.      Mouth/Throat: Mucous membranes are moist. Cardiovascular: Normal rate, regular rhythm.  Respiratory: Normal respiratory effort without tachypnea nor retractions. Breath sounds are clear  Gastrointestinal: Soft and nontender. No distention.   Musculoskeletal: Nontender with normal range of motion in all extremities.  Neurologic:  Normal speech and language. No gross focal neurologic deficits Skin:  Skin is warm, dry and intact.  Psychiatric: Mood and affect are normal.  ____________________________________________    EKG  EKG viewed and interpreted by myself shows a normal sinus rhythm 84 bpm with a narrow QRS, left axis deviation, largely normal intervals besides mild QTC prolongation.  Nonspecific ST changes.    INITIAL IMPRESSION / ASSESSMENT AND PLAN / ED COURSE  Pertinent labs & imaging results that were available during my care of the patient were reviewed by me and considered in my medical decision making (see chart for details).   Patient presents emergency department for increased weakness and fatigue per daughter over the past 4 days.  Patient is wheelchair and bedbound at baseline.  Largely negative review of systems.  Patient is somnolent/fatigue on examination but answers questions and follows commands.  Will fall asleep if not actively engaged.  Patient's lab work shows a slight leukocytosis, chemistry appears more consistent with dehydration than anything with mild renal insufficiency mild elevation of anion  gap mild hypokalemia.  We will IV hydrate, replete with oral potassium.  Covid swab is negative.  Vitals are reassuring.  Troponin is pending as well as urinalysis.  We will continue to IV hydrate while awaiting for the results.  Patient daughter agreeable to plan of care.  Patient's work-up is most consistent with dehydration so far.  Troponin is negative.  Covid is negative.  Urinalysis is pending.  Patient could possibly be discharged home if urine is normal after IV hydration.  If abnormal may require admission for UTI.  I discussed this with the daughter who is agreeable to this plan of care.  Chelsea Glass was evaluated in Emergency Department on 12/03/2020 for the symptoms described in the history of present illness. She was evaluated in the context of the global COVID-19 pandemic, which necessitated consideration that the patient might be at risk for infection with the SARS-CoV-2 virus that causes COVID-19. Institutional protocols and algorithms that pertain to  the evaluation of patients at risk for COVID-19 are in a state of rapid change based on information released by regulatory bodies including the CDC and federal and state organizations. These policies and algorithms were followed during the patient's care in the ED.  ____________________________________________   FINAL CLINICAL IMPRESSION(S) / ED DIAGNOSES  Weakness   Minna Antis, MD 12/03/20 1549

## 2020-12-03 NOTE — ED Notes (Signed)
Phlebotomy at bedside to collect troponin.

## 2020-12-03 NOTE — ED Provider Notes (Signed)
Patient received in signout.  Reportedly having episode of the past few days where she would like she was gagging or vomiting.  Has been more sleepy and altered.  Does have mild AKI.  CT imaging shows evidence of possible multilobar pneumonia versus possible aspiration given her history think aspiration is more likely.  Her Covid is negative.  Will order Unasyn after obtaining blood cultures.  Will discuss with hospitalist for admission.   Willy Eddy, MD 12/03/20 Ebony Cargo

## 2020-12-03 NOTE — ED Notes (Signed)
Patient in and out cathed by this RN, assisted by Jae Dire, Charity fundraiser. Patient's peri area cleaned thoroughly prior to insertion of cath. Yeast noted to be in and around patient's peri area.  Sterile technique used to obtain urine sample. Sample labeled and sent to lab.

## 2020-12-03 NOTE — Progress Notes (Signed)
Patient's heart was sustaining in the 140's. Chelsea Glass notified and appropriate orders were placed.  Will continue to monitor.  Chelsea Glass

## 2020-12-03 NOTE — ED Notes (Signed)
Patient transported to CT 

## 2020-12-03 NOTE — ED Notes (Signed)
Daughter / caregiver bedside, patient sleeping, NAD noted, VSS. Daughter states that her mother "has not been eating since she had Covid in September, has lost 100 lbs" and "hasn't been herself lately".

## 2020-12-03 NOTE — ED Notes (Signed)
Called lab to send a phlebotomist to collect blood.

## 2020-12-03 NOTE — H&P (Signed)
[place]   PATIENT NAME: Chelsea Glass    MR#:  [id number]  DATE OF BIRTH:  03/08/1956  DATE OF ADMISSION:  12/03/2020  PRIMARY CARE PHYSICIAN: [facility], [place]   Patient is coming from: Home  REQUESTING/REFERRING PHYSICIAN: Merlyn Lot, MD CHIEF COMPLAINT:   Chief Complaint  Patient presents with  . Vomiting    HISTORY OF PRESENT ILLNESS:  Chelsea Glass is a 65 y.o. African-American female with medical history significant for osteoarthritis, type 2 diabetes mellitus and hypertension, as well as COVID-19 in September 2021, who presented to the emergency room with acute onset of altered mental status and not acting herself for the last 4 days with excessive sleepiness. She has been having nausea and dry heaves without abdominal pain.  Her daughter stated that she has not eaten much and has been bed bound since September 2021 since her COVID-19 infection.  No dysuria, oliguria or hematuria or flank pain.  No chest pain or palpitations.  No headache or dizziness or blurred vision.  No paresthesias or focal muscle weakness.  ED Course: Vital signs were initially within normal.  Labs revealed hypokalemia 3.1 and BUN of 29 with creatinine 1.46 above previous levels, alk phos 3066 with albumin of 2.6 and total protein 6.4, total bili of 2.2 and lipase was 48.  Magnesium was 1.7.  High-sensitivity troponin I was 12 and CBC showed leukocytosis of 12.7. COVID-19 PCR and influenza antigens came back negative. EKG as reviewed by me : Showed sinus rhythm with rate of 84 with abnormal R wave progression with T wave inversion in V2 and Q waves in lead III Imaging: Noncontrasted head CT scan revealed mild parenchymal atrophy and chronic microvascular disease with no acute intracranial normalities.  Abdominal and pelvic CT scan revealed the following: 1. No evidence of obstructive uropathy or nephrolithiasis. 2. Calcific density within the dependent portion of the  right hand side of the urinary bladder may represent a collection of minute calculi/crystals. 3. Streaky and patchy airspace opacities with peripheral predominance in the bilateral lung bases. This may represent multifocal pneumonia or aspiration pneumonitis. Small left pleural effusion. 4. High density of the contents of the gallbladder, likely representing sludge. Please correlate to right upper quadrant ultrasound. 5. Leiomyomatous uterus. 6. Fat containing periumbilical anterior abdominal wall hernia. 7. Aortic atherosclerosis.  The patient was given 1 L bolus of IV normal saline followed 125 mL/h, 4 mg of IV Zofran and IV Unasyn.  She will be admitted to a medical monitored bed for further evaluation and management.  PAST MEDICAL HISTORY:   Past Medical History:  Diagnosis Date  . Arthritis   . Diabetes mellitus without complication ([place])   . Hypertension   -COVID-19 in September 2021  PAST SURGICAL HISTORY:   Past Surgical History:  Procedure Laterality Date  . TUBAL LIGATION    . UTERINE FIBROID SURGERY      SOCIAL HISTORY:   Social History   Tobacco Use  . Smoking status: Never Smoker  . Smokeless tobacco: Never Used  Substance Use Topics  . Alcohol use: No    FAMILY HISTORY:  Positive for diabetes mellitus and her mother and coronary artery disease in her mother and grandmother.  DRUG ALLERGIES:   Allergies  Allergen Reactions  . Lisinopril Swelling    REVIEW OF SYSTEMS:   ROS As per history of present illness. All pertinent systems were reviewed above. Constitutional, HEENT, cardiovascular, respiratory, GI, GU, musculoskeletal, neuro, psychiatric, endocrine, integumentary  and hematologic systems were reviewed and are otherwise negative/unremarkable except for positive findings mentioned above in the HPI.   MEDICATIONS AT HOME:   Prior to Admission medications   Medication Sig Start Date End Date Taking? Authorizing Provider  carvedilol  (COREG) 12.5 MG tablet Take 1 tablet (12.5 mg total) by mouth 2 (two) times daily with a meal. 09/22/16   Gregor Hams, MD  diphenhydrAMINE (BENADRYL) 25 mg capsule Take 1 capsule (25 mg total) by mouth every 4 (four) hours as needed. 08/01/16 06/12/20  Earleen Newport, MD  glipiZIDE (GLUCOTROL XL) 10 MG 24 hr tablet Take 1 tablet (10 mg total) by mouth daily with breakfast. 06/27/20 07/27/20  Darliss Cheney, MD  metFORMIN (GLUCOPHAGE-XR) 500 MG 24 hr tablet Take 1,000 mg by mouth daily with breakfast.    [provider]  ondansetron (ZOFRAN ODT) 4 MG disintegrating tablet Take 1 tablet (4 mg total) by mouth every 8 (eight) hours as needed. 09/21/20   Lavonia Drafts, MD  predniSONE (DELTASONE) 10 MG tablet Take 4 tablets (40 mg) p.o. daily for 2 days starting 06/28/2020, followed by 3 tablets (30 mg) p.o. daily for 3 days starting 06/30/2020, followed by 2 tablets (20 mg) p.o. daily for 3 days starting 07/03/2020 followed by 1 tablet p.o. daily for 3 days starting 07/03/2020 06/27/20   Darliss Cheney, MD      VITAL SIGNS:  Blood pressure 99/67, pulse 78, temperature 97.9 F (36.6 C), temperature source Oral, resp. rate 12, height 5' 5"  (1.651 m), weight 95.8 kg, SpO2 93 %.  PHYSICAL EXAMINATION:  Physical Exam  GENERAL:  65 y.o.-year-old African-American female patient lying in the bed with no acute distress.  EYES: Pupils equal, round, reactive to light and accommodation. No scleral icterus. Extraocular muscles intact.  HEENT: Head atraumatic, normocephalic. Oropharynx and nasopharynx clear.  NECK:  Supple, no jugular venous distention. No thyroid enlargement, no tenderness.  LUNGS: Diminished bibasal breath sounds with bibasal crackles. CARDIOVASCULAR: Regular rate and rhythm, S1, S2 normal. No murmurs, rubs, or gallops.  ABDOMEN: Soft, nondistended, nontender. Bowel sounds present. No organomegaly or mass.  EXTREMITIES: No pedal edema, cyanosis, or clubbing.  NEUROLOGIC: Cranial  nerves II through XII are intact. Muscle strength 5/5 in all extremities. Sensation intact. Gait not checked.  PSYCHIATRIC: The patient is alert and oriented x 3.  Normal affect and good eye contact. SKIN: No obvious rash, lesion, or ulcer.   LABORATORY PANEL:   CBC Recent Labs  Lab 12/03/20 1301  WBC 12.7*  HGB 13.1  HCT 38.4  PLT 268   ------------------------------------------------------------------------------------------------------------------  Chemistries  Recent Labs  Lab 12/03/20 1301  NA 136  K 3.1*  CL 102  CO2 18*  GLUCOSE 124*  BUN 29*  CREATININE 1.46*  CALCIUM 9.7  AST 37  ALT 37  ALKPHOS 366*  BILITOT 2.2*   ------------------------------------------------------------------------------------------------------------------  Cardiac Enzymes No results for input(s): TROPONINI in the last 168 hours. ------------------------------------------------------------------------------------------------------------------  RADIOLOGY:  CT ABDOMEN PELVIS WO CONTRAST  Result Date: 12/03/2020 CLINICAL DATA:  Nausea vomiting. EXAM: CT ABDOMEN AND PELVIS WITHOUT CONTRAST TECHNIQUE: Multidetector CT imaging of the abdomen and pelvis was performed following the standard protocol without IV contrast. COMPARISON:  None. FINDINGS: Lower chest: Streaky and patchy airspace opacities with peripheral predominance in the bilateral lung bases. Small left pleural effusion. Hepatobiliary: Normal appearance of the liver. High density of the contents of the gallbladder. Pancreas: Unremarkable. No pancreatic ductal dilatation or surrounding inflammatory changes. Spleen: Normal in size without focal  abnormality. Adrenals/Urinary Tract: Normal adrenal glands. Renal cysts. No evidence of obstructive uropathy or nephrolithiasis. Calcific density within the dependent portion of the right hand side of the urinary bladder may represent a collection of minute calculi/crystals. Stomach/Bowel: Stomach  is within normal limits. No evidence of appendicitis. No evidence of bowel wall thickening, distention, or inflammatory changes. Vascular/Lymphatic: Aortic atherosclerosis. No enlarged abdominal or pelvic lymph nodes. Reproductive: Leiomyomatous uterus.  No adnexal masses seen. Other: Fat containing periumbilical anterior abdominal wall hernia. Musculoskeletal: S shaped scoliosis and spondylosis of the lumbosacral spine. IMPRESSION: 1. No evidence of obstructive uropathy or nephrolithiasis. 2. Calcific density within the dependent portion of the right hand side of the urinary bladder may represent a collection of minute calculi/crystals. 3. Streaky and patchy airspace opacities with peripheral predominance in the bilateral lung bases. This may represent multifocal pneumonia or aspiration pneumonitis. Small left pleural effusion. 4. High density of the contents of the gallbladder, likely representing sludge. Please correlate to right upper quadrant ultrasound. 5. Leiomyomatous uterus. 6. Fat containing periumbilical anterior abdominal wall hernia. 7. Aortic atherosclerosis. Aortic Atherosclerosis (ICD10-I70.0). Electronically Signed   By: Fidela Salisbury M.D.   On: 12/03/2020 18:41   CT Head Wo Contrast  Result Date: 12/03/2020 CLINICAL DATA:  Neural deficit. EXAM: CT HEAD WITHOUT CONTRAST TECHNIQUE: Contiguous axial images were obtained from the base of the skull through the vertex without intravenous contrast. COMPARISON:  June 17, 2020 FINDINGS: Brain: No evidence of acute infarction, hemorrhage, hydrocephalus, extra-axial collection or mass lesion/mass effect. Mild brain parenchymal volume loss and deep white matter microangiopathy. Vascular: Calcific atherosclerotic disease of the intra cavernous carotid arteries. Skull: Normal. Negative for fracture or focal lesion. Sinuses/Orbits: No acute finding. Other: None. IMPRESSION: 1. No acute intracranial abnormality. 2. Mild brain parenchymal atrophy and  chronic microvascular disease. Electronically Signed   By: Fidela Salisbury M.D.   On: 12/03/2020 16:15      IMPRESSION AND PLAN:  Active Problems:   AKI (acute kidney injury) ([place])  1.  Aspiration bibasal pneumonia. -The patient will be admitted to a medical monitored bed. -Speech therapy consult to be obtained. -The patient will be hydrated with IV normal saline. -We will keep her n.p.o. for now. -We will place her on IV Zosyn and Zithromax. -The patient's daughter is seeking the possibility of G-tube placement since the patient has several dietitians consults as well as speech therapy consults in the recent past. -General surgery consult to be obtained.  2.  Gallbladder sludge with associated nausea and dry heaves and elevated alkaline phosphatase. -General surgery consult will be obtained. -I notified Dr. Celine Ahr about patient. -Serum lipase levels within normal at this time. -We will follow LFTs.  3.  Acute kidney injury likely prerenal. -The patient will be hydrated with IV normal saline. -We will follow BMPs. -We will avoid nephrotoxins.  4.  Metabolic encephalopathy, likely secondary to #1 through three. -We will follow neuro checks every 4 hours for 24 hours. -Management otherwise as above and will follow mental status.  5.  Hypokalemia. -Potassium will be replaced and magnesium will be optimized.  6.  Type 2 diabetes mellitus. -The patient will be placed on supplement coverage with NovoLog and will continue glipizide while holding off Metformin.  7.  Dyslipidemia. -We will hold off statin therapy at this time.  8.  Depression. -We will continue Cymbalta.  DVT prophylaxis: Lovenox. Code Status: full code.  This was discussed with the patient's daughter. Family Communication:  The plan of care was  discussed in details with the patient (and her daughter). I answered all questions. The patient agreed to proceed with the above mentioned plan. Further management  will depend upon hospital course. Disposition Plan: Back to previous home environment Consults called: General surgery consult to Dr. Celine Ahr. All the records are reviewed and case discussed with ED provider.  Status is: Inpatient  Remains inpatient appropriate because:Altered mental status, Ongoing diagnostic testing needed not appropriate for outpatient work up, Unsafe d/c plan, IV treatments appropriate due to intensity of illness or inability to take PO and Inpatient level of care appropriate due to severity of illness   Dispo: The patient is from: Home              Anticipated d/c is to: Home              Patient currently is not medically stable to d/c.   Difficult to place patient No   TOTAL TIME TAKING CARE OF THIS PATIENT: 55 minutes.    Christel Mormon M.D on 12/03/2020 at 7:47 PM  [facility]   From 7 PM-7 AM, contact night-coverage [email or web address]  CC: Primary care physician; [employer], [employer]

## 2020-12-03 NOTE — ED Notes (Signed)
This RN unable to obtain blood at this time. IV placed but will not draw. No other sites noted by this RN to be able to stick at this time.

## 2020-12-03 NOTE — ED Notes (Signed)
Pt had urinated in brief. Pads under pt were saturated with urine. With help from [facility] we were able to turn pt and clean with wipes and apply dry chucks under pt. Placed purewick at this time.

## 2020-12-03 NOTE — ED Notes (Signed)
Phlebotomy at bedside.

## 2020-12-03 NOTE — ED Notes (Signed)
Called lab to request assistance with blood culture draw.

## 2020-12-03 NOTE — ED Notes (Signed)
3 lab techs and 2 RN's attempted to obtain BC's, all unsuccessful. Mans[place], MD aware.

## 2020-12-04 ENCOUNTER — Inpatient Hospital Stay: Payer: Self-pay

## 2020-12-04 DIAGNOSIS — F32A Depression, unspecified: Secondary | ICD-10-CM

## 2020-12-04 DIAGNOSIS — R4182 Altered mental status, unspecified: Secondary | ICD-10-CM | POA: Diagnosis not present

## 2020-12-04 DIAGNOSIS — R7989 Other specified abnormal findings of blood chemistry: Secondary | ICD-10-CM | POA: Diagnosis not present

## 2020-12-04 DIAGNOSIS — U099 Post covid-19 condition, unspecified: Secondary | ICD-10-CM | POA: Diagnosis not present

## 2020-12-04 DIAGNOSIS — R627 Adult failure to thrive: Secondary | ICD-10-CM | POA: Diagnosis not present

## 2020-12-04 DIAGNOSIS — E876 Hypokalemia: Secondary | ICD-10-CM

## 2020-12-04 LAB — GLUCOSE, CAPILLARY
Glucose-Capillary: 103 mg/dL — ABNORMAL HIGH (ref 70–99)
Glucose-Capillary: 108 mg/dL — ABNORMAL HIGH (ref 70–99)
Glucose-Capillary: 95 mg/dL (ref 70–99)
Glucose-Capillary: 96 mg/dL (ref 70–99)
Glucose-Capillary: 96 mg/dL (ref 70–99)

## 2020-12-04 MED ORDER — DULOXETINE HCL 30 MG PO CPEP
30.0000 mg | ORAL_CAPSULE | Freq: Every day | ORAL | Status: DC
  Administered 2020-12-04: 30 mg via ORAL
  Filled 2020-12-04: qty 1

## 2020-12-04 MED ORDER — MIRTAZAPINE 15 MG PO TABS: 15.0000 mg | ORAL_TABLET | Freq: Every day | ORAL | Status: DC

## 2020-12-04 MED ORDER — METOPROLOL SUCCINATE ER 100 MG PO TB24: 100.0000 mg | ORAL_TABLET | Freq: Every day | ORAL | Status: DC

## 2020-12-04 MED ORDER — METOPROLOL SUCCINATE ER 25 MG PO TB24
25.0000 mg | ORAL_TABLET | Freq: Every day | ORAL | Status: DC
  Filled 2020-12-04: qty 1

## 2020-12-04 NOTE — Progress Notes (Signed)
Assessed BUE  For PICC placement.  All veins in RUE too small, with catheter occupancy 52% and greater with the tourniquet on.  LUE with brachial vein noncompressible very near axilla, with difficulty determining artery vs vein.  Pt also exhibiting great discomfor t with abducting BUE for PICC placement.  Recommend placing PICC centrally by IR.  Dr Allena Katz and Angelena Sole RN, and dtr notified.

## 2020-12-04 NOTE — Progress Notes (Signed)
[facility]  - [place] [facility]   PATIENT NAME: Chelsea Glass    MR#:  [id number]  DATE OF BIRTH:  29-Oct-1955  SUBJECTIVE:  patient appears to be a poor historian. She answers yes no. Unable to hold meaningful conversation. Keeps her eyes closed throughout when I was in the room.  Comes in from home lives with daughter has been pretty much bedbound since September 2021 after diagnosed with COVID. Went to [facility] couple times with admission and rehab times two since then.  Per daughter overall weight loss more than hundred pounds since last September. She has been steadily losing weight. Very poor PO intake. Variable intake of meds. Does not participated in any physical therapy. Deconditioned.  Denies any abdominal pain. Daughter brought patient in to see if patient can get feeding tube and nutrition be started.  No fever.  REVIEW OF SYSTEMS:   Review of Systems  Unable to perform ROS: Mental status change   Tolerating Diet: Tolerating PT: SNF  DRUG ALLERGIES:   Allergies  Allergen Reactions  . Lisinopril Swelling    VITALS:  Blood pressure 97/66, pulse 87, temperature 97.8 F (36.6 C), resp. rate 16, height 5\' 5"  (1.651 m), weight 95.8 kg, SpO2 99 %.  PHYSICAL EXAMINATION:   Physical Exam limited GENERAL:  65 y.o.-year-old patient lying in the bed with no acute distress.  LUNGS: Normal breath sounds bilaterally, no wheezing, rales, rhonchi. No use of accessory muscles of respiration.  CARDIOVASCULAR: S1, S2 normal. No murmurs, rubs, or gallops.  ABDOMEN: Soft, nontender, nondistended. Bowel sounds present.EXTREMITIES: No cyanosis, clubbing or edema b/l.    NEUROLOGIC: limited exam. Patient appears grossly nonfocal neuro- exam  PSYCHIATRIC:  patient is alert however keep size close. Mood appears depressed and affect flat  SKIN: No obvious rash, lesion, or ulcer.   per RN  LABORATORY PANEL:  CBC Recent Labs  Lab 12/03/20 1301  WBC 12.7*  HGB  13.1  HCT 38.4  PLT 268    Chemistries  Recent Labs  Lab 12/03/20 1301 12/03/20 1437  NA 136  --   K 3.1*  --   CL 102  --   CO2 18*  --   GLUCOSE 124*  --   BUN 29*  --   CREATININE 1.46*  --   CALCIUM 9.7  --   MG  --  1.7  AST 37  --   ALT 37  --   ALKPHOS 366*  --   BILITOT 2.2*  --    Cardiac Enzymes No results for input(s): TROPONINI in the last 168 hours. RADIOLOGY:  CT ABDOMEN PELVIS WO CONTRAST  Result Date: 12/03/2020 CLINICAL DATA:  Nausea vomiting. EXAM: CT ABDOMEN AND PELVIS WITHOUT CONTRAST TECHNIQUE: Multidetector CT imaging of the abdomen and pelvis was performed following the standard protocol without IV contrast. COMPARISON:  None. FINDINGS: Lower chest: Streaky and patchy airspace opacities with peripheral predominance in the bilateral lung bases. Small left pleural effusion. Hepatobiliary: Normal appearance of the liver. High density of the contents of the gallbladder. Pancreas: Unremarkable. No pancreatic ductal dilatation or surrounding inflammatory changes. Spleen: Normal in size without focal abnormality. Adrenals/Urinary Tract: Normal adrenal glands. Renal cysts. No evidence of obstructive uropathy or nephrolithiasis. Calcific density within the dependent portion of the right hand side of the urinary bladder may represent a collection of minute calculi/crystals. Stomach/Bowel: Stomach is within normal limits. No evidence of appendicitis. No evidence of bowel wall thickening, distention, or inflammatory changes. Vascular/Lymphatic: Aortic atherosclerosis.  No enlarged abdominal or pelvic lymph nodes. Reproductive: Leiomyomatous uterus.  No adnexal masses seen. Other: Fat containing periumbilical anterior abdominal wall hernia. Musculoskeletal: S shaped scoliosis and spondylosis of the lumbosacral spine. IMPRESSION: 1. No evidence of obstructive uropathy or nephrolithiasis. 2. Calcific density within the dependent portion of the right hand side of the urinary  bladder may represent a collection of minute calculi/crystals. 3. Streaky and patchy airspace opacities with peripheral predominance in the bilateral lung bases. This may represent multifocal pneumonia or aspiration pneumonitis. Small left pleural effusion. 4. High density of the contents of the gallbladder, likely representing sludge. Please correlate to right upper quadrant ultrasound. 5. Leiomyomatous uterus. 6. Fat containing periumbilical anterior abdominal wall hernia. 7. Aortic atherosclerosis. Aortic Atherosclerosis (ICD10-I70.0). Electronically Signed   By: Ted Mcalpine M.D.   On: 12/03/2020 18:41   CT Head Wo Contrast  Result Date: 12/03/2020 CLINICAL DATA:  Neural deficit. EXAM: CT HEAD WITHOUT CONTRAST TECHNIQUE: Contiguous axial images were obtained from the base of the skull through the vertex without intravenous contrast. COMPARISON:  June 17, 2020 FINDINGS: Brain: No evidence of acute infarction, hemorrhage, hydrocephalus, extra-axial collection or mass lesion/mass effect. Mild brain parenchymal volume loss and deep white matter microangiopathy. Vascular: Calcific atherosclerotic disease of the intra cavernous carotid arteries. Skull: Normal. Negative for fracture or focal lesion. Sinuses/Orbits: No acute finding. Other: None. IMPRESSION: 1. No acute intracranial abnormality. 2. Mild brain parenchymal atrophy and chronic microvascular disease. Electronically Signed   By: Ted Mcalpine M.D.   On: 12/03/2020 16:15   [place] EKG SITE RITE  Result Date: 12/04/2020 If [facility] image not attached, placement could not be confirmed due to current cardiac rhythm.  US Abdomen Limited RUQ (LIVER/GB)  Result Date: 12/03/2020 CLINICAL DATA:  Elevated liver function tests. EXAM: ULTRASOUND ABDOMEN LIMITED RIGHT UPPER QUADRANT COMPARISON:  None. FINDINGS: Gallbladder: Distended gallbladder is noted without gallbladder wall thickening or pericholecystic fluid. No sonographic Murphy's sign  is noted. No definite cholelithiasis is noted. Common bile duct: Not visualized.  Exam is limited due to body habitus. Liver: No focal lesion identified. Within normal limits in parenchymal echogenicity. Portal vein is patent on color Doppler imaging with normal direction of blood flow towards the liver. Other: None. IMPRESSION: Gallbladder distention is noted without definite evidence of cholelithiasis or cholecystitis. Common bile duct is not visualized due to body habitus. Electronically Signed   By: Lupita Raider M.D.   On: 12/03/2020 23:19   ASSESSMENT AND PLAN:  Chelsea Glass is a 65 y.o. African-American female with medical history significant for osteoarthritis, type 2 diabetes mellitus and hypertension, as well as COVID-19 in September 2021, who presented to the emergency room with acute onset of altered mental status and not acting herself for the last 4 days with excessive sleepiness. She has been having nausea and dry heaves without abdominal pain.  Her daughter stated that she has not eaten much and has been bed bound since September 2021 since her COVID-19 infection.    Adult failure to thrive/poor PO intake/generalized deconditioning Hypokalemia/depressed mood -- patient came in with ongoing weight loss and poor PO intake per daughter. Since COVID infection in September 2021 per daughter patient has been going downhill. -- She was admitted at [facility] after discharge from [facility] with adult failure to thrive and poor PO intake. Patient also went to rehab in [place]. -- She recently was seen in emergency room on 10th of February she was discharged to go home with home health  and daughter thinking patient will hopefully do better with her mood and eating. -- Daughter says patient has lost more than hundred pounds in September 2021. She has not ambulated a whole lot. Stays in bed most of the she is requesting if patient can get a feeding tube -- dietitian consult -- speech  therapy to see patient for swallow eval. -- after about will discussed with IR or G.I. to see if patient can get feeding tube per daughter's request. Discussed with daughter risk and complications of feeding tube, she voiced understanding  Abnormal ultrasound abdomen with gallbladder distention similar to previous imaging studies -- clinically patient does not appear to be septic or having any infection -- discontinue IV antibiotics-- no source of infection noted so far, chest x-ray negative for pneumonia. Appears to have some opacities from previous COVID infection.sats are more than 92% on room air -- patient was seen by surgery Dr. Lady Gary-- agree no surgical indication at present for gallbladder. Labs are stable.  Depressed mood -- continue Cymbalta -- consider psych consult -- patient has appointment for geriatric evaluation at [facility] per daughter in April 2022  Type II diabetes with sugars controlled -- sliding scale insulin -- hold PO diabetes meds  History of tachycardia/SVT -- patient follows with cardiology at [facility] -- recently started on Toprol-XL hundred milligrams daily. With soft blood pressure all decreased dose to 25 mg daily  Generalized deconditioning -- PT to see patient  Palliative care to have goals of discussion with patient and family  Procedures: Family communication : Dolores daughter on the phone.  Consults : surgery CODE STATUS: full code per daughter [name] DVT Prophylaxis : Lovenox Level of care: Med-Surg Status is: Inpatient  Remains inpatient appropriate because:IV treatments appropriate due to intensity of illness or inability to take PO and Inpatient level of care appropriate due to severity of illness   Dispo: The patient is from: Home              Anticipated d/c is to: TBD              Patient currently is not medically stable to d/c.   Difficult to place patient No        TOTAL TIME TAKING CARE OF THIS PATIENT: 25 minutes.  >50% time  spent on counselling and coordination of care  Note: This dictation was prepared with Dragon dictation along with smaller phrase technology. Any transcriptional errors that result from this process are unintentional.  Enedina Finner M.D    [facility]   CC: Primary care physician; [employer], [employer] Health ServicesPatient ID: Chelsea Glass, female   DOB: January 29, 1956, 65 y.o.   MRN: [id number]

## 2020-12-04 NOTE — Consult Note (Signed)
Reason for Consult: Gallbladder sludge seen on CT scan, nausea, dry heaves, daughter requesting G-tube  Referring Physician: Valente David, MD (hospital medicine)  Chelsea Glass is an 65 y.o. female.  HPI: Secondary to the patient's altered mental status, the majority of this history was obtained from a review of the electronic medical record.   She presented to the emergency department yesterday.  The emergency department physician's note is partially copied here:  "Chelsea Glass is a 65 y.o. female with a past medical history of arthritis, diabetes, hypertension, obesity, bed/wheelchair bound presents to the emergency department for increased fatigue weakness over the past 4 days.  According to the daughter for the past 4 days or so patient has been feeling very weak and fatigued, sleeping through most of the day has not been eating or drinking much.  States since September when she had COVID she has been largely bed wheelchair-bound.  She is working with physical therapy in the bed.  However over the past 4 days patient has been very somnolent, less responsive and less interactive.  Per daughter largely negative review of systems besides nausea with some dry heaving several days ago but none recently.  No known fever cough congestion shortness of breath.  No diarrhea.  No dysuria."  Vital signs in the emergency department were normal.  Labs were suggestive of dehydration.  A CT scan of the abdomen and pelvis was performed as there were reports that the patient had had some nausea and vomiting.  This reported high density contents in the gallbladder, thought to potentially represent sludge.  There was also concern for multifocal pneumonia versus aspiration pneumonitis.  A right upper quadrant ultrasound was ordered due to the CT scan findings, as well as elevated bilirubin.  This described a distended gallbladder without gallbladder wall thickening or pericholecystic fluid.  No cholelithiasis was  appreciated.  The common bile duct could not be visualized secondary to the patient's body habitus.  Upon further review of the electronic medical record, it appears that the patient was hospitalized in September 2021 with COVID-19.  She has subsequently had several additional hospital and emergency department visits.  She was at [facility] in [place] in October 2021, seen in the emergency department at [facility] in December 2021, and hospitalized again at [facility] in February of this year for weakness and malnutrition.  Reading the notes from this most recent admission, it appears that a number of [name] symptoms are being attributed to long-haul COVID.  This includes dysgeusia and dry heaving.  She was diagnosed with thrush and treated appropriately, but it appears that her oral intake has not significantly increased.  According to the electronic medical record, the patient's daughter reports a 100 pound weight loss since her Covid diagnosis.  She has been extremely deconditioned.  Mirtazapine had been initiated to try and help her appetite, but it appears that this was not successful.  PT/OT had been consulted, along with nutrition services.  Nutrition services' note dated November 18, 2020 indicates that the patient's daughter reported very poor oral intake and refusal of nutritional supplements.  No chewing or swallowing issues were reported, neither was nausea or vomiting indicated.  "Daughter states patient is turning away from food and does not even want it near her or offered."  Imaging at that admission included a CT scan that also described a distended gallbladder without internal cholelithiasis or any biliary dilatation.  Labs over the course of these several admissions have shown a persistent elevation in  alkaline phosphatase with relatively normal transaminases.  General surgery has been consulted in this somewhat complicated context for evaluation of the gallbladder findings, as well as  consideration of a feeding tube.   Past Medical History:  Diagnosis Date  . Arthritis   . Diabetes mellitus without complication (HCC)   . Hypertension     Past Surgical History:  Procedure Laterality Date  . TUBAL LIGATION    . UTERINE FIBROID SURGERY      History reviewed. No pertinent family history.  Social History:  reports that she has never smoked. She has never used smokeless tobacco. She reports that she does not drink alcohol and does not use drugs.  Allergies:  Allergies  Allergen Reactions  . Lisinopril Swelling    Medications: I have reviewed the patient's current medications.  Results for orders placed or performed during the hospital encounter of 12/03/20 (from the past 48 hour(s))  Lipase, blood     Status: None   Collection Time: 12/03/20  1:01 PM  Result Value Ref Range   Lipase 48 11 - 51 U/L    Comment: Performed at [facility], [address] [place], [place] [address]  Comprehensive metabolic panel     Status: Abnormal   Collection Time: 12/03/20  1:01 PM  Result Value Ref Range   Sodium 136 135 - 145 mmol/L   Potassium 3.1 (L) 3.5 - 5.1 mmol/L   Chloride 102 98 - 111 mmol/L   CO2 18 (L) 22 - 32 mmol/L   Glucose, Bld 124 (H) 70 - 99 mg/dL    Comment: Glucose reference range applies only to samples taken after fasting for at least 8 hours.   BUN 29 (H) 8 - 23 mg/dL   Creatinine, Ser 5.78 (H) 0.44 - 1.00 mg/dL   Calcium 9.7 8.9 - 46.9 mg/dL   Total Protein 6.4 (L) 6.5 - 8.1 g/dL   Albumin 2.6 (L) 3.5 - 5.0 g/dL   AST 37 15 - 41 U/L   ALT 37 0 - 44 U/L   Alkaline Phosphatase 366 (H) 38 - 126 U/L   Total Bilirubin 2.2 (H) 0.3 - 1.2 mg/dL   GFR, Estimated 39 (L) >60 mL/min    Comment: (NOTE) Calculated using the CKD-EPI Creatinine Equation (2021)    Anion gap 16 (H) 5 - 15    Comment: Performed at [facility], [address] [place], [place] [address]  CBC     Status: Abnormal   Collection Time: 12/03/20  1:01 PM   Result Value Ref Range   WBC 12.7 (H) 4.0 - 10.5 K/uL   RBC 4.63 3.87 - 5.11 MIL/uL   Hemoglobin 13.1 12.0 - 15.0 g/dL   HCT 84.1 32.4 - 40.1 %   MCV 82.9 80.0 - 100.0 fL   MCH 28.3 26.0 - 34.0 pg   MCHC 34.1 30.0 - 36.0 g/dL   RDW 02.7 (H) 25.3 - 66.4 %   Platelets 268 150 - 400 K/uL   nRBC 0.0 0.0 - 0.2 %    Comment: Performed at [facility], [address] [place], [place] [address]  Resp Panel by RT-PCR (Flu A&B, Covid) Nasopharyngeal Swab     Status: None   Collection Time: 12/03/20  1:35 PM   Specimen: Nasopharyngeal Swab; Nasopharyngeal(NP) swabs in vial transport medium  Result Value Ref Range   SARS Coronavirus 2 by RT PCR NEGATIVE NEGATIVE    Comment: (NOTE) SARS-CoV-2 target nucleic acids are NOT DETECTED.  The SARS-CoV-2 RNA is generally detectable in upper respiratory specimens during the acute phase of infection. The lowest concentration of SARS-CoV-2 viral copies this assay can detect is 138 copies/mL. A negative result does not preclude SARS-Cov-2 infection and should not be used as the sole basis for treatment or other patient management decisions. A negative result may occur with  improper specimen collection/handling, submission of specimen other than nasopharyngeal swab, presence of viral mutation(s) within the areas targeted by this assay, and inadequate number of viral copies(<138 copies/mL). A negative result must be combined with clinical observations, patient history, and epidemiological information. The expected result is Negative.  Fact Sheet for Patients:  BloggerCourse.comhttps://www.fda.gov/media/152166/download  Fact Sheet for Healthcare Providers:  SeriousBroker.ithttps://www.fda.gov/media/152162/download  This test is no t yet approved or cleared by the [place] FDA and  has been authorized for detection and/or diagnosis of SARS-CoV-2 by FDA under an Emergency Use Authorization (EUA). This EUA will remain  in effect (meaning this test can be used) for the  duration of the COVID-19 declaration under Section 564(b)(1) of the Act, 21 U.S.C.section 360bbb-3(b)(1), unless the authorization is terminated  or revoked sooner.       Influenza A by PCR NEGATIVE NEGATIVE   Influenza B by PCR NEGATIVE NEGATIVE    Comment: (NOTE) The Xpert Xpress SARS-CoV-2/FLU/RSV plus assay is intended as an aid in the diagnosis of influenza from Nasopharyngeal swab specimens and should not be used as a sole basis for treatment. Nasal washings and aspirates are unacceptable for Xpert Xpress SARS-CoV-2/FLU/RSV testing.  Fact Sheet for Patients: BloggerCourse.comhttps://www.fda.gov/media/152166/download  Fact Sheet for Healthcare Providers: SeriousBroker.ithttps://www.fda.gov/media/152162/download  This test is not yet approved or cleared by the [place] FDA and has been authorized for detection and/or diagnosis of SARS-CoV-2 by FDA under an Emergency Use Authorization (EUA). This EUA will remain in effect (meaning this test can be used) for the duration of the COVID-19 declaration under Section 564(b)(1) of the Act, 21 U.S.C. section 360bbb-3(b)(1), unless the authorization is terminated or revoked.  Performed at [facility], [address] [place], [place] [address]   Troponin I (High Sensitivity)     Status: None   Collection Time: 12/03/20  2:37 PM  Result Value Ref Range   Troponin I (High Sensitivity) 12 <18 ng/L    Comment: (NOTE) Elevated high sensitivity troponin I (hsTnI) values and significant  changes across serial measurements may suggest ACS but many other  chronic and acute conditions are known to elevate hsTnI results.  Refer to the "Links" section for chest pain algorithms and additional  guidance. Performed at [facility], [address] [place], [place] [address]   Magnesium     Status: None   Collection Time: 12/03/20  2:37 PM  Result Value Ref Range   Magnesium 1.7 1.7 - 2.4 mg/dL    Comment: Performed at [facility],  [address] [place], [place] [address]  Urinalysis, Complete w Microscopic Urine, Catheterized     Status: Abnormal   Collection Time: 12/03/20  4:25 PM  Result Value Ref Range   Color, Urine AMBER (A) YELLOW    Comment: BIOCHEMICALS MAY BE AFFECTED BY COLOR   APPearance CLOUDY (A) CLEAR   Specific Gravity, Urine 1.021 1.005 - 1.030   pH 5.0 5.0 - 8.0   Glucose, UA 50 (A) NEGATIVE mg/dL   Hgb urine dipstick NEGATIVE NEGATIVE   Bilirubin Urine NEGATIVE NEGATIVE   Ketones, ur 20 (A) NEGATIVE mg/dL   Protein, ur 433100 (A)  NEGATIVE mg/dL   Nitrite NEGATIVE NEGATIVE   Leukocytes,Ua NEGATIVE NEGATIVE   RBC / HPF 0-5 0 - 5 RBC/hpf   WBC, UA 0-5 0 - 5 WBC/hpf   Bacteria, UA NONE SEEN NONE SEEN   Squamous Epithelial / LPF 6-10 0 - 5    Comment: Performed at [facility], [address] [place], [place] [address]  Glucose, capillary     Status: Abnormal   Collection Time: 12/04/20 12:26 AM  Result Value Ref Range   Glucose-Capillary 108 (H) 70 - 99 mg/dL    Comment: Glucose reference range applies only to samples taken after fasting for at least 8 hours.  Glucose, capillary     Status: None   Collection Time: 12/04/20  7:54 AM  Result Value Ref Range   Glucose-Capillary 96 70 - 99 mg/dL    Comment: Glucose reference range applies only to samples taken after fasting for at least 8 hours.    CT ABDOMEN PELVIS WO CONTRAST  Result Date: 12/03/2020 CLINICAL DATA:  Nausea vomiting. EXAM: CT ABDOMEN AND PELVIS WITHOUT CONTRAST TECHNIQUE: Multidetector CT imaging of the abdomen and pelvis was performed following the standard protocol without IV contrast. COMPARISON:  None. FINDINGS: Lower chest: Streaky and patchy airspace opacities with peripheral predominance in the bilateral lung bases. Small left pleural effusion. Hepatobiliary: Normal appearance of the liver. High density of the contents of the gallbladder. Pancreas: Unremarkable. No pancreatic ductal dilatation or surrounding  inflammatory changes. Spleen: Normal in size without focal abnormality. Adrenals/Urinary Tract: Normal adrenal glands. Renal cysts. No evidence of obstructive uropathy or nephrolithiasis. Calcific density within the dependent portion of the right hand side of the urinary bladder may represent a collection of minute calculi/crystals. Stomach/Bowel: Stomach is within normal limits. No evidence of appendicitis. No evidence of bowel wall thickening, distention, or inflammatory changes. Vascular/Lymphatic: Aortic atherosclerosis. No enlarged abdominal or pelvic lymph nodes. Reproductive: Leiomyomatous uterus.  No adnexal masses seen. Other: Fat containing periumbilical anterior abdominal wall hernia. Musculoskeletal: S shaped scoliosis and spondylosis of the lumbosacral spine. IMPRESSION: 1. No evidence of obstructive uropathy or nephrolithiasis. 2. Calcific density within the dependent portion of the right hand side of the urinary bladder may represent a collection of minute calculi/crystals. 3. Streaky and patchy airspace opacities with peripheral predominance in the bilateral lung bases. This may represent multifocal pneumonia or aspiration pneumonitis. Small left pleural effusion. 4. High density of the contents of the gallbladder, likely representing sludge. Please correlate to right upper quadrant ultrasound. 5. Leiomyomatous uterus. 6. Fat containing periumbilical anterior abdominal wall hernia. 7. Aortic atherosclerosis. Aortic Atherosclerosis (ICD10-I70.0). Electronically Signed   By: Ted Mcalpine M.D.   On: 12/03/2020 18:41   CT Head Wo Contrast  Result Date: 12/03/2020 CLINICAL DATA:  Neural deficit. EXAM: CT HEAD WITHOUT CONTRAST TECHNIQUE: Contiguous axial images were obtained from the base of the skull through the vertex without intravenous contrast. COMPARISON:  June 17, 2020 FINDINGS: Brain: No evidence of acute infarction, hemorrhage, hydrocephalus, extra-axial collection or mass  lesion/mass effect. Mild brain parenchymal volume loss and deep white matter microangiopathy. Vascular: Calcific atherosclerotic disease of the intra cavernous carotid arteries. Skull: Normal. Negative for fracture or focal lesion. Sinuses/Orbits: No acute finding. Other: None. IMPRESSION: 1. No acute intracranial abnormality. 2. Mild brain parenchymal atrophy and chronic microvascular disease. Electronically Signed   By: Ted Mcalpine M.D.   On: 12/03/2020 16:15   [place] EKG SITE RITE  Result Date: 12/04/2020 If [facility] image not attached, placement could  not be confirmed due to current cardiac rhythm.  US Abdomen Limited RUQ (LIVER/GB)  Result Date: 12/03/2020 CLINICAL DATA:  Elevated liver function tests. EXAM: ULTRASOUND ABDOMEN LIMITED RIGHT UPPER QUADRANT COMPARISON:  None. FINDINGS: Gallbladder: Distended gallbladder is noted without gallbladder wall thickening or pericholecystic fluid. No sonographic Murphy's sign is noted. No definite cholelithiasis is noted. Common bile duct: Not visualized.  Exam is limited due to body habitus. Liver: No focal lesion identified. Within normal limits in parenchymal echogenicity. Portal vein is patent on color Doppler imaging with normal direction of blood flow towards the liver. Other: None. IMPRESSION: Gallbladder distention is noted without definite evidence of cholelithiasis or cholecystitis. Common bile duct is not visualized due to body habitus. Electronically Signed   By: Lupita Raider M.D.   On: 12/03/2020 23:19    Review of Systems  Unable to perform ROS: Mental status change   Blood pressure 136/81, pulse 81, temperature (!) 97.5 F (36.4 C), temperature source Oral, resp. rate 16, height 5\' 5"  (1.651 m), weight 95.8 kg, SpO2 99 %. Physical Exam Constitutional:      Comments: She is drowsy, but opens her eyes to voice.  She gives yes and no answers to simple questions; her answers do seem to be reliable  HENT:     Head: Normocephalic and  atraumatic.     Nose: Nose normal.  Eyes:     General:        Right eye: No discharge.        Left eye: No discharge.     Conjunctiva/sclera: Conjunctivae normal.  Cardiovascular:     Rate and Rhythm: Normal rate and regular rhythm.  Pulmonary:     Effort: Pulmonary effort is normal. No respiratory distress.  Abdominal:     General: There is no distension.     Palpations: Abdomen is soft.     Tenderness: There is no abdominal tenderness. There is no guarding or rebound.  Genitourinary:    Comments: Deferred Musculoskeletal:        General: No deformity.  Skin:    General: Skin is warm and dry.  Neurological:     General: No focal deficit present.  Psychiatric:        Behavior: Behavior normal.     Assessment/Plan: This is a 65 year old woman who had COVID-19 in September 2021.  She appears to have had a general and progressive decline in her overall health status since that time, including a recent admission for malnutrition and failure to thrive at [facility].  Her symptoms are currently being attributed to long-haul COVID-19.  General surgery has been consulted regarding the gallbladder findings as well as to consider possible feeding tube placement.  In regards to her gallbladder, I do not think this is the source of any of her symptoms.  It has had a consistent appearance on multiple imaging studies.  There is no gallbladder wall thickening, no pericholecystic fluid, no intrahepatic or extrahepatic biliary dilatation, no cholelithiasis.  It is likely distended on imaging studies because she has had poor oral intake and therefore no reason for the gallbladder to contract.  I do not think surgical intervention is warranted.  In terms of her poor nutritional status, a feeding tube is certainly an option, as it appears that multiple medical modalities have been attempted to try and increase her appetite.  After reviewing her CT scan, she appears to have an acceptable window for  percutaneous approach, therefore, I would recommend consultation with gastroenterology  or interventional radiology, rather than surgical gastrostomy tube placement.  No role for general surgery involvement at this time.  We will sign off.  Note: I spent over 90 minutes on this consultation, greater than 50% of which involved extensive review of medical records and coordination of patient care.  Duanne Guess 12/04/2020, 11:09 AM

## 2020-12-04 NOTE — Progress Notes (Signed)
Medication Reconciliation Report  For Home History Technicians  HIGHLIGHTS:  1. The patient WAS NOT personally interviewed 2. If not, what was the main source used: FAMILY/CAREGIVER TESTIMONY/DOCS 3. Does the patient appear to take any anti-coagulation agents (e.g. warfarin, Eliquis or Xarelto): NO 4. Does the patient appear to take any anti-convulsant agents (e.g. divalproex, levetiracetam or phenytoin): NO 5. Does the patient appear to use any insulin products (e.g. Lantus, Novolin or Humalog): NO 6. Does the patient appear to take any "beta-blockers" (e.g. metoprolol, carvedilol or bisoprolol: YES  BARRIERS:  1. Were there any barriers that prevented or complicated the medication reconciliation process: YES 2. If yes, what was the primary barrier encountered: None 3. Does the patient appear compliant with prescribed medications: NO 4. Does the patient express any barriers with compliance: UNABLE TO DETERMINE 5. What is the primary barrier the patient reports: None   NOTES:[Include any concerns, remarks or complaints the patient expresses regarding medication therapy. Any observations or other information that might be useful to the treatment team can also be included. Immediate needs or concerns should be referred to the RN or appropriate member of the treatment team.]  Patient was not interviewed secondary to minimal interaction. Contacted patient's daughter (Deloris Deretha Emory) who reported medications as reflected in med reconciliation tab. Currently not taking any diabetes, blood pressure or other depression medications.  Elmo Putt, CPhT [place] at [facility] [address] [place], [place] [address] [phone]  ** The above is intended solely for informational and/or communicative purposes. It should in no way be considered an endorsement of any specific treatment, therapy or action. **

## 2020-12-04 NOTE — Progress Notes (Signed)
Pt has not void throughout the shift. Bladder scan showed 280 mls. Pt denies having the urge to pee.  MD Allena Katz made aware.

## 2020-12-04 NOTE — Progress Notes (Signed)
Bladder scanned patient because she hasn't urinated all night. Total output was 281 mL. She has continuous fluids running and had an LR bolus.  Will continue to monitor.  Arturo Morton

## 2020-12-04 NOTE — Evaluation (Addendum)
Physical Therapy Evaluation Patient Details Name: Chelsea Glass MRN: [id number] DOB: May 22, 1956 Today's Date: 12/04/2020   History of Present Illness  Pt is a 65 y/o F admitted from home on 12/03/20 with c/c of AMS, excessive sleepiness, nausea & dry heaves. Pt currently being treated for aspiration bibasal PNA & gallbladder sludge with associated nausea & dry heaves. PMH: OA, DM2, HTN, Covid 19 in September 2021    Clinical Impression  Pt seen for PT evaluation but pt very lethargic despite consistent attempts from PT to increase alertness. PT attempts to facilitate supine>sit but pt with no active participation, instead resisting at times so movement deferred. Pt would benefit from STR upon d/c to maximize independence with functional mobility & reduce caregiver burden. Will continue to follow pt acutely to focus on bed mobility & transfers as able, as well as activity tolerance.   Addendum: Prior to PT evaluation MD cleared pt for participation in setting of low K+ levels (3.1) from yesterday. Pt maintains cervical rotation to R despite max cuing to achieve neutral; pt eventually will attempt to turn head to neutral but doesn't turn to L at any point.      Follow Up Recommendations SNF    Equipment Recommendations   (TBD in next venue)    Recommendations for Other Services       Precautions / Restrictions Precautions Precautions: Fall Restrictions Weight Bearing Restrictions: No      Mobility  Bed Mobility Overal bed mobility: Needs Assistance Bed Mobility: Supine to Sit     Supine to sit: Total assist;HOB elevated     General bed mobility comments: PT initiates supine>sit but pt does not participate & briefly resistts & attempts to lie back down so movement deferred.    Transfers                    Ambulation/Gait                Stairs            Wheelchair Mobility    Modified Rankin (Stroke Patients Only)       Balance                                              Pertinent Vitals/Pain Pain Assessment: Faces Faces Pain Scale: Hurts even more Pain Location: grimacing when PT assists with supine>sit, no specific location noted Pain Intervention(s): Repositioned    Home Living Family/patient expects to be discharged to:: Private residence Living Arrangements: Children Available Help at Discharge: Family Type of Home: House Home Access: Stairs to enter Entrance Stairs-Rails: Right Entrance Stairs-Number of Steps: 3 Home Layout: One level        Prior Function           Comments: Pt unable to provide home setup/PLOF information & unable to reach pt's daughter via telephone so information taken from chart. Per chart, pt has been bedbound since having covid in September.     Hand Dominance        Extremity/Trunk Assessment   Upper Extremity Assessment Upper Extremity Assessment: Generalized weakness (no active movement noted)    Lower Extremity Assessment Lower Extremity Assessment: Generalized weakness (pt lying with BLE externally rotated with PT able to move to neutral position but pt grimacing; pt does not actively move BLE)       Communication  Cognition Arousal/Alertness: Lethargic   Overall Cognitive Status: No family/caregiver present to determine baseline cognitive functioning Area of Impairment: Orientation;Attention;Memory;Safety/judgement;Problem solving                 Orientation Level: Person;Place (able to state name & that she's in the "hospital" but doesn't elaborate) Current Attention Level: Focused;Sustained Memory: Decreased recall of precautions;Decreased short-term memory   Safety/Judgement: Decreased awareness of deficits;Decreased awareness of safety   Problem Solving: Slow processing;Decreased initiation;Requires verbal cues;Requires tactile cues General Comments: Pt requries MAX verbal encouragement & cold wet cloth placed on forehead  to attempt to open eyes during session.      General Comments      Exercises     Assessment/Plan    PT Assessment Patient needs continued PT services  PT Problem List Decreased strength;Decreased mobility;Decreased safety awareness;Decreased range of motion;Obesity;Decreased activity tolerance;Decreased cognition;Cardiopulmonary status limiting activity;Decreased skin integrity;Pain;Impaired sensation;Decreased knowledge of use of DME;Decreased balance       PT Treatment Interventions DME instruction;Cognitive remediation;Therapeutic activities;Patient/family education;Therapeutic exercise;Gait training;Stair training;Modalities;Balance training;Wheelchair mobility training;Functional mobility training;Neuromuscular re-education;Manual techniques    PT Goals (Current goals can be found in the Care Plan section)  Acute Rehab PT Goals PT Goal Formulation: Patient unable to participate in goal setting (no family present) Time For Goal Achievement: 12/18/20    Frequency Min 2X/week   Barriers to discharge        Co-evaluation               AM-PAC PT "6 Clicks" Mobility  Outcome Measure Help needed turning from your back to your side while in a flat bed without using bedrails?: Total Help needed moving from lying on your back to sitting on the side of a flat bed without using bedrails?: Total Help needed moving to and from a bed to a chair (including a wheelchair)?: Total Help needed standing up from a chair using your arms (e.g., wheelchair or bedside chair)?: Total Help needed to walk in hospital room?: Total Help needed climbing 3-5 steps with a railing? : Total 6 Click Score: 6    End of Session   Activity Tolerance: Patient limited by lethargy Patient left: in bed;with bed alarm set;with call bell/phone within reach   PT Visit Diagnosis: Muscle weakness (generalized) (M62.81);Difficulty in walking, not elsewhere classified (R26.2)    Time: 1213-1223 PT Time  Calculation (min) (ACUTE ONLY): 10 min   Charges:   PT Evaluation $PT Eval Low Complexity: 1 Low          Aleda Grana, PT, DPT 12/04/20, 1:59 PM   Sandi Mariscal 12/04/2020, 12:49 PM

## 2020-12-04 NOTE — Progress Notes (Signed)
Patient started dry heaving after taking a sip of the potassium chloride powder mixture.  She could not tolerate drinking the rest of the medicine.    Heart rate is now in the 90's after fluids were started. She is now receiving an LR bolus.  Will continue to monitor.  Arturo Morton  12/04/2020 12:51 AM

## 2020-12-05 ENCOUNTER — Other Ambulatory Visit: Payer: Medicare Other

## 2020-12-05 ENCOUNTER — Inpatient Hospital Stay: Payer: Medicare Other

## 2020-12-05 DIAGNOSIS — R634 Abnormal weight loss: Secondary | ICD-10-CM

## 2020-12-05 LAB — CBC WITH DIFFERENTIAL/PLATELET
Abs Immature Granulocytes: 0.28 10*3/uL — ABNORMAL HIGH (ref 0.00–0.07)
Basophils Absolute: 0.1 10*3/uL (ref 0.0–0.1)
Basophils Relative: 1 %
Eosinophils Absolute: 0.2 10*3/uL (ref 0.0–0.5)
Eosinophils Relative: 1 %
HCT: 37.9 % (ref 36.0–46.0)
Hemoglobin: 13.1 g/dL (ref 12.0–15.0)
Immature Granulocytes: 2 %
Lymphocytes Relative: 25 %
Lymphs Abs: 3.1 10*3/uL (ref 0.7–4.0)
MCH: 28.7 pg (ref 26.0–34.0)
MCHC: 34.6 g/dL (ref 30.0–36.0)
MCV: 83.1 fL (ref 80.0–100.0)
Monocytes Absolute: 1.7 10*3/uL — ABNORMAL HIGH (ref 0.1–1.0)
Monocytes Relative: 14 %
Neutro Abs: 6.9 10*3/uL (ref 1.7–7.7)
Neutrophils Relative %: 57 %
Platelets: 235 10*3/uL (ref 150–400)
RBC: 4.56 MIL/uL (ref 3.87–5.11)
RDW: 19.1 % — ABNORMAL HIGH (ref 11.5–15.5)
WBC: 12.2 10*3/uL — ABNORMAL HIGH (ref 4.0–10.5)
nRBC: 0 % (ref 0.0–0.2)

## 2020-12-05 LAB — GLUCOSE, CAPILLARY
Glucose-Capillary: 86 mg/dL (ref 70–99)
Glucose-Capillary: 83 mg/dL (ref 70–99)
Glucose-Capillary: 111 mg/dL — ABNORMAL HIGH (ref 70–99)
Glucose-Capillary: 105 mg/dL — ABNORMAL HIGH (ref 70–99)

## 2020-12-05 LAB — MAGNESIUM: Magnesium: 1.7 mg/dL (ref 1.7–2.4)

## 2020-12-05 LAB — HEMOGLOBIN A1C
Hgb A1c MFr Bld: 4.6 % — ABNORMAL LOW (ref 4.8–5.6)
Mean Plasma Glucose: 85.32 mg/dL

## 2020-12-05 LAB — PROCALCITONIN: Procalcitonin: 0.17 ng/mL

## 2020-12-05 LAB — PHOSPHORUS: Phosphorus: 2.8 mg/dL (ref 2.5–4.6)

## 2020-12-05 LAB — TSH: TSH: 4.688 u[IU]/mL — ABNORMAL HIGH (ref 0.350–4.500)

## 2020-12-05 IMAGING — MR MR HEAD W/O CM
10 series · 48 of 48 positions shown · non-contrast
Comparison: CT head [DATE]

CLINICAL DATA: Rule out stroke.  Diabetes and hypertension

EXAM:
MRI HEAD WITHOUT CONTRAST
TECHNIQUE: Multiplanar, multiecho pulse sequences of the brain and surrounding
structures were obtained without intravenous contrast.

[Series 2: ax dwi_tracew · axial · 3.0mm · 1.31mm/px · z∈[-96,+55]mm · 8 of 48 slices shown]
[im 1/48]
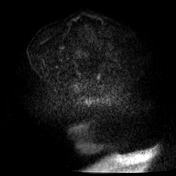
[im 7/48]
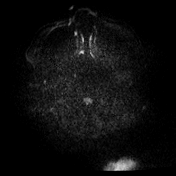
[im 14/48]
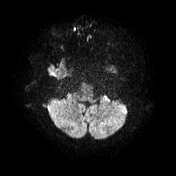
[im 21/48]
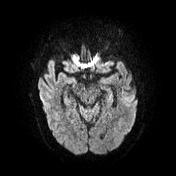
[im 27/48]
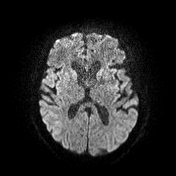
[im 34/48]
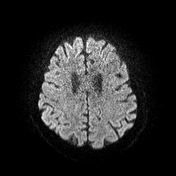
[im 41/48]
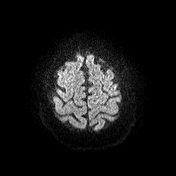
[im 48/48]
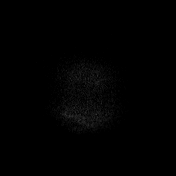

[Series 3: ax dwi_adc · axial · 3.0mm · 1.31mm/px · z∈[-96,+55]mm · 7 of 48 slices shown]
[im 1/48]
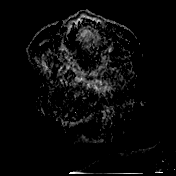
[im 8/48]
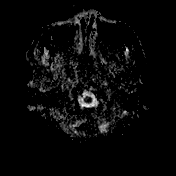
[im 16/48]
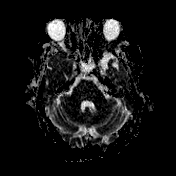
[im 24/48]
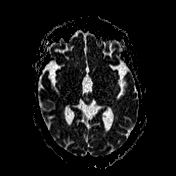
[im 32/48]
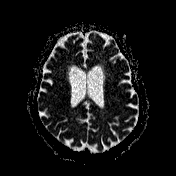
[im 40/48]
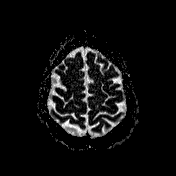
[im 48/48]
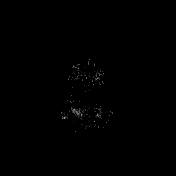

[Series 4: cor dwi_tracew · coronal · 5.0mm · 1.31mm/px · 5 of 38 slices shown]
[im 1/38]
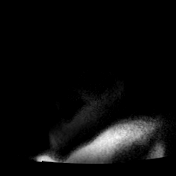
[im 10/38]
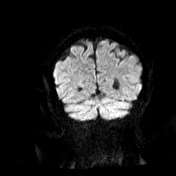
[im 19/38]
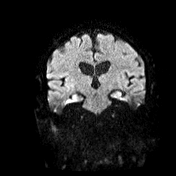
[im 28/38]
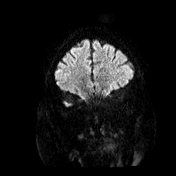
[im 38/38]
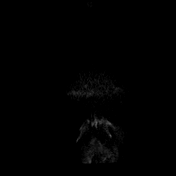

[Series 5: cor dwi_adc · coronal · 5.0mm · 1.31mm/px · 5 of 38 slices shown]
[im 1/38]
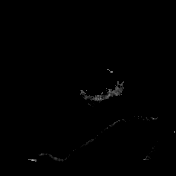
[im 10/38]
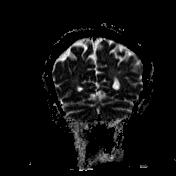
[im 19/38]
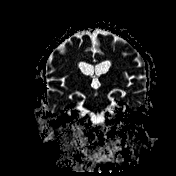
[im 28/38]
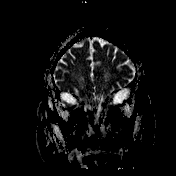
[im 38/38]
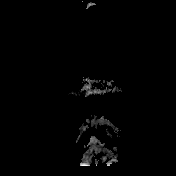

[Series 6: T1 · sagittal · 5.0mm · 0.94mm/px · 3 of 23 slices shown (1 of 2)]
[im 1/23]
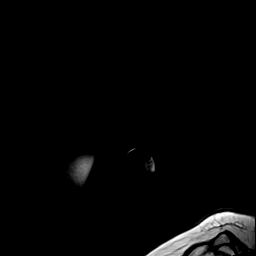
[im 12/23]
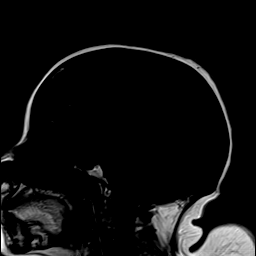
[im 23/23]
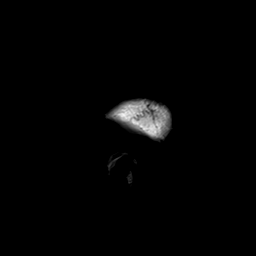

[Series 7: T2 · axial · 5.0mm · 0.45mm/px · z∈[-98,+54]mm · 4 of 27 slices shown (1 of 2)]
[im 1/27]
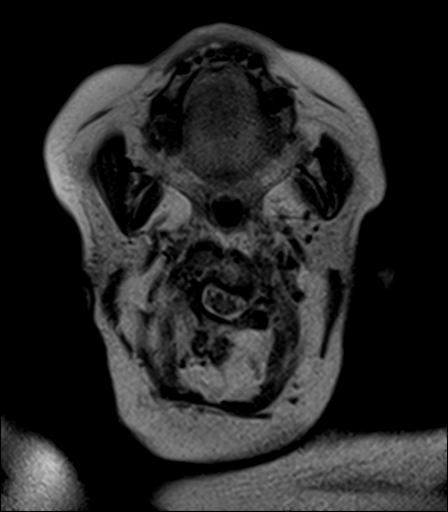
[im 9/27]
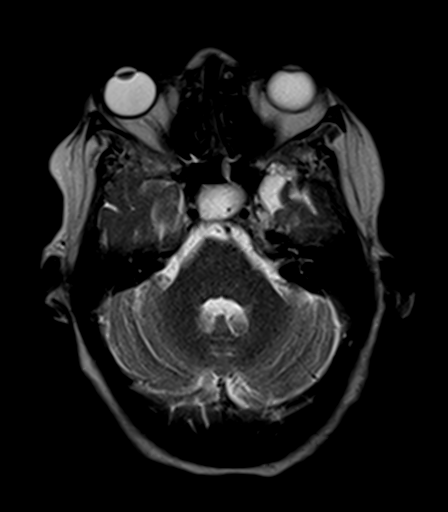
[im 18/27]
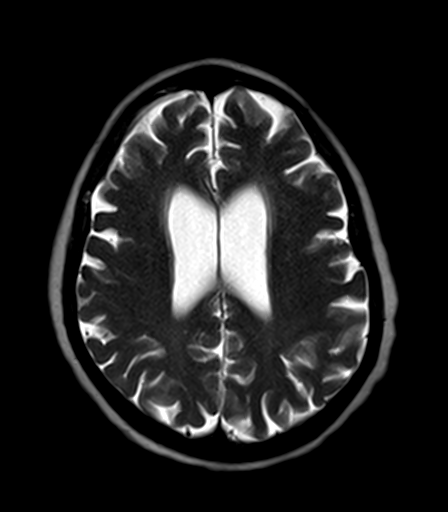
[im 27/27]
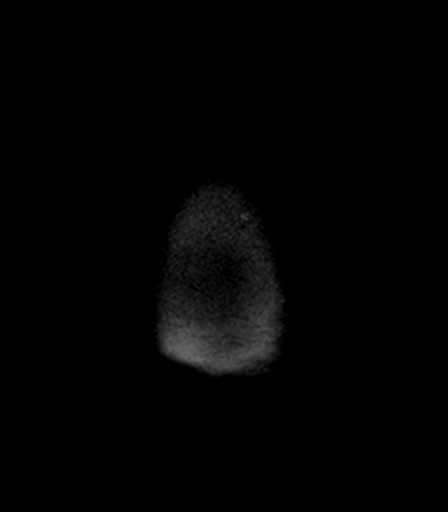

[Series 8: T2-star · axial · 5.0mm · 0.45mm/px · z∈[-98,+54]mm · 4 of 27 slices shown]
[im 1/27]
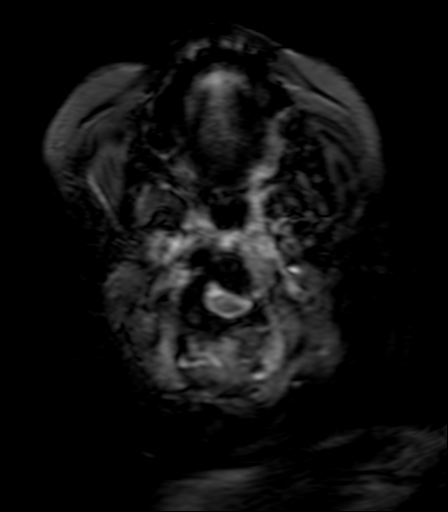
[im 9/27]
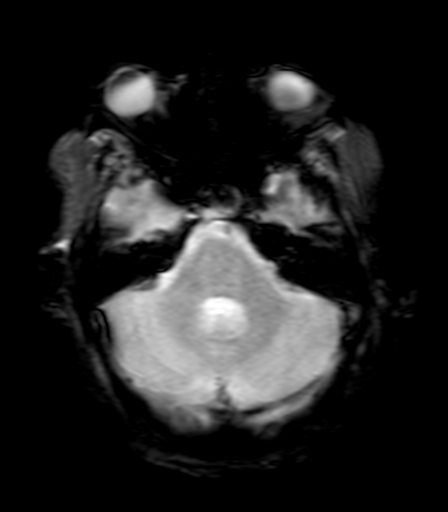
[im 18/27]
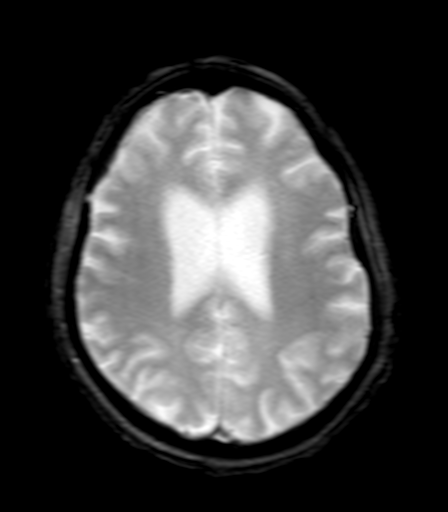
[im 27/27]
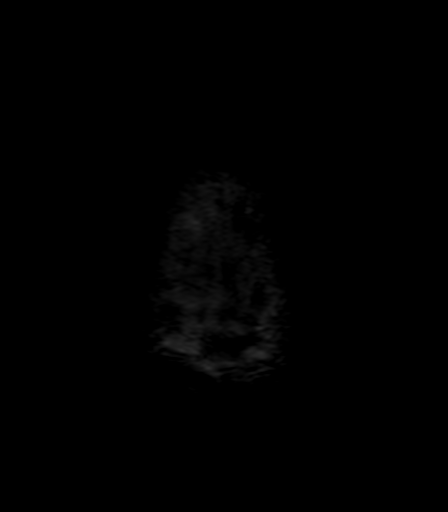

[Series 9: FLAIR · axial · 5.0mm · 1.20mm/px · z∈[-96,+55]mm · 4 of 26 slices shown]
[im 1/26]
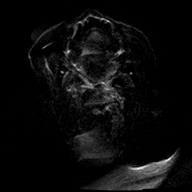
[im 9/26]
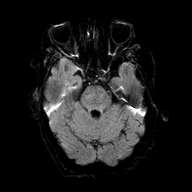
[im 17/26]
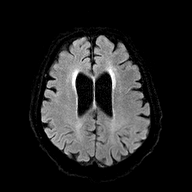
[im 26/26]
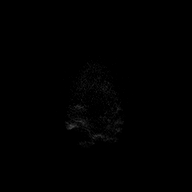

[Series 10: T1 · axial · 5.0mm · 0.90mm/px · z∈[-98,+54]mm · 4 of 27 slices shown (2 of 2)]
[im 1/27]
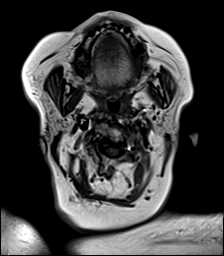
[im 9/27]
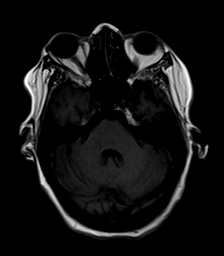
[im 18/27]
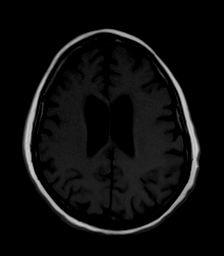
[im 27/27]
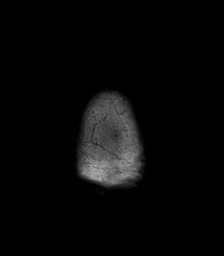

[Series 11: T2 · coronal · 5.0mm · 0.45mm/px · 4 of 31 slices shown (2 of 2)]
[im 1/31]
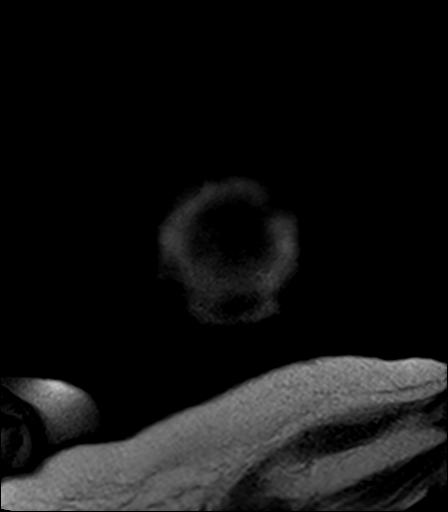
[im 11/31]
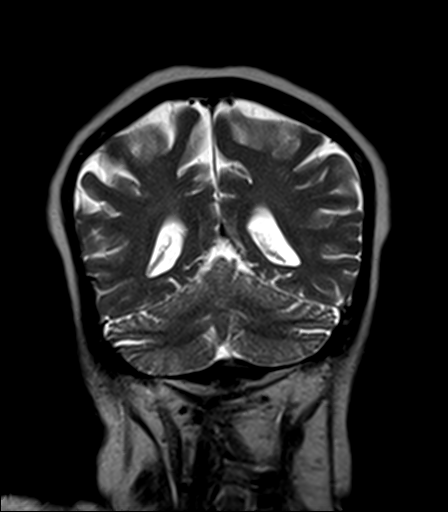
[im 21/31]
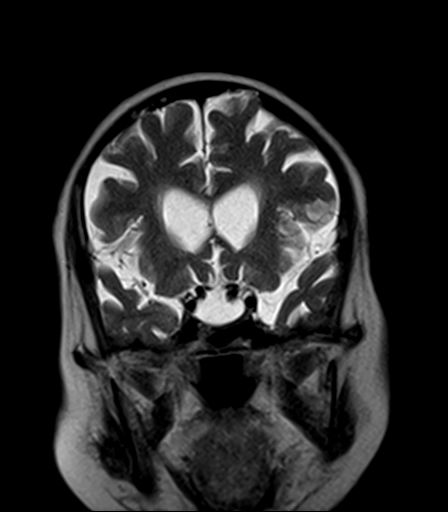
[im 31/31]
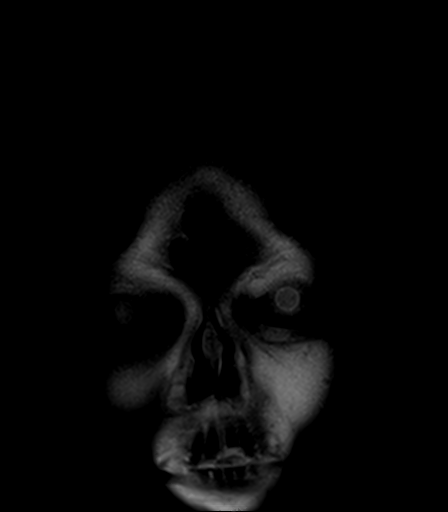

[48 of 48 positions shown; findings below may reference images not displayed]

FINDINGS: Brain: Generalized atrophy. Negative for hydrocephalus. Negative for
acute infarct. Negative for hemorrhage. Empty sella, unchanged from
prior studies.

Pineal region mass measuring 13 x 17 mm. This has heterogeneous
signal intensity on T1 and T2 and increased signal on
diffusion-weighted imaging. Mild mass-effect on the tectum. This was
present on the CT of [DATE] with possible mild interval
growth.

Vascular: Negative for hyperdense vessel

Skull and upper cervical spine: No focal skeletal lesion.

Sinuses/Orbits: Paranasal sinuses clear.  Negative orbit

Other: None
IMPRESSION: Negative for acute infarct

Pineal mass measuring 13 x 17 mm. Possible neoplasm. Recommend
follow-up MRI brain with contrast. Possible mild growth since [DATE].

## 2020-12-05 MED ORDER — DEXTROSE-NACL 5-0.9 % IV SOLN: INTRAVENOUS | Status: DC

## 2020-12-05 MED ORDER — LACTATED RINGERS IV BOLUS
250.0000 mL | Freq: Once | INTRAVENOUS | Status: AC
  Administered 2020-12-05: 250 mL via INTRAVENOUS

## 2020-12-05 MED ORDER — LACTATED RINGERS IV SOLN: INTRAVENOUS | Status: DC

## 2020-12-05 NOTE — Progress Notes (Signed)
MD Allena Katz made aware that pt has not voided on day shift. Per MD, monitor for now and I&O cath patient if bladder scan shows greater than 300 cc BID PRN.

## 2020-12-05 NOTE — Progress Notes (Signed)
Patient ID: Chelsea Glass, female   DOB: June 29, 1956, 65 y.o.   MRN: [id number]  MRI of the brain for facial drool noted. Pt not much conversive today as well.  D/w IR to hold off PEG placement today D/w dter on the phone

## 2020-12-05 NOTE — Plan of Care (Signed)
PMT note:   Patient is resting in bed. Patient smiles when being spoken to, but does not attempt to speak, and is unable to participate in GOC conversation. No family at bedside. Will reach out to daughter.   No charge.

## 2020-12-05 NOTE — Progress Notes (Signed)
Pt did not void since admit. 2/26. Bladder scan done at 00:45 with 386 in the bladder. Hospitalist notified, In and out cath ordered and completed with 400 mls yellow urine with red sediment. Bolus and IV infusion changed. Bladder scans and In and Out cath protocols placed.

## 2020-12-05 NOTE — Progress Notes (Signed)
IR received requests for gastrostomy tube and PICC placements. The patient had been approved for both procedures but IR then received a phone call from Dr. Allena Katz stating the team was going to try NG tube first and patient has two working peripheral IVs. Dr. Allena Katz asked IR to hold off for now on both procedures and will re-consult IR if needed.  Alwyn Ren, [place] [phone] 12/05/2020, 1:50 PM

## 2020-12-05 NOTE — Progress Notes (Signed)
Physical Therapy Treatment Patient Details Name: Chelsea Glass MRN: [id number] DOB: 11-24-1955 Today's Date: 12/05/2020    History of Present Illness Pt is a 65 y/o F admitted from home on 12/03/20 with c/c of AMS, excessive sleepiness, nausea & dry heaves. Pt currently being treated for aspiration bibasal PNA & gallbladder sludge with associated nausea & dry heaves. PMH: OA, DM2, HTN, Covid 19 in September 2021    PT Comments    Supine PROM BLE's.  Pt with no attempt to assist with ex's.  Held further mobility given assistance level required.  Awaiting MRI.   Follow Up Recommendations  SNF;Other (comment)     Equipment Recommendations       Recommendations for Other Services       Precautions / Restrictions Precautions Precautions: Fall Restrictions Weight Bearing Restrictions: No    Mobility  Bed Mobility                    Transfers                    Ambulation/Gait                 Stairs             Wheelchair Mobility    Modified Rankin (Stroke Patients Only)       Balance                                            Cognition Arousal/Alertness: Lethargic Behavior During Therapy: WFL for tasks assessed/performed Overall Cognitive Status: No family/caregiver present to determine baseline cognitive functioning                                        Exercises Other Exercises Other Exercises: BLE PROM    General Comments        Pertinent Vitals/Pain Pain Assessment: Faces Faces Pain Scale: Hurts little more Pain Location: Grimmacing with EX Pain Intervention(s): Limited activity within patient's tolerance;Monitored during session;Repositioned    Home Living                      Prior Function            PT Goals (current goals can now be found in the care plan section) Progress towards PT goals: Not progressing toward goals - comment    Frequency    Min  2X/week      PT Plan Current plan remains appropriate    Co-evaluation              AM-PAC PT "6 Clicks" Mobility   Outcome Measure  Help needed turning from your back to your side while in a flat bed without using bedrails?: Total Help needed moving from lying on your back to sitting on the side of a flat bed without using bedrails?: Total Help needed moving to and from a bed to a chair (including a wheelchair)?: Total Help needed standing up from a chair using your arms (e.g., wheelchair or bedside chair)?: Total Help needed to walk in hospital room?: Total Help needed climbing 3-5 steps with a railing? : Total 6 Click Score: 6    End of Session   Activity Tolerance: Patient tolerated treatment well Patient left: in  bed;with bed alarm set;with call bell/phone within reach   PT Visit Diagnosis: Muscle weakness (generalized) (M62.81);Difficulty in walking, not elsewhere classified (R26.2)     Time: 9480-1655 PT Time Calculation (min) (ACUTE ONLY): 10 min  Charges:  $Therapeutic Exercise: 8-22 mins                    Danielle Dess, PTA 12/05/20, 3:29 PM

## 2020-12-05 NOTE — Progress Notes (Addendum)
Initial Nutrition Assessment  DOCUMENTATION CODES:   Obesity unspecified  INTERVENTION:   If G-tube placed recommend:  Osmolite 1.6- 6 cans daily- Initiate with 1/2 can 6 times daily and advance as tolerated. Flush with 66m of water before and after each feed.   Pro-Source 465mBID via tube, provides 40kcal and 11g of protein per serving   Regimen provides 2210kcal/day, 112g/day protein and 168647may free water   If nasogastric tube placed, recommend:  Osmolite 1.5 _0 /hr- Initiate at 44m4m and increase by 10ml52mq 12 hours until goal rate is reached.   Free water flushes 100ml 32mours   Pro-Source 45ml B45mia tube, provides 40kcal and 11g of protein per serving   Regimen provides 2240kcal/day 112g/day protein and 1697ml/da17mee water   Pt at high refeed risk; recommend monitor potassium, magnesium and phosphorus labs daily until stable  Recommend vitamin C 250mg BID37m tube  NUTRITION DIAGNOSIS:   Inadequate oral intake related to lethargy/confusion as evidenced by NPO status.  GOAL:   Patient will meet greater than or equal to 90% of their needs  MONITOR:   Diet advancement,Labs,Weight trends,TF tolerance,Skin,I & O's  REASON FOR ASSESSMENT:   Consult Assessment of nutrition requirement/status  ASSESSMENT:   64 y.o. A71[place]-American female with medical history significant for osteoarthritis, type 2 diabetes mellitus, hypertension and COVID-19 in September 2021 who is admitted with failure to thrive  Met with patient and patient's daughter in room today. Pt is lethargic and unable to provide any meaningful nutrition related history; history provided by daughter at bedside. Daughter reports pt with a steady decline in her health since having COVID in September. Daughter reports that patient has had poor appetite and oral intake since September but that over the past 4 days, pt has had a decline in her mentation. Daughter reports that last week, pt was  talking to her and complaining of knee pain a lot but over the past 4 days, she has been lethargic and sleeping a lot. Pt does not make eye contact with RD but only shakes her head yes to every question. Of note, pt has been on remeron for appetite stimulant with no improvement in oral intake. Daughter reports that pt has lost 100lbs since having COVID. Per chart, pt has lost 98lbs(32%) over the past year; this is significant weight loss. Pt weighed 308lbs in March 2021 and 272lbs in September 2021. It appears that patient's weight loss started prior to having COVID. Pt lost 36lbs(12%) from March to September. Pt is currently NPO; pt has been too lethargic to be evaluated by SLP. Daughter is requesting G-tube placement; IR to evaluate pt. If its going to be more than 3-4 days before G-tube can be placed, would recommend nasogastric tube and feeds. Plan is for MRI of head today secondary to pt's decreased mentation. Pt is at high refeed risk. Pt is at high risk for malnutrition.   Medications reviewed and include: lovenox, insulin, remeron, KCl, NaCl w/ 5% dextrose _1 /hr  Labs reviewed: K 3.1(L), BUN 29(H), creat 1.46(H)- 2/26 P 2.8 wnl, Mg 1.7 wnl Wbc-12.2(H)  NUTRITION - FOCUSED PHYSICAL EXAM:  Flowsheet Row Most Recent Value  Orbital Region No depletion  Upper Arm Region No depletion  Thoracic and Lumbar Region No depletion  Buccal Region No depletion  Temple Region No depletion  Clavicle Bone Region Mild depletion  Clavicle and Acromion Bone Region Mild depletion  Scapular Bone Region No depletion  Dorsal Hand No depletion  Patellar Region Moderate depletion  Anterior Thigh Region Moderate depletion  Posterior Calf Region Severe depletion  Edema (RD Assessment) Mild  Hair Reviewed  Eyes Reviewed  Mouth Reviewed  Skin Reviewed  Nails Reviewed     Diet Order:   Diet Order            Diet NPO time specified  Diet effective now                EDUCATION NEEDS:   No  education needs have been identified at this time  Skin:  Skin Assessment: Reviewed RN Assessment (ecchymosis)  Last BM:  2/25  Height:   Ht Readings from Last 1 Encounters:  12/03/20 _0  (1.651 m)    Weight:   Wt Readings from Last 1 Encounters:  12/03/20 95.8 kg    Ideal Body Weight:  56.8 kg  BMI:  Body mass index is 35.16 kg/m.  Estimated Nutritional Needs:   Kcal:  2000-2300kcal/day  Protein:  100-115g/day  Fluid:  1.7L-2.0L/day  Koleen Distance MS, RD, LDN Please refer to [facility] for RD and/or RD on-call/weekend/after hours pager

## 2020-12-05 NOTE — Progress Notes (Signed)
Chart reviewed. Pt NPO. Has refused to eat, little interest in food since COVID Sept 2021. Family interested in feeing tube which may happen today per Nsg. Nsg tried applesauce this am and Pt pursed her lips tightly, refusing. Will hold off on assessment at this time as Pt is NPO for procedure. Agree with need for alternative nutrition. May need swallow eval after feeding tube is placed to determine if Pt can have Po's as well.

## 2020-12-05 NOTE — Progress Notes (Signed)
[facility]  - [place] at [facility]   PATIENT NAME: Chelsea Glass    MR#:  [id number]  DATE OF BIRTH:  10-02-1956  SUBJECTIVE:  patient appears to be a poor historian. She answers yes no. Unable to hold meaningful conversation. Keeps her eyes closed throughout when I was in the room. Patient did not participate with speech therapist today. Continues to keep her eyes closed and laying in bed.  Unable to get PICC line placed yesterday.  REVIEW OF SYSTEMS:   Review of Systems  Unable to perform ROS: Mental status change   Tolerating Diet:no Tolerating PT: SNF  DRUG ALLERGIES:   Allergies  Allergen Reactions  . Lisinopril Swelling    VITALS:  Blood pressure 137/74, pulse 80, temperature 97.9 F (36.6 C), resp. rate 16, height 5\' 5"  (1.651 m), weight 95.8 kg, SpO2 98 %.  PHYSICAL EXAMINATION:   Physical Exam limited GENERAL:  65 y.o.-year-old patient lying in the bed with no acute distress.  LUNGS: Normal breath sounds bilaterally, no wheezing, rales, rhonchi. No use of accessory muscles of respiration.  CARDIOVASCULAR: S1, S2 normal. No murmurs, rubs, or gallops.  ABDOMEN: Soft, nontender, nondistended. Bowel sounds present.EXTREMITIES: No cyanosis, clubbing or edema b/l.    NEUROLOGIC: limited exam. Patient appears grossly nonfocal neuro- exam  PSYCHIATRIC:  patient is alert however keep size close. Mood appears depressed and affect flat  SKIN: No obvious rash, lesion, or ulcer.   per RN  LABORATORY PANEL:  CBC Recent Labs  Lab 12/05/20 0432  WBC 12.2*  HGB 13.1  HCT 37.9  PLT 235    Chemistries  Recent Labs  Lab 12/03/20 1301 12/03/20 1437 12/05/20 0702  NA 136  --   --   K 3.1*  --   --   CL 102  --   --   CO2 18*  --   --   GLUCOSE 124*  --   --   BUN 29*  --   --   CREATININE 1.46*  --   --   CALCIUM 9.7  --   --   MG  --    < > 1.7  AST 37  --   --   ALT 37  --   --   ALKPHOS 366*  --   --   BILITOT 2.2*  --   --    <  > = values in this interval not displayed.   Cardiac Enzymes No results for input(s): TROPONINI in the last 168 hours. RADIOLOGY:  CT ABDOMEN PELVIS WO CONTRAST  Result Date: 12/03/2020 CLINICAL DATA:  Nausea vomiting. EXAM: CT ABDOMEN AND PELVIS WITHOUT CONTRAST TECHNIQUE: Multidetector CT imaging of the abdomen and pelvis was performed following the standard protocol without IV contrast. COMPARISON:  None. FINDINGS: Lower chest: Streaky and patchy airspace opacities with peripheral predominance in the bilateral lung bases. Small left pleural effusion. Hepatobiliary: Normal appearance of the liver. High density of the contents of the gallbladder. Pancreas: Unremarkable. No pancreatic ductal dilatation or surrounding inflammatory changes. Spleen: Normal in size without focal abnormality. Adrenals/Urinary Tract: Normal adrenal glands. Renal cysts. No evidence of obstructive uropathy or nephrolithiasis. Calcific density within the dependent portion of the right hand side of the urinary bladder may represent a collection of minute calculi/crystals. Stomach/Bowel: Stomach is within normal limits. No evidence of appendicitis. No evidence of bowel wall thickening, distention, or inflammatory changes. Vascular/Lymphatic: Aortic atherosclerosis. No enlarged abdominal or pelvic lymph nodes. Reproductive: Leiomyomatous uterus.  No adnexal  masses seen. Other: Fat containing periumbilical anterior abdominal wall hernia. Musculoskeletal: S shaped scoliosis and spondylosis of the lumbosacral spine. IMPRESSION: 1. No evidence of obstructive uropathy or nephrolithiasis. 2. Calcific density within the dependent portion of the right hand side of the urinary bladder may represent a collection of minute calculi/crystals. 3. Streaky and patchy airspace opacities with peripheral predominance in the bilateral lung bases. This may represent multifocal pneumonia or aspiration pneumonitis. Small left pleural effusion. 4. High density  of the contents of the gallbladder, likely representing sludge. Please correlate to right upper quadrant ultrasound. 5. Leiomyomatous uterus. 6. Fat containing periumbilical anterior abdominal wall hernia. 7. Aortic atherosclerosis. Aortic Atherosclerosis (ICD10-I70.0). Electronically Signed   By: Ted Mcalpine M.D.   On: 12/03/2020 18:41   CT Head Wo Contrast  Result Date: 12/03/2020 CLINICAL DATA:  Neural deficit. EXAM: CT HEAD WITHOUT CONTRAST TECHNIQUE: Contiguous axial images were obtained from the base of the skull through the vertex without intravenous contrast. COMPARISON:  June 17, 2020 FINDINGS: Brain: No evidence of acute infarction, hemorrhage, hydrocephalus, extra-axial collection or mass lesion/mass effect. Mild brain parenchymal volume loss and deep white matter microangiopathy. Vascular: Calcific atherosclerotic disease of the intra cavernous carotid arteries. Skull: Normal. Negative for fracture or focal lesion. Sinuses/Orbits: No acute finding. Other: None. IMPRESSION: 1. No acute intracranial abnormality. 2. Mild brain parenchymal atrophy and chronic microvascular disease. Electronically Signed   By: Ted Mcalpine M.D.   On: 12/03/2020 16:15   [place] EKG SITE RITE  Result Date: 12/04/2020 If [facility] image not attached, placement could not be confirmed due to current cardiac rhythm.  US Abdomen Limited RUQ (LIVER/GB)  Result Date: 12/03/2020 CLINICAL DATA:  Elevated liver function tests. EXAM: ULTRASOUND ABDOMEN LIMITED RIGHT UPPER QUADRANT COMPARISON:  None. FINDINGS: Gallbladder: Distended gallbladder is noted without gallbladder wall thickening or pericholecystic fluid. No sonographic Murphy's sign is noted. No definite cholelithiasis is noted. Common bile duct: Not visualized.  Exam is limited due to body habitus. Liver: No focal lesion identified. Within normal limits in parenchymal echogenicity. Portal vein is patent on color Doppler imaging with normal direction of  blood flow towards the liver. Other: None. IMPRESSION: Gallbladder distention is noted without definite evidence of cholelithiasis or cholecystitis. Common bile duct is not visualized due to body habitus. Electronically Signed   By: Lupita Raider M.D.   On: 12/03/2020 23:19   ASSESSMENT AND PLAN:  Chelsea Glass is a 65 y.o. African-American female with medical history significant for osteoarthritis, type 2 diabetes mellitus and hypertension, as well as COVID-19 in September 2021, who presented to the emergency room with acute onset of altered mental status and not acting herself for the last 4 days with excessive sleepiness. She has been having nausea and dry heaves without abdominal pain.  Her daughter stated that she has not eaten much and has been bed bound since September 2021 since her COVID-19 infection.    Adult failure to thrive/poor PO intake/generalized deconditioning Hypokalemia/depressed mood -- patient came in with ongoing weight loss and poor PO intake per daughter. Since COVID infection in September 2021 per daughter patient has been going downhill. -- She was admitted at [facility] after discharge from [facility] with adult failure to thrive and poor PO intake. Patient also went to rehab in [place]. -- She recently was seen in [facility] emergency room on 10th of February she was discharged to go home with home health and daughter thinking patient will hopefully do better with her mood and  eating. -- Daughter says patient has lost more than hundred pounds since September 2021. She has not ambulated and no interest in any acitivity. Stays in bed most of the time --she is requesting if patient can get a feeding tube -- dietitian consult -- speech therapy-- patient showed very little interest in participating with speech therapy --spoke with IR RN regarding PICC line and G-tube/peg tube placement  --discussed with daughter risk and complications of feeding tube, she voiced  understanding patient's daughter will sign the consent  Abnormal ultrasound abdomen with gallbladder distention similar to previous imaging studies -- clinically patient does not appear to be septic or having any infection -- discontinue IV antibiotics-- no source of infection noted so far, chest x-ray negative for pneumonia. Appears to have some opacities from previous COVID infection.sats are more than 92% on room air -- patient was seen by surgery Dr. Lady Gary-- agree no surgical indication at present for gallbladder. Labs are stable.  Depressed mood -- continue Cymbalta , remeron --  psych consult with Dr. Toni Amend.  -- patient has appointment for geriatric evaluation at [facility] per daughter in April 2022  Type II diabetes with sugars controlled -- sliding scale insulin -- hold PO diabetes meds  History of tachycardia/SVT -- patient follows with cardiology at [facility] -- recently started on Toprol-XL hundred milligrams daily. With soft blood pressure decreased dose to 25 mg daily  Generalized deconditioning -- PT evaluation noted--rec SNF  Palliative care to have goals of discussion with patient and family  Procedures: Family communication : Dolores daughter on the phone.  Consults : surgery CODE STATUS: full code per daughter [name] DVT Prophylaxis : Lovenox Level of care: Med-Surg Status is: Inpatient  Remains inpatient appropriate because:IV treatments appropriate due to intensity of illness or inability to take PO and Inpatient level of care appropriate due to severity of illness   Dispo: The patient is from: Home              Anticipated d/c is to: TBD              Patient currently is not medically stable to d/c.   Difficult to place patient No  IR to evaluate for PICC line and G-tube placement. Palliative care consultation placed.      TOTAL TIME TAKING CARE OF THIS PATIENT: 25 minutes.  >50% time spent on counselling and coordination of care  Note: This dictation was  prepared with Dragon dictation along with smaller phrase technology. Any transcriptional errors that result from this process are unintentional.  Enedina Finner M.D    [facility]   CC: Primary care physician; [facility], [facility] ServicesPatient ID: Chelsea Glass, female   DOB: December 14, 1955, 65 y.o.   MRN: [id number]

## 2020-12-05 NOTE — TOC Initial Note (Addendum)
Transition of Care [facility]) - Initial/Assessment Note    Patient Details  Name: Chelsea Glass MRN: [id number] Date of Birth: 09/05/1956  Transition of Care [facility]) CM/SW Contact:    Chapman Fitch, RN Phone Number: 12/05/2020, 1:33 PM  Clinical Narrative:                 Patient admitted from home with AKI Assessment completed with daughter Deloris Patient lives at home with daughter.  Daughter works from home and is the primary caregiver for patient   PCP [employer].  Daughter states they use non emergent EMS transport to get patient to appointments  Per daughter patient open with [facility] home health  I have reached out to [place] with [facility] to determine what services patient is active with   PT has recommended SNF, however patient is not participating with therapy.  Per daughter she is not interested in placement at SNF or LTC wants patient to return home with resumption of home health services.   Patient has hospital bed, hoyer lift, WC, and BSC in the home    Update:  Per Elnita Maxwell with [facility] patient open with RN and PT.  Daughter requesting aide be added Plan to place Peg tube this admission   Expected Discharge Plan: Home w Home Health Services Barriers to Discharge: Continued Medical Work up   Patient Goals and CMS Choice        Expected Discharge Plan and Services Expected Discharge Plan: Home w Home Health Services   Discharge Planning Services: CM Consult   Living arrangements for the past 2 months: Single Family Home                           HH Arranged: PT [facility] Agency: [employer] [facility] Date [facility] Agency Contacted: 12/05/20   Representative spoke with at [facility] Agency: Elnita Maxwell  Prior Living Arrangements/Services Living arrangements for the past 2 months: Single Family Home Lives with:: Adult Children Patient language and need for interpreter reviewed:: Yes Do you feel safe going back to the place where you live?: Yes       Need for Family Participation in Patient Care: Yes (Comment) Care giver support system in place?: Yes (comment) Current home services: DME    Activities of Daily Living Home Assistive Devices/Equipment: Hospital bed,Bedside commode/3-in-1,Hoyer Lift,Wheelchair ADL Screening (condition at time of admission) Patient's cognitive ability adequate to safely complete daily activities?: Yes Is the patient deaf or have difficulty hearing?: No Does the patient have difficulty seeing, even when wearing glasses/contacts?: No Does the patient have difficulty concentrating, remembering, or making decisions?: No Patient able to express need for assistance with ADLs?: Yes Does the patient have difficulty dressing or bathing?: Yes Independently performs ADLs?: No Communication: Dependent Dressing (OT): Dependent Is this a change from baseline?: Pre-admission baseline Grooming: Dependent Is this a change from baseline?: Pre-admission baseline Feeding: Needs assistance Is this a change from baseline?: Pre-admission baseline Bathing: Dependent Is this a change from baseline?: Pre-admission baseline Toileting: Dependent Is this a change from baseline?: Pre-admission baseline In/Out Bed: Dependent Is this a change from baseline?: Pre-admission baseline Walks in Home: Dependent Is this a change from baseline?: Pre-admission baseline Does the patient have difficulty walking or climbing stairs?: Yes Weakness of Legs: Both Weakness of Arms/Hands: Left  Permission Sought/Granted                  Emotional Assessment  Admission diagnosis:  AKI (acute kidney injury) (HCC) [N17.9] Altered mental status, unspecified altered mental status type [R41.82] Patient Active Problem List   Diagnosis Date Noted  . Weight loss   . Adult failure to thrive   . Hypokalemia   . Depression   . AKI (acute kidney injury) (HCC) 12/03/2020  . Postmenopausal bleeding   . Hypertension   .  Acute respiratory failure with hypoxia (HCC) 06/13/2020  . Obesity, Class III, BMI 40-49.9 (morbid obesity) (HCC) 06/13/2020  . Severe sepsis (HCC) 06/13/2020  . New onset atrial fibrillation (HCC) 06/13/2020  . Pneumonia due to COVID-19 virus 06/12/2020   PCP:  [employer] Pharmacy:   [facility] [phone] - [place], [place] - [address] [place] [address] Phone: [phone] Fax: [phone]     Social Determinants of Health (SDOH) Interventions    Readmission Risk Interventions No flowsheet data found.

## 2020-12-06 ENCOUNTER — Inpatient Hospital Stay: Payer: Medicare Other

## 2020-12-06 DIAGNOSIS — R400 Somnolence: Secondary | ICD-10-CM

## 2020-12-06 DIAGNOSIS — Z515 Encounter for palliative care: Secondary | ICD-10-CM | POA: Diagnosis not present

## 2020-12-06 DIAGNOSIS — I959 Hypotension, unspecified: Secondary | ICD-10-CM

## 2020-12-06 DIAGNOSIS — R627 Adult failure to thrive: Secondary | ICD-10-CM

## 2020-12-06 DIAGNOSIS — Z7189 Other specified counseling: Secondary | ICD-10-CM

## 2020-12-06 DIAGNOSIS — R000 Tachycardia, unspecified: Secondary | ICD-10-CM

## 2020-12-06 DIAGNOSIS — R634 Abnormal weight loss: Secondary | ICD-10-CM

## 2020-12-06 DIAGNOSIS — R4182 Altered mental status, unspecified: Secondary | ICD-10-CM | POA: Diagnosis present

## 2020-12-06 LAB — CBC
HCT: 30.8 % — ABNORMAL LOW (ref 36.0–46.0)
Hemoglobin: 10.8 g/dL — ABNORMAL LOW (ref 12.0–15.0)
MCH: 28.9 pg (ref 26.0–34.0)
MCHC: 35.1 g/dL (ref 30.0–36.0)
MCV: 82.4 fL (ref 80.0–100.0)
Platelets: 269 10*3/uL (ref 150–400)
RBC: 3.74 MIL/uL — ABNORMAL LOW (ref 3.87–5.11)
RDW: 19.2 % — ABNORMAL HIGH (ref 11.5–15.5)
WBC: 9.4 10*3/uL (ref 4.0–10.5)
nRBC: 0 % (ref 0.0–0.2)

## 2020-12-06 LAB — D-DIMER, QUANTITATIVE: D-Dimer, Quant: 1.48 ug/mL-FEU — ABNORMAL HIGH (ref 0.00–0.50)

## 2020-12-06 LAB — URINALYSIS, COMPLETE (UACMP) WITH MICROSCOPIC
Bilirubin Urine: NEGATIVE
Glucose, UA: NEGATIVE mg/dL
Ketones, ur: 5 mg/dL — AB
Nitrite: NEGATIVE
Protein, ur: 30 mg/dL — AB
RBC / HPF: >50 RBC/hpf — ABNORMAL HIGH (ref 0–5)
Specific Gravity, Urine: 1.019 (ref 1.005–1.030)
pH: 5 (ref 5.0–8.0)

## 2020-12-06 LAB — PHOSPHORUS: Phosphorus: 1.6 mg/dL — ABNORMAL LOW (ref 2.5–4.6)

## 2020-12-06 LAB — MRSA PCR SCREENING: MRSA by PCR: NEGATIVE

## 2020-12-06 LAB — BASIC METABOLIC PANEL
Anion gap: 10 (ref 5–15)
BUN: 23 mg/dL (ref 8–23)
CO2: 19 mmol/L — ABNORMAL LOW (ref 22–32)
Calcium: 8.8 mg/dL — ABNORMAL LOW (ref 8.9–10.3)
Chloride: 114 mmol/L — ABNORMAL HIGH (ref 98–111)
Creatinine, Ser: 1.21 mg/dL — ABNORMAL HIGH (ref 0.44–1.00)
GFR, Estimated: 49 mL/min — ABNORMAL LOW (ref >60)
Glucose, Bld: 94 mg/dL (ref 70–99)
Potassium: 3.1 mmol/L — ABNORMAL LOW (ref 3.5–5.1)
Sodium: 143 mmol/L (ref 135–145)

## 2020-12-06 LAB — HEPATIC FUNCTION PANEL
ALT: 34 U/L (ref 0–44)
AST: 23 U/L (ref 15–41)
Albumin: 2 g/dL — ABNORMAL LOW (ref 3.5–5.0)
Alkaline Phosphatase: 239 U/L — ABNORMAL HIGH (ref 38–126)
Bilirubin, Direct: 0.1 mg/dL (ref 0.0–0.2)
Indirect Bilirubin: 0.9 mg/dL (ref 0.3–0.9)
Total Bilirubin: 1 mg/dL (ref 0.3–1.2)
Total Protein: 4.9 g/dL — ABNORMAL LOW (ref 6.5–8.1)

## 2020-12-06 LAB — PROTIME-INR
INR: 1.3 — ABNORMAL HIGH (ref 0.8–1.2)
Prothrombin Time: 15.8 seconds — ABNORMAL HIGH (ref 11.4–15.2)

## 2020-12-06 LAB — POTASSIUM: Potassium: 3.1 mmol/L — ABNORMAL LOW (ref 3.5–5.1)

## 2020-12-06 LAB — VITAMIN D 25 HYDROXY (VIT D DEFICIENCY, FRACTURES): Vit D, 25-Hydroxy: 37.81 ng/mL (ref 30–100)

## 2020-12-06 LAB — MAGNESIUM: Magnesium: 2.2 mg/dL (ref 1.7–2.4)

## 2020-12-06 LAB — GLUCOSE, CAPILLARY
Glucose-Capillary: 112 mg/dL — ABNORMAL HIGH (ref 70–99)
Glucose-Capillary: 130 mg/dL — ABNORMAL HIGH (ref 70–99)
Glucose-Capillary: 137 mg/dL — ABNORMAL HIGH (ref 70–99)
Glucose-Capillary: 145 mg/dL — ABNORMAL HIGH (ref 70–99)

## 2020-12-06 LAB — APTT: aPTT: 37 seconds — ABNORMAL HIGH (ref 24–36)

## 2020-12-06 LAB — VITAMIN B12: Vitamin B-12: 1905 pg/mL — ABNORMAL HIGH (ref 180–914)

## 2020-12-06 LAB — TROPONIN I (HIGH SENSITIVITY): Troponin I (High Sensitivity): 9 ng/L (ref <18)

## 2020-12-06 LAB — PROCALCITONIN: Procalcitonin: <0.1 ng/mL

## 2020-12-06 IMAGING — MR MR HEAD W/ CM
3 series · 48 of 48 positions shown · IV contrast (9ml Gadavist)
Comparison: Noncontrast MRI [DATE]

CLINICAL DATA: Abnormal MRI

EXAM:
MRI HEAD WITH CONTRAST
TECHNIQUE: Multiplanar, multiecho pulse sequences of the brain and surrounding
structures were obtained with intravenous contrast.
CONTRAST:  9mL GADAVIST GADOBUTROL 1 MMOL/ML IV SOLN

[Series 5: T1 post-contrast · axial · 1.0mm · 0.98mm/px · z∈[-116,+52]mm · 37 of 176 slices shown (1 of 3)]
[im 1/176]
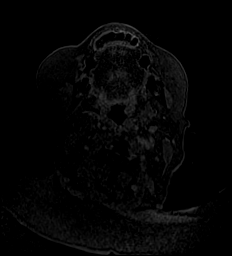
[im 5/176]
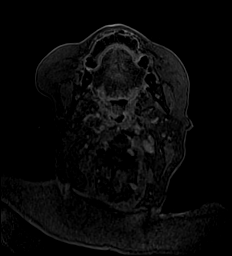
[im 10/176]
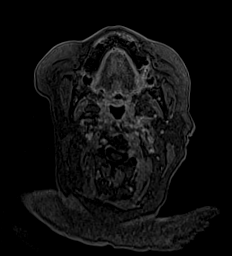
[im 15/176]
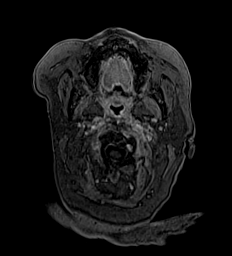
[im 20/176]
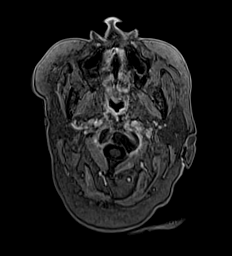
[im 25/176]
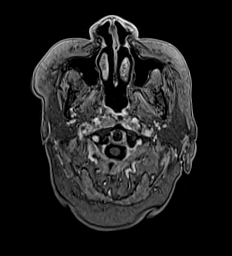
[im 30/176]
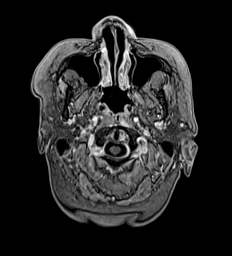
[im 35/176]
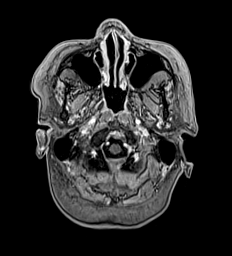
[im 39/176]
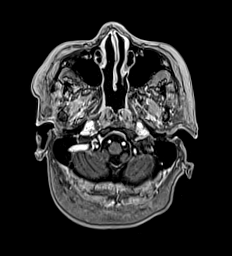
[im 44/176]
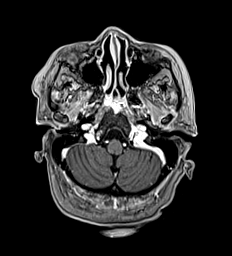
[im 49/176]
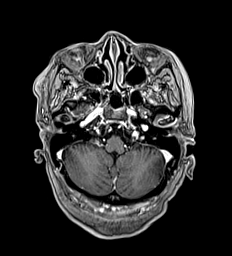
[im 54/176]
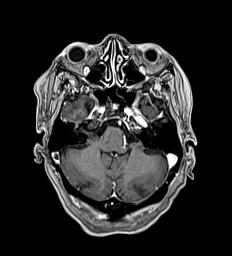
[im 59/176]
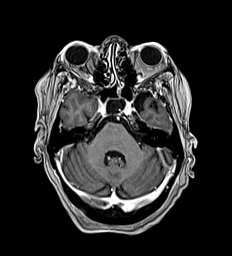
[im 64/176]
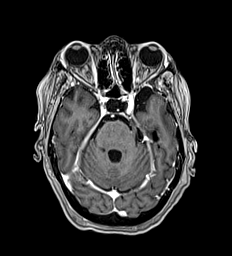
[im 69/176]
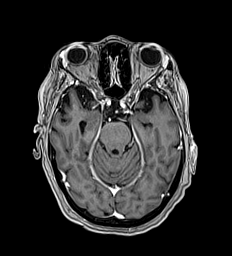
[im 73/176]
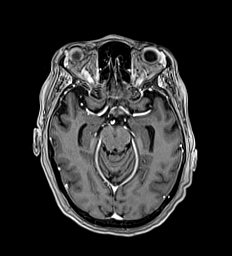
[im 78/176]
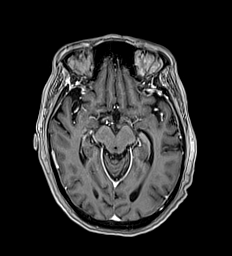
[im 83/176]
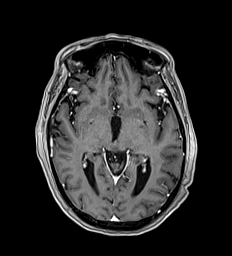
[im 88/176]
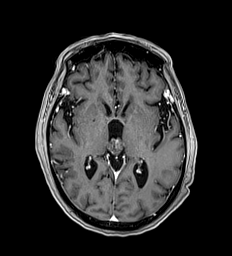
[im 93/176]
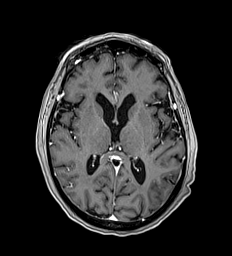
[im 98/176]
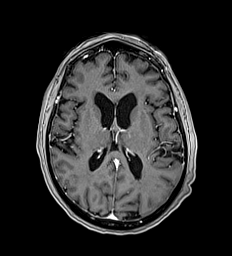
[im 103/176]
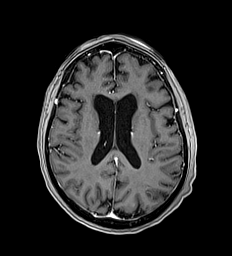
[im 107/176]
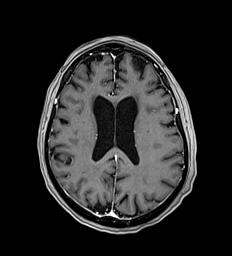
[im 112/176]
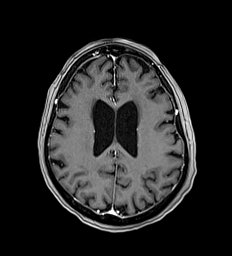
[im 117/176]
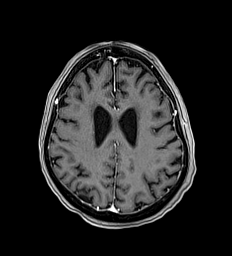
[im 122/176]
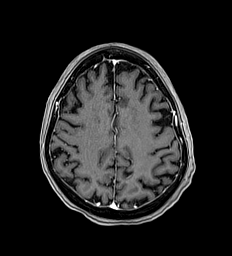
[im 127/176]
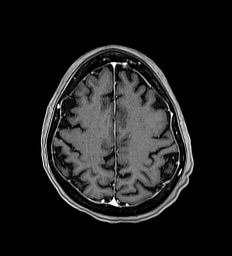
[im 132/176]
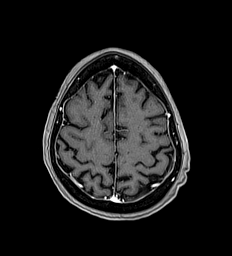
[im 137/176]
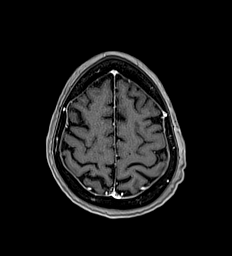
[im 141/176]
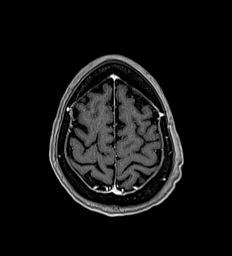
[im 146/176]
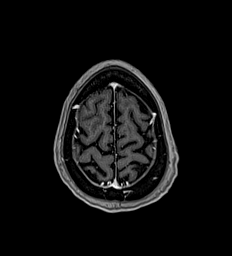
[im 151/176]
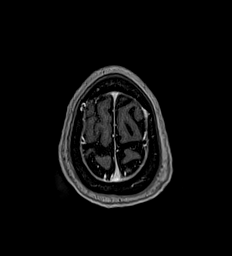
[im 156/176]
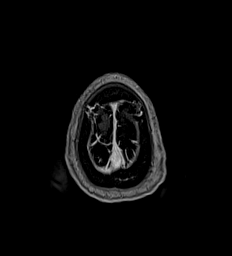
[im 161/176]
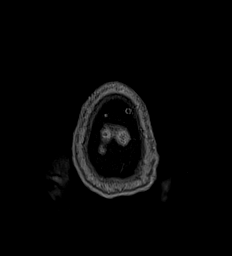
[im 166/176]
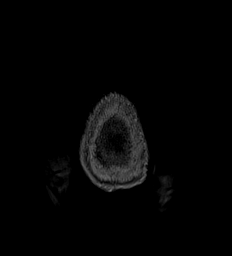
[im 171/176]
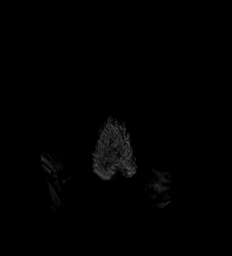
[im 176/176]
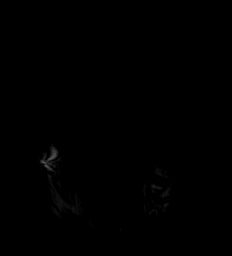

[Series 6: T1 post-contrast · coronal · 5.0mm · 0.57mm/px · 6 of 29 slices shown (2 of 3)]
[im 1/29]
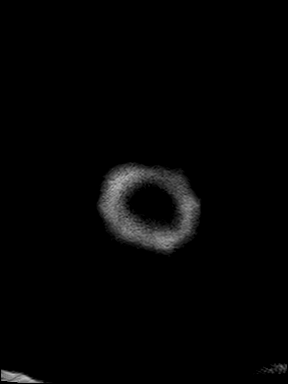
[im 6/29]
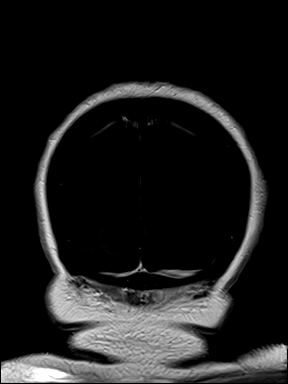
[im 12/29]
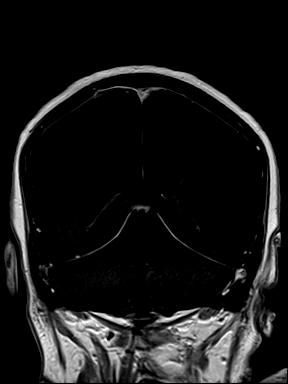
[im 17/29]
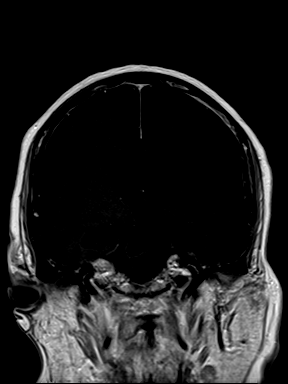
[im 23/29]
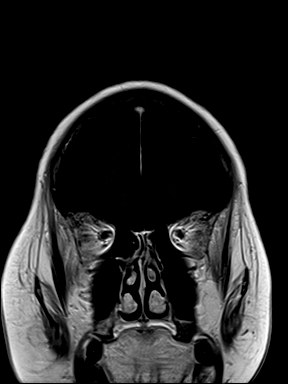
[im 29/29]
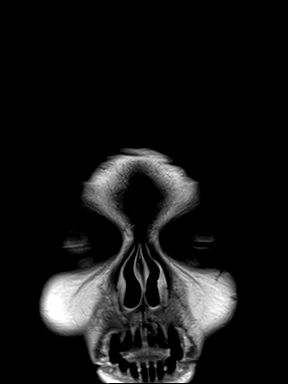

[Series 7: T1 post-contrast · sagittal · 5.0mm · 0.62mm/px · 5 of 23 slices shown (3 of 3)]
[im 1/23]
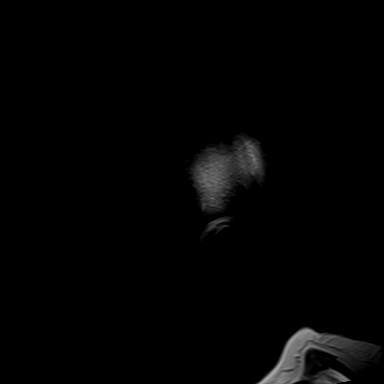
[im 6/23]
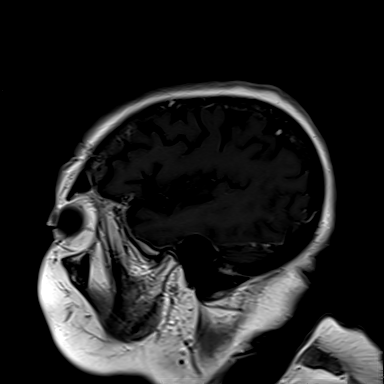
[im 12/23]
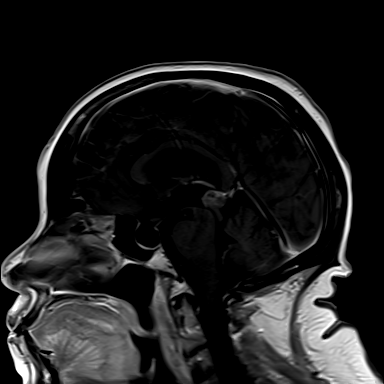
[im 17/23]
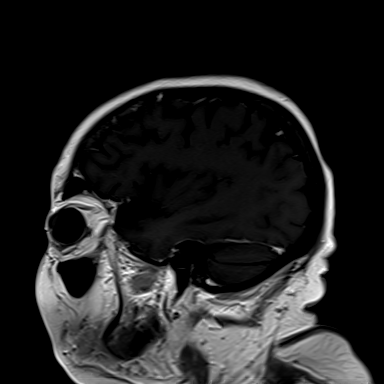
[im 23/23]
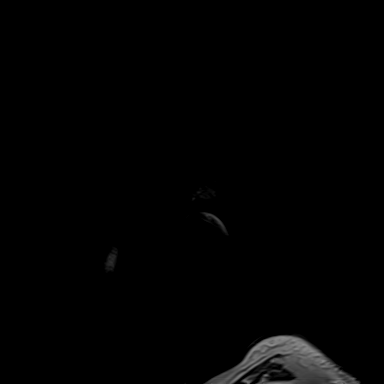

[48 of 48 positions shown; findings below may reference images not displayed]

FINDINGS: Brain: There is heterogeneous enhancement of the previously
identified pineal mass. There is mild mass effect on the tectum.
Chronic enlargement of the third and lateral ventricles could be
related to narrowing of the third ventricle outflow.

Vascular: Unremarkable.

Skull and upper cervical spine: Unremarkable.

Sinuses/Orbits: No new findings.

Other: Expanded, "empty" sella.
IMPRESSION: There is enhancement of previously identified pineal mass. Likely
reflects a parenchymal origin tumor, possibly pineocytoma.

Chronic enlargement of the third and lateral ventricles could be
related to narrowing of the third ventricle outflow by above.

## 2020-12-06 IMAGING — DX DG CHEST 1V PORT
1 series · 1 of 1 positions shown · non-contrast
Comparison: [DATE] at [3U] hours

CLINICAL DATA: Central line adjustment

EXAM:
PORTABLE CHEST 1 VIEW

[chest ap]
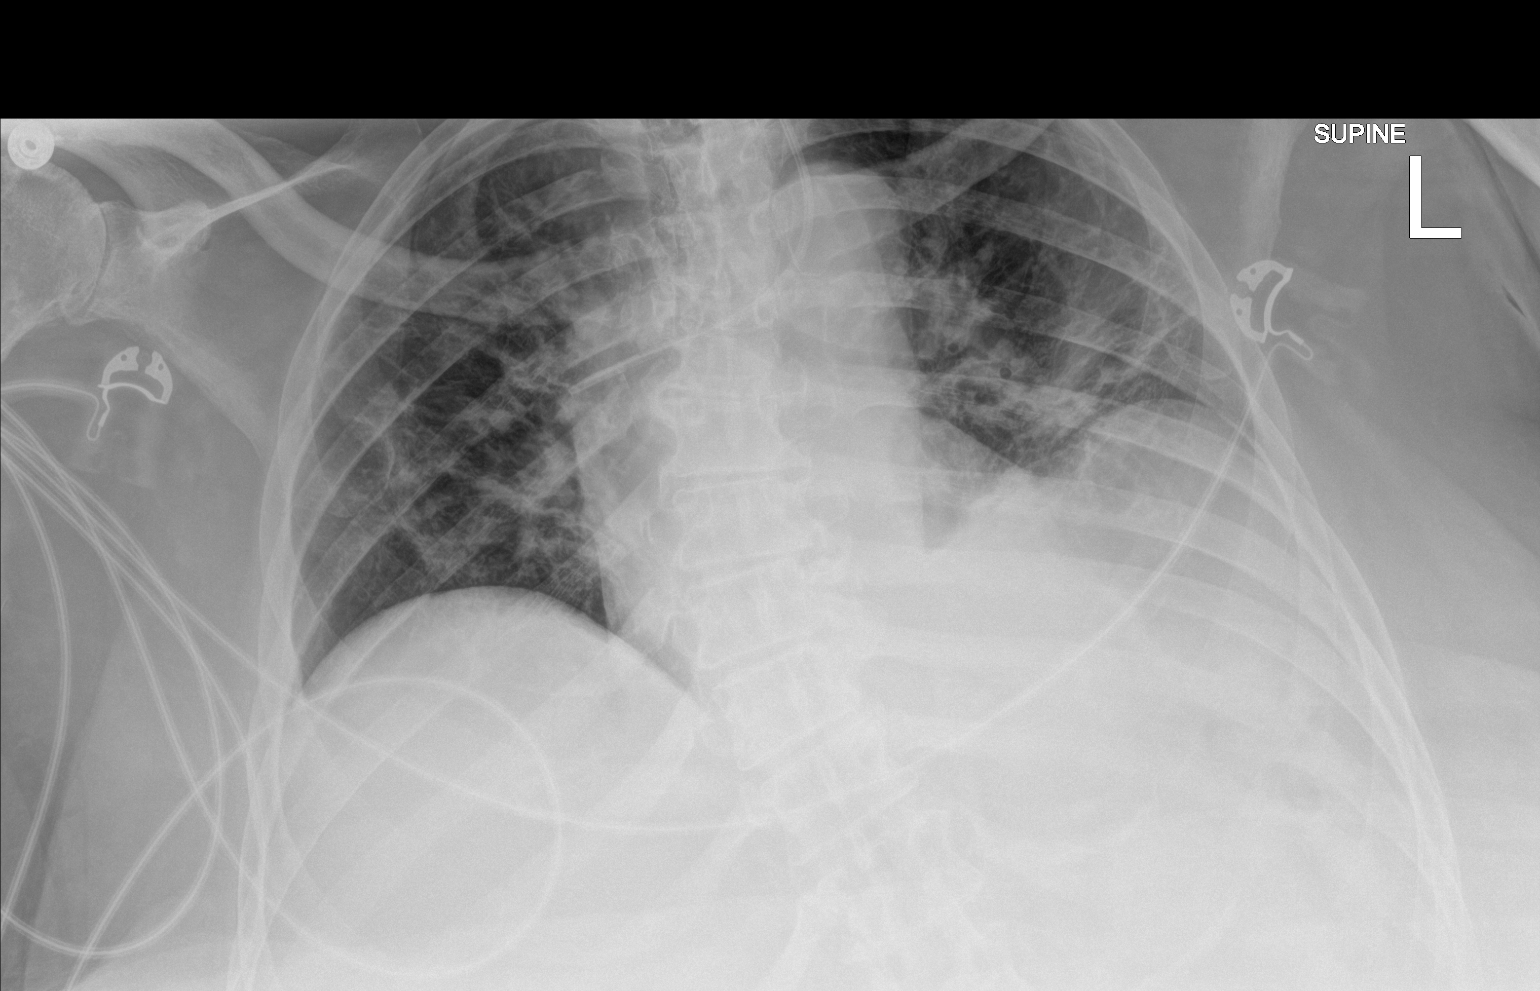

[1 of 1 positions shown; findings below may reference images not displayed]

FINDINGS: Left IJ approach central venous catheter is again seen projecting
over the SVC with tip closely approximating the lateral wall of the
SVC. Catheter appears slightly retracted relative to the previous
exam. Stable heart size. Low lung volumes. Streaky bilateral
airspace opacities, similar to prior. No pneumothorax.
IMPRESSION: 1. Left IJ approach central venous catheter again seen with tip
closely approximating the lateral wall of the SVC. Consider
repositioning.
2. No pneumothorax.

## 2020-12-06 IMAGING — DX DG CHEST 1V PORT
1 series · 1 of 1 positions shown · non-contrast
Comparison: Chest radiograph [DATE]

CLINICAL DATA: Assess central line placement.

EXAM:
PORTABLE CHEST 1 VIEW

[chest ap]
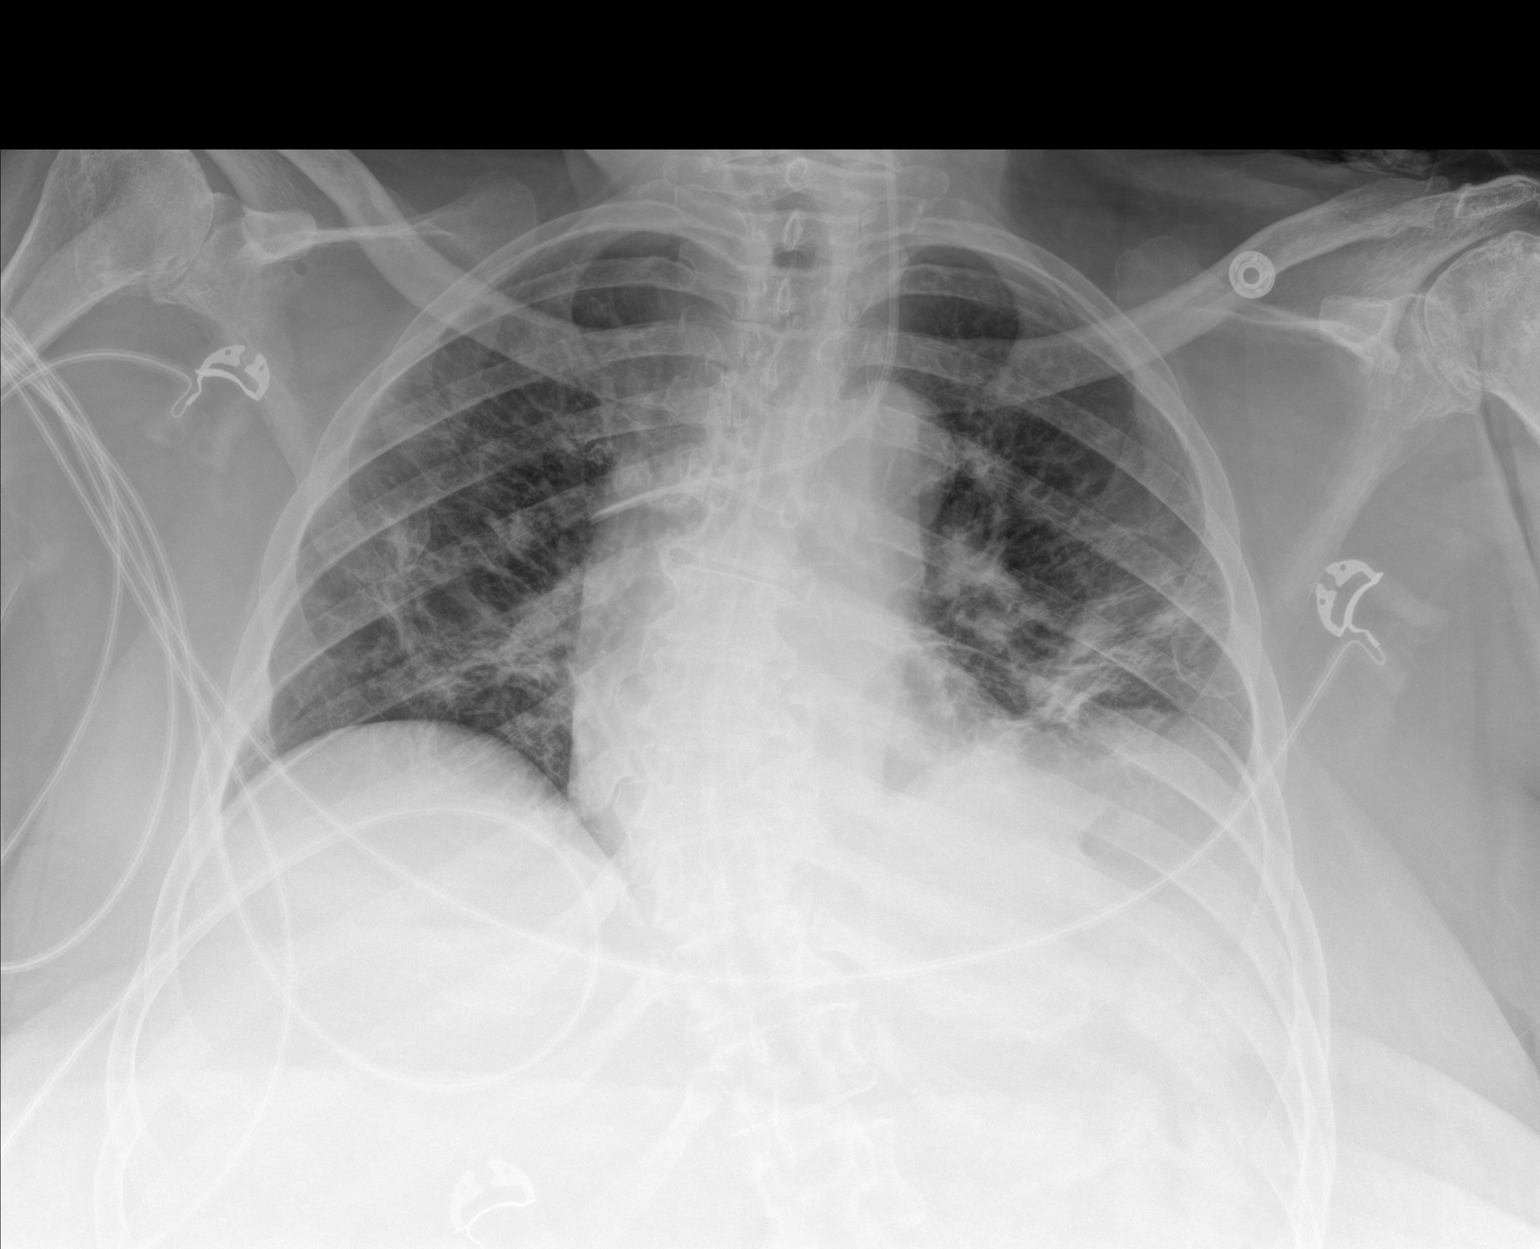

[1 of 1 positions shown; findings below may reference images not displayed]

FINDINGS: Left IJ central venous catheter with tip overlying the SVC, of note
the tip of the catheter is oriented against the sidewall of the SVC
which has been reported to potentially induce vascular injury. The
heart size and mediastinal contours are unchanged. Similar diffuse
airspace opacities. No significant pleural effusion or visible
pneumothorax. The visualized skeletal structures are unchanged.
IMPRESSION: Left IJ central venous catheter with tip overlying the SVC. Of note
the tip of the catheter is oriented against the sidewall of the SVC
which has been reported to potentially induce vascular injury,
repositioning/advancement is suggested. No visible pneumothorax.

## 2020-12-06 IMAGING — DX DG CHEST 1V PORT
1 series · 1 of 1 positions shown · non-contrast
Comparison: [DATE]

CLINICAL DATA: Tachycardia

EXAM:
PORTABLE CHEST 1 VIEW

[chest ap]
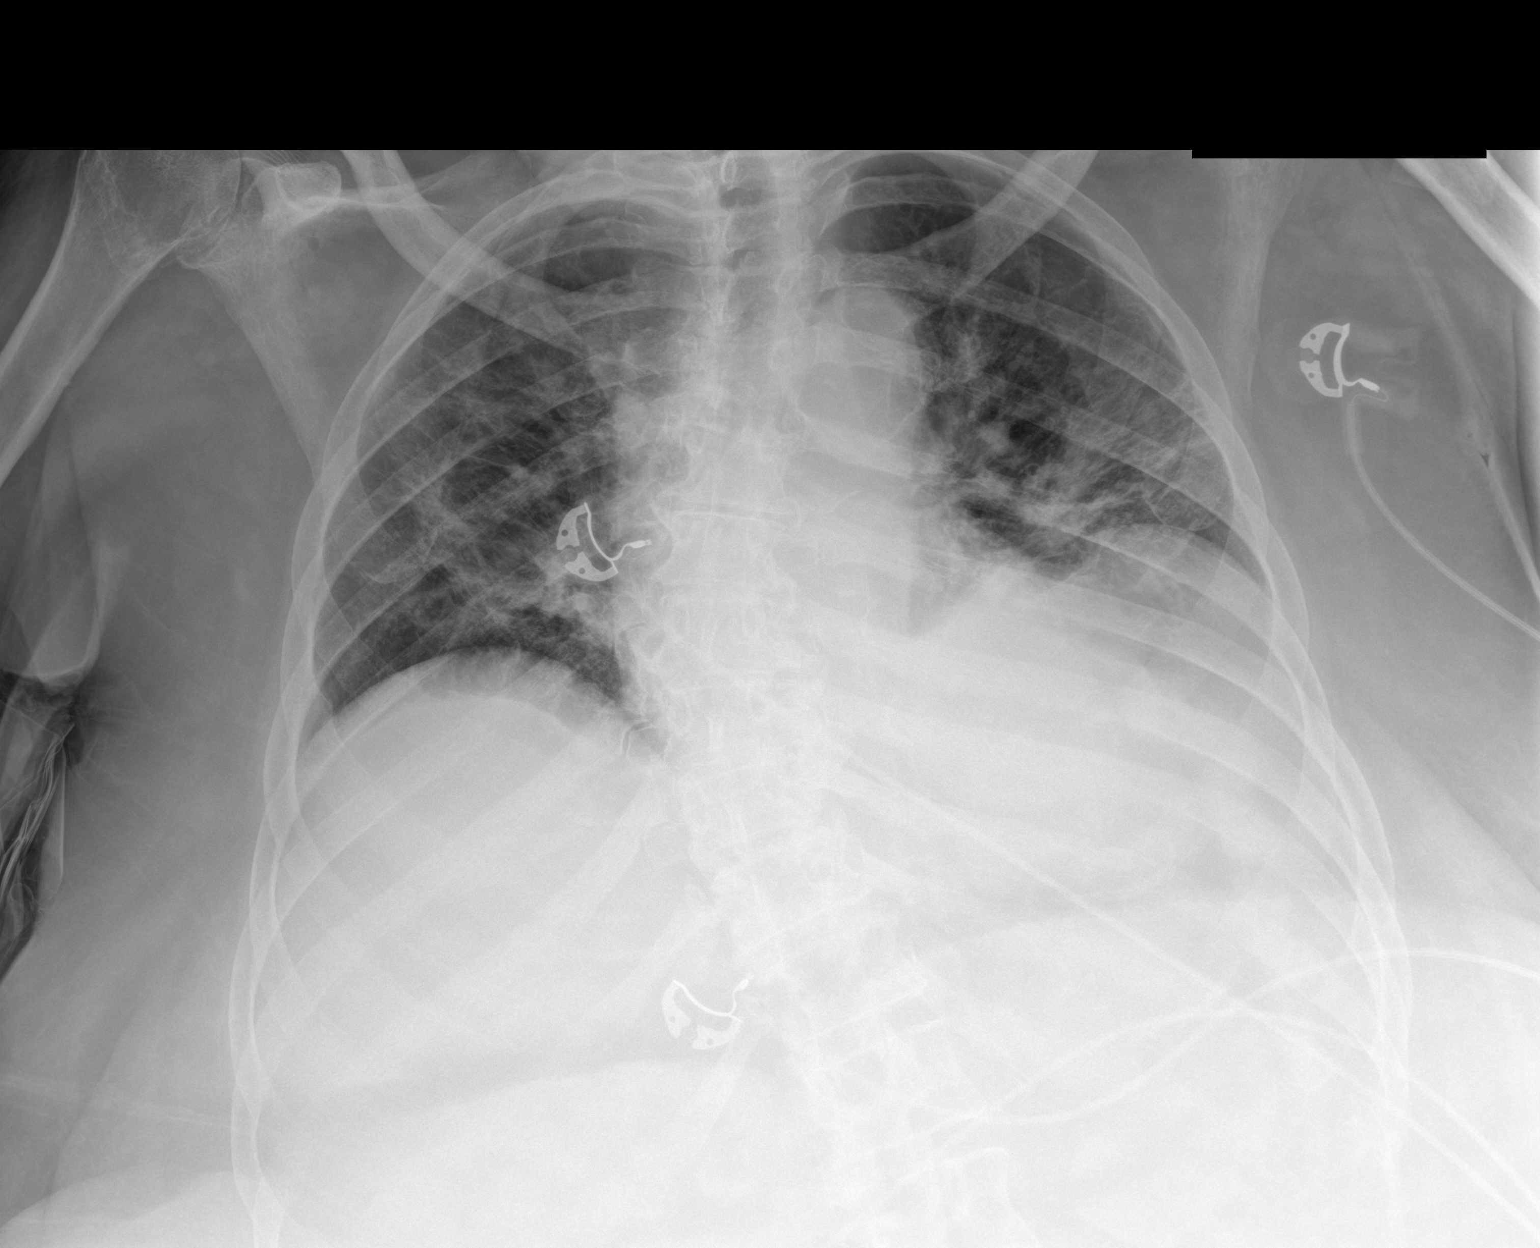

[1 of 1 positions shown; findings below may reference images not displayed]

FINDINGS: Cardiac shadow is stable. Aortic calcifications are again seen.
Diffuse airspace opacities are noted but significantly improved when
compared with the prior exam consistent with residual scarring from
prior [J7] infection. No sizable effusion is noted. No bony
abnormality is seen.
IMPRESSION: Bilateral scarring improved from the prior exam likely related to
[J7] sequelae.

## 2020-12-06 MED ORDER — HEPARIN BOLUS VIA INFUSION
4000.0000 [IU] | Freq: Once | INTRAVENOUS | Status: AC
  Administered 2020-12-06: 4000 [IU] via INTRAVENOUS
  Filled 2020-12-06: qty 4000

## 2020-12-06 MED ORDER — POTASSIUM CHLORIDE 10 MEQ/100ML IV SOLN
10.0000 meq | INTRAVENOUS | Status: AC
  Administered 2020-12-06 (×3): 10 meq via INTRAVENOUS
  Filled 2020-12-06 (×5): qty 100

## 2020-12-06 MED ORDER — VITAL HIGH PROTEIN PO LIQD: 1000.0000 mL | ORAL | Status: DC

## 2020-12-06 MED ORDER — AMIODARONE LOAD VIA INFUSION
150.0000 mg | Freq: Once | INTRAVENOUS | Status: AC
  Administered 2020-12-06: 150 mg via INTRAVENOUS
  Filled 2020-12-06: qty 83.34

## 2020-12-06 MED ORDER — AMIODARONE HCL IN DEXTROSE 360-4.14 MG/200ML-% IV SOLN: 30.0000 mg/h | INTRAVENOUS | Status: DC

## 2020-12-06 MED ORDER — HEPARIN BOLUS VIA INFUSION
4000.0000 [IU] | Freq: Once | INTRAVENOUS | Status: DC
  Filled 2020-12-06: qty 4000

## 2020-12-06 MED ORDER — LACTATED RINGERS IV BOLUS
250.0000 mL | Freq: Once | INTRAVENOUS | Status: AC
  Administered 2020-12-06: 250 mL via INTRAVENOUS

## 2020-12-06 MED ORDER — MAGNESIUM SULFATE 2 GM/50ML IV SOLN
2.0000 g | Freq: Once | INTRAVENOUS | Status: AC
  Administered 2020-12-06: 2 g via INTRAVENOUS
  Filled 2020-12-06: qty 50

## 2020-12-06 MED ORDER — AMIODARONE HCL IN DEXTROSE 360-4.14 MG/200ML-% IV SOLN
30.0000 mg/h | INTRAVENOUS | Status: DC
  Administered 2020-12-07 (×3): 30 mg/h via INTRAVENOUS
  Filled 2020-12-06 (×2): qty 200

## 2020-12-06 MED ORDER — METOPROLOL TARTRATE 5 MG/5ML IV SOLN
5.0000 mg | Freq: Once | INTRAVENOUS | Status: AC
  Administered 2020-12-06: 5 mg via INTRAVENOUS
  Filled 2020-12-06: qty 5

## 2020-12-06 MED ORDER — DIGOXIN 0.25 MG/ML IJ SOLN: 0.2500 mg | Freq: Once | INTRAMUSCULAR | Status: DC

## 2020-12-06 MED ORDER — AMIODARONE IV BOLUS ONLY 150 MG/100ML: 150.0000 mg | Freq: Once | INTRAVENOUS | Status: DC

## 2020-12-06 MED ORDER — HEPARIN (PORCINE) 25000 UT/250ML-% IV SOLN: 1100.0000 [IU]/h | INTRAVENOUS | Status: DC

## 2020-12-06 MED ORDER — GADOBUTROL 1 MMOL/ML IV SOLN
9.0000 mL | Freq: Once | INTRAVENOUS | Status: AC | PRN
  Administered 2020-12-06: 9 mL via INTRAVENOUS

## 2020-12-06 MED ORDER — AMIODARONE LOAD VIA INFUSION
150.0000 mg | Freq: Once | INTRAVENOUS | Status: DC
  Filled 2020-12-06: qty 83.34

## 2020-12-06 MED ORDER — INSULIN ASPART 100 UNIT/ML ~~LOC~~ SOLN
0.0000 [IU] | SUBCUTANEOUS | Status: DC
  Administered 2020-12-06 – 2020-12-07 (×3): 1 [IU] via SUBCUTANEOUS
  Administered 2020-12-07: 2 [IU] via SUBCUTANEOUS
  Administered 2020-12-07 (×3): 1 [IU] via SUBCUTANEOUS
  Administered 2020-12-07 – 2020-12-08 (×2): 2 [IU] via SUBCUTANEOUS
  Administered 2020-12-08: 1 [IU] via SUBCUTANEOUS
  Administered 2020-12-08 – 2020-12-09 (×5): 2 [IU] via SUBCUTANEOUS
  Administered 2020-12-09: 1 [IU] via SUBCUTANEOUS
  Administered 2020-12-09: 2 [IU] via SUBCUTANEOUS
  Administered 2020-12-09: 3 [IU] via SUBCUTANEOUS
  Administered 2020-12-09: 2 [IU] via SUBCUTANEOUS
  Administered 2020-12-10: 1 [IU] via SUBCUTANEOUS
  Administered 2020-12-10 (×2): 2 [IU] via SUBCUTANEOUS
  Administered 2020-12-10: 1 [IU] via SUBCUTANEOUS
  Administered 2020-12-10: 2 [IU] via SUBCUTANEOUS
  Administered 2020-12-10: 1 [IU] via SUBCUTANEOUS
  Administered 2020-12-11: 2 [IU] via SUBCUTANEOUS
  Administered 2020-12-11: 1 [IU] via SUBCUTANEOUS
  Administered 2020-12-11 (×2): 2 [IU] via SUBCUTANEOUS
  Administered 2020-12-13 (×2): 1 [IU] via SUBCUTANEOUS
  Administered 2020-12-13 (×3): 2 [IU] via SUBCUTANEOUS
  Administered 2020-12-14 – 2020-12-16 (×8): 1 [IU] via SUBCUTANEOUS
  Administered 2020-12-16 (×3): 2 [IU] via SUBCUTANEOUS
  Filled 2020-12-06 (×36): qty 1

## 2020-12-06 MED ORDER — OSMOLITE 1.5 CAL PO LIQD: 1000.0000 mL | ORAL | Status: DC

## 2020-12-06 MED ORDER — POTASSIUM CHLORIDE 10 MEQ/100ML IV SOLN
10.0000 meq | INTRAVENOUS | Status: AC
  Administered 2020-12-06 – 2020-12-07 (×3): 10 meq via INTRAVENOUS
  Filled 2020-12-06 (×3): qty 100

## 2020-12-06 MED ORDER — PROSOURCE TF PO LIQD
45.0000 mL | Freq: Two times a day (BID) | ORAL | Status: DC
  Filled 2020-12-06 (×4): qty 45

## 2020-12-06 MED ORDER — HEPARIN (PORCINE) 25000 UT/250ML-% IV SOLN
1100.0000 [IU]/h | INTRAVENOUS | Status: DC
  Administered 2020-12-06: 1100 [IU]/h via INTRAVENOUS
  Filled 2020-12-06: qty 250

## 2020-12-06 MED ORDER — POTASSIUM PHOSPHATES 15 MMOLE/5ML IV SOLN
20.0000 mmol | Freq: Once | INTRAVENOUS | Status: AC
  Administered 2020-12-06: 20 mmol via INTRAVENOUS
  Filled 2020-12-06: qty 6.67

## 2020-12-06 MED ORDER — CHLORHEXIDINE GLUCONATE CLOTH 2 % EX PADS
6.0000 | MEDICATED_PAD | Freq: Every day | CUTANEOUS | Status: DC
  Administered 2020-12-06 – 2020-12-16 (×11): 6 via TOPICAL

## 2020-12-06 MED ORDER — AMIODARONE HCL IN DEXTROSE 360-4.14 MG/200ML-% IV SOLN
60.0000 mg/h | INTRAVENOUS | Status: DC
  Administered 2020-12-06 (×2): 60 mg/h via INTRAVENOUS
  Filled 2020-12-06: qty 200

## 2020-12-06 MED ORDER — AMIODARONE HCL IN DEXTROSE 360-4.14 MG/200ML-% IV SOLN
60.0000 mg/h | INTRAVENOUS | Status: DC
  Filled 2020-12-06: qty 200

## 2020-12-06 MED ORDER — SODIUM CHLORIDE 0.9 % IV BOLUS
500.0000 mL | Freq: Once | INTRAVENOUS | Status: AC
  Administered 2020-12-06: 500 mL via INTRAVENOUS

## 2020-12-06 NOTE — Progress Notes (Signed)
Previosuly approved IR procedure for a gastrostomy tube placement and PICC placement has been modified by Team to a gastrostomy tube placement and a tunneled central catheter. Consent for both procedures obtained from daughter and is in the IR RN office. Patient has decompensated and is being transferred to the ICU due to tachycardia with soft pressures. Patient will be tentatively placed on IR schedule for Friday March 4th based upon patient condition. Should patient stabilize Team instructed to: Keep Patient to be NPO after midnight Hold prophylactic anticoagulation 24 hours prior to scheduled procedure. IR will call patient when ready.

## 2020-12-06 NOTE — Consult Note (Signed)
Consultation Note Date: 12/06/2020   Patient Name: Chelsea Glass  DOB: 21-Oct-1955  MRN: [id number]  Age / Sex: 65 y.o., female  PCP: [facility] Referring Physician: Enedina Finner, MD  Reason for Consultation: Establishing goals of care  HPI/Patient Profile: Chelsea Glass is a 65 y.o. African-American female with medical history significant for osteoarthritis, type 2 diabetes mellitus and hypertension, as well as COVID-19 in September 2021, who presented to the emergency room with acute onset of altered mental status and not acting herself for the last 4 days with excessive sleepiness.  Clinical Assessment and Goals of Care: Patient moved to ICU. Daughter is present. She states she and her mother live together. She states prior to covid in September, her mother was fully functional and independent. She discusses the complications of her clinical course with covid. She states she has declined since September. We discussed her mother not speaking, and only smiling yesterday during my visit. Daughter tells me her mother was speaking, and fully communicative up until days before her admission here. She states she would like to continue care and work up.  Discussed beginning to consider boundaries for acceptable QOL. Currently full code/full scope.       SUMMARY OF RECOMMENDATIONS   Full code/full scope. Will continue to follow.       Primary Diagnoses: Present on Admission:  AKI (acute kidney injury) (HCC)   I have reviewed the medical record, interviewed the patient and family, and examined the patient. The following aspects are pertinent.  Past Medical History:  Diagnosis Date   Arthritis    Diabetes mellitus without complication (HCC)    Hypertension    Social History   Socioeconomic History   Marital status: Widowed    Spouse name: Not on file   Number of  children: Not on file   Years of education: Not on file   Highest education level: Not on file  Occupational History   Not on file  Tobacco Use   Smoking status: Never Smoker   Smokeless tobacco: Never Used  Substance and Sexual Activity   Alcohol use: No   Drug use: No   Sexual activity: Not on file  Other Topics Concern   Not on file  Social History Narrative   Not on file   Social Determinants of Health   Financial Resource Strain: Not on file  Food Insecurity: Not on file  Transportation Needs: Not on file  Physical Activity: Not on file  Stress: Not on file  Social Connections: Not on file   History reviewed. No pertinent family history. Scheduled Meds:  amiodarone  150 mg Intravenous Once   Chlorhexidine Gluconate Cloth  6 each Topical Daily   digoxin  0.25 mg Intravenous Once   DULoxetine  30 mg Oral Daily   feeding supplement (PROSource TF)  45 mL Per Tube BID   heparin  4,000 Units Intravenous Once   insulin aspart  0-9 Units Subcutaneous Q4H   metoprolol succinate  25 mg Oral Daily  mirtazapine  15 mg Oral QHS   potassium chloride  40 mEq Oral Once   Continuous Infusions:  amiodarone     Followed by   amiodarone     dextrose 5 % and 0.9% NaCl 125 mL/hr at 12/06/20 0930   feeding supplement (OSMOLITE 1.5 CAL) Stopped (12/06/20 1150)   heparin     potassium chloride Stopped (12/06/20 1138)   PRN Meds:.acetaminophen **OR** acetaminophen, magnesium hydroxide, ondansetron **OR** ondansetron (ZOFRAN) IV Medications Prior to Admission:  Prior to Admission medications   Medication Sig Start Date End Date Taking? Authorizing Provider  DULoxetine (CYMBALTA) 30 MG capsule Take 30 mg by mouth daily. 11/18/20  Yes [provider]  metoprolol succinate (TOPROL-XL) 100 MG 24 hr tablet Take 100 mg by mouth daily. 11/30/20  Yes [provider]  mirtazapine (REMERON) 15 MG tablet Take 15 mg by mouth at bedtime. 10/06/20  Yes  [provider]   Allergies  Allergen Reactions   Lisinopril Swelling   Review of Systems  Unable to perform ROS   Physical Exam Constitutional:      Comments: Eyes open spontaneously. Nonverbal.      Vital Signs: BP 111/85    Pulse (!) 139    Temp 98.2 F (36.8 C) (Axillary)    Resp 16    Ht 5\' 5"  (1.651 m)    Wt 95.8 kg    SpO2 98%    BMI 35.16 kg/m  Pain Scale: 0-10 POSS *See Group Information*: 1-Acceptable,Awake and alert Pain Score: Asleep   SpO2: SpO2: 98 % O2 Device:SpO2: 98 % O2 Flow Rate: .   IO: Intake/output summary:   Intake/Output Summary (Last 24 hours) at 12/06/2020 1222 Last data filed at 12/06/2020 02/05/2021 Gross per 24 hour  Intake 1570.94 ml  Output --  Net 1570.94 ml    LBM: Last BM Date: 12/02/20 Baseline Weight: Weight: 95.8 kg Most recent weight: Weight: 95.8 kg      Time In: 11:20 Time Out: 11:50 Time Total: 30 min Greater than 50%  of this time was spent counseling and coordinating care related to the above assessment and plan.  Signed by: 12/04/20, NP   Please contact Palliative Medicine Team phone at [phone] for questions and concerns.  For individual provider: See [phone]

## 2020-12-06 NOTE — Progress Notes (Addendum)
[facility]  - [place] [facility]   PATIENT NAME: Chelsea Glass    MR#:  [id number]  DATE OF BIRTH:  09/20/1956  SUBJECTIVE:  patient appears to be a poor historian.  Patient not able to have any meaningful conversation. She keeps her eyes closed. Just moans on when asked question.  Patient was very tachycardic in the nighttime but soft blood pressure received IV fluid bolus. Chest x-ray done last night does not show pneumonia. Received another bolus this morning however remains tachycardic with heart rate in the 140s. Will transfer patient to ICU.  REVIEW OF SYSTEMS:   Review of Systems  Unable to perform ROS: Mental status change  Constitutional: Negative for chills, fever and weight loss.  HENT: Negative for ear discharge, ear pain and nosebleeds.   Eyes: Negative for blurred vision, pain and discharge.  Respiratory: Negative for sputum production, shortness of breath, wheezing and stridor.   Cardiovascular: Negative for chest pain, palpitations, orthopnea and PND.  Gastrointestinal: Negative for abdominal pain, diarrhea, nausea and vomiting.  Genitourinary: Negative for frequency and urgency.  Musculoskeletal: Negative for back pain and joint pain.  Neurological: Negative for sensory change, speech change, focal weakness and weakness.  Psychiatric/Behavioral: Negative for depression and hallucinations. The patient is not nervous/anxious.    Tolerating Diet:no Tolerating PT: SNF  DRUG ALLERGIES:   Allergies  Allergen Reactions  . Lisinopril Swelling    VITALS:  Blood pressure 111/85, pulse (!) 139, temperature 98.2 F (36.8 C), temperature source Axillary, resp. rate 16, height 5\' 5"  (1.651 m), weight 95.8 kg, SpO2 98 %.  PHYSICAL EXAMINATION:   Physical Exam limited GENERAL:  65 y.o.-year-old patient lying in the bed with no acute distress.  Chronically ill LUNGS: Normal breath sounds bilaterally, no wheezing, rales, rhonchi. CARDIOVASCULAR:  S1, S2 normal.tachycardia+ ABDOMEN: Soft, nontender, nondistended.  EXTREMITIES: No cyanosis, clubbing or edema b/l.    NEUROLOGIC: limited exam. Patient appears grossly nonfocal neuro- exam moves extremities spontaneously PSYCHIATRIC:  Lethargy+ Mood appears depressed and affect flat  SKIN: No obvious rash, lesion, or ulcer.    LABORATORY PANEL:  CBC Recent Labs  Lab 12/06/20 0754  WBC 9.4  HGB 10.8*  HCT 30.8*  PLT 269    Chemistries  Recent Labs  Lab 12/03/20 1301 12/03/20 1437 12/05/20 0702 12/06/20 0505  NA 136  --   --  143  K 3.1*  --   --  3.1*  3.1*  CL 102  --   --  114*  CO2 18*  --   --  19*  GLUCOSE 124*  --   --  94  BUN 29*  --   --  23  CREATININE 1.46*  --   --  1.21*  CALCIUM 9.7  --   --  8.8*  MG  --    < > 1.7  --   AST 37  --   --   --   ALT 37  --   --   --   ALKPHOS 366*  --   --   --   BILITOT 2.2*  --   --   --    < > = values in this interval not displayed.   Cardiac Enzymes No results for input(s): TROPONINI in the last 168 hours. RADIOLOGY:  MR BRAIN WO CONTRAST  Result Date: 12/05/2020 CLINICAL DATA:  Rule out stroke.  Diabetes and hypertension EXAM: MRI HEAD WITHOUT CONTRAST TECHNIQUE: Multiplanar, multiecho pulse sequences of the brain and  surrounding structures were obtained without intravenous contrast. COMPARISON:  CT head 12/03/2020 FINDINGS: Brain: Generalized atrophy. Negative for hydrocephalus. Negative for acute infarct. Negative for hemorrhage. Empty sella, unchanged from prior studies. 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 This has heterogeneous signal intensity on T1 and T2 and increased signal on diffusion-weighted imaging. Mild mass-effect on the tectum. This was present on the CT of Feb 22, 2017 with possible mild interval growth. Vascular: Negative for hyperdense vessel Skull and upper cervical spine: No focal skeletal lesion. Sinuses/Orbits: Paranasal sinuses clear.  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 Recommend follow-up MRI brain with contrast. Possible mild growth since 2018. Electronically Signed   By: Marlan Palau M.D.   On: 12/05/2020 19:05   DG Chest Port 1 View  Result Date: 12/06/2020 CLINICAL DATA:  Tachycardia EXAM: PORTABLE CHEST 1 VIEW COMPARISON:  06/17/2020 FINDINGS: Cardiac shadow is stable. Aortic calcifications are again seen. Diffuse airspace opacities are noted but significantly improved when compared with the prior exam consistent with residual scarring from prior COVID-19 infection. No sizable effusion is noted. No bony abnormality is seen. IMPRESSION: Bilateral scarring improved from the prior exam likely related to COVID-19 sequelae. Electronically Signed   By: Alcide Clever M.D.   On: 12/06/2020 08:47   ASSESSMENT AND PLAN:  Chelsea Glass is a 65 y.o. African-American female with medical history significant for osteoarthritis, type 2 diabetes mellitus and hypertension, as well as COVID-19 in September 2021, who presented to the emergency room with acute onset of altered mental status and not acting herself for the last 4 days with excessive sleepiness. She has been having nausea and dry heaves without abdominal pain.  Her daughter stated that she has not eaten much and has been bed bound since September 2021 since her COVID-19 infection.    Adult failure to thrive/poor PO intake/generalized deconditioning Hypokalemia/depressed mood -- patient came in with ongoing weight loss and poor PO intake per daughter. Since COVID infection in September 2021 per daughter patient has been going downhill. --She was admitted at [facility] after discharge from [facility] with adult failure to thrive and poor PO intake. Patient also went to rehab in [place]. -- She recently was seen in [facility] emergency room on 10th of February she was discharged to go home with home health and daughter thinking patient will hopefully do  better with her mood and eating. -- Daughter says patient has lost more than hundred pounds since September 2021. She has not ambulated and no interest in any acitivity. Stays in bed most of the time --she is requesting if patient can get a feeding tube -- dietitian consult-- for PEG/NG feeding -- speech therapy-- patient showed very little interest in participating with speech therapy. Pt has Dsyphagia and at risk for aspiration. --spoke with IR RN regarding PICC line and G-tube/peg tube placement  --discussed with daughter risk and complications of feeding tube, she voiced understanding patient's daughter will sign the consent. --3/1-- patient is tachycardic despite IV fluids and IV metoprolol 5 mg x1 dose soft blood pressure and continued encephalopathy. Will's transfer her to ICU --spoke with ICU NP Chelsea Glass to follow pt.--pt will need Central line due to poor IV access.  Hypotension with sinus tachycardia, acute renal failure hypokalemia -- continue IV fluids. -- Patient did not respond to IV bolus and IV metoprolol. Will move her to the ICU. Consider pressers if needed. --creat 1.46--1.21 -- pharmacy  to replace electrolytes  ABnormal MRI brain with ?pineal gland mass -- curb sided neurology-- MRI does not show hydrocephalus. Neuro- recommends discussed with neurosurgery. --3/1-- per Dr Adriana Simas "This looks rather stable compared to CT from previous. This is likely benign, possible even cystic. She would need a contrasted image to delineate. No urgent intervention needed as we know it has been there for some time. Unrelated to current issues and wont cause encephalopathy  --will do MRI with contrast at later date once more stable and consult neurosurgery then if need be  Abnormal ultrasound abdomen with gallbladder distention similar to previous imaging studies -- clinically patient does not appear to be septic or having any infection -- discontinue IV antibiotics-- no source of infection  noted so far, chest x-ray negative for pneumonia. Appears to have some opacities from previous COVID infection.sats are more than 92% on room air -- patient was seen by surgery Dr. Lady Gary-- agree no surgical indication at present for gallbladder. Labs are stable.  Depressed mood -- continue Cymbalta , remeron --  psych consult with Dr. Toni Amend.   -- patient has appointment for geriatric evaluation at [facility] per daughter in April 2022  Type II diabetes with sugars controlled -- sliding scale insulin -- hold PO diabetes meds  History of tachycardia/SVT -- patient follows with cardiology at [facility] -- recently started on Toprol-XL hundred milligrams daily. With soft blood pressure decreased dose to 25 mg daily  Generalized deconditioning -- PT evaluation noted--rec SNF  Palliative care to have goals of discussion with family. Patient overall has declined clinically. Spoke with daughter Chelsea Glass at [employer]. She understands patient has long term poor prognosis. She would want to continue current care and see clinical course.  Procedures: Family communication : Chelsea Glass daughter in ICU waiting area Consults : surgery, ICU NP CODE STATUS: full code per daughter Chelsea Glass DVT Prophylaxis : Lovenox Level of care: Med-Surg Status is: Inpatient  Remains inpatient appropriate because:IV treatments appropriate due to intensity of illness or inability to take PO and Inpatient level of care appropriate due to severity of illness   Dispo: The patient is from: Home              Anticipated d/c is to: TBD              Patient currently is not medically stable to d/c.   Difficult to place patient No   TOTAL TIME TAKING CARE OF THIS PATIENT: 25 minutes.  >50% time spent on counselling and coordination of care  Note: This dictation was prepared with Dragon dictation along with smaller phrase technology. Any transcriptional errors that result from this process are unintentional.  Chelsea Glass M.D    [facility]   CC: Primary care physician; [facility]Patient ID: Chelsea Glass, female   DOB: Feb 09, 1956, 65 y.o.   MRN: [id number]

## 2020-12-06 NOTE — Progress Notes (Signed)
Pt to go down for MRI with transport and Engineer, drilling. Report given to Serenity, Charity fundraiser.

## 2020-12-06 NOTE — Consult Note (Signed)
PHARMACY CONSULT NOTE - FOLLOW UP  Pharmacy Consult for Electrolyte Monitoring and Replacement   Recent Labs: Potassium (mmol/L)  Date Value  12/06/2020 3.1 (L)  12/06/2020 3.1 (L)   Magnesium (mg/dL)  Date Value  54/65/0354 2.2   Calcium (mg/dL)  Date Value  65/68/1275 8.8 (L)   Albumin (g/dL)  Date Value  17/00/1749 2.0 (L)   Phosphorus (mg/dL)  Date Value  44/96/7591 1.6 (L)   Sodium (mmol/L)  Date Value  12/06/2020 143     Assessment: K+ 3.1, Phosphorus 1.6, Corrected Ca+ 10.4 All other electrolytes WNL  Goal of Therapy:  Electrolytes WNL  Plan:  Patient was ordered potassium chloride 10 mEq IV x 6 doses but only 3 doses were given. Will order another 3 doses of potassium chloride 10 mEq IV. Will order potassium phosphate 20 mmol IV Recheck CMP, mag, and phos with AM labs   Derrek Gu ,PharmD Clinical Pharmacist 12/06/2020 7:12 PM

## 2020-12-06 NOTE — Progress Notes (Signed)
   12/06/20 0409  Assess: MEWS Score  Temp 99.4 F (37.4 C)  BP 100/63  Pulse Rate (!) 115  Resp 18  SpO2 95 %  O2 Device Room Air  Assess: MEWS Score  MEWS Temp 0  MEWS Systolic 1  MEWS Pulse 2  MEWS RR 0  MEWS LOC 0  MEWS Score 3  MEWS Score Color Yellow  Assess: if the MEWS score is Yellow or Red  Were vital signs taken at a resting state? Yes  Focused Assessment No change from prior assessment  Early Detection of Sepsis Score *See Row Information* Low  MEWS guidelines implemented *See Row Information* Yes  Treat  MEWS Interventions Administered prn meds/treatments  Pain Scale 0-10  Pain Score Asleep  Take Vital Signs  Increase Vital Sign Frequency  Yellow: Q 2hr X 2 then Q 4hr X 2, if remains yellow, continue Q 4hrs  Escalate  MEWS: Escalate Yellow: discuss with charge nurse/RN and consider discussing with provider and RRT  Notify: Charge Nurse/RN  Name of Charge Nurse/RN Notified Chiquita Loth, RN  Date Charge Nurse/RN Notified 12/06/20  Time Charge Nurse/RN Notified [id number]  Notify: Provider  Provider Name/Title Manuela Schwartz, NP  Date Provider Notified 12/06/20  Time Provider Notified [phone]  Notification Type Page  Notification Reason Change in status (Pt is now a yellow mews; retaining urine)  Provider response See new orders (bolus)  Date of Provider Response 12/06/20  Time of Provider Response 0430

## 2020-12-06 NOTE — Consult Note (Addendum)
Chelsea Glass is a 65 y.o. female  [id number]  Primary Cardiologist: Adrian Blackwater Reason for Consultation: A fib with RVR  HPI: Patient is a 65 year old female with past medical history of DM 2, hypertension, OA, and COVID-19 infection in September 2021 who presents to the emergency room with altered mental status.  Patient has had failure to thrive ever since COVID-19 infection and has since has had no appetite and lost over 100 pounds per the daughter.  On admission CT scanning showed possible multifocal pneumonia versus aspiration pneumonitis.  Patient later developed sinus tachycardia which then transitioned to atrial fibrillation with RVR.  Patient's heart rate was unaffected by previous metoprolol 5 mg IVP.  Patient has since been transferred to the ICU for further management.  We have been consulted to help with atrial fibrillation with RVR management.   Review of Systems: Patient with continued altered mental status. Patient did not speak but shook her head no to regarding if she had chest pain, dyspnea, or pain.   Past Medical History:  Diagnosis Date  . Arthritis   . Diabetes mellitus without complication (HCC)   . Hypertension     Medications Prior to Admission  Medication Sig Dispense Refill  . DULoxetine (CYMBALTA) 30 MG capsule Take 30 mg by mouth daily.    . metoprolol succinate (TOPROL-XL) 100 MG 24 hr tablet Take 100 mg by mouth daily.    . mirtazapine (REMERON) 15 MG tablet Take 15 mg by mouth at bedtime.       Marland Kitchen amiodarone  150 mg Intravenous Once  . Chlorhexidine Gluconate Cloth  6 each Topical Daily  . digoxin  0.25 mg Intravenous Once  . DULoxetine  30 mg Oral Daily  . enoxaparin (LOVENOX) injection  40 mg Subcutaneous Q24H  . feeding supplement (PROSource TF)  45 mL Per Tube BID  . insulin aspart  0-9 Units Subcutaneous TID PC & HS  . metoprolol succinate  25 mg Oral Daily  . mirtazapine  15 mg Oral QHS  . potassium chloride  40 mEq Oral Once     Infusions: . amiodarone     Followed by  . amiodarone    . dextrose 5 % and 0.9% NaCl 125 mL/hr at 12/06/20 0930  . feeding supplement (OSMOLITE 1.5 CAL) Stopped (12/06/20 1150)  . potassium chloride Stopped (12/06/20 1138)    Allergies  Allergen Reactions  . Lisinopril Swelling    Social History   Socioeconomic History  . Marital status: Widowed    Spouse name: Not on file  . Number of children: Not on file  . Years of education: Not on file  . Highest education level: Not on file  Occupational History  . Not on file  Tobacco Use  . Smoking status: Never Smoker  . Smokeless tobacco: Never Used  Substance and Sexual Activity  . Alcohol use: No  . Drug use: No  . Sexual activity: Not on file  Other Topics Concern  . Not on file  Social History Narrative  . Not on file   Social Determinants of Health   Financial Resource Strain: Not on file  Food Insecurity: Not on file  Transportation Needs: Not on file  Physical Activity: Not on file  Stress: Not on file  Social Connections: Not on file  Intimate Partner Violence: Not on file    History reviewed. No pertinent family history.  PHYSICAL EXAM: Vitals:   12/06/20 0927 12/06/20 1002  BP: 98/64 111/85  Pulse: Marland Kitchen)  144 (!) 139  Resp: 17 16  Temp: 98.2 F (36.8 C)   SpO2: 96% 98%     Intake/Output Summary (Last 24 hours) at 12/06/2020 1151 Last data filed at 12/06/2020 0651 Gross per 24 hour  Intake 1570.94 ml  Output --  Net 1570.94 ml    General:  Well appearing. No respiratory difficulty HEENT: normal Neck: supple. no JVD. Carotids 2+ bilat; no bruits. No lymphadenopathy or thryomegaly appreciated. Cor: PMI nondisplaced. Regular rate & rhythm. No rubs, gallops or murmurs. Lungs: clear Abdomen: soft, nontender, nondistended. No hepatosplenomegaly. No bruits or masses. Good bowel sounds. Extremities: no cyanosis, clubbing, rash, edema Neuro: alert & oriented x 3, cranial nerves grossly intact.  moves all 4 extremities w/o difficulty. Affect pleasant.  ECG: Most recent 3/1 6:23 - A fib with RVR. 146 / bpm. Nonspecific t wave abnormality in inferior leads.  Results for orders placed or performed during the hospital encounter of 12/03/20 (from the past 24 hour(s))  Glucose, capillary     Status: None   Collection Time: 12/05/20 11:55 AM  Result Value Ref Range   Glucose-Capillary 83 70 - 99 mg/dL  Glucose, capillary     Status: Abnormal   Collection Time: 12/05/20  4:18 PM  Result Value Ref Range   Glucose-Capillary 111 (H) 70 - 99 mg/dL  Glucose, capillary     Status: Abnormal   Collection Time: 12/05/20  8:53 PM  Result Value Ref Range   Glucose-Capillary 105 (H) 70 - 99 mg/dL  Potassium     Status: Abnormal   Collection Time: 12/06/20  5:05 AM  Result Value Ref Range   Potassium 3.1 (L) 3.5 - 5.1 mmol/L  VITAMIN D 25 Hydroxy (Vit-D Deficiency, Fractures)     Status: None   Collection Time: 12/06/20  5:05 AM  Result Value Ref Range   Vit D, 25-Hydroxy 37.81 30 - 100 ng/mL  Vitamin B12     Status: Abnormal   Collection Time: 12/06/20  5:05 AM  Result Value Ref Range   Vitamin B-12 1,905 (H) 180 - 914 pg/mL  Basic metabolic panel     Status: Abnormal   Collection Time: 12/06/20  5:05 AM  Result Value Ref Range   Sodium 143 135 - 145 mmol/L   Potassium 3.1 (L) 3.5 - 5.1 mmol/L   Chloride 114 (H) 98 - 111 mmol/L   CO2 19 (L) 22 - 32 mmol/L   Glucose, Bld 94 70 - 99 mg/dL   BUN 23 8 - 23 mg/dL   Creatinine, Ser 8.98 (H) 0.44 - 1.00 mg/dL   Calcium 8.8 (L) 8.9 - 10.3 mg/dL   GFR, Estimated 49 (L) >60 mL/min   Anion gap 10 5 - 15  CBC     Status: Abnormal   Collection Time: 12/06/20  7:54 AM  Result Value Ref Range   WBC 9.4 4.0 - 10.5 K/uL   RBC 3.74 (L) 3.87 - 5.11 MIL/uL   Hemoglobin 10.8 (L) 12.0 - 15.0 g/dL   HCT 42.1 (L) 03.1 - 28.1 %   MCV 82.4 80.0 - 100.0 fL   MCH 28.9 26.0 - 34.0 pg   MCHC 35.1 30.0 - 36.0 g/dL   RDW 18.8 (H) 67.7 - 37.3 %   Platelets  269 150 - 400 K/uL   nRBC 0.0 0.0 - 0.2 %  D-dimer, quantitative     Status: Abnormal   Collection Time: 12/06/20  7:54 AM  Result Value Ref Range   D-Dimer, Sharene Butters  1.48 (H) 0.00 - 0.50 ug/mL-FEU  Glucose, capillary     Status: Abnormal   Collection Time: 12/06/20 10:08 AM  Result Value Ref Range   Glucose-Capillary 112 (H) 70 - 99 mg/dL   MR BRAIN WO CONTRAST  Result Date: 12/05/2020 CLINICAL DATA:  Rule out stroke.  Diabetes and hypertension EXAM: MRI HEAD WITHOUT CONTRAST TECHNIQUE: Multiplanar, multiecho pulse sequences of the brain and surrounding structures were obtained without intravenous contrast. COMPARISON:  CT head 12/03/2020 FINDINGS: Brain: Generalized atrophy. Negative for hydrocephalus. Negative for acute infarct. Negative for hemorrhage. Empty sella, unchanged from prior studies. 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 This has heterogeneous signal intensity on T1 and T2 and increased signal on diffusion-weighted imaging. Mild mass-effect on the tectum. This was present on the CT of Feb 22, 2017 with possible mild interval growth. Vascular: Negative for hyperdense vessel Skull and upper cervical spine: No focal skeletal lesion. Sinuses/Orbits: Paranasal sinuses clear.  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 Recommend follow-up MRI brain with contrast. Possible mild growth since 2018. Electronically Signed   By: Marlan Palau M.D.   On: 12/05/2020 19:05   DG Chest Port 1 View  Result Date: 12/06/2020 CLINICAL DATA:  Tachycardia EXAM: PORTABLE CHEST 1 VIEW COMPARISON:  06/17/2020 FINDINGS: Cardiac shadow is stable. Aortic calcifications are again seen. Diffuse airspace opacities are noted but significantly improved when compared with the prior exam consistent with residual scarring from prior COVID-19 infection. No sizable effusion is noted. No bony abnormality is seen. IMPRESSION: Bilateral scarring  improved from the prior exam likely related to COVID-19 sequelae. Electronically Signed   By: Alcide Clever M.D.   On: 12/06/2020 08:47     ASSESSMENT AND PLAN: Patient presenting to emergency department with altered mental status.  Patient continues to thrive since most recent COVID-19 infection.  Currently the patient does not have any IV access and I spoke with ICU NP implants to place a central line.  Once central line is placed please initiate amiodarone loading dose with infusion.  In addition we will place orders for digoxin IVP if patient's heart rate does not stabilize.  We will also order heparin infusion with a chads vas score of 4.  We will continue to monitor.  Maryelizabeth Kaufmann NP-C

## 2020-12-06 NOTE — Progress Notes (Signed)
Speech Language Pathology Treatment: Dysphagia  Patient Details Name: Chelsea Glass MRN: [id number] DOB: Jan 22, 1956 Today's Date: 12/06/2020 Time: 1655-3748 SLP Time Calculation (min) (ACUTE ONLY): 40 min  Assessment / Plan / Recommendation Clinical Impression  Pt appears to present w/ risk for oropharyngeal phase dysphagia in light of Significantly declined Cognitive status. Per MD discussion, there may be a component of Psychiatric illness impact in her disengagement in verbal and oral tasks. This presentation impacts her overall awareness/engagement and safety during po tasks which increases risk for aspiration, choking. Pt is at risk for aspiration, as well as inability to orally meet nutritional needs fully. Poor oral intake has been ongoing for months at home per family report. A PEG placement is planned for this Friday per MD.  During this session today, pt presented w/ min more engagement w/ this SLP and nodded head to offers of "Something to wet your mouth?". She also shook her head "no" when offered certain drinks vs others(to which she agreed to w/ a head nod "yes"). She requires Mod tactile/verbal/ visual cues for orientation to bolus presentation, and during feeding support(mostly nonverbal; followed 2 commands when asked by this SLP). Pt consumed (and accepted) only few trials of ice chips and TSP sips of thin liquids w/ No immediate, overt clinical s/s of aspiration noted; no cough, and no decline in respiratory status during/post trials(O2 sats 98%, HR min elevated during full session). Unable to assess vocal quality d/t primarily nonverbal status. Oral phase was adequate for bolus acceptance and management and oral clearing of the boluses given. She appeared to manage the boluses well but did not orally attend to take more when she did not want to. No unilateral weakness noted == she made faces to the taste of apple juice x2.   D/t pt's declined Mental Status, and her risk for  aspiration, recommend continue NPO status w/ Pleasure Ice Chips (single) when pt is alert and engages actively w/ feeder accepting the ice chip trials and orally clearing them; aspiration precautions; reduce Distractions during trials. Support w/ feeding and give encouragement. Frequent oral care for hygiene and stimulation Before Pleasure ice chips and during day. Consulted MD, NSG updated. MD stated the plan is for a PEG placement for nutritional needs. ST services recommends f/u post D/C for ongoing oral stimulation and hopefully some oral intake to engage swallowing, the gut, and the mind; as well as education for pt/family then. Noted Palliative Care ongoing for [place]. Largely suspect that pt's Mental Status and behaviors could continue to hamper oral intake and upgrade to an oral diet. Precautions posted in room for Pleasure ice chips, oral care. MD agreed.     HPI HPI: Per admitting H&P "Chelsea Glass is a 65 y.o. African-American female with medical history significant for osteoarthritis, type 2 diabetes mellitus and hypertension, as well as COVID-19 in September 2021, who presented to the emergency room with acute onset of altered mental status and not acting herself for the last 4 days with excessive sleepiness. She has been having nausea and dry heaves without abdominal pain.  Her daughter stated that she has not eaten much and has been bed bound since September 2021 since her COVID-19 infection.  No dysuria, oliguria or hematuria or flank pain.  No chest pain or palpitations.  No headache or dizziness or blurred vision.  No paresthesias or focal muscle weakness. "      SLP Plan  All goals met (at this time)       Recommendations  Diet recommendations: NPO (w/ ice chips for Pleasure; pt pending PEG placement Fri.) Medication Administration: Via alternative means Supervision: Full supervision/cueing for compensatory strategies Postural Changes and/or Swallow Maneuvers: Seated upright 90  degrees                General recommendations:  (Dietician following) Oral Care Recommendations: Oral care before and after PO;Oral care QID;Staff/trained caregiver to provide oral care Follow up Recommendations:  (f/u post D/C) SLP Visit Diagnosis: Dysphagia, oropharyngeal phase (R13.12) Plan: All goals met (at this time)       [place], [place], [place] [facility]ologist [facility] [phone] [facility] 12/06/2020, 4:00 PM

## 2020-12-06 NOTE — Consult Note (Signed)
NAME:  Chelsea Glass, MRN:  [id number], DOB:  [birth year], LOS: 3 ADMISSION DATE:  12/03/2020, CONSULTATION DATE:  12/06/2020 REFERRING MD:  Dr. Posey Pronto, CHIEF COMPLAINT:  Tachycardia, transient hypotension  Brief History:  65 y.o. Female with a PMH significant for COVID-19 in September 2021 with progressive decline ever since, admitted with Altered Mental Status and Failure to Thrive.  On 12/06/20 with Sinus Tachycardia with transition to Atrial Fibrillation w/ RVR, along with transient hypotension requiring transfer to Stepdown.  History of Present Illness:  Chelsea Glass is a 65 y.o. African-American female with a past medical history significant for COVID-19 in September 2021, Hypertension, Diabetes Mellitus Type II, and Osteoarthritis who presented to [facility] ED on 12/03/2020 due to Altered Mental Status and "not acting like herself" for the previous 4 days.  ED Course: Vital signs were initially within normal.  Labs revealed hypokalemia 3.1 and BUN of 29 with creatinine 1.46 above previous levels, alk phos 3066 with albumin of 2.6 and total protein 6.4, total bili of 2.2 and lipase was 48.  Magnesium was 1.7.  High-sensitivity troponin I was 12 and CBC showed leukocytosis of 12.7. COVID-19 PCR and influenza antigens came back negative. EKG as reviewed by me : Showed sinus rhythm with rate of 84 with abnormal R wave progression with T wave inversion in V2 and Q waves in lead III Imaging: Noncontrasted head CT scan revealed mild parenchymal atrophy and chronic microvascular disease with no acute intracranial normalities.  Abdominal and pelvic CT scan revealed the following: 1. No evidence of obstructive uropathy or nephrolithiasis. 2. Calcific density within the dependent portion of the right hand side of the urinary bladder may represent a collection of minute calculi/crystals. 3. Streaky and patchy airspace opacities with peripheral predominance in the bilateral lung bases. This may  represent multifocal pneumonia or aspiration pneumonitis. Small left pleural effusion. 4. High density of the contents of the gallbladder, likely representing sludge. Please correlate to right upper quadrant ultrasound. 5. Leiomyomatous uterus. 6. Fat containing periumbilical anterior abdominal wall hernia. 7. Aortic atherosclerosis.  She was admitted to the Med-Surg unit by the Hospitalist for further workup and treatment of Acute Metabolic Encephalopathy and failure to thrive.    Hospital Course: General Surgery was consulted for gallbladder sludge seen on CT Abdomen, and was deemed no indication for surgical intervention.  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  Neurosurgery was consulted and felt mass was relatively stable from previous, likely benign, with no urgent intervention needed.  Recommended contrast imaging at later time when stable.  On 12/06/20 she became tachycardic (sinus tachycardia) which transitioned to Atrial Fibrillation w/ RVR, along with some transient hypotension requiring transfer to ICU.  Hypotension resolved with 500 cc NS bolus.    PCCM was consulted for further assistance, along with placement of central venous access due to lack of peripheral IV access.  Past Medical History:  Hypertension Diabetes Mellitus Arthritis  Significant Hospital Events:  2/26: Admission to [place] 3/1: Transfer to ICU due to Tachycardia and transient hypotension; Cardiology and PCCM consulted  Consults:  Hospitalist (primary service) General Surgery Psychiatry Palliative Care Cardiology PCCM  Procedures:  3/1: Left IJ CVC>>  Significant Diagnostic Tests:  12/03/20: CT Head w/o contrast>>1. No acute intracranial abnormality. 2. Mild brain parenchymal atrophy and chronic microvascular disease. 12/03/20: CT Abdomen & Pelvis>>1. No evidence of obstructive uropathy or nephrolithiasis. 2. Calcific density within the dependent portion of the right hand side of the  urinary bladder may represent a collection of  minute calculi/crystals. 3. Streaky and patchy airspace opacities with peripheral predominance in the bilateral lung bases. This may represent multifocal pneumonia or aspiration pneumonitis. Small left pleural effusion. 4. High density of the contents of the gallbladder, likely representing sludge. Please correlate to right upper quadrant ultrasound. 5. Leiomyomatous uterus. 6. Fat containing periumbilical anterior abdominal wall hernia. 7. Aortic atherosclerosis. 12/03/20: Abdominal US>>Gallbladder distention is noted without definite evidence of cholelithiasis or cholecystitis. 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 Recommend follow-up MRI brain with contrast. Possible mild growth since 2018. 12/06/20: CXR>> Cardiac shadow is stable. Aortic calcifications are again seen. Diffuse airspace opacities are noted but significantly improved when compared with the prior exam consistent with residual scarring from prior COVID-19 infection. No sizable effusion is noted. No bony abnormality is seen 12/06/20: MR Brain w/ contrast>>  Micro Data:  12/03/20: SARS-CoV-2 PCR>>negative 12/03/20: Influenza PCR>>negative 12/06/20: Blood culture x2>> 12/06/20: Urine>>  Antimicrobials:  Unasyn 2/26 x1 dose Zosyn 2/26>>2/27  Interim History / Subjective:  -Pt transferred to ICU due to Tachycardia (ST which transitioned to A.fib w/ RVR, HR 140's) and transient hypotension, responsive to 500 cc fluid bolus -Awake, nods to questions,  -Left IJ CVC placed due to no peripheral IV access -A.fib w/ RVR resolved with carotid message -Afebrile, now hemodynamically stable, on room air  Objective   Blood pressure 111/85, pulse (!) 139, temperature 98.2 F (36.8 C), temperature source Axillary, resp. rate 16, height 5' 5"  (1.651 m), weight 95.8  kg, SpO2 98 %.        Intake/Output Summary (Last 24 hours) at 12/06/2020 1021 Last data filed at 12/06/2020 5993 Gross per 24 hour  Intake 1570.94 ml  Output -  Net 1570.94 ml   Filed Weights   12/03/20 1124  Weight: 95.8 kg    Examination: General: Acutely ill appearing female, laying in bed, on room air, in NAD HENT: Atraumatic, normocephalic, neck supple, no JVD Lungs: Clear diminished bilaterally, no wheezes or rales, even, nonlabored Cardiovascular: irregularly irregular rhythm, no M/R/G Abdomen: Obese, soft, nontender, nondistended, no guarding or rebound tenderness Extremities: No deformities, no clubbing, 1+ edema BLE Neuro: Will follow commands, no focal deficits Skin: warm and dry.  No obvious rashes, lesions, or ulcerations  Resolved Hospital Problem list   N/A  Assessment & Plan:   Tachycardia / Atrial Fibrillation w/ RVR Transient Hypotension, suspect Hypovolemic +/- Cardiogenic Hx: Tachycardia/SVT -Continuous cardiac monitoring -Maintain MAP >65 -IV fluids -Was fluid responsive to 500 cc fluid bolus -Vasopressors if needed -Metoprolol decreased to 25 mg daily -Check troponin -Discussed with Dr. Mortimer Fries, will check Procalcitonin, blood cultures, and urinalysis -Cardiology consulted, appreciate input -Consider Echocardiogram   Failure to Thrive/Poor PO intake/Generalized Deconditioning -Plan for PEG tube placement (rescheduled for Friday 12/09/20); may need NG placed in the interim for tube feeds -Dietician consult -Speech consult -Palliative Care consulted, appreciate input   Acute metabolic Encephalopathy ? Pineal Gland Mass -Provide supportive care -MR does not show hydrocephalus -Dr. Posey Pronto discussed with Neurology who recommends consulting Neurosurgery;  Dr. Posey Pronto discussed with Dr. Lacinda Axon ~ seems stable compared to previous CT, likely benign -Will obtain MR Brain with contrast when feasible   Abdominal US with Gallbladder Distention (similar to  previous imaging) -Seen by General Surgery~ no surgical indication presently -IV antibiotics were previously discontinued -Follow LFT's   Best practice (evaluated daily)  Diet: NPO Pain/Anxiety/Delirium protocol (if indicated):  N/A VAP protocol (if indicated): N/A DVT prophylaxis: Heparin gtt GI prophylaxis: N/A Glucose control:N/A Mobility: As tolerated Disposition:Stepdown  Goals of Care:  Last date of multidisciplinary goals of care discussion:N/A Family and staff present: APP, Palliative APP, RN, pt's daughter at bedside Summary of discussion: Plan of care, place central line for access Follow up goals of care discussion due: 12/07/2020 Code Status: Full code  Labs   CBC: Recent Labs  Lab 12/03/20 1301 12/05/20 0432 12/06/20 0754  WBC 12.7* 12.2* 9.4  NEUTROABS  --  6.9  --   HGB 13.1 13.1 10.8*  HCT 38.4 37.9 30.8*  MCV 82.9 83.1 82.4  PLT 268 235 109    Basic Metabolic Panel: Recent Labs  Lab 12/03/20 1301 12/03/20 1437 12/05/20 0702 12/06/20 0505  NA 136  --   --  143  K 3.1*  --   --  3.1*  3.1*  CL 102  --   --  114*  CO2 18*  --   --  19*  GLUCOSE 124*  --   --  94  BUN 29*  --   --  23  CREATININE 1.46*  --   --  1.21*  CALCIUM 9.7  --   --  8.8*  MG  --  1.7 1.7  --   PHOS  --   --  2.8  --    GFR: Estimated Creatinine Clearance: 52.3 mL/min (A) (by C-G formula based on SCr of 1.21 mg/dL (H)). Recent Labs  Lab 12/03/20 1301 12/03/20 1437 12/05/20 0432 12/06/20 0754  PROCALCITON  --  0.17  --   --   WBC 12.7*  --  12.2* 9.4    Liver Function Tests: Recent Labs  Lab 12/03/20 1301  AST 37  ALT 37  ALKPHOS 366*  BILITOT 2.2*  PROT 6.4*  ALBUMIN 2.6*   Recent Labs  Lab 12/03/20 1301  LIPASE 48   No results for input(s): AMMONIA in the last 168 hours.  ABG    Component Value Date/Time   PHART 7.47 (H) 06/24/2020 0750   PCO2ART 37 06/24/2020 0750   PO2ART 32 (LL) 06/24/2020 0750   HCO3 26.9 06/24/2020 0750   O2SAT 66.8  06/24/2020 0750     Coagulation Profile: No results for input(s): INR, PROTIME in the last 168 hours.  Cardiac Enzymes: No results for input(s): CKTOTAL, CKMB, CKMBINDEX, TROPONINI in the last 168 hours.  HbA1C: Hgb A1c MFr Bld  Date/Time Value Ref Range Status  12/03/2020 01:01 PM 4.6 (L) 4.8 - 5.6 % Final    Comment:    (NOTE) Pre diabetes:          5.7%-6.4%  Diabetes:              >6.4%  Glycemic control for   <7.0% adults with diabetes   06/13/2020 01:21 PM 8.2 (H) 4.8 - 5.6 % Final    Comment:    (NOTE) Pre diabetes:          5.7%-6.4%  Diabetes:              >6.4%  Glycemic control for   <7.0% adults with diabetes     CBG: Recent Labs  Lab 12/05/20 0739 12/05/20 1155 12/05/20 1618 12/05/20 2053 12/06/20 1008  GLUCAP 86 83 111* 105* 112*    Review of Systems:   Unable to assess due to AMS  Past Medical History:  She,  has a past medical history of Arthritis, Diabetes mellitus without  complication ([facility]), and Hypertension.   Surgical History:   Past Surgical History:  Procedure Laterality Date  . TUBAL LIGATION    . UTERINE FIBROID SURGERY       Social History:   reports that she has never smoked. She has never used smokeless tobacco. She reports that she does not drink alcohol and does not use drugs.   Family History:  Her family history is not on file.   Allergies Allergies  Allergen Reactions  . Lisinopril Swelling     Home Medications  Prior to Admission medications   Medication Sig Start Date End Date Taking? Authorizing Provider  DULoxetine (CYMBALTA) 30 MG capsule Take 30 mg by mouth daily. 11/18/20  Yes [provider]  metoprolol succinate (TOPROL-XL) 100 MG 24 hr tablet Take 100 mg by mouth daily. 11/30/20  Yes [provider]  mirtazapine (REMERON) 15 MG tablet Take 15 mg by mouth at bedtime. 10/06/20  Yes [provider]     Critical care time: 50 minutes    Darel Hong, [facility]  Pulmonary & Critical Care Medicine Pager: [phone]

## 2020-12-06 NOTE — Progress Notes (Signed)
ANTICOAGULATION CONSULT NOTE  Pharmacy Consult for heparin Indication: atrial fibrillation  Allergies  Allergen Reactions  . Lisinopril Swelling    Patient Measurements: Height: 5\' 5"  (165.1 cm) Weight: 95.8 kg (211 lb 4.8 oz) IBW/kg (Calculated) : 57 Heparin Dosing Weight: 78 kg  Vital Signs: Temp: 98.2 F (36.8 C) (03/01 0927) Temp Source: Axillary (03/01 0927) BP: 111/85 (03/01 1002) Pulse Rate: 139 (03/01 1002)  Labs: Recent Labs    12/03/20 1437 12/05/20 0432 12/06/20 0505 12/06/20 0754  HGB  --  13.1  --  10.8*  HCT  --  37.9  --  30.8*  PLT  --  235  --  269  CREATININE  --   --  1.21*  --   TROPONINIHS 12  --   --   --     Estimated Creatinine Clearance: 52.3 mL/min (A) (by C-G formula based on SCr of 1.21 mg/dL (H)).   Medical History: Past Medical History:  Diagnosis Date  . Arthritis   . Diabetes mellitus without complication (HCC)   . Hypertension     Assessment: 65 year old female transferred to the ICU 3/1. Patient with generalized deconditioning and failure to thrive. Dysphagia limiting safe PO intake, plan for G-tube placement. Patient with afib with RVR. Patient to start on amiodarone. Consult for heparin drip for CHA2DS2VASc 4.  Goal of Therapy:  Heparin level 0.3-0.7 units/ml Monitor platelets by anticoagulation protocol: Yes   Plan:  Heparin 4000 unit bolus followed by heparin drip at 1100 units/hr. Waiting for IV access prior to starting heparin. Will check HL approximately 6 hrs after start of heparin drip. CBC daily while on heparin drip.  69, PharmD Clinical Pharmacist 12/06/2020,1:43 PM

## 2020-12-06 NOTE — Significant Event (Signed)
Rapid Response Event Note   Reason for Call :  Hypotensive and tachycardic  Initial Focused Assessment:       Interventions:    Plan of Care:  Rapid was called while I was speaking to Dr. Allena Katz- who was giving me an update.  Dr. Allena Katz stated patient HR 140's and BP low despite bolus given- Metoprolol given with no effect.    Event Summary:  Patient transferred to ICU for stepdown status. MD Notified: Dr. Allena Katz Call [id number] Arrival [id number] End [id number]  Williemae Natter, RN

## 2020-12-06 NOTE — Progress Notes (Signed)
Patient transferred to ICU4 from progressive care unit. HR in 140s rhythm appears to be afib. Dr. Allena Katz and Harlon Ditty, NP made aware. Cardiology consulted. Amio bolus ordered. Neither of patient's peripheral IVs are patent. Unable to administer IV medications at this time Elvina Sidle, NP aware. Will place central line once consent obtained from patient's daughter.

## 2020-12-06 NOTE — Progress Notes (Signed)
   12/06/20 0809  Assess: MEWS Score  Temp 98.9 F (37.2 C)  BP 97/66  Pulse Rate (!) 143  Resp 16  SpO2 97 %  O2 Device Room Air  Assess: MEWS Score  MEWS Temp 0  MEWS Systolic 1  MEWS Pulse 3  MEWS RR 0  MEWS LOC 0  MEWS Score 4  MEWS Score Color Red  Assess: if the MEWS score is Yellow or Red  Were vital signs taken at a resting state? Yes  Focused Assessment No change from prior assessment  Early Detection of Sepsis Score *See Row Information* Low  MEWS guidelines implemented *See Row Information* Yes  Treat  MEWS Interventions Administered prn meds/treatments  Pain Scale 0-10  Pain Score Asleep  Faces Pain Scale 0  Take Vital Signs  Increase Vital Sign Frequency  Red: Q 1hr X 4 then Q 4hr X 4, if remains red, continue Q 4hrs  Escalate  MEWS: Escalate Red: discuss with charge nurse/RN and provider, consider discussing with RRT  Notify: Charge Nurse/RN  Name of Charge Nurse/RN Notified Cloyde Reams, RN  Date Charge Nurse/RN Notified 12/06/20  Time Charge Nurse/RN Notified 0815  Notify: Provider  Provider Name/Title Enedina Finner, MD  Date Provider Notified 12/06/20  Time Provider Notified [phone]  Notification Type Face-to-face  Notification Reason Change in status  Provider response See new orders  Date of Provider Response 12/06/20  Time of Provider Response [phone]  Document  Patient Outcome Other (Comment) (Continuing to monitor)  Progress note created (see row info) Yes   IV bolus given. Will give IV metoprolol and continue to monitor.

## 2020-12-06 NOTE — Progress Notes (Signed)
X-ray for central line placement showed tip of line in the SVC, but resting on the wall of the SVC.  Catheter was withdrawn by 1 cm to 19 cm under sterile procedure.       Harlon Ditty, AGACNP-BC [facility] Pager: [phone]

## 2020-12-06 NOTE — Consult Note (Signed)
Neurosurgery-New Consultation Evaluation 12/06/2020 Annalee Meyerhoff [id number]  Identifying Statement: Shondra Capps is a 65 y.o. female from [place] [address] with altered mental status  Physician Requesting Consultation: Enedina Finner, MD  History of Present Illness: Ms Dalpe is admitted after presenting with failure to thrive and altered mental status. She has a history of DM but also recent COVID 19 last year. Since then, her family states she has not been eating well or performing much activity. She has had an extensive medical workup for this including imaging of the neuroaxis without any clear etiology. 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   She does have history of a CT in 2018 with a similar mass.   Past Medical History:  Past Medical History:  Diagnosis Date  . Arthritis   . Diabetes mellitus without complication (HCC)   . Hypertension     Social History: Social History   Socioeconomic History  . Marital status: Widowed    Spouse name: Not on file  . Number of children: Not on file  . Years of education: Not on file  . Highest education level: Not on file  Occupational History  . Not on file  Tobacco Use  . Smoking status: Never Smoker  . Smokeless tobacco: Never Used  Substance and Sexual Activity  . Alcohol use: No  . Drug use: No  . Sexual activity: Not on file  Other Topics Concern  . Not on file  Social History Narrative  . Not on file   Social Determinants of Health   Financial Resource Strain: Not on file  Food Insecurity: Not on file  Transportation Needs: Not on file  Physical Activity: Not on file  Stress: Not on file  Social Connections: Not on file  Intimate Partner Violence: Not on file     Family History: History reviewed. No pertinent family history.  Review of Systems:  Review of Systems - General ROS: Negative Psychological ROS: Negative Ophthalmic ROS: Negative ENT  ROS: Negative Hematological and Lymphatic ROS: Negative  Endocrine ROS: Negative Respiratory ROS: Negative Cardiovascular ROS: Negative Gastrointestinal ROS: Negative Genito-Urinary ROS: Negative Musculoskeletal ROS: Negative Neurological ROS: Positive for AMS, denies headaches Dermatological ROS: Negative  Physical Exam: BP (!) 143/89 (BP Location: Right Arm)   Pulse 72   Temp 98.2 F (36.8 C) (Axillary)   Resp (!) 9   Ht 5\' 5"  (1.651 m)   Wt 95.8 kg   SpO2 100%   BMI 35.16 kg/m  Body mass index is 35.16 kg/m. Body surface area is 2.1 meters squared. General appearance: Alert, cooperative, in no acute distress Head: Normocephalic, atraumatic Eyes: Normal, EOM intact  Neurologic exam:  Mental status: alertness: alert, orientation: oriented to person and stated hospital, could not give date, affect: normal Speech: fluent and clear, would not participate in naming, limited spontaneous speech Cranial nerves:  II: Visual fields appear full to movement III/IV/VI: extra-ocular motions intact bilaterally V/VII:no evidence of facial droop or weakness VIII: hearing normal Motor:strength symmetric 4+/5 in upper extremities, would wiggle toes to command but not participate in strength testing in LE Sensory: intact to light touch in all extremities, intact to pain in LE Gait: not tested  Laboratory: Results for orders placed or performed during the hospital encounter of 12/03/20  Resp Panel by RT-PCR (Flu A&B, Covid) Nasopharyngeal Swab   Specimen: Nasopharyngeal Swab; Nasopharyngeal(NP) swabs in vial transport medium  Result Value Ref Range   SARS Coronavirus 2  by RT PCR NEGATIVE NEGATIVE   Influenza A by PCR NEGATIVE NEGATIVE   Influenza B by PCR NEGATIVE NEGATIVE  MRSA PCR Screening   Specimen: Nasal Mucosa; Nasopharyngeal  Result Value Ref Range   MRSA by PCR NEGATIVE NEGATIVE  Lipase, blood  Result Value Ref Range   Lipase 48 11 - 51 U/L  Comprehensive metabolic panel   Result Value Ref Range   Sodium 136 135 - 145 mmol/L   Potassium 3.1 (L) 3.5 - 5.1 mmol/L   Chloride 102 98 - 111 mmol/L   CO2 18 (L) 22 - 32 mmol/L   Glucose, Bld 124 (H) 70 - 99 mg/dL   BUN 29 (H) 8 - 23 mg/dL   Creatinine, Ser 1.02 (H) 0.44 - 1.00 mg/dL   Calcium 9.7 8.9 - 58.5 mg/dL   Total Protein 6.4 (L) 6.5 - 8.1 g/dL   Albumin 2.6 (L) 3.5 - 5.0 g/dL   AST 37 15 - 41 U/L   ALT 37 0 - 44 U/L   Alkaline Phosphatase 366 (H) 38 - 126 U/L   Total Bilirubin 2.2 (H) 0.3 - 1.2 mg/dL   GFR, Estimated 39 (L) >60 mL/min   Anion gap 16 (H) 5 - 15  CBC  Result Value Ref Range   WBC 12.7 (H) 4.0 - 10.5 K/uL   RBC 4.63 3.87 - 5.11 MIL/uL   Hemoglobin 13.1 12.0 - 15.0 g/dL   HCT 27.7 82.4 - 23.5 %   MCV 82.9 80.0 - 100.0 fL   MCH 28.3 26.0 - 34.0 pg   MCHC 34.1 30.0 - 36.0 g/dL   RDW 36.1 (H) 44.3 - 15.4 %   Platelets 268 150 - 400 K/uL   nRBC 0.0 0.0 - 0.2 %  Urinalysis, Complete w Microscopic Urine, Catheterized  Result Value Ref Range   Color, Urine AMBER (A) YELLOW   APPearance CLOUDY (A) CLEAR   Specific Gravity, Urine 1.021 1.005 - 1.030   pH 5.0 5.0 - 8.0   Glucose, UA 50 (A) NEGATIVE mg/dL   Hgb urine dipstick NEGATIVE NEGATIVE   Bilirubin Urine NEGATIVE NEGATIVE   Ketones, ur 20 (A) NEGATIVE mg/dL   Protein, ur 008 (A) NEGATIVE mg/dL   Nitrite NEGATIVE NEGATIVE   Leukocytes,Ua NEGATIVE NEGATIVE   RBC / HPF 0-5 0 - 5 RBC/hpf   WBC, UA 0-5 0 - 5 WBC/hpf   Bacteria, UA NONE SEEN NONE SEEN   Squamous Epithelial / LPF 6-10 0 - 5  Magnesium  Result Value Ref Range   Magnesium 1.7 1.7 - 2.4 mg/dL  Hemoglobin Q7Y  Result Value Ref Range   Hgb A1c MFr Bld 4.6 (L) 4.8 - 5.6 %   Mean Plasma Glucose 85.32 mg/dL  Glucose, capillary  Result Value Ref Range   Glucose-Capillary 108 (H) 70 - 99 mg/dL  Procalcitonin - Baseline  Result Value Ref Range   Procalcitonin 0.17 ng/mL  Glucose, capillary  Result Value Ref Range   Glucose-Capillary 96 70 - 99 mg/dL  Glucose,  capillary  Result Value Ref Range   Glucose-Capillary 103 (H) 70 - 99 mg/dL  Glucose, capillary  Result Value Ref Range   Glucose-Capillary 96 70 - 99 mg/dL  Glucose, capillary  Result Value Ref Range   Glucose-Capillary 95 70 - 99 mg/dL  CBC with Differential/Platelet  Result Value Ref Range   WBC 12.2 (H) 4.0 - 10.5 K/uL   RBC 4.56 3.87 - 5.11 MIL/uL   Hemoglobin 13.1 12.0 - 15.0 g/dL  HCT 37.9 36.0 - 46.0 %   MCV 83.1 80.0 - 100.0 fL   MCH 28.7 26.0 - 34.0 pg   MCHC 34.6 30.0 - 36.0 g/dL   RDW 16.119.1 (H) 09.611.5 - 04.515.5 %   Platelets 235 150 - 400 K/uL   nRBC 0.0 0.0 - 0.2 %   Neutrophils Relative % 57 %   Neutro Abs 6.9 1.7 - 7.7 K/uL   Lymphocytes Relative 25 %   Lymphs Abs 3.1 0.7 - 4.0 K/uL   Monocytes Relative 14 %   Monocytes Absolute 1.7 (H) 0.1 - 1.0 K/uL   Eosinophils Relative 1 %   Eosinophils Absolute 0.2 0.0 - 0.5 K/uL   Basophils Relative 1 %   Basophils Absolute 0.1 0.0 - 0.1 K/uL   Immature Granulocytes 2 %   Abs Immature Granulocytes 0.28 (H) 0.00 - 0.07 K/uL  Magnesium  Result Value Ref Range   Magnesium 1.7 1.7 - 2.4 mg/dL  Phosphorus  Result Value Ref Range   Phosphorus 2.8 2.5 - 4.6 mg/dL  TSH  Result Value Ref Range   TSH 4.688 (H) 0.350 - 4.500 uIU/mL  Glucose, capillary  Result Value Ref Range   Glucose-Capillary 86 70 - 99 mg/dL  Potassium  Result Value Ref Range   Potassium 3.1 (L) 3.5 - 5.1 mmol/L  Glucose, capillary  Result Value Ref Range   Glucose-Capillary 83 70 - 99 mg/dL  Glucose, capillary  Result Value Ref Range   Glucose-Capillary 111 (H) 70 - 99 mg/dL  Glucose, capillary  Result Value Ref Range   Glucose-Capillary 105 (H) 70 - 99 mg/dL  VITAMIN D 25 Hydroxy (Vit-D Deficiency, Fractures)  Result Value Ref Range   Vit D, 25-Hydroxy 37.81 30 - 100 ng/mL  Vitamin B12  Result Value Ref Range   Vitamin B-12 1,905 (H) 180 - 914 pg/mL  Basic metabolic panel  Result Value Ref Range   Sodium 143 135 - 145 mmol/L   Potassium 3.1  (L) 3.5 - 5.1 mmol/L   Chloride 114 (H) 98 - 111 mmol/L   CO2 19 (L) 22 - 32 mmol/L   Glucose, Bld 94 70 - 99 mg/dL   BUN 23 8 - 23 mg/dL   Creatinine, Ser 4.091.21 (H) 0.44 - 1.00 mg/dL   Calcium 8.8 (L) 8.9 - 10.3 mg/dL   GFR, Estimated 49 (L) >60 mL/min   Anion gap 10 5 - 15  CBC  Result Value Ref Range   WBC 9.4 4.0 - 10.5 K/uL   RBC 3.74 (L) 3.87 - 5.11 MIL/uL   Hemoglobin 10.8 (L) 12.0 - 15.0 g/dL   HCT 81.130.8 (L) 91.436.0 - 78.246.0 %   MCV 82.4 80.0 - 100.0 fL   MCH 28.9 26.0 - 34.0 pg   MCHC 35.1 30.0 - 36.0 g/dL   RDW 95.619.2 (H) 21.311.5 - 08.615.5 %   Platelets 269 150 - 400 K/uL   nRBC 0.0 0.0 - 0.2 %  D-dimer, quantitative  Result Value Ref Range   D-Dimer, Quant 1.48 (H) 0.00 - 0.50 ug/mL-FEU  Glucose, capillary  Result Value Ref Range   Glucose-Capillary 112 (H) 70 - 99 mg/dL  Urinalysis, Complete w Microscopic  Result Value Ref Range   Color, Urine AMBER (A) YELLOW   APPearance CLOUDY (A) CLEAR   Specific Gravity, Urine 1.019 1.005 - 1.030   pH 5.0 5.0 - 8.0   Glucose, UA NEGATIVE NEGATIVE mg/dL   Hgb urine dipstick MODERATE (A) NEGATIVE   Bilirubin Urine NEGATIVE  NEGATIVE   Ketones, ur 5 (A) NEGATIVE mg/dL   Protein, ur 30 (A) NEGATIVE mg/dL   Nitrite NEGATIVE NEGATIVE   Leukocytes,Ua SMALL (A) NEGATIVE   RBC / HPF >50 (H) 0 - 5 RBC/hpf   WBC, UA 11-20 0 - 5 WBC/hpf   Bacteria, UA RARE (A) NONE SEEN   Squamous Epithelial / LPF 0-5 0 - 5   Non Squamous Epithelial PRESENT (A) NONE SEEN  Procalcitonin - Baseline  Result Value Ref Range   Procalcitonin <0.10 ng/mL  Hepatic function panel  Result Value Ref Range   Total Protein 4.9 (L) 6.5 - 8.1 g/dL   Albumin 2.0 (L) 3.5 - 5.0 g/dL   AST 23 15 - 41 U/L   ALT 34 0 - 44 U/L   Alkaline Phosphatase 239 (H) 38 - 126 U/L   Total Bilirubin 1.0 0.3 - 1.2 mg/dL   Bilirubin, Direct 0.1 0.0 - 0.2 mg/dL   Indirect Bilirubin 0.9 0.3 - 0.9 mg/dL  Magnesium  Result Value Ref Range   Magnesium 2.2 1.7 - 2.4 mg/dL  Phosphorus   Result Value Ref Range   Phosphorus 1.6 (L) 2.5 - 4.6 mg/dL  APTT  Result Value Ref Range   aPTT 37 (H) 24 - 36 seconds  Protime-INR  Result Value Ref Range   Prothrombin Time 15.8 (H) 11.4 - 15.2 seconds   INR 1.3 (H) 0.8 - 1.2  Glucose, capillary  Result Value Ref Range   Glucose-Capillary 130 (H) 70 - 99 mg/dL  Glucose, capillary  Result Value Ref Range   Glucose-Capillary 137 (H) 70 - 99 mg/dL  Troponin I (High Sensitivity)  Result Value Ref Range   Troponin I (High Sensitivity) 12 <18 ng/L  Troponin I (High Sensitivity)  Result Value Ref Range   Troponin I (High Sensitivity) 9 <18 ng/L   I personally reviewed labs  Imaging: MRI Brain: 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 This is adjacent to aqueduct with mild enlargement of ventricles but no significant loss of sulci or transependymal flow.   Impression/Plan:  Ms Vowell is here with concern for AMS. 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 It dies not appear related to her acute changes and no intervention recommended at this time. We can follow up as outpatient to discuss option going forward once she recovers from her acute illness.    1.  Diagnosis: Pineal mass  2.  Plan - No intervention needed at this time for pineal mass

## 2020-12-06 NOTE — Progress Notes (Signed)
Per Dr. Allena Katz, ok to cancel NG placement and offer patient sips and ice chips until planned PEG tube procedure on Friday.

## 2020-12-06 NOTE — Progress Notes (Signed)
   12/06/20 0927  Assess: MEWS Score  Temp 98.2 F (36.8 C)  BP 98/64  Pulse Rate (!) 144  Resp 17  SpO2 96 %  O2 Device Room Air  Assess: MEWS Score  MEWS Temp 0  MEWS Systolic 1  MEWS Pulse 3  MEWS RR 0  MEWS LOC 0  MEWS Score 4  MEWS Score Color Red  Notify: Rapid Response  Name of Rapid Response RN Notified Charlie, RN  Date Rapid Response Notified 12/06/20  Time Rapid Response Notified 0930  Document  Patient Outcome Transferred/level of care increased  Progress note created (see row info) Yes   Interventions given (IV bolus, IV metoprolol). No change in vital signs. Patient is still a red MEWS. According to new protocol rapid was called. Patient was transferred to ICU and report was given to receiving nurse.

## 2020-12-06 NOTE — Consult Note (Signed)
Consult received. Patient will be seen priority in the morning

## 2020-12-06 NOTE — Procedures (Signed)
Central Venous Catheter Insertion Procedure Note  Chelsea Glass  [id number]  1956-02-28  Date:12/06/20  Time:12:25 PM   Provider Performing:Ashanti Littles D Elvina Sidle   Procedure: Insertion of Non-tunneled Central Venous [id number]) with US guidance (83662)   Indication(s) Medication administration and Difficult access  Consent Risks of the procedure as well as the alternatives and risks of each were explained to the patient and/or caregiver.  Consent for the procedure was obtained and is signed in the bedside chart  Anesthesia Topical only with 1% lidocaine   Timeout Verified patient identification, verified procedure, site/side was marked, verified correct patient position, special equipment/implants available, medications/allergies/relevant history reviewed, required imaging and test results available.  Sterile Technique Maximal sterile technique including full sterile barrier drape, hand hygiene, sterile gown, sterile gloves, mask, hair covering, sterile ultrasound probe cover (if used).  Procedure Description Area of catheter insertion was cleaned with chlorhexidine and draped in sterile fashion.  With real-time ultrasound guidance a central venous catheter was placed into the left internal jugular vein. Nonpulsatile blood flow and easy flushing noted in all ports.  The catheter was sutured in place and sterile dressing applied.  Complications/Tolerance None; patient tolerated the procedure well. Chest X-ray is ordered to verify placement for internal jugular or subclavian cannulation.   Chest x-ray is not ordered for femoral cannulation.  EBL Minimal  Specimen(s) None    Line was secured at the 20 cm mark.  BIOPATCH applied to the insertion site.    Harlon Ditty, AGACNP-BC [facility] Pager: [phone]

## 2020-12-06 NOTE — Progress Notes (Signed)
PHARMACY CONSULT NOTE - FOLLOW UP  Pharmacy Consult for Electrolyte Monitoring and Replacement   Recent Labs: Potassium (mmol/L)  Date Value  12/06/2020 3.1 (L)  12/06/2020 3.1 (L)   Magnesium (mg/dL)  Date Value  16/07/9603 1.7   Calcium (mg/dL)  Date Value  54/06/8118 8.8 (L)   Albumin (g/dL)  Date Value  14/78/2956 2.6 (L)   Phosphorus (mg/dL)  Date Value  21/30/8657 2.8   Sodium (mmol/L)  Date Value  12/06/2020 143     Assessment: 65 year old female transferred to the ICU 3/1. Patient with generalized deconditioning and failure to thrive. Dysphagia limiting safe PO intake, plan for G-tube placement. Pharmacy consult for electrolyte replacement.  Goal of Therapy:  Electrolytes WNL  Plan:  Order for potassium 10 mEq IV x 6 runs. No further replacement at this time. Will follow up with morning labs.  Pricilla Riffle ,PharmD Clinical Pharmacist 12/06/2020 1:34 PM

## 2020-12-06 NOTE — Progress Notes (Signed)
  Chaplain On-Call responded to a Rapid Response notification for the patient.  Learned from the Unit Secretary that the patient has been moved to the ICU bed 4.  Chaplain will seek to follow up as the patient becomes stabilized.  Chaplain Evelena Peat M.Div., [facility]

## 2020-12-07 DIAGNOSIS — R627 Adult failure to thrive: Secondary | ICD-10-CM | POA: Diagnosis not present

## 2020-12-07 DIAGNOSIS — N179 Acute kidney failure, unspecified: Secondary | ICD-10-CM | POA: Diagnosis not present

## 2020-12-07 DIAGNOSIS — F32A Depression, unspecified: Secondary | ICD-10-CM | POA: Diagnosis not present

## 2020-12-07 DIAGNOSIS — R400 Somnolence: Secondary | ICD-10-CM | POA: Diagnosis not present

## 2020-12-07 DIAGNOSIS — E876 Hypokalemia: Secondary | ICD-10-CM

## 2020-12-07 DIAGNOSIS — Z7189 Other specified counseling: Secondary | ICD-10-CM | POA: Diagnosis not present

## 2020-12-07 LAB — PHOSPHORUS
Phosphorus: 2.4 mg/dL — ABNORMAL LOW (ref 2.5–4.6)
Phosphorus: 1.8 mg/dL — ABNORMAL LOW (ref 2.5–4.6)

## 2020-12-07 LAB — CBC
HCT: 28 % — ABNORMAL LOW (ref 36.0–46.0)
Hemoglobin: 9.6 g/dL — ABNORMAL LOW (ref 12.0–15.0)
MCH: 28.8 pg (ref 26.0–34.0)
MCHC: 34.3 g/dL (ref 30.0–36.0)
MCV: 84.1 fL (ref 80.0–100.0)
Platelets: 247 10*3/uL (ref 150–400)
RBC: 3.33 MIL/uL — ABNORMAL LOW (ref 3.87–5.11)
RDW: 19 % — ABNORMAL HIGH (ref 11.5–15.5)
WBC: 10.8 10*3/uL — ABNORMAL HIGH (ref 4.0–10.5)
nRBC: 0 % (ref 0.0–0.2)

## 2020-12-07 LAB — COMPREHENSIVE METABOLIC PANEL
ALT: 34 U/L (ref 0–44)
AST: 25 U/L (ref 15–41)
Albumin: 2 g/dL — ABNORMAL LOW (ref 3.5–5.0)
Alkaline Phosphatase: 250 U/L — ABNORMAL HIGH (ref 38–126)
Anion gap: 7 (ref 5–15)
BUN: 20 mg/dL (ref 8–23)
CO2: 19 mmol/L — ABNORMAL LOW (ref 22–32)
Calcium: 8.5 mg/dL — ABNORMAL LOW (ref 8.9–10.3)
Chloride: 113 mmol/L — ABNORMAL HIGH (ref 98–111)
Creatinine, Ser: 1.05 mg/dL — ABNORMAL HIGH (ref 0.44–1.00)
GFR, Estimated: 59 mL/min — ABNORMAL LOW (ref >60)
Glucose, Bld: 161 mg/dL — ABNORMAL HIGH (ref 70–99)
Potassium: 3.9 mmol/L (ref 3.5–5.1)
Sodium: 139 mmol/L (ref 135–145)
Total Bilirubin: 0.5 mg/dL (ref 0.3–1.2)
Total Protein: 4.9 g/dL — ABNORMAL LOW (ref 6.5–8.1)

## 2020-12-07 LAB — GLUCOSE, CAPILLARY
Glucose-Capillary: 140 mg/dL — ABNORMAL HIGH (ref 70–99)
Glucose-Capillary: 141 mg/dL — ABNORMAL HIGH (ref 70–99)
Glucose-Capillary: 150 mg/dL — ABNORMAL HIGH (ref 70–99)
Glucose-Capillary: 161 mg/dL — ABNORMAL HIGH (ref 70–99)
Glucose-Capillary: 174 mg/dL — ABNORMAL HIGH (ref 70–99)
Glucose-Capillary: 182 mg/dL — ABNORMAL HIGH (ref 70–99)

## 2020-12-07 LAB — HEPARIN LEVEL (UNFRACTIONATED)
Heparin Unfractionated: 1.92 IU/mL — ABNORMAL HIGH (ref 0.30–0.70)
Heparin Unfractionated: 1.41 IU/mL — ABNORMAL HIGH (ref 0.30–0.70)
Heparin Unfractionated: 1.02 IU/mL — ABNORMAL HIGH (ref 0.30–0.70)

## 2020-12-07 LAB — MAGNESIUM
Magnesium: 1.9 mg/dL (ref 1.7–2.4)
Magnesium: 1.7 mg/dL (ref 1.7–2.4)

## 2020-12-07 LAB — TROPONIN I (HIGH SENSITIVITY): Troponin I (High Sensitivity): 11 ng/L (ref <18)

## 2020-12-07 MED ORDER — SODIUM CHLORIDE 0.9 % IV BOLUS
250.0000 mL | Freq: Once | INTRAVENOUS | Status: AC
  Administered 2020-12-07: 250 mL via INTRAVENOUS

## 2020-12-07 MED ORDER — HEPARIN (PORCINE) 25000 UT/250ML-% IV SOLN
800.0000 [IU]/h | INTRAVENOUS | Status: DC
  Administered 2020-12-07: 800 [IU]/h via INTRAVENOUS
  Filled 2020-12-07: qty 250

## 2020-12-07 MED ORDER — KETOROLAC TROMETHAMINE 15 MG/ML IJ SOLN: 7.5000 mg | Freq: Once | INTRAMUSCULAR | Status: AC

## 2020-12-07 MED ORDER — DICLOFENAC SODIUM 1 % EX GEL
2.0000 g | Freq: Four times a day (QID) | CUTANEOUS | Status: DC
  Administered 2020-12-07 – 2020-12-16 (×29): 2 g via TOPICAL
  Filled 2020-12-07 (×2): qty 100

## 2020-12-07 MED ORDER — ACETAMINOPHEN 325 MG PO TABS: 650.0000 mg | ORAL_TABLET | Freq: Four times a day (QID) | ORAL | Status: DC | PRN

## 2020-12-07 MED ORDER — KETOROLAC TROMETHAMINE 15 MG/ML IJ SOLN
15.0000 mg | Freq: Four times a day (QID) | INTRAMUSCULAR | Status: DC | PRN
  Administered 2020-12-07 – 2020-12-08 (×3): 15 mg via INTRAVENOUS
  Filled 2020-12-07 (×3): qty 1

## 2020-12-07 MED ORDER — AMIODARONE HCL IN DEXTROSE 360-4.14 MG/200ML-% IV SOLN
30.0000 mg/h | INTRAVENOUS | Status: DC
  Administered 2020-12-07 – 2020-12-09 (×4): 30 mg/h via INTRAVENOUS
  Filled 2020-12-07 (×3): qty 200

## 2020-12-07 MED ORDER — LACTATED RINGERS IV BOLUS
1000.0000 mL | Freq: Once | INTRAVENOUS | Status: AC
  Administered 2020-12-07: 1000 mL via INTRAVENOUS

## 2020-12-07 MED ORDER — AMIODARONE HCL 200 MG PO TABS: 200.0000 mg | ORAL_TABLET | Freq: Two times a day (BID) | ORAL | Status: DC

## 2020-12-07 MED ORDER — HEPARIN (PORCINE) 25000 UT/250ML-% IV SOLN
450.0000 [IU]/h | INTRAVENOUS | Status: DC
  Administered 2020-12-07: 450 [IU]/h via INTRAVENOUS

## 2020-12-07 MED ORDER — HEPARIN (PORCINE) 25000 UT/250ML-% IV SOLN
1000.0000 [IU]/h | INTRAVENOUS | Status: DC
  Administered 2020-12-07: 1000 [IU]/h via INTRAVENOUS

## 2020-12-07 MED ORDER — OLANZAPINE 5 MG PO TBDP
5.0000 mg | ORAL_TABLET | Freq: Every day | ORAL | Status: DC
Start: 2020-12-07 — End: 2020-12-13
  Administered 2020-12-08 – 2020-12-13 (×6): 5 mg via ORAL
  Filled 2020-12-07 (×7): qty 1

## 2020-12-07 MED ORDER — FENTANYL CITRATE (PF) 100 MCG/2ML IJ SOLN
12.5000 ug | Freq: Four times a day (QID) | INTRAMUSCULAR | Status: DC | PRN
  Administered 2020-12-07 – 2020-12-08 (×3): 12.5 ug via INTRAVENOUS
  Filled 2020-12-07 (×3): qty 2

## 2020-12-07 MED ORDER — DEXTROSE IN LACTATED RINGERS 5 % IV SOLN: INTRAVENOUS | Status: DC

## 2020-12-07 MED ORDER — KETOROLAC TROMETHAMINE 15 MG/ML IJ SOLN
INTRAMUSCULAR | Status: AC
  Administered 2020-12-07: 7.5 mg via INTRAVENOUS
  Filled 2020-12-07: qty 1

## 2020-12-07 MED ORDER — MIRTAZAPINE 15 MG PO TBDP
30.0000 mg | ORAL_TABLET | Freq: Every day | ORAL | Status: DC
  Administered 2020-12-08: 30 mg via ORAL
  Filled 2020-12-07 (×3): qty 2

## 2020-12-07 NOTE — Progress Notes (Signed)
Daily Progress Note   Patient Name: Chelsea Glass       Date: 12/07/2020 DOB: 1956-01-08  Age: 65 y.o. MRN#: [id number] Attending Physician: Lynn Ito, MD Primary Care Physician: [facility] Admit Date: 12/03/2020  Reason for Consultation/Follow-up: Establishing goals of care  Subjective: Patient is resting in bed moaning. She complains of knee pain. Daughter is at bedside. She states she her mother has arthritis in her left knee and has used OTC mediation for this.   Daughter tells me she wants all care possible for her mother. She states there are plans in place for a PEG later this week. Daughter tells me her mother does not want to eat because since having covid, things taste bitter. We discussed her home PT regimen. She states PT works with her but she does not get out of bed and has no splints. 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 Discussed that she would need devices to prevent foot drop, elbow, and hand contractures. Discussed placing pillows to keep her head from chronically being turned to the right.   RN entered to medicate patient, and pain relieved. Daughter left bedside.   Patient awakens when I stepped to her side, but kept falling asleep. She attempted to speak , but I was unable to understand what she was saying.       Length of Stay: 4  Current Medications: Scheduled Meds:  . Chlorhexidine Gluconate Cloth  6 each Topical Daily  . diclofenac Sodium  2 g Topical QID  . digoxin  0.25 mg Intravenous Once  . insulin aspart  0-9 Units Subcutaneous Q4H  . metoprolol succinate  25 mg Oral Daily  . mirtazapine  30 mg Oral QHS  . OLANZapine zydis  5 mg Oral QHS    Continuous Infusions: . amiodarone 30 mg/hr (12/07/20  1317)  . dextrose 5% lactated ringers 125 mL/hr at 12/07/20 1316  . heparin 800 Units/hr (12/07/20 1318)    PRN Meds: acetaminophen **OR** acetaminophen, acetaminophen, fentaNYL (SUBLIMAZE) injection, ketorolac, magnesium hydroxide, ondansetron **OR** ondansetron (ZOFRAN) IV  Physical Exam Constitutional:      Comments: Opens eyes intermittently. Moans with pain.              Vital Signs: BP 105/85   Pulse 86  Temp 98.7 F (37.1 C) (Axillary)   Resp 17   Ht 5\' 5"  (1.651 m)   Wt 102 kg   SpO2 100%   BMI 37.42 kg/m  SpO2: SpO2: 100 % O2 Device: O2 Device: Room Air O2 Flow Rate:    Intake/output summary:   Intake/Output Summary (Last 24 hours) at 12/07/2020 1435 Last data filed at 12/07/2020 1132 Gross per 24 hour  Intake 2512.54 ml  Output 165 ml  Net 2347.54 ml   LBM: Last BM Date: 12/07/20 Baseline Weight: Weight: 95.8 kg Most recent weight: Weight: 102 kg        Patient Active Problem List   Diagnosis Date Noted  . Altered mental status   . Tachycardia   . Hypotension   . Weight loss   . Adult failure to thrive   . Hypokalemia   . Depression   . AKI (acute kidney injury) (HCC) 12/03/2020  . Postmenopausal bleeding   . Hypertension   . Acute respiratory failure with hypoxia (HCC) 06/13/2020  . Obesity, Class III, BMI 40-49.9 (morbid obesity) (HCC) 06/13/2020  . Severe sepsis (HCC) 06/13/2020  . New onset atrial fibrillation (HCC) 06/13/2020  . Pneumonia due to COVID-19 virus 06/12/2020    Palliative Care Assessment & Plan    Recommendations/Plan: Full code/full scope.    Code Status:    Code Status Orders  (From admission, onward)         Start     Ordered   12/03/20 2028  Full code  Continuous        12/03/20 2041        Code Status History    Date Active Date Inactive Code Status Order ID Comments User Context   06/12/2020 1804 06/27/2020 2002 Full Code 2003  [id number], MD ED   Advance Care Planning Activity        Care  plan was discussed with RN  Thank you for allowing the Palliative Medicine Team to assist in the care of this patient.   Total Time 25 min Prolonged Time Billed  no      Greater than 50%  of this time was spent counseling and coordinating care related to the above assessment and plan.  Lurene Shadow, NP  Please contact Palliative Medicine Team phone at [phone] for questions and concerns.

## 2020-12-07 NOTE — Consult Note (Signed)
[facility] Face-to-Face Psychiatry Consult   Reason for Consult: Consult for 65 year old woman in the intensive care unit with multiple medical problems.  Concerned about depression Referring Physician:  Amery Patient Identification: Chelsea Glass MRN:  [id number] Principal Diagnosis: Depression Diagnosis:  Principal Problem:   Depression Active Problems:   AKI (acute kidney injury) (HCC)   Adult failure to thrive   Hypokalemia   Weight loss   Altered mental status   Tachycardia   Hypotension   Total Time spent with patient: 1 hour  Subjective:   Chelsea Glass is a 65 y.o. female patient admitted with "pain".  HPI: Patient seen chart reviewed.  Spoke with the patient's daughter who was at bedside.  41 year old woman had COVID infection about a month ago and since that time has had a dramatic change in her behavior and mental status.  Patient has remained withdrawn flat almost uncommunicative.  Uncooperative with eating and taking medicine.  Complaining more of pain.  According to daughter this is a huge change from her prior baseline which was verbal and fairly functional.  Patient is prescribed antidepressant medication but has not been getting any of her medicine the last couple days because she is being kept n.p.o.  Reports were that she was having trouble swallowing and pending swallowing study and decision about feeding tube they are not giving her anything to swallow.  On interview the patient was awake but initially just barely responsive.  She was moaning quietly.  Daughter told me that the patient was having pain in her knee which I confirmed with the patient.  After a while of talking the pain medication she had recently been given seem to take effect and she calm down a little.  Patient not able to answer more detailed questions.  Denies specific suicidal ideation but admits to feeling depressed.  Past Psychiatric History: No prior depression hospitalization no prior suicide  attempts no history of violence psychosis or bipolar disorder.  Currently looks like she has been prescribed both Cymbalta and mirtazapine but has not been getting them for at least the last few days.  Risk to Self:   Risk to Others:   Prior Inpatient Therapy:   Prior Outpatient Therapy:    Past Medical History:  Past Medical History:  Diagnosis Date  . Arthritis   . Diabetes mellitus without complication (HCC)   . Hypertension     Past Surgical History:  Procedure Laterality Date  . TUBAL LIGATION    . UTERINE FIBROID SURGERY     Family History: History reviewed. No pertinent family history. Family Psychiatric  History: None reported Social History:  Social History   Substance and Sexual Activity  Alcohol Use No     Social History   Substance and Sexual Activity  Drug Use No    Social History   Socioeconomic History  . Marital status: Widowed    Spouse name: Not on file  . Number of children: Not on file  . Years of education: Not on file  . Highest education level: Not on file  Occupational History  . Not on file  Tobacco Use  . Smoking status: Never Smoker  . Smokeless tobacco: Never Used  Substance and Sexual Activity  . Alcohol use: No  . Drug use: No  . Sexual activity: Not on file  Other Topics Concern  . Not on file  Social History Narrative  . Not on file   Social Determinants of Health   Financial Resource Strain: Not on  file  Food Insecurity: Not on file  Transportation Needs: Not on file  Physical Activity: Not on file  Stress: Not on file  Social Connections: Not on file   Additional Social History:    Allergies:   Allergies  Allergen Reactions  . Lisinopril Swelling    Labs:  Results for orders placed or performed during the hospital encounter of 12/03/20 (from the past 48 hour(s))  Glucose, capillary     Status: Abnormal   Collection Time: 12/05/20  8:53 PM  Result Value Ref Range   Glucose-Capillary 105 (H) 70 - 99 mg/dL     Comment: Glucose reference range applies only to samples taken after fasting for at least 8 hours.  Potassium     Status: Abnormal   Collection Time: 12/06/20  5:05 AM  Result Value Ref Range   Potassium 3.1 (L) 3.5 - 5.1 mmol/L    Comment: Performed at [facility], [address] [place], [place] [address]  VITAMIN D 25 Hydroxy (Vit-D Deficiency, Fractures)     Status: None   Collection Time: 12/06/20  5:05 AM  Result Value Ref Range   Vit D, 25-Hydroxy 37.81 30 - 100 ng/mL    Comment: (NOTE) Vitamin D deficiency has been defined by the Institute of Medicine  and an Endocrine Society practice guideline as a level of serum 25-OH  vitamin D less than 20 ng/mL (1,2). The Endocrine Society went on to  further define vitamin D insufficiency as a level between 21 and 29  ng/mL (2).  1. IOM (Institute of Medicine). 2010. Dietary reference intakes for  calcium and D. [place]: [employer]. 2. Holick MF, Binkley [place], Bischoff-Ferrari HA, et al. Evaluation,  treatment, and prevention of vitamin D deficiency: an Endocrine  Society clinical practice guideline, JCEM. 2011 Jul; 96(7): 1911-30.  Performed at [facility], [address], [place], [place] [address]   Vitamin B12     Status: Abnormal   Collection Time: 12/06/20  5:05 AM  Result Value Ref Range   Vitamin B-12 1,905 (H) 180 - 914 pg/mL    Comment: (NOTE) This assay is not validated for testing neonatal or myeloproliferative syndrome specimens for Vitamin B12 levels. Performed at [facility], [address], [place], [place] [address]   Basic metabolic panel     Status: Abnormal   Collection Time: 12/06/20  5:05 AM  Result Value Ref Range   Sodium 143 135 - 145 mmol/L   Potassium 3.1 (L) 3.5 - 5.1 mmol/L   Chloride 114 (H) 98 - 111 mmol/L   CO2 19 (L) 22 - 32 mmol/L   Glucose, Bld 94 70 - 99 mg/dL    Comment: Glucose reference range applies only to samples taken after fasting for  at least 8 hours.   BUN 23 8 - 23 mg/dL   Creatinine, Ser 9.14 (H) 0.44 - 1.00 mg/dL   Calcium 8.8 (L) 8.9 - 10.3 mg/dL   GFR, Estimated 49 (L) >60 mL/min    Comment: (NOTE) Calculated using the CKD-EPI Creatinine Equation (2021)    Anion gap 10 5 - 15    Comment: Performed at [facility], [address] [place], [place] [address]  CBC     Status: Abnormal   Collection Time: 12/06/20  7:54 AM  Result Value Ref Range   WBC 9.4 4.0 - 10.5 K/uL   RBC 3.74 (L) 3.87 - 5.11 MIL/uL   Hemoglobin 10.8 (L) 12.0 -  15.0 g/dL   HCT 93.7 (L) 90.2 - 40.9 %   MCV 82.4 80.0 - 100.0 fL   MCH 28.9 26.0 - 34.0 pg   MCHC 35.1 30.0 - 36.0 g/dL   RDW 73.5 (H) 32.9 - 92.4 %   Platelets 269 150 - 400 K/uL   nRBC 0.0 0.0 - 0.2 %    Comment: Performed at [facility], [address] [place], [place] [address]  D-dimer, quantitative     Status: Abnormal   Collection Time: 12/06/20  7:54 AM  Result Value Ref Range   D-Dimer, Quant 1.48 (H) 0.00 - 0.50 ug/mL-FEU    Comment: (NOTE) At the manufacturer cut-off value of 0.5 g/mL FEU, this assay has a negative predictive value of 95-100%.This assay is intended for use in conjunction with a clinical pretest probability (PTP) assessment model to exclude pulmonary embolism (PE) and deep venous thrombosis (DVT) in outpatients suspected of PE or DVT. Results should be correlated with clinical presentation. Performed at [facility], [address] [place], [place] [address]   Glucose, capillary     Status: Abnormal   Collection Time: 12/06/20 10:08 AM  Result Value Ref Range   Glucose-Capillary 112 (H) 70 - 99 mg/dL    Comment: Glucose reference range applies only to samples taken after fasting for at least 8 hours.  CULTURE, BLOOD (ROUTINE X 2) w Reflex to ID Panel     Status: None (Preliminary result)   Collection Time: 12/06/20 10:55 AM   Specimen: BLOOD  Result Value Ref Range   Specimen Description BLOOD LEFT WRIST     Special Requests      BOTTLES DRAWN AEROBIC ONLY Blood Culture adequate volume   Culture      NO GROWTH < 24 HOURS Performed at [facility], [address] [place], [place] [address]    Report Status PENDING   CULTURE, BLOOD (ROUTINE X 2) w Reflex to ID Panel     Status: None (Preliminary result)   Collection Time: 12/06/20 11:05 AM   Specimen: BLOOD  Result Value Ref Range   Specimen Description BLOOD RIGHT WRIST    Special Requests      BOTTLES DRAWN AEROBIC ONLY Blood Culture results may not be optimal due to an inadequate volume of blood received in culture bottles   Culture      NO GROWTH < 24 HOURS Performed at [facility], [address] [place], [place] [address]    Report Status PENDING   Urinalysis, Complete w Microscopic     Status: Abnormal   Collection Time: 12/06/20  1:18 PM  Result Value Ref Range   Color, Urine AMBER (A) YELLOW    Comment: BIOCHEMICALS MAY BE AFFECTED BY COLOR   APPearance CLOUDY (A) CLEAR   Specific Gravity, Urine 1.019 1.005 - 1.030   pH 5.0 5.0 - 8.0   Glucose, UA NEGATIVE NEGATIVE mg/dL   Hgb urine dipstick MODERATE (A) NEGATIVE   Bilirubin Urine NEGATIVE NEGATIVE   Ketones, ur 5 (A) NEGATIVE mg/dL   Protein, ur 30 (A) NEGATIVE mg/dL   Nitrite NEGATIVE NEGATIVE   Leukocytes,Ua SMALL (A) NEGATIVE   RBC / HPF >50 (H) 0 - 5 RBC/hpf   WBC, UA 11-20 0 - 5 WBC/hpf   Bacteria, UA RARE (A) NONE SEEN   Squamous Epithelial / LPF 0-5 0 - 5   Non Squamous Epithelial PRESENT (A) NONE SEEN    Comment: Performed at [facility], [address], [place], [place] [address]  MRSA PCR Screening     Status: None   Collection Time: 12/06/20  1:18 PM   Specimen: Nasal Mucosa; Nasopharyngeal  Result Value Ref Range   MRSA by PCR NEGATIVE NEGATIVE    Comment:        The GeneXpert MRSA Assay (FDA approved for NASAL specimens only), is one component of a comprehensive MRSA colonization surveillance program. It is  not intended to diagnose MRSA infection nor to guide or monitor treatment for MRSA infections. Performed at [facility], [address] [place], [place] [address]   Glucose, capillary     Status: Abnormal   Collection Time: 12/06/20  3:16 PM  Result Value Ref Range   Glucose-Capillary 130 (H) 70 - 99 mg/dL    Comment: Glucose reference range applies only to samples taken after fasting for at least 8 hours.  Procalcitonin - Baseline     Status: None   Collection Time: 12/06/20  4:47 PM  Result Value Ref Range   Procalcitonin <0.10 ng/mL    Comment:        Interpretation: PCT (Procalcitonin) <= 0.5 ng/mL: Systemic infection (sepsis) is not likely. Local bacterial infection is possible. (NOTE)       Sepsis PCT Algorithm           Lower Respiratory Tract                                      Infection PCT Algorithm    ----------------------------     ----------------------------         PCT < 0.25 ng/mL                PCT < 0.10 ng/mL          Strongly encourage             Strongly discourage   discontinuation of antibiotics    initiation of antibiotics    ----------------------------     -----------------------------       PCT 0.25 - 0.50 ng/mL            PCT 0.10 - 0.25 ng/mL               OR       >80% decrease in PCT            Discourage initiation of                                            antibiotics      Encourage discontinuation           of antibiotics    ----------------------------     -----------------------------         PCT >= 0.50 ng/mL              PCT 0.26 - 0.50 ng/mL               AND        <80% decrease in PCT             Encourage initiation of  antibiotics       Encourage continuation           of antibiotics    ----------------------------     -----------------------------        PCT >= 0.50 ng/mL                  PCT > 0.50 ng/mL               AND         increase in PCT                   Strongly encourage                                      initiation of antibiotics    Strongly encourage escalation           of antibiotics                                     -----------------------------                                           PCT <= 0.25 ng/mL                                                 OR                                        > 80% decrease in PCT                                      Discontinue / Do not initiate                                             antibiotics  Performed at [facility], [address], [place], [place] [address]   Troponin I (High Sensitivity)     Status: None   Collection Time: 12/06/20  4:47 PM  Result Value Ref Range   Troponin I (High Sensitivity) 9 <18 ng/L    Comment: (NOTE) Elevated high sensitivity troponin I (hsTnI) values and significant  changes across serial measurements may suggest ACS but many other  chronic and acute conditions are known to elevate hsTnI results.  Refer to the "Links" section for chest pain algorithms and additional  guidance. Performed at [facility], [address] [place], [place] [address]   Hepatic function panel     Status: Abnormal   Collection Time: 12/06/20  4:47 PM  Result Value Ref Range   Total Protein 4.9 (L) 6.5 - 8.1 g/dL   Albumin 2.0 (L) 3.5 - 5.0 g/dL   AST 23 15 - 41 U/L   ALT 34 0 - 44 U/L   Alkaline  Phosphatase 239 (H) 38 - 126 U/L   Total Bilirubin 1.0 0.3 - 1.2 mg/dL   Bilirubin, Direct 0.1 0.0 - 0.2 mg/dL   Indirect Bilirubin 0.9 0.3 - 0.9 mg/dL    Comment: Performed at [facility], [address] [place], [place] [address]  Magnesium     Status: None   Collection Time: 12/06/20  4:47 PM  Result Value Ref Range   Magnesium 2.2 1.7 - 2.4 mg/dL    Comment: Performed at [facility], [address] [place], [place] [address]  Phosphorus     Status: Abnormal   Collection Time: 12/06/20  4:47 PM  Result Value Ref Range    Phosphorus 1.6 (L) 2.5 - 4.6 mg/dL    Comment: Performed at [facility], [address] [place], [place] [address]  APTT     Status: Abnormal   Collection Time: 12/06/20  4:47 PM  Result Value Ref Range   aPTT 37 (H) 24 - 36 seconds    Comment:        IF BASELINE aPTT IS ELEVATED, SUGGEST PATIENT RISK ASSESSMENT BE USED TO DETERMINE APPROPRIATE ANTICOAGULANT THERAPY. Performed at [facility], [address] [place], [place] [address]   Protime-INR     Status: Abnormal   Collection Time: 12/06/20  4:47 PM  Result Value Ref Range   Prothrombin Time 15.8 (H) 11.4 - 15.2 seconds   INR 1.3 (H) 0.8 - 1.2    Comment: (NOTE) INR goal varies based on device and disease states. Performed at [facility], [address] [place], [place] [address]   Glucose, capillary     Status: Abnormal   Collection Time: 12/06/20  7:39 PM  Result Value Ref Range   Glucose-Capillary 137 (H) 70 - 99 mg/dL    Comment: Glucose reference range applies only to samples taken after fasting for at least 8 hours.  Heparin level (unfractionated)     Status: Abnormal   Collection Time: 12/06/20 11:25 PM  Result Value Ref Range   Heparin Unfractionated 1.92 (H) 0.30 - 0.70 IU/mL    Comment: RESULTS CONFIRMED BY MANUAL DILUTION (NOTE) If heparin results are below expected values, and patient dosage has  been confirmed, suggest follow up testing of antithrombin III levels. Performed at [facility], [address] [place], [place] [address]   Glucose, capillary     Status: Abnormal   Collection Time: 12/06/20 11:35 PM  Result Value Ref Range   Glucose-Capillary 145 (H) 70 - 99 mg/dL    Comment: Glucose reference range applies only to samples taken after fasting for at least 8 hours.  Glucose, capillary     Status: Abnormal   Collection Time: 12/07/20  4:05 AM  Result Value Ref Range   Glucose-Capillary 150 (H) 70 - 99 mg/dL    Comment: Glucose reference range  applies only to samples taken after fasting for at least 8 hours.  Comprehensive metabolic panel     Status: Abnormal   Collection Time: 12/07/20  4:37 AM  Result Value Ref Range   Sodium 139 135 - 145 mmol/L   Potassium 3.9 3.5 - 5.1 mmol/L   Chloride 113 (H) 98 - 111 mmol/L   CO2 19 (L) 22 - 32 mmol/L   Glucose, Bld 161 (H) 70 - 99 mg/dL    Comment: Glucose reference range applies only to samples taken after fasting for at least 8 hours.   BUN 20 8 - 23 mg/dL  Creatinine, Ser 1.05 (H) 0.44 - 1.00 mg/dL   Calcium 8.5 (L) 8.9 - 10.3 mg/dL   Total Protein 4.9 (L) 6.5 - 8.1 g/dL   Albumin 2.0 (L) 3.5 - 5.0 g/dL   AST 25 15 - 41 U/L   ALT 34 0 - 44 U/L   Alkaline Phosphatase 250 (H) 38 - 126 U/L   Total Bilirubin 0.5 0.3 - 1.2 mg/dL   GFR, Estimated 59 (L) >60 mL/min    Comment: (NOTE) Calculated using the CKD-EPI Creatinine Equation (2021)    Anion gap 7 5 - 15    Comment: Performed at [facility], [address] [place], [place] [address]  Magnesium     Status: None   Collection Time: 12/07/20  4:37 AM  Result Value Ref Range   Magnesium 1.9 1.7 - 2.4 mg/dL    Comment: Performed at [facility], [address] [place], [place] [address]  Phosphorus     Status: Abnormal   Collection Time: 12/07/20  4:37 AM  Result Value Ref Range   Phosphorus 2.4 (L) 2.5 - 4.6 mg/dL    Comment: Performed at [facility], [address] [place], [place] [address]  Troponin I (High Sensitivity)     Status: None   Collection Time: 12/07/20  4:37 AM  Result Value Ref Range   Troponin I (High Sensitivity) 11 <18 ng/L    Comment: (NOTE) Elevated high sensitivity troponin I (hsTnI) values and significant  changes across serial measurements may suggest ACS but many other  chronic and acute conditions are known to elevate hsTnI results.  Refer to the "Links" section for chest pain algorithms and additional  guidance. Performed at [facility], [address] [place], [place] [address]   Glucose, capillary     Status: Abnormal   Collection Time: 12/07/20  7:25 AM  Result Value Ref Range   Glucose-Capillary 140 (H) 70 - 99 mg/dL    Comment: Glucose reference range applies only to samples taken after fasting for at least 8 hours.  Heparin level (unfractionated)     Status: Abnormal   Collection Time: 12/07/20  7:30 AM  Result Value Ref Range   Heparin Unfractionated 1.41 (H) 0.30 - 0.70 IU/mL    Comment: (NOTE) If heparin results are below expected values, and patient dosage has  been confirmed, suggest follow up testing of antithrombin III levels. Performed at [facility], [address] [place], [place] [address]   CBC     Status: Abnormal   Collection Time: 12/07/20  7:30 AM  Result Value Ref Range   WBC 10.8 (H) 4.0 - 10.5 K/uL   RBC 3.33 (L) 3.87 - 5.11 MIL/uL   Hemoglobin 9.6 (L) 12.0 - 15.0 g/dL   HCT 41.3 (L) 24.4 - 01.0 %   MCV 84.1 80.0 - 100.0 fL   MCH 28.8 26.0 - 34.0 pg   MCHC 34.3 30.0 - 36.0 g/dL   RDW 27.2 (H) 53.6 - 64.4 %   Platelets 247 150 - 400 K/uL   nRBC 0.0 0.0 - 0.2 %    Comment: Performed at [facility], [address] [place], [place] [address]  Glucose, capillary     Status: Abnormal   Collection Time: 12/07/20 11:30 AM  Result Value Ref Range   Glucose-Capillary 161 (H) 70 - 99 mg/dL    Comment: Glucose reference range applies only to samples taken after fasting for at least 8 hours.  Glucose,  capillary     Status: Abnormal   Collection Time: 12/07/20  4:16 PM  Result Value Ref Range   Glucose-Capillary 141 (H) 70 - 99 mg/dL    Comment: Glucose reference range applies only to samples taken after fasting for at least 8 hours.    Current Facility-Administered Medications  Medication Dose Route Frequency Provider Last Rate Last Admin  . acetaminophen (TYLENOL) tablet 650 mg  650 mg Oral Q6H PRN Mansy, Jan A, MD       Or  . acetaminophen (TYLENOL) suppository 650 mg  650 mg  Rectal Q6H PRN Mansy, Vernetta Honey, MD   650 mg at 12/07/20 0454  . acetaminophen (TYLENOL) tablet 650 mg  650 mg Oral Q6H PRN Lynn Ito, MD      . amiodarone (NEXTERONE PREMIX) 360-4.14 MG/200ML-% (1.8 mg/mL) IV infusion  30 mg/hr Intravenous Continuous Maryelizabeth Kaufmann, NP      . Chlorhexidine Gluconate Cloth 2 % PADS 6 each  6 each Topical Daily Enedina Finner, MD   6 each at 12/07/20 [phone]  . dextrose 5 % in lactated ringers infusion   Intravenous Continuous Manuela Schwartz, NP 125 mL/hr at 12/07/20 1316 New Bag at 12/07/20 1316  . diclofenac Sodium (VOLTAREN) 1 % topical gel 2 g  2 g Topical QID Harlon Ditty D, NP   2 g at 12/07/20 1411  . fentaNYL (SUBLIMAZE) injection 12.5 mcg  12.5 mcg Intravenous Q6H PRN Mei Suits T, MD   12.5 mcg at 12/07/20 1132  . heparin ADULT infusion 100 units/mL (25000 units/236mL)  800 Units/hr Intravenous Continuous Lynn Ito, MD 8 mL/hr at 12/07/20 1318 800 Units/hr at 12/07/20 1318  . insulin aspart (novoLOG) injection 0-9 Units  0-9 Units Subcutaneous Q4H Harlon Ditty D, NP   1 Units at 12/07/20 1644  . ketorolac (TORADOL) 15 MG/ML injection 15 mg  15 mg Intravenous Q6H PRN Lynn Ito, MD   15 mg at 12/07/20 1409  . magnesium hydroxide (MILK OF MAGNESIA) suspension 30 mL  30 mL Oral Daily PRN Mansy, Jan A, MD      . metoprolol succinate (TOPROL-XL) 24 hr tablet 25 mg  25 mg Oral Daily Enedina Finner, MD      . mirtazapine (REMERON SOL-TAB) disintegrating tablet 30 mg  30 mg Oral QHS Suraj Ramdass T, MD      . OLANZapine zydis (ZYPREXA) disintegrating tablet 5 mg  5 mg Oral QHS Lurine Imel T, MD      . ondansetron [facility]) tablet 4 mg  4 mg Oral Q6H PRN Mansy, Jan A, MD       Or  . ondansetron (ZOFRAN) injection 4 mg  4 mg Intravenous Q6H PRN Mansy, Vernetta Honey, MD        Musculoskeletal: Strength & Muscle Tone: decreased Gait & Station: unable to stand Patient leans: N/A  Psychiatric Specialty Exam: Physical Exam Vitals and nursing note reviewed.   Constitutional:      General: She is in acute distress.     Appearance: She is well-developed and well-nourished.  HENT:     Head: Normocephalic and atraumatic.  Eyes:     Conjunctiva/sclera: Conjunctivae normal.     Pupils: Pupils are equal, round, and reactive to light.  Cardiovascular:     Heart sounds: Normal heart sounds.  Pulmonary:     Effort: Pulmonary effort is normal.  Abdominal:     Palpations: Abdomen is soft.  Musculoskeletal:        General: Normal range of  motion.     Cervical back: Normal range of motion.  Skin:    General: Skin is warm and dry.  Neurological:     General: No focal deficit present.  Psychiatric:        Attention and Perception: She is inattentive.        Mood and Affect: Mood is anxious. Affect is blunt.        Speech: Speech is delayed.        Behavior: Behavior is slowed and withdrawn.        Thought Content: Thought content does not include suicidal ideation.        Cognition and Memory: Cognition is impaired.     Review of Systems  Constitutional: Negative.   HENT: Negative.   Eyes: Negative.   Respiratory: Negative.   Cardiovascular: Negative.   Gastrointestinal: Negative.   Musculoskeletal: Positive for arthralgias.  Skin: Negative.   Neurological: Negative.   Psychiatric/Behavioral: Positive for decreased concentration.    Blood pressure 105/85, pulse 86, temperature 98.7 F (37.1 C), temperature source Axillary, resp. rate 17, height 5\' 5"  (1.651 m), weight 102 kg, SpO2 100 %.Body mass index is 37.42 kg/m.  General Appearance: Casual  Eye Contact:  Minimal  Speech:  Slurred  Volume:  Decreased  Mood:  Depressed  Affect:  Congruent  Thought Process:  Coherent  Orientation:  Negative  Thought Content:  Tangential  Suicidal Thoughts:  No  Homicidal Thoughts:  No  Memory:  Immediate;   Fair Recent;   Poor Remote;   Poor  Judgement:  Impaired  Insight:  Shallow  Psychomotor Activity:  Decreased  Concentration:   Concentration: Poor  Recall:  Poor  Fund of Knowledge:  Poor  Language:  Poor  Akathisia:  No  Handed:  Right  AIMS (if indicated):     Assets:  Desire for Improvement Social Support  ADL's:  Impaired  Cognition:  Impaired,  Moderate  Sleep:        Treatment Plan Summary: Medication management and Plan  65 year old woman who appears to be having depression as part of the syndrome of mental status changes.  No other specific neurologic condition so far identified to account for behavior and mental state changes.  Reviewed current treatment plan.  While she is n.p.o. she has not been able to receive any medication for depression.  I have discontinued the Cymbalta and Remeron as written and changed it to Remeron dissolvable tablets 30 mg at night and also a low dose of olanzapine 5 mg which also can be given as a dissolvable tablet at night to help with sleep and depression.  I will continue to follow regularly.  Disposition: Supportive therapy provided about ongoing stressors.  Mordecai RasmussenJohn Seth Higginbotham, MD 12/07/2020 5:10 PM

## 2020-12-07 NOTE — Progress Notes (Signed)
ANTICOAGULATION CONSULT NOTE  Pharmacy Consult for heparin Indication: atrial fibrillation  Allergies  Allergen Reactions  . Lisinopril Swelling    Patient Measurements: Height: 5\' 5"  (165.1 cm) Weight: 95.8 kg (211 lb 4.8 oz) IBW/kg (Calculated) : 57 Heparin Dosing Weight: 78 kg  Vital Signs: Temp: 97.6 F (36.4 C) (03/01 2000) Temp Source: Oral (03/01 2000) BP: 155/95 (03/02 0000) Pulse Rate: 71 (03/02 0000)  Labs: Recent Labs    12/05/20 0432 12/06/20 0505 12/06/20 0754 12/06/20 1647 12/06/20 2325  HGB 13.1  --  10.8*  --   --   HCT 37.9  --  30.8*  --   --   PLT 235  --  269  --   --   APTT  --   --   --  37*  --   LABPROT  --   --   --  15.8*  --   INR  --   --   --  1.3*  --   HEPARINUNFRC  --   --   --   --  1.92*  CREATININE  --  1.21*  --   --   --   TROPONINIHS  --   --   --  9  --     Estimated Creatinine Clearance: 52.3 mL/min (A) (by C-G formula based on SCr of 1.21 mg/dL (H)).   Medical History: Past Medical History:  Diagnosis Date  . Arthritis   . Diabetes mellitus without complication (HCC)   . Hypertension     Assessment: 65 year old female transferred to the ICU 3/1. Patient with generalized deconditioning and failure to thrive. Dysphagia limiting safe PO intake, plan for G-tube placement. Patient with afib with RVR. Patient to start on amiodarone. Consult for heparin drip for CHA2DS2VASc 4.  Goal of Therapy:  Heparin level 0.3-0.7 units/ml Monitor platelets by anticoagulation protocol: Yes   Plan:  Heparin 4000 unit bolus followed by heparin drip at 1100 units/hr. Waiting for IV access prior to starting heparin. Will check HL approximately 6 hrs after start of heparin drip. CBC daily while on heparin drip.   0301 2325 HL 1.92, will hold heparin x 1 hour then resume at 1000 units/hr and recheck HL ~ 6 hours after restart.  71, PharmD Clinical Pharmacist 12/07/2020,12:58 AM

## 2020-12-07 NOTE — Progress Notes (Addendum)
PROGRESS NOTE    Oluwadara Gorman  ZOX:[id number] DOB: 1956-05-31 DOA: 12/03/2020 PCP[facility]    Brief Narrative:  Kalijah Westfall is a 65 y.o. African-American female with medical history significant for osteoarthritis, type 2 diabetes mellitus and hypertension, as well as COVID-19 in September 2021, who presented to the emergency room with acute onset of altered mental status and not acting herself for the last 4 days with excessive sleepiness. She has been having nausea and dry heaves without abdominal pain.  Her daughter stated that she has not eaten much and has been bed bound since September 2021 since her COVID-19 infection. Patient was admitted to the hospitalist for further work-up and treatment of acute metabolic encephalopathy and failure to thrive  On 3/1-pt was tachycardic with HR in 140's, transition to atrial fibrillation with RVR, with some transient hypotension requiring transfer to the ICU.  Hypotension resolved with 500 cc normal saline.  PCCM was consulted for further assistance along with placement of central venous access due to lack of peripheral IV access.   Cardiology consulted on 3/1-recom amiodarone loading dose wihtinfusion. Also placed orders for dig ivp if hr dosent stabilized. Heparin infusion for Chadsvas score 4.  3/2-hospitalist pickup      Consultants:   PCCM, psych, surgery, cardiology, palliative  Procedures:   Antimicrobials:       Subjective: Doesn't really talk , eyes open. Daughter answering my question. States mom is doing better.   Objective: Vitals:   12/07/20 1100 12/07/20 1200 12/07/20 1300 12/07/20 1400  BP: 122/82 (!) 87/61 118/79 105/85  Pulse:  75 70 86  Resp: Temp:      TempSrc:      SpO2:  100% 100% 100%  Weight:      Height:        Intake/Output Summary (Last 24 hours) at 12/07/2020 1539 Last data filed at 12/07/2020 1132 Gross per 24 hour  Intake 2512.54 ml  Output 165 ml  Net  2347.54 ml   Filed Weights   12/03/20 1124 12/07/20 0458  Weight: 95.8 kg 102 kg    Examination:  General exam: Appears calm and comfortable , eyes open, quite Respiratory system: Clear to auscultation. Respiratory effort normal. Cardiovascular system: S1 & S2 heard, RRR. No JVD, murmurs, rubs, gallops or clicks.  Gastrointestinal system: Abdomen is nondistended, soft and nontender.  Normal bowel sounds heard. Central nervous system: unable to assess Extremities: no edema Skin: warm, dry Psychiatry:  Mood & affect appropriate in current setting.     Data Reviewed: I have personally reviewed following labs and imaging studies  CBC: Recent Labs  Lab 12/03/20 1301 12/05/20 0432 12/06/20 0754 12/07/20 0730  WBC 12.7* 12.2* 9.4 10.8*  NEUTROABS  --  6.9  --   --   HGB 13.1 13.1 10.8* 9.6*  HCT 38.4 37.9 30.8* 28.0*  MCV 82.9 83.1 82.4 84.1  PLT 268 235 269 247   Basic Metabolic Panel: Recent Labs  Lab 12/03/20 1301 12/03/20 1437 12/05/20 0702 12/06/20 0505 12/06/20 1647 12/07/20 0437  NA 136  --   --  143  --  139  K 3.1*  --   --  3.1*  3.1*  --  3.9  CL 102  --   --  114*  --  113*  CO2 18*  --   --  19*  --  19*  GLUCOSE 124*  --   --  94  --  161*  BUN 29*  --   --  23  --  20  CREATININE 1.46*  --   --  1.21*  --  1.05*  CALCIUM 9.7  --   --  8.8*  --  8.5*  MG  --  1.7 1.7  --  2.2 1.9  PHOS  --   --  2.8  --  1.6* 2.4*   GFR: Estimated Creatinine Clearance: 62.4 mL/min (A) (by C-G formula based on SCr of 1.05 mg/dL (H)). Liver Function Tests: Recent Labs  Lab 12/03/20 1301 12/06/20 1647 12/07/20 0437  AST 37 23 25  ALT 37 34 34  ALKPHOS 366* 239* 250*  BILITOT 2.2* 1.0 0.5  PROT 6.4* 4.9* 4.9*  ALBUMIN 2.6* 2.0* 2.0*   Recent Labs  Lab 12/03/20 1301  LIPASE 48   No results for input(s): AMMONIA in the last 168 hours. Coagulation Profile: Recent Labs  Lab 12/06/20 1647  INR 1.3*   Cardiac Enzymes: No results for input(s): CKTOTAL,  CKMB, CKMBINDEX, TROPONINI in the last 168 hours. BNP (last 3 results) No results for input(s): PROBNP in the last 8760 hours. HbA1C: No results for input(s): HGBA1C in the last 72 hours. CBG: Recent Labs  Lab 12/06/20 1939 12/06/20 2335 12/07/20 0405 12/07/20 0725 12/07/20 1130  GLUCAP 137* 145* 150* 140* 161*   Lipid Profile: No results for input(s): CHOL, HDL, LDLCALC, TRIG, CHOLHDL, LDLDIRECT in the last 72 hours. Thyroid Function Tests: Recent Labs    12/05/20 0702  TSH 4.688*   Anemia Panel: Recent Labs    12/06/20 0505  VITAMINB12 1,905*   Sepsis Labs: Recent Labs  Lab 12/03/20 1437 12/06/20 1647  PROCALCITON 0.17 <0.10    Recent Results (from the past 240 hour(s))  Resp Panel by RT-PCR (Flu A&B, Covid) Nasopharyngeal Swab     Status: None   Collection Time: 12/03/20  1:35 PM   Specimen: Nasopharyngeal Swab; Nasopharyngeal(NP) swabs in vial transport medium  Result Value Ref Range Status   SARS Coronavirus 2 by RT PCR NEGATIVE NEGATIVE Final    Comment: (NOTE) SARS-CoV-2 target nucleic acids are NOT DETECTED.  The SARS-CoV-2 RNA is generally detectable in upper respiratory specimens during the acute phase of infection. The lowest concentration of SARS-CoV-2 viral copies this assay can detect is 138 copies/mL. A negative result does not preclude SARS-Cov-2 infection and should not be used as the sole basis for treatment or other patient management decisions. A negative result may occur with  improper specimen collection/handling, submission of specimen other than nasopharyngeal swab, presence of viral mutation(s) within the areas targeted by this assay, and inadequate number of viral copies(<138 copies/mL). A negative result must be combined with clinical observations, patient history, and epidemiological information. The expected result is Negative.  Fact Sheet for Patients:  BloggerCourse.comhttps://www.fda.gov/media/152166/download  Fact Sheet for Healthcare  Providers:  SeriousBroker.ithttps://www.fda.gov/media/152162/download  This test is no t yet approved or cleared by the [place] FDA and  has been authorized for detection and/or diagnosis of SARS-CoV-2 by FDA under an Emergency Use Authorization (EUA). This EUA will remain  in effect (meaning this test can be used) for the duration of the COVID-19 declaration under Section 564(b)(1) of the Act, 21 U.S.C.section 360bbb-3(b)(1), unless the authorization is terminated  or revoked sooner.       Influenza A by PCR NEGATIVE NEGATIVE Final   Influenza B by PCR NEGATIVE NEGATIVE Final    Comment: (NOTE) The Xpert Xpress SARS-CoV-2/FLU/RSV plus assay is intended as an aid in  the diagnosis of influenza from Nasopharyngeal swab specimens and should not be used as a sole basis for treatment. Nasal washings and aspirates are unacceptable for Xpert Xpress SARS-CoV-2/FLU/RSV testing.  Fact Sheet for Patients: BloggerCourse.com  Fact Sheet for Healthcare Providers: SeriousBroker.it  This test is not yet approved or cleared by the [place] FDA and has been authorized for detection and/or diagnosis of SARS-CoV-2 by FDA under an Emergency Use Authorization (EUA). This EUA will remain in effect (meaning this test can be used) for the duration of the COVID-19 declaration under Section 564(b)(1) of the Act, 21 U.S.C. section 360bbb-3(b)(1), unless the authorization is terminated or revoked.  Performed at [facility], [address] [place], [place] [address]   CULTURE, BLOOD (ROUTINE X 2) w Reflex to ID Panel     Status: None (Preliminary result)   Collection Time: 12/06/20 10:55 AM   Specimen: BLOOD  Result Value Ref Range Status   Specimen Description BLOOD LEFT WRIST  Final   Special Requests   Final    BOTTLES DRAWN AEROBIC ONLY Blood Culture adequate volume   Culture   Final    NO GROWTH < 24 HOURS Performed at [facility], [address] [place], [place] [address]    Report Status PENDING  Incomplete  CULTURE, BLOOD (ROUTINE X 2) w Reflex to ID Panel     Status: None (Preliminary result)   Collection Time: 12/06/20 11:05 AM   Specimen: BLOOD  Result Value Ref Range Status   Specimen Description BLOOD RIGHT WRIST  Final   Special Requests   Final    BOTTLES DRAWN AEROBIC ONLY Blood Culture results may not be optimal due to an inadequate volume of blood received in culture bottles   Culture   Final    NO GROWTH < 24 HOURS Performed at [facility], [address] [place], [place] [address]    Report Status PENDING  Incomplete  MRSA PCR Screening     Status: None   Collection Time: 12/06/20  1:18 PM   Specimen: Nasal Mucosa; Nasopharyngeal  Result Value Ref Range Status   MRSA by PCR NEGATIVE NEGATIVE Final    Comment:        The GeneXpert MRSA Assay (FDA approved for NASAL specimens only), is one component of a comprehensive MRSA colonization surveillance program. It is not intended to diagnose MRSA infection nor to guide or monitor treatment for MRSA infections. Performed at [facility], [address], [place], [place] [address]          Radiology Studies: MR BRAIN WO CONTRAST  Result Date: 12/05/2020 CLINICAL DATA:  Rule out stroke.  Diabetes and hypertension EXAM: MRI HEAD WITHOUT CONTRAST TECHNIQUE: Multiplanar, multiecho pulse sequences of the brain and surrounding structures were obtained without intravenous contrast. COMPARISON:  CT head 12/03/2020 FINDINGS: Brain: Generalized atrophy. Negative for hydrocephalus. Negative for acute infarct. Negative for hemorrhage. Empty sella, unchanged from prior studies. 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 This has heterogeneous signal intensity on T1 and T2 and increased signal on diffusion-weighted imaging. Mild mass-effect on the tectum. This was present on the CT of Feb 22, 2017 with possible mild interval growth.  Vascular: Negative for hyperdense vessel Skull and upper cervical spine: No focal skeletal lesion. Sinuses/Orbits: Paranasal sinuses clear.  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 Recommend follow-up MRI brain with contrast. Possible mild growth since 2018. Electronically  Signed   By: Marlan Palau M.D.   On: 12/05/2020 19:05   MR BRAIN W CONTRAST  Result Date: 12/06/2020 CLINICAL DATA:  Abnormal MRI EXAM: MRI HEAD WITH CONTRAST TECHNIQUE: Multiplanar, multiecho pulse sequences of the brain and surrounding structures were obtained with intravenous contrast. CONTRAST:  3mL GADAVIST GADOBUTROL 1 MMOL/ML IV SOLN COMPARISON:  Noncontrast MRI 12/05/2020 FINDINGS: Brain: There is heterogeneous enhancement of the previously identified pineal mass. There is mild mass effect on the tectum. Chronic enlargement of the third and lateral ventricles could be related to narrowing of the third ventricle outflow. Vascular: Unremarkable. Skull and upper cervical spine: Unremarkable. Sinuses/Orbits: No new findings. Other: Expanded, "empty" sella. 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 Chronic enlargement of the third and lateral ventricles could be related to narrowing of the third ventricle outflow by above. Electronically Signed   By: Guadlupe Spanish M.D.   On: 12/06/2020 17:10   DG Chest Port 1 View  Result Date: 12/06/2020 CLINICAL DATA:  Central line adjustment EXAM: PORTABLE CHEST 1 VIEW COMPARISON:  12/06/2020 at 1244 hours FINDINGS: Left IJ approach central venous catheter is again seen projecting over the SVC with tip closely approximating the lateral wall of the SVC. Catheter appears slightly retracted relative to the previous exam. Stable heart size. Low lung volumes. Streaky bilateral airspace opacities, similar to prior. No pneumothorax.  IMPRESSION: 1. Left IJ approach central venous catheter again seen with tip closely approximating the lateral wall of the SVC. Consider repositioning. 2. No pneumothorax. Electronically Signed   By: Duanne Guess D.O.   On: 12/06/2020 15:43   DG Chest Port 1 View  Result Date: 12/06/2020 CLINICAL DATA:  Assess central line placement. EXAM: PORTABLE CHEST 1 VIEW COMPARISON:  Chest radiograph December 06, 2020 FINDINGS: Left IJ central venous catheter with tip overlying the SVC, of note the tip of the catheter is oriented against the sidewall of the SVC which has been reported to potentially induce vascular injury. The heart size and mediastinal contours are unchanged. Similar diffuse airspace opacities. No significant pleural effusion or visible pneumothorax. The visualized skeletal structures are unchanged. IMPRESSION: Left IJ central venous catheter with tip overlying the SVC. Of note the tip of the catheter is oriented against the sidewall of the SVC which has been reported to potentially induce vascular injury, repositioning/advancement is suggested. No visible pneumothorax. Electronically Signed   By: Maudry Mayhew MD   On: 12/06/2020 13:13   DG Chest Port 1 View  Result Date: 12/06/2020 CLINICAL DATA:  Tachycardia EXAM: PORTABLE CHEST 1 VIEW COMPARISON:  06/17/2020 FINDINGS: Cardiac shadow is stable. Aortic calcifications are again seen. Diffuse airspace opacities are noted but significantly improved when compared with the prior exam consistent with residual scarring from prior COVID-19 infection. No sizable effusion is noted. No bony abnormality is seen. IMPRESSION: Bilateral scarring improved from the prior exam likely related to COVID-19 sequelae. Electronically Signed   By: Alcide Clever M.D.   On: 12/06/2020 08:47        Scheduled Meds: . Chlorhexidine Gluconate Cloth  6 each Topical Daily  . diclofenac Sodium  2 g Topical QID  . digoxin  0.25 mg Intravenous Once  . insulin aspart  0-9  Units Subcutaneous Q4H  . metoprolol succinate  25 mg Oral Daily  . mirtazapine  30 mg Oral QHS  . OLANZapine zydis  5 mg Oral QHS   Continuous Infusions: . amiodarone 30 mg/hr (  12/07/20 1317)  . dextrose 5% lactated ringers 125 mL/hr at 12/07/20 1316  . heparin 800 Units/hr (12/07/20 1318)    Assessment & Plan:   Active Problems:   AKI (acute kidney injury) (HCC)   Adult failure to thrive   Hypokalemia   Depression   Weight loss   Altered mental status   Tachycardia   Hypotension   Cosandra Thompsonis a 65 y.o.African-American femalewith medical history significant forosteoarthritis, type 2 diabetes mellitus and hypertension, as well as COVID-19 in September 2021, who presented to the emergency room with acute onset of altered mental status and not acting herself for the last 4 days with excessive sleepiness.She has been having nausea and dry heaves without abdominal pain. Her daughter stated that she has not eaten much and has been bed bound since September 2021 since her COVID-19 infection.    Adult failure to thrive/poor PO intake/generalized deconditioning Hypokalemia/depressed mood -- patient came in with ongoing weight loss and poor PO intake per daughter. Since COVID infection in September 2021 per daughter patient has been going downhill. --She was admitted at [facility] after discharge from [facility] with adult failure to thrive and poor PO intake. Patient also went to rehab in [place]. -- She recently was seen in [facility] emergency room on 10th of February she was discharged to go home with home health and daughter thinking patient will hopefully do better with her mood and eating. -- Daughter says patient has lost more than hundred pounds since September 2021. She has not ambulated and no interest in any acitivity. Stays in bed most of the time --she is requesting if patient can get a feeding tube -- dietitian consult-- for PEG/NG feeding -- speech therapy--  patient showed very little interest in participating with speech therapy. Pt has Dsyphagia and at risk for aspiration. --spoke with IR RN regarding PICC line and G-tube/peg tube placement  --discussed with daughter risk and complications of feeding tube, she voiced understanding patient's daughter will sign the consent. --3/1-- patient is tachycardic despite IV fluids and IV metoprolol 5 mg x1 dose soft blood pressure and continued encephalopathy. Will's transfer her to ICU --spoke with ICU NP Jeri Modena to follow pt.--pt will need Central line due to poor IV access. 3/2-TRH pickup.  Plan for IR for Peg placement on friday  Hypotension with sinus tachycardia, acute renal failure hypokalemia AKI improved with ivf Avoid hypotension Continue ivf Pharmacy to replace electrolytes   Atrial fib-rvr Was started on amiodarone infusion .  Cards following Now converted to NSR Plan for amiodarone infusion for 24hrs followed by conversion to po dosing. 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 Neuro- recommends discussed with neurosurgery. --3/1-- per Dr Adriana Simas "This looks rather stable compared to CT from previous. This is likely benign, possible even cystic. She would need a contrasted image to delineate. No urgent intervention needed as we know it has been there for some time. Unrelated to current issues and wont cause encephalopathy  3/2-f/u neurosurgery as outpt to discuss options going forward once she recovers from acute illness. No intervention recommended at this time.    Abnormal ultrasound abdomen with gallbladder distention similar to previous imaging studies -- clinically patient does not appear to be septic or having any infection -- discontinue IV antibiotics-- no source of infection noted so far, chest x-ray negative for pneumonia. Appears to have some opacities from previous COVID infection.sats  are more  than 92% on room air 3/2-surgery Dr. Lady Gary - no surgical indication at present for gallbladder. Labs stable.     Depressed mood -- continue Cymbalta , remeron --  psych consult with Dr. Toni Amend.   -- patient has appointment for geriatric evaluation at [facility] per daughter in April 2022  Type II diabetes with sugars controlled -- sliding scale insulin -- hold PO diabetes meds  History of tachycardia/SVT -- patient follows with cardiology at [facility] -- recently started on Toprol-XL hundred milligrams daily. With soft blood pressure decreased dose to 25 mg daily..>if need to can switch to 12.5 mg bid with parameters  Generalized deconditioning -- PT evaluation noted--rec SNF  Palliative following   DVT prophylaxis: heparin Code Status:full Family Communication: daughter at bedside  Status is: Inpatient  Patient is inpatient due to her severity of illness requiring hospitalization and needing IV treatment  Dispo: The patient is from: Home              Anticipated d/c is to: SNF              Patient currently is not medically stable to d/c.   Difficult to place patient No  Pt still needing acute care, getting peg placement, pt current npo, no NGT ,  needs snf. D/c likely >3 days            LOS: 4 days   Time spent: 35 min with >50% on coc    Lynn Ito, MD [facility] Pager [phone]  If 7PM-7AM, please contact night-coverage 12/07/2020, 3:39 PM

## 2020-12-07 NOTE — Progress Notes (Signed)
ANTICOAGULATION CONSULT NOTE  Pharmacy Consult for heparin Indication: atrial fibrillation  Allergies  Allergen Reactions  . Lisinopril Swelling    Patient Measurements: Height: 5\' 5"  (165.1 cm) Weight: 102 kg (224 lb 13.9 oz) IBW/kg (Calculated) : 57 Heparin Dosing Weight: 78 kg  Vital Signs: Temp: 98.4 F (36.9 C) (03/02 1600) Temp Source: Oral (03/02 1600) BP: 116/80 (03/02 1600) Pulse Rate: 89 (03/02 1700)  Labs: Recent Labs    12/05/20 0432 12/06/20 0505 12/06/20 0754 12/06/20 1647 12/06/20 2325 12/07/20 0437 12/07/20 0730 12/07/20 1642  HGB 13.1  --  10.8*  --   --   --  9.6*  --   HCT 37.9  --  30.8*  --   --   --  28.0*  --   PLT 235  --  269  --   --   --  247  --   APTT  --   --   --  37*  --   --   --   --   LABPROT  --   --   --  15.8*  --   --   --   --   INR  --   --   --  1.3*  --   --   --   --   HEPARINUNFRC  --   --   --   --  1.92*  --  1.41* 1.02*  CREATININE  --  1.21*  --   --   --  1.05*  --   --   TROPONINIHS  --   --   --  9  --  11  --   --     Estimated Creatinine Clearance: 62.4 mL/min (A) (by C-G formula based on SCr of 1.05 mg/dL (H)).   Medical History: Past Medical History:  Diagnosis Date  . Arthritis   . Diabetes mellitus without complication (HCC)   . Hypertension     Assessment: 65 year old female transferred to the ICU 3/1. Patient with generalized deconditioning and failure to thrive. Dysphagia limiting safe PO intake, plan for G-tube placement. Patient with afib with RVR. Patient to start on amiodarone. Consult for heparin drip for CHA2DS2VASc 4.  Goal of Therapy:  Heparin level 0.3-0.7 units/ml Monitor platelets by anticoagulation protocol: Yes   3/1 2325 HL 1.92, supratherapeutic 3/2 0730 HL 1.41, supratherapeutic, Heparin held for one hour. Restarted at 800 units/hr 3/2 1642 HL 1.02, supreatherapeutic  Plan:  Heparin level supratherapeutic. Communicated to nurse. Will hold infusion for 1 hour and resumed at  lower rate of 450 units/hr.  Recheck HL 6 hours after new rate resumed. HL ordered for 0302 @ 0130  CBC daily while on heparin drip.  71, PharmD, BCPS Clinical Pharmacist 12/07/2020,6:11 PM

## 2020-12-07 NOTE — Evaluation (Addendum)
Clinical/Bedside Swallow Evaluation Patient Details  Name: Chelsea Glass MRN: [id number] Date of Birth: [birth year]  Today's Date: 12/05/20 Time: 1400 (Computer error, Pt seen and notes entered in flow sheet on correct date and time, but did not flow over to assessement/note until 12/07/20 )        Past Medical History:  Past Medical History:  Diagnosis Date  . Arthritis   . Diabetes mellitus without complication (HCC)   . Hypertension    Past Surgical History:  Past Surgical History:  Procedure Laterality Date  . TUBAL LIGATION    . UTERINE FIBROID SURGERY     HPI: Pt seen for assessment 12/05/20 note entered into flowsheet. Computer error: Note did not flow over to assessment until 12/07/20 Per admitting H&P "Chelsea Glass is a 65 y.o. African-American female with medical history significant for osteoarthritis, type 2 diabetes mellitus and hypertension, as well as COVID-19 in September 2021, who presented to the emergency room with acute onset of altered mental status and not acting herself for the last 4 days with excessive sleepiness. She has been having nausea and dry heaves without abdominal pain.  Her daughter stated that she has not eaten much and has been bed bound since September 2021 since her COVID-19 infection.  No dysuria, oliguria or hematuria or flank pain.  No chest pain or palpitations.  No headache or dizziness or blurred vision.  No paresthesias or focal muscle weakness. "   Assessment / Plan / Recommendation Clinical Impression  Pt presents with moderate dysphagia with inability to maintain nutrition and hydration via PO intake. Pt was lethargic upon ST entering the room but was able to awaken with sternum rub and repositioning in the bed. Given max cues Pt. took a few sips of water via cup. Noted oral hold with 3 of 6 sips of water, needing max cues to swallow. Pt often nodded thinking she had swallowed when she had not. Given a bite of applesauce, Pt presented with  very slight opening of the mouth allowing only a tiny amount of the bolus to enter. Oral transit delay again needing cues to swallow. Attempted a small piece of solid graham cracker. She opened for the bite but held it between her teeth, eventually being removed by ST. Pt is at risk for aspiration with Po's given the above performance today. Pt has had multiple admissions and significant weight loss over the past several months since her COVID 19 in September 2021. Given altered mental status and poor nutrition combined with Dysphagia, would consider PEG placement as intake is not likely to significantly improve over the next several days or weeks. SLP Visit Diagnosis: Dysphagia, oropharyngeal phase (R13.12)    Aspiration Risk  Moderate aspiration risk;Risk for inadequate nutrition/hydration    Diet Recommendation NPO;Alternative means - long-term   Medication Administration: Via alternative means    Other  Recommendations Recommended Consults: Other (Comment) (Concult for Feeding tube) Oral Care Recommendations: Oral care BID   Follow up Recommendations Other (comment) (Daughter wants to take Pt home)      Frequency and Duration     F/u with plan for PEG       Prognosis Fair   Swallow Study   General Date of Onset: 12/03/20 HPI: Per admitting H&P "Chelsea Glass is a 65 y.o. African-American female with medical history significant for osteoarthritis, type 2 diabetes mellitus and hypertension, as well as COVID-19 in September 2021, who presented to the emergency room with acute onset of altered mental status and  not acting herself for the last 4 days with excessive sleepiness. She has been having nausea and dry heaves without abdominal pain.  Her daughter stated that she has not eaten much and has been bed bound since September 2021 since her COVID-19 infection.  No dysuria, oliguria or hematuria or flank pain.  No chest pain or palpitations.  No headache or dizziness or blurred vision.   No paresthesias or focal muscle weakness. " Respiratory Status: Room air History of Recent Intubation: No Behavior/Cognition: Lethargic/Drowsy;Requires cueing Oral Cavity - Dentition: Poor condition;Missing dentition Baseline Vocal Quality: Other (comment) (No voicing)    Oral/Motor/Sensory Function Overall Oral Motor/Sensory Function: Mild impairment Lingual ROM: Reduced right;Reduced left Lingual Strength: Reduced   Ice Chips Ice chips: Within functional limits   Thin Liquid Thin Liquid: Impaired Presentation: Cup Oral Phase Impairments: Poor awareness of bolus;Other (comment) (Needed cues to swallow) Oral Phase Functional Implications: Oral holding;Prolonged oral transit Pharyngeal  Phase Impairments: Suspected delayed Swallow    Nectar Thick Nectar Thick Liquid: Not tested   Honey Thick Honey Thick Liquid: Not tested   Puree Puree: Impaired Presentation: Spoon Oral Phase Impairments: Poor awareness of bolus;Reduced lingual movement/coordination Oral Phase Functional Implications: Prolonged oral transit;Oral holding   Solid     Solid: Impaired Presentation:  (placed on Pts lips and between teeth) Oral Phase Impairments: Poor awareness of bolus;Impaired mastication Oral Phase Functional Implications: Oral holding      Eather Colas 12/07/2020,4:50 PM   Pt seen and note entered in flowsheet 12/05/20 at 1400. Computer error and did not flow over to evaluation until 12/07/20.   **Date/time updated by Cephus Slater, PT, Acute Rehab Supervisor, to reflect correct treatment time and date. Donnisha Besecker H. Manson Passey, PT, DPT, NCS 12/08/20, 8:23 AM [phone]

## 2020-12-07 NOTE — Progress Notes (Signed)
PHARMACY CONSULT NOTE - FOLLOW UP  Pharmacy Consult for Electrolyte Monitoring and Replacement   Recent Labs: Potassium (mmol/L)  Date Value  12/07/2020 3.9   Magnesium (mg/dL)  Date Value  03/00/9233 1.9   Calcium (mg/dL)  Date Value  00/76/2263 8.5 (L)   Albumin (g/dL)  Date Value  33/54/5625 2.0 (L)   Phosphorus (mg/dL)  Date Value  63/89/3734 2.4 (L)   Sodium (mmol/L)  Date Value  12/07/2020 139     Assessment: 65 year old female transferred to the ICU 3/1. Patient with generalized deconditioning and failure to thrive. Dysphagia limiting safe PO intake, plan for G-tube placement. Pharmacy consult for electrolyte replacement.  Goal of Therapy:  Electrolytes WNL  Plan:  Patient NPO, plan for G-tube on Friday. Phos slightly low today but improved from IV replacement yesterday. Without PO access and phos just below normal limits, will defer electrolyte replacement today. Follow up with morning labs.   Pricilla Riffle ,PharmD Clinical Pharmacist 12/07/2020 11:28 AM

## 2020-12-07 NOTE — Progress Notes (Addendum)
SUBJECTIVE: Patient resting in bed with daughter at bedside.  Patient continues to be nonverbal but shook her head no to chest pain or shortness of breath.   Vitals:   12/07/20 0800 12/07/20 0900 12/07/20 1000 12/07/20 1100  BP: 114/80 114/77 107/76 122/82  Pulse: 77 76 77   Resp: 14 14 15 13   Temp: 98.7 F (37.1 C)     TempSrc: Axillary     SpO2: 100% 100% 100%   Weight:      Height:        Intake/Output Summary (Last 24 hours) at 12/07/2020 1223 Last data filed at 12/07/2020 1132 Gross per 24 hour  Intake 2512.54 ml  Output 165 ml  Net 2347.54 ml    LABS: Basic Metabolic Panel: Recent Labs    12/06/20 0505 12/06/20 1647 12/07/20 0437  NA 143  --  139  K 3.1*  3.1*  --  3.9  CL 114*  --  113*  CO2 19*  --  19*  GLUCOSE 94  --  161*  BUN 23  --  20  CREATININE 1.21*  --  1.05*  CALCIUM 8.8*  --  8.5*  MG  --  2.2 1.9  PHOS  --  1.6* 2.4*   Liver Function Tests: Recent Labs    12/06/20 1647 12/07/20 0437  AST 23 25  ALT 34 34  ALKPHOS 239* 250*  BILITOT 1.0 0.5  PROT 4.9* 4.9*  ALBUMIN 2.0* 2.0*   No results for input(s): LIPASE, AMYLASE in the last 72 hours. CBC: Recent Labs    12/05/20 0432 12/06/20 0754 12/07/20 0730  WBC 12.2* 9.4 10.8*  NEUTROABS 6.9  --   --   HGB 13.1 10.8* 9.6*  HCT 37.9 30.8* 28.0*  MCV 83.1 82.4 84.1  PLT 235 269 247   Cardiac Enzymes: No results for input(s): CKTOTAL, CKMB, CKMBINDEX, TROPONINI in the last 72 hours. BNP: Invalid input(s): POCBNP D-Dimer: Recent Labs    12/06/20 0754  DDIMER 1.48*   Hemoglobin A1C: No results for input(s): HGBA1C in the last 72 hours. Fasting Lipid Panel: No results for input(s): CHOL, HDL, LDLCALC, TRIG, CHOLHDL, LDLDIRECT in the last 72 hours. Thyroid Function Tests: Recent Labs    12/05/20 0702  TSH 4.688*   Anemia Panel: Recent Labs    12/06/20 0505  VITAMINB12 1,905*     PHYSICAL EXAM General: Well developed, well nourished, in no acute distress HEENT:   Normocephalic and atramatic Neck:  No JVD.  Lungs: Clear bilaterally to auscultation and percussion. Heart: HRRR . Normal S1 and S2 without gallops or murmurs.  Abdomen: Bowel sounds are positive, abdomen soft and non-tender  Msk:  Back normal, normal gait. Normal strength and tone for age. Extremities: No clubbing, cyanosis or edema.   Neuro: Nonverbal  Psych: Nonverbal  TELEMETRY: NSR.  76/BPM  ASSESSMENT AND PLAN: Patient presenting to the emergency department with altered mental status later developed most likely aspiration pneumonia and atrial fibrillation with RVR.  Patient initially converted with carotid massage during CVC placement but later developed A. fib with RVR again and was initiated on amiodarone infusion.  Patient has since successfully converted. We will plan to have amiodarone infusion as NG tube placement has been deferred.  Blood pressure remained stable. With PEG tube being placed later this admission please remain on heparin until then with transition eliquis 5mg  bid after.  We will continue to follow.  Active Problems:   AKI (acute kidney injury) (HCC)   Adult  failure to thrive   Hypokalemia   Depression   Weight loss   Altered mental status   Tachycardia   Hypotension    Gardenia Phlegm 12/07/2020 12:23 PM

## 2020-12-07 NOTE — Progress Notes (Signed)
Pt received from ICU to room 240A, accompanied by 2 staff and pt's daughter. Pt on Amio gtts, vitals stable, pt place on tele monitor. Heparin gtts currently on hold. Staff will continue to monitor pt.

## 2020-12-07 NOTE — Progress Notes (Signed)
ANTICOAGULATION CONSULT NOTE  Pharmacy Consult for heparin Indication: atrial fibrillation  Allergies  Allergen Reactions  . Lisinopril Swelling    Patient Measurements: Height: 5\' 5"  (165.1 cm) Weight: 102 kg (224 lb 13.9 oz) IBW/kg (Calculated) : 57 Heparin Dosing Weight: 78 kg  Vital Signs: Temp: 98.7 F (37.1 C) (03/02 0800) Temp Source: Axillary (03/02 0800) BP: 107/76 (03/02 1000) Pulse Rate: 77 (03/02 1000)  Labs: Recent Labs    12/05/20 0432 12/06/20 0505 12/06/20 0754 12/06/20 1647 12/06/20 2325 12/07/20 0437 12/07/20 0730  HGB 13.1  --  10.8*  --   --   --  9.6*  HCT 37.9  --  30.8*  --   --   --  28.0*  PLT 235  --  269  --   --   --  247  APTT  --   --   --  37*  --   --   --   LABPROT  --   --   --  15.8*  --   --   --   INR  --   --   --  1.3*  --   --   --   HEPARINUNFRC  --   --   --   --  1.92*  --  1.41*  CREATININE  --  1.21*  --   --   --  1.05*  --   TROPONINIHS  --   --   --  9  --  11  --     Estimated Creatinine Clearance: 62.4 mL/min (A) (by C-G formula based on SCr of 1.05 mg/dL (H)).   Medical History: Past Medical History:  Diagnosis Date  . Arthritis   . Diabetes mellitus without complication (HCC)   . Hypertension     Assessment: 65 year old female transferred to the ICU 3/1. Patient with generalized deconditioning and failure to thrive. Dysphagia limiting safe PO intake, plan for G-tube placement. Patient with afib with RVR. Patient to start on amiodarone. Consult for heparin drip for CHA2DS2VASc 4.  Goal of Therapy:  Heparin level 0.3-0.7 units/ml Monitor platelets by anticoagulation protocol: Yes   3/1 2325 HL 1.92, supratherapeutic 3/2 0730 HL 1.41, supratherapeutic  Plan:  Heparin held for one hour. Restarted at 800 units/hr. HL ordered for 1700. CBC daily while on heparin drip.  69, PharmD Clinical Pharmacist 12/07/2020,11:24 AM

## 2020-12-08 ENCOUNTER — Other Ambulatory Visit: Payer: Self-pay

## 2020-12-08 ENCOUNTER — Inpatient Hospital Stay
Admit: 2020-12-08 | Discharge: 2020-12-08 | Disposition: A | Discharge status: Home / Self Care | Payer: Medicare Other | Attending: Nurse Practitioner | Admitting: Nurse Practitioner

## 2020-12-08 ENCOUNTER — Inpatient Hospital Stay: Payer: Medicare Other

## 2020-12-08 ENCOUNTER — Inpatient Hospital Stay: Admit: 2020-12-08 | Payer: Medicare Other

## 2020-12-08 DIAGNOSIS — F32A Depression, unspecified: Secondary | ICD-10-CM | POA: Diagnosis not present

## 2020-12-08 DIAGNOSIS — R627 Adult failure to thrive: Secondary | ICD-10-CM | POA: Diagnosis not present

## 2020-12-08 DIAGNOSIS — R400 Somnolence: Secondary | ICD-10-CM | POA: Diagnosis not present

## 2020-12-08 DIAGNOSIS — R000 Tachycardia, unspecified: Secondary | ICD-10-CM

## 2020-12-08 DIAGNOSIS — N179 Acute kidney failure, unspecified: Secondary | ICD-10-CM | POA: Diagnosis not present

## 2020-12-08 DIAGNOSIS — I959 Hypotension, unspecified: Secondary | ICD-10-CM

## 2020-12-08 LAB — COMPREHENSIVE METABOLIC PANEL
ALT: 35 U/L (ref 0–44)
AST: 27 U/L (ref 15–41)
Albumin: 1.8 g/dL — ABNORMAL LOW (ref 3.5–5.0)
Alkaline Phosphatase: 195 U/L — ABNORMAL HIGH (ref 38–126)
Anion gap: 7 (ref 5–15)
BUN: 19 mg/dL (ref 8–23)
CO2: 19 mmol/L — ABNORMAL LOW (ref 22–32)
Calcium: 8.5 mg/dL — ABNORMAL LOW (ref 8.9–10.3)
Chloride: 115 mmol/L — ABNORMAL HIGH (ref 98–111)
Creatinine, Ser: 1.35 mg/dL — ABNORMAL HIGH (ref 0.44–1.00)
GFR, Estimated: 43 mL/min — ABNORMAL LOW (ref >60)
Glucose, Bld: 191 mg/dL — ABNORMAL HIGH (ref 70–99)
Potassium: 3.3 mmol/L — ABNORMAL LOW (ref 3.5–5.1)
Sodium: 141 mmol/L (ref 135–145)
Total Bilirubin: 0.6 mg/dL (ref 0.3–1.2)
Total Protein: 4.3 g/dL — ABNORMAL LOW (ref 6.5–8.1)

## 2020-12-08 LAB — CBC
HCT: 19.1 % — ABNORMAL LOW (ref 36.0–46.0)
Hemoglobin: 6.5 g/dL — ABNORMAL LOW (ref 12.0–15.0)
MCH: 28.5 pg (ref 26.0–34.0)
MCHC: 34 g/dL (ref 30.0–36.0)
MCV: 83.8 fL (ref 80.0–100.0)
Platelets: 214 10*3/uL (ref 150–400)
RBC: 2.28 MIL/uL — ABNORMAL LOW (ref 3.87–5.11)
RDW: 19 % — ABNORMAL HIGH (ref 11.5–15.5)
WBC: 13.3 10*3/uL — ABNORMAL HIGH (ref 4.0–10.5)
nRBC: 0.2 % (ref 0.0–0.2)

## 2020-12-08 LAB — PHOSPHORUS: Phosphorus: 2 mg/dL — ABNORMAL LOW (ref 2.5–4.6)

## 2020-12-08 LAB — URINE CULTURE: Culture: NO GROWTH

## 2020-12-08 LAB — MAGNESIUM: Magnesium: 1.6 mg/dL — ABNORMAL LOW (ref 1.7–2.4)

## 2020-12-08 LAB — HEPARIN LEVEL (UNFRACTIONATED)
Heparin Unfractionated: 0.6 IU/mL (ref 0.30–0.70)
Heparin Unfractionated: 0.37 IU/mL (ref 0.30–0.70)

## 2020-12-08 LAB — GLUCOSE, CAPILLARY
Glucose-Capillary: 166 mg/dL — ABNORMAL HIGH (ref 70–99)
Glucose-Capillary: 161 mg/dL — ABNORMAL HIGH (ref 70–99)
Glucose-Capillary: 141 mg/dL — ABNORMAL HIGH (ref 70–99)
Glucose-Capillary: 163 mg/dL — ABNORMAL HIGH (ref 70–99)
Glucose-Capillary: 164 mg/dL — ABNORMAL HIGH (ref 70–99)

## 2020-12-08 LAB — HEMOGLOBIN AND HEMATOCRIT, BLOOD
HCT: 21.9 % — ABNORMAL LOW (ref 36.0–46.0)
Hemoglobin: 7.4 g/dL — ABNORMAL LOW (ref 12.0–15.0)

## 2020-12-08 IMAGING — US US RENAL
1 series · 14 of 25 positions shown · non-contrast
Comparison: CT abdomen [DATE]

CLINICAL DATA: Acute renal failure

EXAM:
RENAL / URINARY TRACT ULTRASOUND COMPLETE

[Series 1: us renal · 14 of 34 slices shown]
[im 1/34]
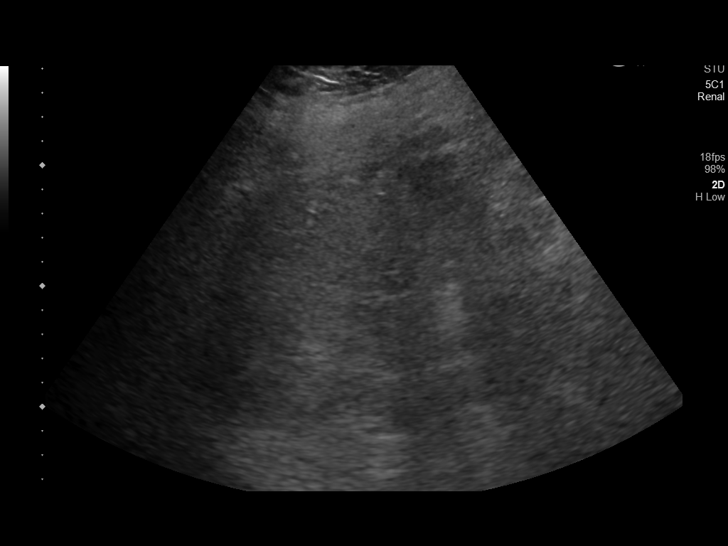
[im 3/34]
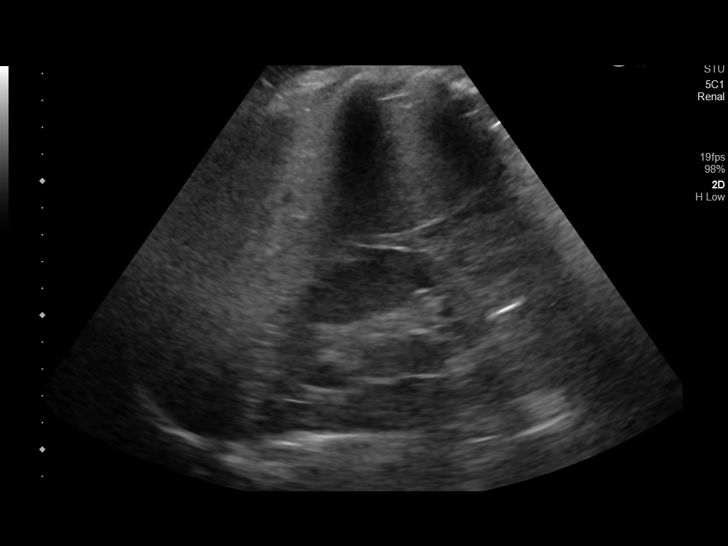
[im 6/34]
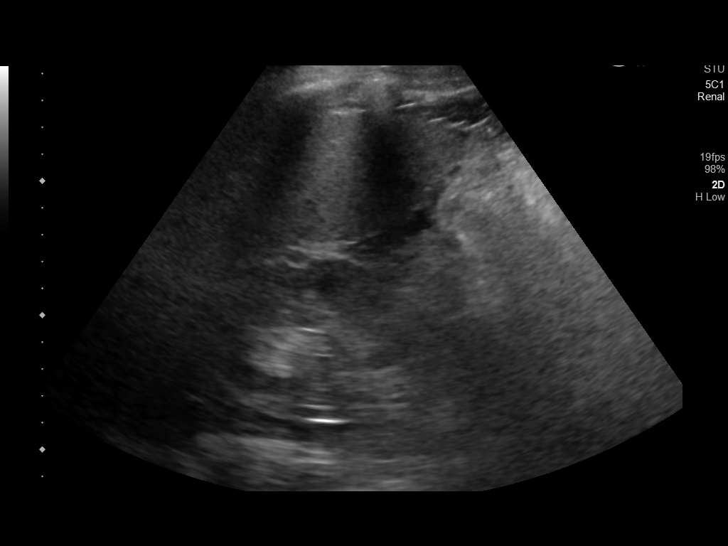
[im 9/34]
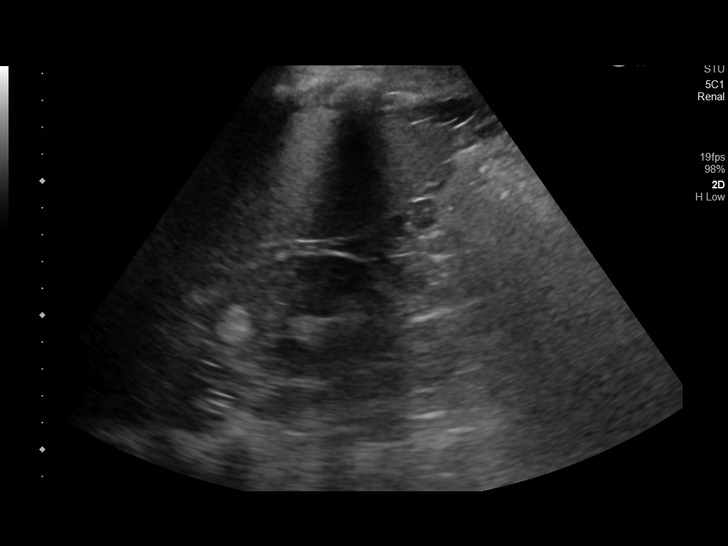
[im 12/34]
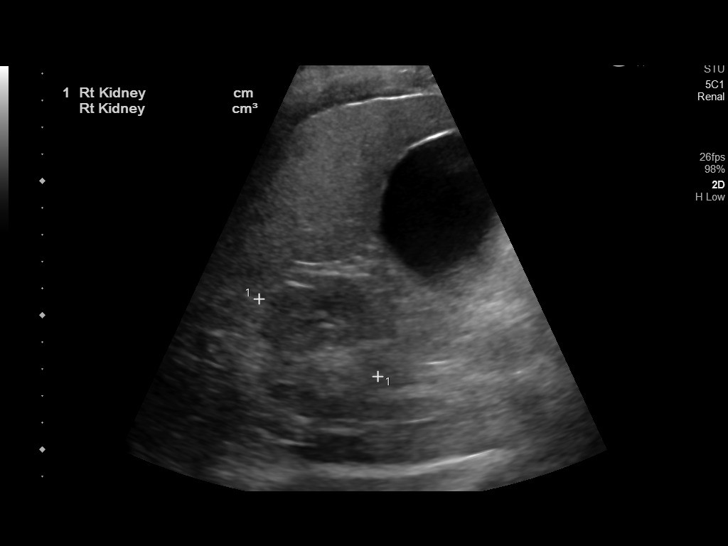
[im 13/34]
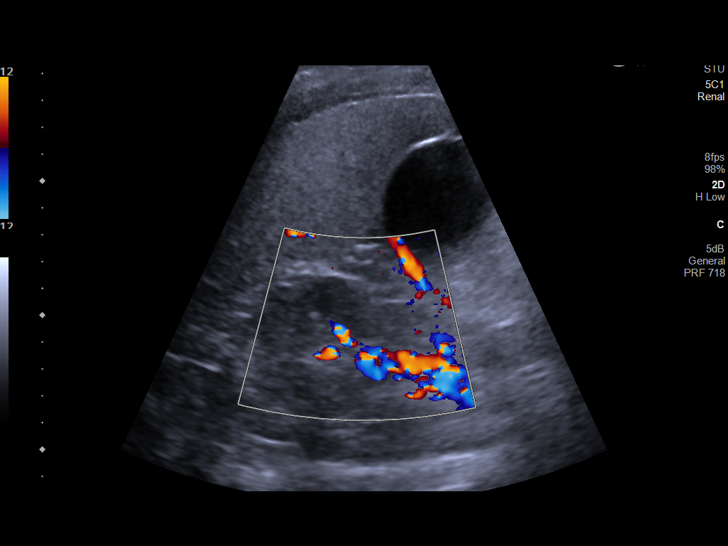
[im 16/34]
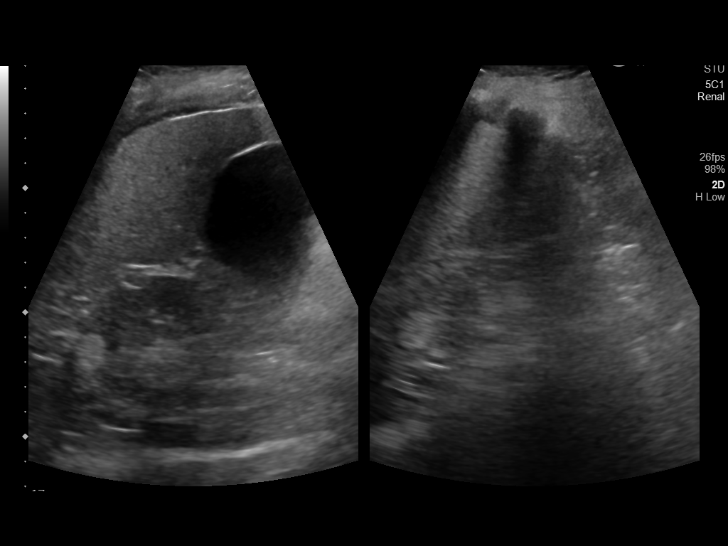
[im 18/34]
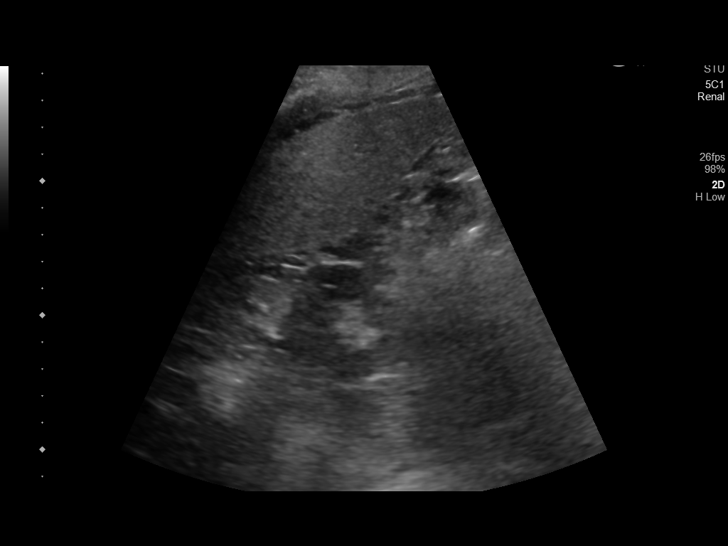
[im 21/34]
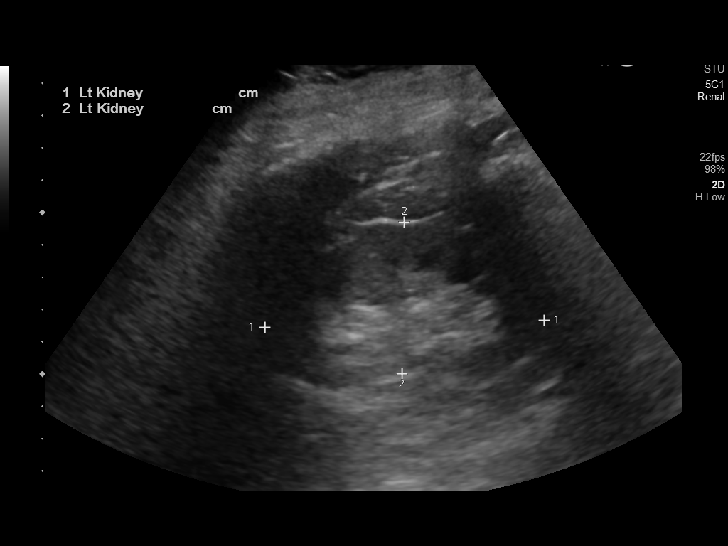
[im 23/34]
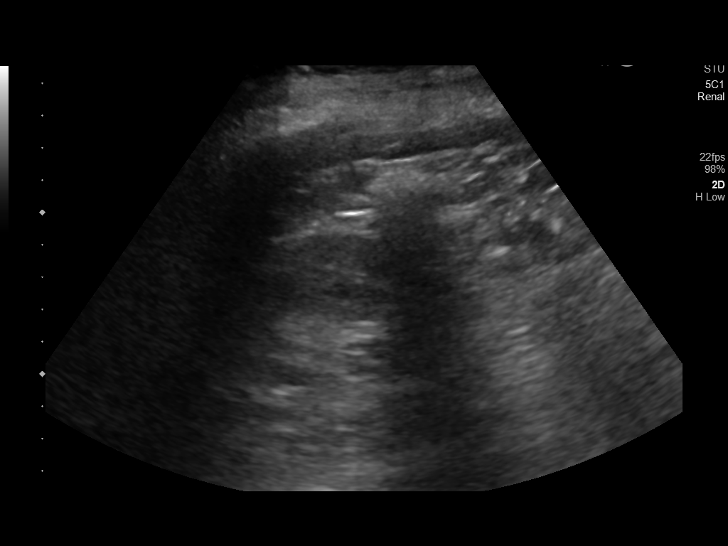
[im 25/34]
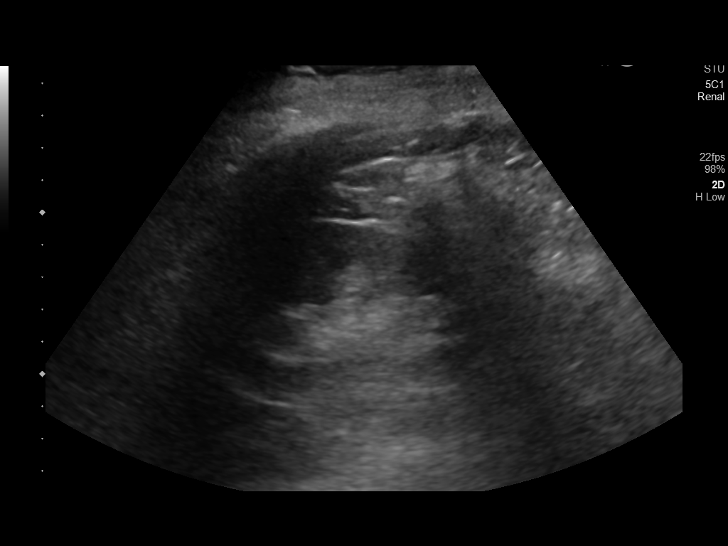
[im 28/34]
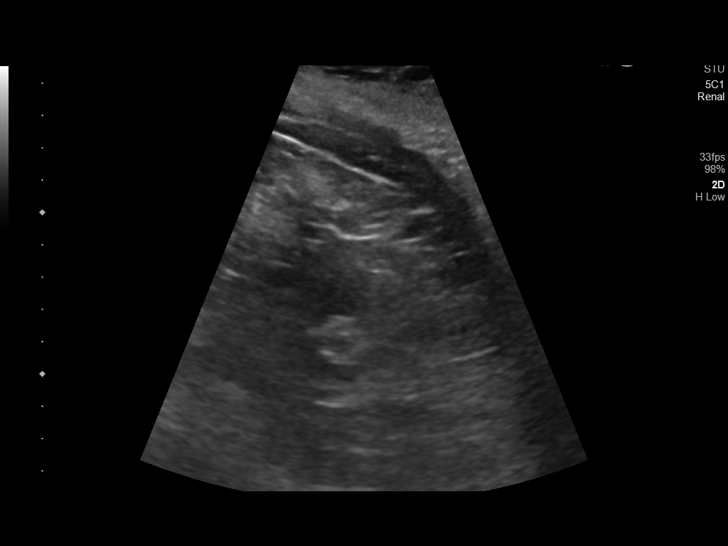
[im 31/34]
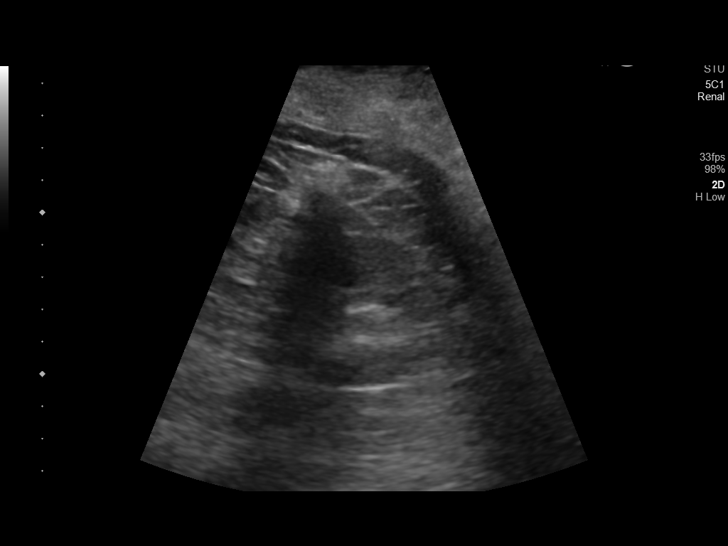
[im 34/34]
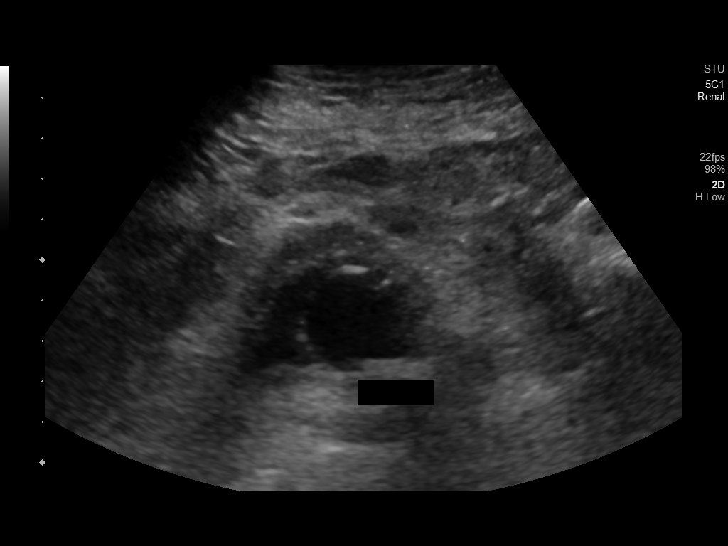

[14 of 25 positions shown; findings below may reference images not displayed]

FINDINGS: Right Kidney:

Renal measurements: 8.0 x 4.6 x 5.3 cm = volume: 103 mL. Negative
for obstruction. Renal cortical echogenicity normal. 14 x 15 mm
echogenic lesion in the right upper pole consistent with
angiomyolipoma containing fat. Fatty lesion noted in this area on
CT.

Left Kidney:

Renal measurements: 8.7 x 4.7 x 5.1 cm = volume: 108 mL.
Echogenicity within normal limits. No mass or hydronephrosis
visualized.

Bladder:

Empty bladder with Foley catheter

Other:

Fatty liver.
IMPRESSION: Small kidneys bilaterally.  No renal obstruction.

15 mm angio mild lipoma right upper kidney.

## 2020-12-08 MED ORDER — SODIUM CHLORIDE 0.9 % IV BOLUS
250.0000 mL | Freq: Once | INTRAVENOUS | Status: AC
  Administered 2020-12-08: 250 mL via INTRAVENOUS

## 2020-12-08 MED ORDER — MIDODRINE HCL 5 MG PO TABS: 2.5000 mg | ORAL_TABLET | Freq: Three times a day (TID) | ORAL | Status: DC

## 2020-12-08 MED ORDER — LIDOCAINE 5 % EX PTCH
1.0000 | MEDICATED_PATCH | CUTANEOUS | Status: DC
  Administered 2020-12-08 – 2020-12-15 (×7): 1 via TRANSDERMAL
  Filled 2020-12-08 (×9): qty 1

## 2020-12-08 MED ORDER — POTASSIUM PHOSPHATES 15 MMOLE/5ML IV SOLN
15.0000 mmol | Freq: Once | INTRAVENOUS | Status: AC
  Administered 2020-12-08: 15 mmol via INTRAVENOUS
  Filled 2020-12-08: qty 5

## 2020-12-08 MED ORDER — SODIUM CHLORIDE 0.9 % IV SOLN: INTRAVENOUS | Status: DC

## 2020-12-08 MED ORDER — MAGNESIUM SULFATE 2 GM/50ML IV SOLN
2.0000 g | Freq: Once | INTRAVENOUS | Status: AC
  Administered 2020-12-08: 2 g via INTRAVENOUS
  Filled 2020-12-08: qty 50

## 2020-12-08 NOTE — Progress Notes (Addendum)
PROGRESS NOTE    Margee Trentham  ZOX:[id number] DOB: [birth year] DOA: 12/03/2020 PCP: [facility]    Brief Narrative:  Eartha Vonbehren is a 65 y.o. African-American female with medical history significant for osteoarthritis, type 2 diabetes mellitus and hypertension, as well as COVID-19 in September 2021, who presented to the emergency room with acute onset of altered mental status and not acting herself for the last 4 days with excessive sleepiness. She has been having nausea and dry heaves without abdominal pain.  Her daughter stated that she has not eaten much and has been bed bound since September 2021 since her COVID-19 infection. Patient was admitted to the hospitalist for further work-up and treatment of acute metabolic encephalopathy and failure to thrive  On 3/1-pt was tachycardic with HR in 140's, transition to atrial fibrillation with RVR, with some transient hypotension requiring transfer to the ICU.  Hypotension resolved with 500 cc normal saline.  PCCM was consulted for further assistance along with placement of central venous access due to lack of peripheral IV access.   Cardiology consulted on 3/1-recom amiodarone loading dose wihtinfusion. Also placed orders for dig ivp if hr dosent stabilized. Heparin infusion for Chadsvas score 4.  3/2-hospitalist pickup   3/3-Hg 6.5 this am, repeat Hg 7.4   Consultants:   PCCM, psych, surgery, cardiology, palliative  Procedures:   Antimicrobials:       Subjective: Today, pt responds to my question today. Has no complaints of pain, sob, cp. Daughter at bedside  Objective: Vitals:   12/07/20 2240 12/08/20 0027 12/08/20 0349 12/08/20 0746  BP: 93/79 102/80 98/68 91/60   Pulse:  90 87 84  Resp:   15 16  Temp:   97.6 F (36.4 C) 97.9 F (36.6 C)  TempSrc:   Axillary Axillary  SpO2:   94% 95%  Weight:   103 kg   Height:        Intake/Output Summary (Last 24 hours) at 12/08/2020 0901 Last data filed  at 12/08/2020 0454 Gross per 24 hour  Intake 2386.59 ml  Output 298 ml  Net 2088.59 ml   Filed Weights   12/03/20 1124 12/07/20 0458 12/08/20 0349  Weight: 95.8 kg 102 kg 103 kg    Examination: Lying flat in bed, calm cta anteriorly no w/r/r rrr s1/s2 no gallop Soft benign, +bs No edema Mood and affect appropriate in current setting     Data Reviewed: I have personally reviewed following labs and imaging studies  CBC: Recent Labs  Lab 12/03/20 1301 12/05/20 0432 12/06/20 0754 12/07/20 0730 12/08/20 0810  WBC 12.7* 12.2* 9.4 10.8* 13.3*  NEUTROABS  --  6.9  --   --   --   HGB 13.1 13.1 10.8* 9.6* 6.5*  HCT 38.4 37.9 30.8* 28.0* 19.1*  MCV 82.9 83.1 82.4 84.1 83.8  PLT 268 235 269 247 214   Basic Metabolic Panel: Recent Labs  Lab 12/03/20 1301 12/03/20 1437 12/05/20 0702 12/06/20 0505 12/06/20 1647 12/07/20 0437 12/07/20 1642 12/08/20 0501  NA 136  --   --  143  --  139  --  141  K 3.1*  --   --  3.1*  3.1*  --  3.9  --  3.3*  CL 102  --   --  114*  --  113*  --  115*  CO2 18*  --   --  19*  --  19*  --  19*  GLUCOSE 124*  --   --  94  --  161*  --  191*  BUN 29*  --   --  23  --  20  --  19  CREATININE 1.46*  --   --  1.21*  --  1.05*  --  1.35*  CALCIUM 9.7  --   --  8.8*  --  8.5*  --  8.5*  MG  --    < > 1.7  --  2.2 1.9 1.7 1.6*  PHOS  --   --  2.8  --  1.6* 2.4* 1.8* 2.0*   < > = values in this interval not displayed.   GFR: Estimated Creatinine Clearance: 48.8 mL/min (A) (by C-G formula based on SCr of 1.35 mg/dL (H)). Liver Function Tests: Recent Labs  Lab 12/03/20 1301 12/06/20 1647 12/07/20 0437 12/08/20 0501  AST 37 23 25 27   ALT 37 34 34 35  ALKPHOS 366* 239* 250* 195*  BILITOT 2.2* 1.0 0.5 0.6  PROT 6.4* 4.9* 4.9* 4.3*  ALBUMIN 2.6* 2.0* 2.0* 1.8*   Recent Labs  Lab 12/03/20 1301  LIPASE 48   No results for input(s): AMMONIA in the last 168 hours. Coagulation Profile: Recent Labs  Lab 12/06/20 1647  INR 1.3*   Cardiac  Enzymes: No results for input(s): CKTOTAL, CKMB, CKMBINDEX, TROPONINI in the last 168 hours. BNP (last 3 results) No results for input(s): PROBNP in the last 8760 hours. HbA1C: No results for input(s): HGBA1C in the last 72 hours. CBG: Recent Labs  Lab 12/07/20 1616 12/07/20 2123 12/07/20 2355 12/08/20 0425 12/08/20 0743  GLUCAP 141* 174* 182* 166* 161*   Lipid Profile: No results for input(s): CHOL, HDL, LDLCALC, TRIG, CHOLHDL, LDLDIRECT in the last 72 hours. Thyroid Function Tests: No results for input(s): TSH, T4TOTAL, FREET4, T3FREE, THYROIDAB in the last 72 hours. Anemia Panel: Recent Labs    12/06/20 0505  VITAMINB12 1,905*   Sepsis Labs: Recent Labs  Lab 12/03/20 1437 12/06/20 1647  PROCALCITON 0.17 <0.10    Recent Results (from the past 240 hour(s))  Resp Panel by RT-PCR (Flu A&B, Covid) Nasopharyngeal Swab     Status: None   Collection Time: 12/03/20  1:35 PM   Specimen: Nasopharyngeal Swab; Nasopharyngeal(NP) swabs in vial transport medium  Result Value Ref Range Status   SARS Coronavirus 2 by RT PCR NEGATIVE NEGATIVE Final    Comment: (NOTE) SARS-CoV-2 target nucleic acids are NOT DETECTED.  The SARS-CoV-2 RNA is generally detectable in upper respiratory specimens during the acute phase of infection. The lowest concentration of SARS-CoV-2 viral copies this assay can detect is 138 copies/mL. A negative result does not preclude SARS-Cov-2 infection and should not be used as the sole basis for treatment or other patient management decisions. A negative result may occur with  improper specimen collection/handling, submission of specimen other than nasopharyngeal swab, presence of viral mutation(s) within the areas targeted by this assay, and inadequate number of viral copies(<138 copies/mL). A negative result must be combined with clinical observations, patient history, and epidemiological information. The expected result is Negative.  Fact Sheet for  Patients:  12/05/20  Fact Sheet for Healthcare Providers:  BloggerCourse.com  This test is no t yet approved or cleared by the [place] FDA and  has been authorized for detection and/or diagnosis of SARS-CoV-2 by FDA under an Emergency Use Authorization (EUA). This EUA will remain  in effect (meaning this test can be used) for the duration of the COVID-19 declaration under Section 564(b)(1) of the Act, 21 U.S.C.section 360bbb-3(b)(1), unless the  authorization is terminated  or revoked sooner.       Influenza A by PCR NEGATIVE NEGATIVE Final   Influenza B by PCR NEGATIVE NEGATIVE Final    Comment: (NOTE) The Xpert Xpress SARS-CoV-2/FLU/RSV plus assay is intended as an aid in the diagnosis of influenza from Nasopharyngeal swab specimens and should not be used as a sole basis for treatment. Nasal washings and aspirates are unacceptable for Xpert Xpress SARS-CoV-2/FLU/RSV testing.  Fact Sheet for Patients: BloggerCourse.comhttps://www.fda.gov/media/152166/download  Fact Sheet for Healthcare Providers: SeriousBroker.ithttps://www.fda.gov/media/152162/download  This test is not yet approved or cleared by the [place] FDA and has been authorized for detection and/or diagnosis of SARS-CoV-2 by FDA under an Emergency Use Authorization (EUA). This EUA will remain in effect (meaning this test can be used) for the duration of the COVID-19 declaration under Section 564(b)(1) of the Act, 21 U.S.C. section 360bbb-3(b)(1), unless the authorization is terminated or revoked.  Performed at [facility], [address] [place], [place] [address]   CULTURE, BLOOD (ROUTINE X 2) w Reflex to ID Panel     Status: None (Preliminary result)   Collection Time: 12/06/20 10:55 AM   Specimen: BLOOD  Result Value Ref Range Status   Specimen Description BLOOD LEFT WRIST  Final   Special Requests   Final    BOTTLES DRAWN AEROBIC ONLY Blood Culture adequate  volume   Culture   Final    NO GROWTH 2 DAYS Performed at [facility], [address] [place], [place] [address]    Report Status PENDING  Incomplete  CULTURE, BLOOD (ROUTINE X 2) w Reflex to ID Panel     Status: None (Preliminary result)   Collection Time: 12/06/20 11:05 AM   Specimen: BLOOD  Result Value Ref Range Status   Specimen Description BLOOD RIGHT WRIST  Final   Special Requests   Final    BOTTLES DRAWN AEROBIC ONLY Blood Culture results may not be optimal due to an inadequate volume of blood received in culture bottles   Culture   Final    NO GROWTH 2 DAYS Performed at [facility], [address] [place], [place] [address]    Report Status PENDING  Incomplete  MRSA PCR Screening     Status: None   Collection Time: 12/06/20  1:18 PM   Specimen: Nasal Mucosa; Nasopharyngeal  Result Value Ref Range Status   MRSA by PCR NEGATIVE NEGATIVE Final    Comment:        The GeneXpert MRSA Assay (FDA approved for NASAL specimens only), is one component of a comprehensive MRSA colonization surveillance program. It is not intended to diagnose MRSA infection nor to guide or monitor treatment for MRSA infections. Performed at [facility], [address] [place], [place] [address]   Urine Culture     Status: None   Collection Time: 12/06/20  1:18 PM   Specimen: Urine, Random  Result Value Ref Range Status   Specimen Description   Final    URINE, RANDOM Performed at [facility], [address] [place], [place] [address]    Special Requests   Final    NONE Performed at [facility], [address] [place], [place] [address]    Culture   Final    NO GROWTH Performed at [facility], [address] [place], [place] [address]    Report Status 12/08/2020 FINAL  Final         Radiology Studies: MR BRAIN W  CONTRAST  Result Date: 12/06/2020 CLINICAL DATA:  Abnormal MRI EXAM: MRI HEAD WITH CONTRAST TECHNIQUE:  Multiplanar, multiecho pulse sequences of the brain and surrounding structures were obtained with intravenous contrast. 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 There is mild mass effect on the tectum. Chronic enlargement of the third and lateral ventricles could be related to narrowing of the third ventricle outflow. Vascular: Unremarkable. Skull and upper cervical spine: Unremarkable. Sinuses/Orbits: No new findings. Other: Expanded, "empty" sella. 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 Chronic enlargement of the third and lateral ventricles could be related to narrowing of the third ventricle outflow by above. Electronically Signed   By: Guadlupe Spanish M.D.   On: 12/06/2020 17:10   DG Chest Port 1 View  Result Date: 12/06/2020 CLINICAL DATA:  Central line adjustment EXAM: PORTABLE CHEST 1 VIEW COMPARISON:  12/06/2020 at 1244 hours FINDINGS: Left IJ approach central venous catheter is again seen projecting over the SVC with tip closely approximating the lateral wall of the SVC. Catheter appears slightly retracted relative to the previous exam. Stable heart size. Low lung volumes. Streaky bilateral airspace opacities, similar to prior. No pneumothorax. IMPRESSION: 1. Left IJ approach central venous catheter again seen with tip closely approximating the lateral wall of the SVC. Consider repositioning. 2. No pneumothorax. Electronically Signed   By: Duanne Guess D.O.   On: 12/06/2020 15:43   DG Chest Port 1 View  Result Date: 12/06/2020 CLINICAL DATA:  Assess central line placement. EXAM: PORTABLE CHEST 1 VIEW COMPARISON:  Chest radiograph December 06, 2020 FINDINGS: Left IJ central venous catheter with tip overlying the SVC, of note the tip of the catheter is oriented  against the sidewall of the SVC which has been reported to potentially induce vascular injury. The heart size and mediastinal contours are unchanged. Similar diffuse airspace opacities. No significant pleural effusion or visible pneumothorax. The visualized skeletal structures are unchanged. IMPRESSION: Left IJ central venous catheter with tip overlying the SVC. Of note the tip of the catheter is oriented against the sidewall of the SVC which has been reported to potentially induce vascular injury, repositioning/advancement is suggested. No visible pneumothorax. Electronically Signed   By: Maudry Mayhew MD   On: 12/06/2020 13:13        Scheduled Meds: . Chlorhexidine Gluconate Cloth  6 each Topical Daily  . diclofenac Sodium  2 g Topical QID  . insulin aspart  0-9 Units Subcutaneous Q4H  . metoprolol succinate  25 mg Oral Daily  . mirtazapine  30 mg Oral QHS  . OLANZapine zydis  5 mg Oral QHS   Continuous Infusions: . sodium chloride 100 mL/hr at 12/08/20 0656  . amiodarone 30 mg/hr (12/08/20 0021)  . dextrose 5% lactated ringers 125 mL/hr at 12/08/20 0454  . heparin 450 Units/hr (12/07/20 1917)    Assessment & Plan:   Principal Problem:   Depression Active Problems:   AKI (acute kidney injury) (HCC)   Adult failure to thrive   Hypokalemia   Weight loss   Altered mental status   Tachycardia   Hypotension   Chelsea Thompsonis a 65 y.o.African-American femalewith medical history significant forosteoarthritis, type 2 diabetes mellitus and hypertension, as well as COVID-19 in September 2021, who presented to the emergency room with acute onset of altered mental status and not acting herself for  the last 4 days with excessive sleepiness.She has been having nausea and dry heaves without abdominal pain. Her daughter stated that she has not eaten much and has been bed bound since September 2021 since her COVID-19 infection.    Adult failure to thrive/poor PO  intake/generalized deconditioning Hypokalemia/depressed mood -- patient came in with ongoing weight loss and poor PO intake per daughter. Since COVID infection in September 2021 per daughter patient has been going downhill. --She was admitted at [facility] with adult failure to thrive and poor PO intake. Patient also went to rehab in [place]. -- She recently was seen in [facility] emergency room on 10th of February she was discharged to go home with home health and daughter thinking patient will hopefully do better with her mood and eating. -- Daughter says patient has lost more than hundred pounds since September 2021. She has not ambulated and no interest in any acitivity. Stays in bed most of the time --she is requesting if patient can get a feeding tube -- dietitian consult-- for PEG/NG feeding -- speech therapy-- patient showed very little interest in participating with speech therapy. Pt has Dsyphagia and at risk for aspiration. --spoke with IR RN regarding PICC line and G-tube/peg tube placement  --discussed with daughter risk and complications of feeding tube, she voiced understanding patient's daughter will sign the consent. --3/1-- patient is tachycardic despite IV fluids and IV metoprolol 5 mg x1 dose soft blood pressure and continued encephalopathy. Will's transfer her to ICU --spoke with ICU NP Jeri Modena to follow pt.--pt will need Central line due to poor IV access. 3/2-TRH pickup.  3/3- Plan for IR for Peg  Placement in am   Hypotension with sinus tachycardia, acute renal failure hypokalemia AKI improved with ivf...>up mildly again at 1.35, possibly prerenal due to hypotension 3/3-still hypotensive Will give bolus ivf 250 Hg low today, need to r/o GIB, ck stool occult...>?cause of hypotension Replace lytes   Atrial fib-rvr Was started on amiodarone infusion .  Cards following Now converted to NSR 3/3-dc heparin gtt as hg dropped significantly,  repeat hg still low. Hold toradol. Ck stool occult blood, if negative then will resume Hold beta blk. Due to hypotension  Acute drop in H/H-initial hemoglobin this a.m. was 6.5 from 9.6.  Repeat is 7.4. Will hold Toradol, heparin drip Check stool occult to rule out GI bleed Transfuse if hg 7 or >. Discussed this with pt's daughter as we need consent, discussed complications of transfusion including transfusion reaction and even infection, she verbalized an understanding. Hold beta blk due to hypotension   Decrease Urine Output- Nephrology consulted Creatinine 1.35 today NPO currently On IVF Nephrologist input was appreciated. 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 Neuro- recommends discussed with neurosurgery. --3/1-- per Dr Adriana Simas "This looks rather stable compared to CT from previous. This is likely benign, possible even cystic. She would need a contrasted image to delineate. No urgent intervention needed as we know it has been there for some time. Unrelated to current issues and wont cause encephalopathy  3/2-f/u neurosurgery as outpt to discuss options going forward once she recovers from acute illness. No intervention recommended at this time.    Abnormal ultrasound abdomen with gallbladder distention similar to previous imaging studies -- clinically patient does not  appear to be septic or having any infection -- discontinue IV antibiotics-- no source of infection noted so far, chest x-ray negative for pneumonia. Appears to have some opacities from previous COVID infection.sats are more than 92% on room air 3/2-surgery Dr. Lady Gary - no surgical indication at present for gallbladder. Labs stable.     Depressed mood -- continue Cymbalta , remeron --  psych consult with Dr.  Toni Amend.   -- patient has appointment for geriatric evaluation at [facility] per daughter in April 2022  Type II diabetes with sugars controlled -- sliding scale insulin -- hold PO diabetes meds  History of tachycardia/SVT -- patient follows with cardiology at [facility] -- recently started on Toprol-XL hundred milligrams daily. With soft blood pressure decreased dose to 25 mg daily..>will dc as bp low  Generalized deconditioning -- PT evaluation noted--rec SNF  Palliative following   DVT prophylaxis: scd Code Status:full Family Communication: daughter at bedside.   Status is: Inpatient  Patient is inpatient due to her severity of illness requiring hospitalization and needing IV treatment Dispo: The patient is from: Home              Anticipated d/c is to: SNF              Patient currently is not medically stable to d/c.   Difficult to place patient No  Pt still needing acute care, getting peg placement, pt current npo, no NGT ,  needs snf. D/c likely >3 days            LOS: 5 days   Time spent: 45 min with >50% on coc    Lynn Ito, MD [facility] Pager [phone]  If 7PM-7AM, please contact night-coverage 12/08/2020, 9:01 AM

## 2020-12-08 NOTE — Care Management Important Message (Signed)
Important Message  Patient Details  Name: Chelsea Glass MRN: [id number] Date of Birth: 12-22-1955   Medicare Important Message Given:  Yes     Johnell Comings 12/08/2020, 2:20 PM

## 2020-12-08 NOTE — TOC Progression Note (Signed)
Transition of Care [facility]) - Progression Note    Patient Details  Name: Chelsea Glass MRN: [id number] Date of Birth: 10-14-1955  Transition of Care [facility] CM/SW Contact  Maree Krabbe, LCSW Phone Number: 12/08/2020, 1:48 PM  Clinical Narrative:   Pt will dc home with home health ([facility]) when stable.    Expected Discharge Plan: Home w Home Health Services Barriers to Discharge: Continued Medical Work up  Expected Discharge Plan and Services Expected Discharge Plan: Home w Home Health Services   Discharge Planning Services: CM Consult   Living arrangements for the past 2 months: Single Family Home                           HH Arranged: PT [facility] Agency: [facility] Date [facility] Agency Contacted: 12/05/20   Representative spoke with at [facility] Agency: Elnita Maxwell   Social Determinants of Health (SDOH) Interventions    Readmission Risk Interventions No flowsheet data found.

## 2020-12-08 NOTE — Progress Notes (Addendum)
Physical Therapy Treatment Patient Details Name: Chelsea Glass MRN: [id number] DOB: 03-Sep-1956 Today's Date: 12/08/2020    History of Present Illness Pt is a 65 y/o F admitted from home on 12/03/20 with c/c of AMS, excessive sleepiness, nausea & dry heaves. Pt currently being treated for aspiration bibasal PNA & gallbladder sludge with associated nausea & dry heaves. PMH: OA, DM2, HTN, Covid 19 in September 2021    PT Comments    PT treatment completed. Patient lethargic but opens eyes for most of session. Patient is able to follow single step commands inconsistently with extra time. Patient has right head/neck turn preference, but is able to turn head to left side with maximal prompting including identifying  objects on left side of room correctly. Encouraged daughter to sit on the left side of the bed intermittently to promote more neutral head position and looking to left side. Minimal effort proved by patient with bed mobility efforts. Therapist sat the bed up incrementally to eventually achieve chair position in the bed to monitor response to more upright activity. Sp02 97% on room air and heart rate 57bpm. Patient participated some with LE therapeutic exercises and initiates movement in BUE to reach across midline to left and right. Total assistance is still required for repositioning in bed. Patient also with tendency to have right trunk lean in chair position in bed, unable to correct to midline without total assistance despite cueing. Recommend to continue PT to maximize independence and address remaining functional limitations.    Follow Up Recommendations  SNF     Equipment Recommendations   (to be determined at next level of care)    Recommendations for Other Services OT consult     Precautions / Restrictions Precautions Precautions: Fall Restrictions Weight Bearing Restrictions: No    Mobility  Bed Mobility Overal bed mobility: Needs Assistance              General bed mobility comments: no initiation with bed mobility efforts. sat the head of bed up for increased activity and upright conditioning. patient was able to sit upright in chair position for ~ 12 minutes with pain reported intermittently in legs with movement. patient would need lift to get OOB to chair safely at this time. Total assistance for repositioning in bed     Transfers                    Ambulation/Gait                 Stairs             Wheelchair Mobility    Modified Rankin (Stroke Patients Only)       Balance                                            Cognition Arousal/Alertness: Lethargic Behavior During Therapy: WFL for tasks assessed/performed                                   General Comments: patient with minimal verbalizations during session. patient is able to follow single step commands inconsistently with extra time and repetition. slow to respond overall with increased time for motor planning.      Exercises General Exercises - Lower Extremity Ankle Circles/Pumps: AROM;Strengthening;Both;10 reps;Supine (AROM through partial ROM)  Short Arc Quad: AAROM;Strengthening;Both;5 reps;Supine Heel Slides: AAROM;Strengthening;Both;10 reps;Supine Other Exercises Other Exercises: verbal and tactile cues for technique and initiation. patient particpates intermittently with active LE movement.    General Comments        Pertinent Vitals/Pain Pain Assessment: Faces Faces Pain Scale: Hurts a little bit Pain Location: bilateral legs Pain Descriptors / Indicators: Grimacing Pain Intervention(s): Limited activity within patient's tolerance    Home Living                      Prior Function            PT Goals (current goals can now be found in the care plan section) Acute Rehab PT Goals Patient Stated Goal: none stated PT Goal Formulation: Patient unable to participate in goal  setting Time For Goal Achievement: 12/18/20 Progress towards PT goals: Progressing toward goals    Frequency    Min 2X/week      PT Plan Current plan remains appropriate    Co-evaluation              AM-PAC PT "6 Clicks" Mobility   Outcome Measure  Help needed turning from your back to your side while in a flat bed without using bedrails?: Total Help needed moving from lying on your back to sitting on the side of a flat bed without using bedrails?: Total Help needed moving to and from a bed to a chair (including a wheelchair)?: Total Help needed standing up from a chair using your arms (e.g., wheelchair or bedside chair)?: Total Help needed to walk in hospital room?: Total Help needed climbing 3-5 steps with a railing? : Total 6 Click Score: 6    End of Session   Activity Tolerance: Patient limited by lethargy Patient left: in bed;with call bell/phone within reach;with bed alarm set;with family/visitor present (daughter at the bedside) Nurse Communication: Mobility status PT Visit Diagnosis: Muscle weakness (generalized) (M62.81);Difficulty in walking, not elsewhere classified (R26.2)     Time: 2952-8413 PT Time Calculation (min) (ACUTE ONLY): 31 min  Charges:  $Therapeutic Exercise: 8-22 mins $Therapeutic Activity: 8-22 mins                     Donna Bernard, PT, MPT    Ina Homes 12/08/2020, 4:04 PM

## 2020-12-08 NOTE — Progress Notes (Addendum)
SUBJECTIVE: Patient resting in bed. Patient continues to be nonverbal but shook her head no to chest pain or shortness of breath.   Vitals:   12/07/20 2240 12/08/20 0027 12/08/20 0349 12/08/20 0746  BP: 93/79 102/80 98/68 91/60   Pulse:  90 87 84  Resp:   15 16  Temp:   97.6 F (36.4 C) 97.9 F (36.6 C)  TempSrc:   Axillary Axillary  SpO2:   94% 95%  Weight:   103 kg   Height:        Intake/Output Summary (Last 24 hours) at 12/08/2020 1032 Last data filed at 12/08/2020 0454 Gross per 24 hour  Intake 2386.59 ml  Output 298 ml  Net 2088.59 ml    LABS: Basic Metabolic Panel: Recent Labs    12/07/20 0437 12/07/20 1642 12/08/20 0501  NA 139  --  141  K 3.9  --  3.3*  CL 113*  --  115*  CO2 19*  --  19*  GLUCOSE 161*  --  191*  BUN 20  --  19  CREATININE 1.05*  --  1.35*  CALCIUM 8.5*  --  8.5*  MG 1.9 1.7 1.6*  PHOS 2.4* 1.8* 2.0*   Liver Function Tests: Recent Labs    12/07/20 0437 12/08/20 0501  AST 25 27  ALT 34 35  ALKPHOS 250* 195*  BILITOT 0.5 0.6  PROT 4.9* 4.3*  ALBUMIN 2.0* 1.8*   No results for input(s): LIPASE, AMYLASE in the last 72 hours. CBC: Recent Labs    12/07/20 0730 12/08/20 0810 12/08/20 0930  WBC 10.8* 13.3*  --   HGB 9.6* 6.5* 7.4*  HCT 28.0* 19.1* 21.9*  MCV 84.1 83.8  --   PLT 247 214  --    Cardiac Enzymes: No results for input(s): CKTOTAL, CKMB, CKMBINDEX, TROPONINI in the last 72 hours. BNP: Invalid input(s): POCBNP D-Dimer: Recent Labs    12/06/20 0754  DDIMER 1.48*   Hemoglobin A1C: No results for input(s): HGBA1C in the last 72 hours. Fasting Lipid Panel: No results for input(s): CHOL, HDL, LDLCALC, TRIG, CHOLHDL, LDLDIRECT in the last 72 hours. Thyroid Function Tests: No results for input(s): TSH, T4TOTAL, T3FREE, THYROIDAB in the last 72 hours.  Invalid input(s): FREET3 Anemia Panel: Recent Labs    12/06/20 0505  VITAMINB12 1,905*     PHYSICAL EXAM General: Well developed, well nourished, in no acute  distress HEENT:  Normocephalic and atramatic Neck:  No JVD.  Lungs: Clear bilaterally to auscultation and percussion. Heart: HRRR . Normal S1 and S2 without gallops or murmurs.  Abdomen: Bowel sounds are positive, abdomen soft and non-tender  Msk:  Back normal, normal gait. Normal strength and tone for age. Extremities: No clubbing, cyanosis or edema.   Neuro: Nonverbal Psych:  Nonverbal  TELEMETRY: NSR. 72/BPM  ASSESSMENT AND PLAN: Patient presenting to the emergency department with altered mental status later developed most likely aspiration pneumonia and atrial fibrillation with RVR.  Patient remains in NSR with continued amiodarone infusion as NG tube placement has been deferred. Will check echo. Blood pressure remained stable. Significant drop in hemoglobin. No overt signs of bleeding. Recommend stopping heparin for now. Restart when able.  Will continue to follow.  Principal Problem:   Depression Active Problems:   AKI (acute kidney injury) (HCC)   Adult failure to thrive   Hypokalemia   Weight loss   Altered mental status   Tachycardia   Hypotension    Maryelizabeth Kaufmann, NP-C 12/08/2020 10:32 AM

## 2020-12-08 NOTE — Progress Notes (Signed)
ANTICOAGULATION CONSULT NOTE  Pharmacy Consult for heparin Indication: atrial fibrillation  Patient Measurements: Heparin Dosing Weight: 78 kg  Labs: Recent Labs     0000 12/06/20 0505 12/06/20 0754 12/06/20 1647 12/06/20 2325 12/07/20 0437 12/07/20 0730 12/07/20 1642 12/08/20 0205 12/08/20 0501 12/08/20 0810 12/08/20 0930  HGB   < >  --  10.8*  --   --   --  9.6*  --   --   --  6.5* 7.4*  HCT   < >  --  30.8*  --   --   --  28.0*  --   --   --  19.1* 21.9*  PLT  --   --  269  --   --   --  247  --   --   --  214  --   APTT  --   --   --  37*  --   --   --   --   --   --   --   --   LABPROT  --   --   --  15.8*  --   --   --   --   --   --   --   --   INR  --   --   --  1.3*  --   --   --   --   --   --   --   --   HEPARINUNFRC  --   --   --   --    < >  --  1.41* 1.02* 0.60  --  0.37  --   CREATININE  --  1.21*  --   --   --  1.05*  --   --   --  1.35*  --   --   TROPONINIHS  --   --   --  9  --  11  --   --   --   --   --   --    < > = values in this interval not displayed.    Estimated Creatinine Clearance: 48.8 mL/min (A) (by C-G formula based on SCr of 1.35 mg/dL (H)).   Medical History: Past Medical History:  Diagnosis Date  . Arthritis   . Diabetes mellitus without complication (HCC)   . Hypertension     Assessment: 65 year old female transferred to the ICU 3/1. Patient with generalized deconditioning and failure to thrive. Dysphagia limiting safe PO intake, plan for G-tube placement. Patient with afib with RVR. Patient to start on amiodarone. Consult for heparin drip for CHA2DS2VASc 4.  Goal of Therapy:  Heparin level 0.3-0.7 units/ml Monitor platelets by anticoagulation protocol: Yes   3/1 2325 HL 1.92, supratherapeutic 3/2 0730 HL 1.41, supratherapeutic, Heparin held for one hour. Restarted at 800 units/hr 3/2 1642 HL 1.02, supreatherapeutic 3/3 0205 HL 0.60, therapeutic x 1 3/3 0810 HL 0.37, therapeutic x 2  Plan:  --Heparin drip discontinued 3/3  AM given acute drop in H&H --Heparin level therapeutic x 2 on rate of 450 units/hr. Level drawn prior to charted stop on MAR --Continue to follow anticoagulation plan  Tressie Ellis 12/08/2020,10:33 AM

## 2020-12-08 NOTE — Progress Notes (Signed)
ANTICOAGULATION CONSULT NOTE  Pharmacy Consult for heparin Indication: atrial fibrillation  Allergies  Allergen Reactions  . Lisinopril Swelling    Patient Measurements: Height: 5\' 5"  (165.1 cm) Weight: 102 kg (224 lb 13.9 oz) IBW/kg (Calculated) : 57 Heparin Dosing Weight: 78 kg  Vital Signs: Temp: 97.4 F (36.3 C) (03/02 2112) Temp Source: Oral (03/02 2112) BP: 102/80 (03/03 0027) Pulse Rate: 90 (03/03 0027)  Labs: Recent Labs    12/05/20 0432 12/06/20 0505 12/06/20 0754 12/06/20 1647 12/06/20 2325 12/07/20 0437 12/07/20 0730 12/07/20 1642 12/08/20 0205  HGB 13.1  --  10.8*  --   --   --  9.6*  --   --   HCT 37.9  --  30.8*  --   --   --  28.0*  --   --   PLT 235  --  269  --   --   --  247  --   --   APTT  --   --   --  37*  --   --   --   --   --   LABPROT  --   --   --  15.8*  --   --   --   --   --   INR  --   --   --  1.3*  --   --   --   --   --   HEPARINUNFRC  --   --   --   --    < >  --  1.41* 1.02* 0.60  CREATININE  --  1.21*  --   --   --  1.05*  --   --   --   TROPONINIHS  --   --   --  9  --  11  --   --   --    < > = values in this interval not displayed.    Estimated Creatinine Clearance: 62.4 mL/min (A) (by C-G formula based on SCr of 1.05 mg/dL (H)).   Medical History: Past Medical History:  Diagnosis Date  . Arthritis   . Diabetes mellitus without complication (HCC)   . Hypertension     Assessment: 65 year old female transferred to the ICU 3/1. Patient with generalized deconditioning and failure to thrive. Dysphagia limiting safe PO intake, plan for G-tube placement. Patient with afib with RVR. Patient to start on amiodarone. Consult for heparin drip for CHA2DS2VASc 4.  Goal of Therapy:  Heparin level 0.3-0.7 units/ml Monitor platelets by anticoagulation protocol: Yes   3/1 2325 HL 1.92, supratherapeutic 3/2 0730 HL 1.41, supratherapeutic, Heparin held for one hour. Restarted at 800 units/hr 3/2 1642 HL 1.02,  supreatherapeutic 3/3 0205 HL 0.60, therapeutic x 1  Plan:  Heparin level therapeutic. Will continue Heparin at 450 units/hr.  Recheck HL in 6 hours to confirm.   CBC daily while on heparin drip.  69, PharmD Clinical Pharmacist 12/08/2020,3:41 AM

## 2020-12-08 NOTE — Progress Notes (Signed)
*  PRELIMINARY RESULTS* Echocardiogram 2D Echocardiogram has been performed.  Chelsea Glass M Marjie Chea 12/08/2020, 1:23 PM 

## 2020-12-08 NOTE — Progress Notes (Signed)
PHARMACY CONSULT NOTE  Pharmacy Consult for Electrolyte Monitoring and Replacement   Recent Labs: Potassium (mmol/L)  Date Value  12/08/2020 3.3 (L)   Magnesium (mg/dL)  Date Value  93/81/0175 1.6 (L)   Calcium (mg/dL)  Date Value  08/01/8526 8.5 (L)   Albumin (g/dL)  Date Value  78/24/2353 1.8 (L)   Phosphorus (mg/dL)  Date Value  61/44/3154 2.0 (L)   Sodium (mmol/L)  Date Value  12/08/2020 141   Assessment: 65 year old female transferred to the ICU 3/1. Patient with generalized deconditioning and failure to thrive. Dysphagia limiting safe PO intake, plan for G-tube placement. Pharmacy consult for electrolyte replacement.  Access: CVC triple lumen in LIJ Diet: NPO, pending G-tube placement MIVF: NS at 100 mL/hr  Expect patient will be at risk for re-feeding when enteral access is established and feeds are started  Goal of Therapy:  Electrolytes WNL  Plan:  3/3 AM labs: Na 141, K 3.3, Cl 115, Co2 19, Phos 2, Mg 1.6, Scr 1.35  --IV potassium phosphate 15 mmol x 1 (contains 22.5 mEq K+) --IV magnesium sulfate 2 g x 1 --No further electrolyte replacement warranted at this time --Will follow-up electrolytes with AM labs   Tressie Ellis 12/08/2020 9:26 AM

## 2020-12-08 NOTE — Consult Note (Signed)
[facility] Face-to-Face Psychiatry Consult   Reason for Consult: Consult follow-up for 65 year old woman with depression and multiple medical problems Referring Physician: Amery Patient Identification: Chelsea Glass MRN:  [id number] Principal Diagnosis: Depression Diagnosis:  Principal Problem:   Depression Active Problems:   AKI (acute kidney injury) (HCC)   Adult failure to thrive   Hypokalemia   Weight loss   Altered mental status   Tachycardia   Hypotension   Total Time spent with patient: 30 minutes  Subjective:   Chelsea Glass is a 65 y.o. female patient admitted with patient nonverbal.  HPI: Patient seen chart reviewed.  Patient responded to my questions with humming sounds that roughly could be distinguished as yeses her nose.  Opened her eyes very slightly.  Did not vocalize anything and answered any questions.  Indicated that she is still having pain.  Indicated that she still has not had anything to eat today.  Past Psychiatric History: Past history of what seems to be depression in addition to other problems since her COVID.  Risk to Self:   Risk to Others:   Prior Inpatient Therapy:   Prior Outpatient Therapy:    Past Medical History:  Past Medical History:  Diagnosis Date  . Arthritis   . Diabetes mellitus without complication (HCC)   . Hypertension     Past Surgical History:  Procedure Laterality Date  . TUBAL LIGATION    . UTERINE FIBROID SURGERY     Family History: History reviewed. No pertinent family history. Family Psychiatric  History: See prior Social History:  Social History   Substance and Sexual Activity  Alcohol Use No     Social History   Substance and Sexual Activity  Drug Use No    Social History   Socioeconomic History  . Marital status: Widowed    Spouse name: Not on file  . Number of children: Not on file  . Years of education: Not on file  . Highest education level: Not on file  Occupational History  . Not on file   Tobacco Use  . Smoking status: Never Smoker  . Smokeless tobacco: Never Used  Substance and Sexual Activity  . Alcohol use: No  . Drug use: No  . Sexual activity: Not on file  Other Topics Concern  . Not on file  Social History Narrative  . Not on file   Social Determinants of Health   Financial Resource Strain: Not on file  Food Insecurity: Not on file  Transportation Needs: Not on file  Physical Activity: Not on file  Stress: Not on file  Social Connections: Not on file   Additional Social History:    Allergies:   Allergies  Allergen Reactions  . Lisinopril Swelling    Labs:  Results for orders placed or performed during the hospital encounter of 12/03/20 (from the past 48 hour(s))  Glucose, capillary     Status: Abnormal   Collection Time: 12/06/20  7:39 PM  Result Value Ref Range   Glucose-Capillary 137 (H) 70 - 99 mg/dL    Comment: Glucose reference range applies only to samples taken after fasting for at least 8 hours.  Heparin level (unfractionated)     Status: Abnormal   Collection Time: 12/06/20 11:25 PM  Result Value Ref Range   Heparin Unfractionated 1.92 (H) 0.30 - 0.70 IU/mL    Comment: RESULTS CONFIRMED BY MANUAL DILUTION (NOTE) If heparin results are below expected values, and patient dosage has  been confirmed, suggest follow up testing of  antithrombin III levels. Performed at [facility], [address] [place], [place] [address]   Glucose, capillary     Status: Abnormal   Collection Time: 12/06/20 11:35 PM  Result Value Ref Range   Glucose-Capillary 145 (H) 70 - 99 mg/dL    Comment: Glucose reference range applies only to samples taken after fasting for at least 8 hours.  Glucose, capillary     Status: Abnormal   Collection Time: 12/07/20  4:05 AM  Result Value Ref Range   Glucose-Capillary 150 (H) 70 - 99 mg/dL    Comment: Glucose reference range applies only to samples taken after fasting for at least 8 hours.  Comprehensive  metabolic panel     Status: Abnormal   Collection Time: 12/07/20  4:37 AM  Result Value Ref Range   Sodium 139 135 - 145 mmol/L   Potassium 3.9 3.5 - 5.1 mmol/L   Chloride 113 (H) 98 - 111 mmol/L   CO2 19 (L) 22 - 32 mmol/L   Glucose, Bld 161 (H) 70 - 99 mg/dL    Comment: Glucose reference range applies only to samples taken after fasting for at least 8 hours.   BUN 20 8 - 23 mg/dL   Creatinine, Ser 6.04 (H) 0.44 - 1.00 mg/dL   Calcium 8.5 (L) 8.9 - 10.3 mg/dL   Total Protein 4.9 (L) 6.5 - 8.1 g/dL   Albumin 2.0 (L) 3.5 - 5.0 g/dL   AST 25 15 - 41 U/L   ALT 34 0 - 44 U/L   Alkaline Phosphatase 250 (H) 38 - 126 U/L   Total Bilirubin 0.5 0.3 - 1.2 mg/dL   GFR, Estimated 59 (L) >60 mL/min    Comment: (NOTE) Calculated using the CKD-EPI Creatinine Equation (2021)    Anion gap 7 5 - 15    Comment: Performed at [facility], [address], [place], [place] [address]  Magnesium     Status: None   Collection Time: 12/07/20  4:37 AM  Result Value Ref Range   Magnesium 1.9 1.7 - 2.4 mg/dL    Comment: Performed at [facility], [address] [place], [place] [address]  Phosphorus     Status: Abnormal   Collection Time: 12/07/20  4:37 AM  Result Value Ref Range   Phosphorus 2.4 (L) 2.5 - 4.6 mg/dL    Comment: Performed at [facility], [address] [place], [place] [address]  Troponin I (High Sensitivity)     Status: None   Collection Time: 12/07/20  4:37 AM  Result Value Ref Range   Troponin I (High Sensitivity) 11 <18 ng/L    Comment: (NOTE) Elevated high sensitivity troponin I (hsTnI) values and significant  changes across serial measurements may suggest ACS but many other  chronic and acute conditions are known to elevate hsTnI results.  Refer to the "Links" section for chest pain algorithms and additional  guidance. Performed at [facility], [address] [place], [place] [address]   Glucose, capillary     Status: Abnormal    Collection Time: 12/07/20  7:25 AM  Result Value Ref Range   Glucose-Capillary 140 (H) 70 - 99 mg/dL    Comment: Glucose reference range applies only to samples taken after fasting for at least 8 hours.  Heparin level (unfractionated)     Status: Abnormal   Collection Time: 12/07/20  7:30 AM  Result Value Ref Range   Heparin Unfractionated 1.41 (H) 0.30 - 0.70 IU/mL  Comment: (NOTE) If heparin results are below expected values, and patient dosage has  been confirmed, suggest follow up testing of antithrombin III levels. Performed at [facility], [address] [place], [place] [address]   CBC     Status: Abnormal   Collection Time: 12/07/20  7:30 AM  Result Value Ref Range   WBC 10.8 (H) 4.0 - 10.5 K/uL   RBC 3.33 (L) 3.87 - 5.11 MIL/uL   Hemoglobin 9.6 (L) 12.0 - 15.0 g/dL   HCT 70.0 (L) 17.4 - 94.4 %   MCV 84.1 80.0 - 100.0 fL   MCH 28.8 26.0 - 34.0 pg   MCHC 34.3 30.0 - 36.0 g/dL   RDW 96.7 (H) 59.1 - 63.8 %   Platelets 247 150 - 400 K/uL   nRBC 0.0 0.0 - 0.2 %    Comment: Performed at [facility], [address] [place], [place] [address]  Glucose, capillary     Status: Abnormal   Collection Time: 12/07/20 11:30 AM  Result Value Ref Range   Glucose-Capillary 161 (H) 70 - 99 mg/dL    Comment: Glucose reference range applies only to samples taken after fasting for at least 8 hours.  Glucose, capillary     Status: Abnormal   Collection Time: 12/07/20  4:16 PM  Result Value Ref Range   Glucose-Capillary 141 (H) 70 - 99 mg/dL    Comment: Glucose reference range applies only to samples taken after fasting for at least 8 hours.  Magnesium     Status: None   Collection Time: 12/07/20  4:42 PM  Result Value Ref Range   Magnesium 1.7 1.7 - 2.4 mg/dL    Comment: Performed at [facility], [address] [place], [place] [address]  Phosphorus     Status: Abnormal   Collection Time: 12/07/20  4:42 PM  Result Value Ref Range   Phosphorus 1.8  (L) 2.5 - 4.6 mg/dL    Comment: Performed at [facility], [address] [place], [place] [address]  Heparin level (unfractionated)     Status: Abnormal   Collection Time: 12/07/20  4:42 PM  Result Value Ref Range   Heparin Unfractionated 1.02 (H) 0.30 - 0.70 IU/mL    Comment: RESULTS CONFIRMED BY MANUAL DILUTION IF HEPARIN RESULTS ARE BELOW EXPECTED VALUES, AND PATIENT DOSAGE HAS BEEN CONFIRMED, SUGGEST FOLLOW UP TESTING OF ANTITHROMBIN III LEVELS. (NOTE) If heparin results are below expected values, and patient dosage has  been confirmed, suggest follow up testing of antithrombin III levels. Performed at [facility], [address] [place], [place] [address]   Glucose, capillary     Status: Abnormal   Collection Time: 12/07/20  9:23 PM  Result Value Ref Range   Glucose-Capillary 174 (H) 70 - 99 mg/dL    Comment: Glucose reference range applies only to samples taken after fasting for at least 8 hours.  Glucose, capillary     Status: Abnormal   Collection Time: 12/07/20 11:55 PM  Result Value Ref Range   Glucose-Capillary 182 (H) 70 - 99 mg/dL    Comment: Glucose reference range applies only to samples taken after fasting for at least 8 hours.  Heparin level (unfractionated)     Status: None   Collection Time: 12/08/20  2:05 AM  Result Value Ref Range   Heparin Unfractionated 0.60 0.30 - 0.70 IU/mL    Comment: (NOTE) If heparin results are below expected values, and patient dosage has  been confirmed, suggest  follow up testing of antithrombin III levels. Performed at [facility], [address] [place], [place] [address]   Glucose, capillary     Status: Abnormal   Collection Time: 12/08/20  4:25 AM  Result Value Ref Range   Glucose-Capillary 166 (H) 70 - 99 mg/dL    Comment: Glucose reference range applies only to samples taken after fasting for at least 8 hours.  Comprehensive metabolic panel     Status: Abnormal   Collection Time:  12/08/20  5:01 AM  Result Value Ref Range   Sodium 141 135 - 145 mmol/L   Potassium 3.3 (L) 3.5 - 5.1 mmol/L   Chloride 115 (H) 98 - 111 mmol/L   CO2 19 (L) 22 - 32 mmol/L   Glucose, Bld 191 (H) 70 - 99 mg/dL    Comment: Glucose reference range applies only to samples taken after fasting for at least 8 hours.   BUN 19 8 - 23 mg/dL   Creatinine, Ser 9.38 (H) 0.44 - 1.00 mg/dL   Calcium 8.5 (L) 8.9 - 10.3 mg/dL   Total Protein 4.3 (L) 6.5 - 8.1 g/dL   Albumin 1.8 (L) 3.5 - 5.0 g/dL   AST 27 15 - 41 U/L   ALT 35 0 - 44 U/L   Alkaline Phosphatase 195 (H) 38 - 126 U/L   Total Bilirubin 0.6 0.3 - 1.2 mg/dL   GFR, Estimated 43 (L) >60 mL/min    Comment: (NOTE) Calculated using the CKD-EPI Creatinine Equation (2021)    Anion gap 7 5 - 15    Comment: Performed at [facility], [address], [place], [place] [address]  Magnesium     Status: Abnormal   Collection Time: 12/08/20  5:01 AM  Result Value Ref Range   Magnesium 1.6 (L) 1.7 - 2.4 mg/dL    Comment: Performed at [facility], [address] [place], [place] [address]  Phosphorus     Status: Abnormal   Collection Time: 12/08/20  5:01 AM  Result Value Ref Range   Phosphorus 2.0 (L) 2.5 - 4.6 mg/dL    Comment: Performed at [facility], [address] [place], [place] [address]  Glucose, capillary     Status: Abnormal   Collection Time: 12/08/20  7:43 AM  Result Value Ref Range   Glucose-Capillary 161 (H) 70 - 99 mg/dL    Comment: Glucose reference range applies only to samples taken after fasting for at least 8 hours.  CBC     Status: Abnormal   Collection Time: 12/08/20  8:10 AM  Result Value Ref Range   WBC 13.3 (H) 4.0 - 10.5 K/uL   RBC 2.28 (L) 3.87 - 5.11 MIL/uL   Hemoglobin 6.5 (L) 12.0 - 15.0 g/dL    Comment: REPEATED TO VERIFY   HCT 19.1 (L) 36.0 - 46.0 %   MCV 83.8 80.0 - 100.0 fL   MCH 28.5 26.0 - 34.0 pg   MCHC 34.0 30.0 - 36.0 g/dL   RDW 81.0 (H) 17.5 - 10.2 %   Platelets 214  150 - 400 K/uL   nRBC 0.2 0.0 - 0.2 %    Comment: Performed at [facility], [address] [place], [place] [address]  Heparin level (unfractionated)     Status: None   Collection Time: 12/08/20  8:10 AM  Result Value Ref Range   Heparin Unfractionated 0.37 0.30 - 0.70 IU/mL    Comment: (NOTE) If heparin results are below expected values,  and patient dosage has  been confirmed, suggest follow up testing of antithrombin III levels. Performed at [facility], [address] [place], [place] [address]   Hemoglobin and hematocrit, blood     Status: Abnormal   Collection Time: 12/08/20  9:30 AM  Result Value Ref Range   Hemoglobin 7.4 (L) 12.0 - 15.0 g/dL   HCT 91.4 (L) 78.2 - 95.6 %    Comment: Performed at [facility], [address] [place], [place] [address]  Glucose, capillary     Status: Abnormal   Collection Time: 12/08/20 12:02 PM  Result Value Ref Range   Glucose-Capillary 141 (H) 70 - 99 mg/dL    Comment: Glucose reference range applies only to samples taken after fasting for at least 8 hours.  Glucose, capillary     Status: Abnormal   Collection Time: 12/08/20  4:31 PM  Result Value Ref Range   Glucose-Capillary 163 (H) 70 - 99 mg/dL    Comment: Glucose reference range applies only to samples taken after fasting for at least 8 hours.    Current Facility-Administered Medications  Medication Dose Route Frequency Provider Last Rate Last Admin  . acetaminophen (TYLENOL) tablet 650 mg  650 mg Oral Q6H PRN Mansy, Jan A, MD       Or  . acetaminophen (TYLENOL) suppository 650 mg  650 mg Rectal Q6H PRN Mansy, Vernetta Honey, MD   650 mg at 12/07/20 0454  . acetaminophen (TYLENOL) tablet 650 mg  650 mg Oral Q6H PRN Lynn Ito, MD      . amiodarone (NEXTERONE PREMIX) 360-4.14 MG/200ML-% (1.8 mg/mL) IV infusion  30 mg/hr Intravenous Continuous Maryelizabeth Kaufmann, NP 16.67 mL/hr at 12/08/20 1215 30 mg/hr at 12/08/20 1215  . Chlorhexidine Gluconate Cloth 2 %  PADS 6 each  6 each Topical Daily Enedina Finner, MD   6 each at 12/08/20 [phone]  . dextrose 5 % in lactated ringers infusion   Intravenous Continuous Manuela Schwartz, NP 125 mL/hr at 12/08/20 1301 New Bag at 12/08/20 1301  . diclofenac Sodium (VOLTAREN) 1 % topical gel 2 g  2 g Topical QID Harlon Ditty D, NP   2 g at 12/08/20 1530  . fentaNYL (SUBLIMAZE) injection 12.5 mcg  12.5 mcg Intravenous Q6H PRN Laquida Cotrell, Jackquline Denmark, MD   12.5 mcg at 12/08/20 0841  . insulin aspart (novoLOG) injection 0-9 Units  0-9 Units Subcutaneous Q4H Harlon Ditty D, NP   2 Units at 12/08/20 1726  . lidocaine (LIDODERM) 5 % 1 patch  1 patch Transdermal Q24H Lynn Ito, MD   1 patch at 12/08/20 1522  . magnesium hydroxide (MILK OF MAGNESIA) suspension 30 mL  30 mL Oral Daily PRN Mansy, Jan A, MD      . mirtazapine (REMERON SOL-TAB) disintegrating tablet 30 mg  30 mg Oral QHS Tyshauna Finkbiner T, MD      . OLANZapine zydis (ZYPREXA) disintegrating tablet 5 mg  5 mg Oral QHS Aziya Arena T, MD      . ondansetron [facility]) tablet 4 mg  4 mg Oral Q6H PRN Mansy, Jan A, MD       Or  . ondansetron [facility]) injection 4 mg  4 mg Intravenous Q6H PRN Mansy, Jan A, MD   4 mg at 12/07/20 2124    Musculoskeletal: Strength & Muscle Tone: decreased Gait & Station: unable to stand Patient leans: N/A  Psychiatric Specialty Exam: Physical Exam Vitals and nursing note reviewed.  Constitutional:  Appearance: She is well-developed and well-nourished.  HENT:     Head: Normocephalic and atraumatic.  Eyes:     Conjunctiva/sclera: Conjunctivae normal.     Pupils: Pupils are equal, round, and reactive to light.  Cardiovascular:     Heart sounds: Normal heart sounds.  Pulmonary:     Effort: Pulmonary effort is normal.  Abdominal:     Palpations: Abdomen is soft.  Musculoskeletal:        General: Normal range of motion.     Cervical back: Normal range of motion.  Skin:    General: Skin is warm and dry.  Neurological:     Mental  Status: She is alert.  Psychiatric:        Attention and Perception: She is inattentive.        Speech: She is noncommunicative.     Review of Systems  Unable to perform ROS: Patient nonverbal    Blood pressure 95/60, pulse 79, temperature 97.8 F (36.6 C), temperature source Axillary, resp. rate 17, height 5\' 5"  (1.651 m), weight 103 kg, SpO2 95 %.Body mass index is 37.79 kg/m.  General Appearance: Casual  Eye Contact:  Minimal  Speech:  Negative  Volume:  Decreased  Mood:  Negative  Affect:  Constricted  Thought Process:  NA  Orientation:  Negative  Thought Content:  Negative  Suicidal Thoughts:  Unknown  Homicidal Thoughts:  No  Memory:  Negative  Judgement:  Negative  Insight:  Negative  Psychomotor Activity:  Negative  Concentration:  Concentration: Negative  Recall:  Negative  Fund of Knowledge:  Negative  Language:  Negative  Akathisia:  Negative  Handed:  Right  AIMS (if indicated):     Assets:  Social Support  ADL's:  Impaired  Cognition:  Impaired,  Severe  Sleep:        Treatment Plan Summary: Plan 65 year old woman continues to be unresponsive verbally not eating or drinking.  I did change her antidepressant to dissolvable tablets.  I do not see a feeding tube has been inserted yet so I will continue with this.  30 mg of mirtazapine and 5 mg of olanzapine at night.  Tried to offer some support and counseling to the extent that I could.  I will follow-up as needed.  Disposition: Supportive therapy provided about ongoing stressors.  Mordecai RasmussenJohn Forestine Macho, MD 12/08/2020 6:39 PM

## 2020-12-08 NOTE — Consult Note (Signed)
[place], [place] 12/08/20  Subjective:   LOS: 5 Chelsea Glass is a 65 year old African-American female with a past medical history of type 2 diabetes, hypertension, osteoarthritis, and COVID-19 in September 2021.She presents to the emergency room with altered mental status and excessive drowsiness for 4 days. She also reports some nausea with dry heaves without abdominal pain.  She has not eaten much and become bedbound since COVID-19 per daughter.  She is admitted with aspiration pneumonia.  We were consulted based on increased creatinine and decreased urine output.  At present, patient is unable to answer questions.  Objective:  Vital signs in last 24 hours:  Temp:  [97.4 F (36.3 C)-98.4 F (36.9 C)] 97.7 F (36.5 C) (03/03 1202) Pulse Rate:  [70-90] 82 (03/03 1202) Resp:  [11-18] 16 (03/03 1202) BP: (74-118)/(58-88) 100/61 (03/03 1202) SpO2:  [94 %-100 %] 95 % (03/03 1202) Weight:  [103 kg] 103 kg (03/03 0349)  Weight change: 1 kg Filed Weights   12/03/20 1124 12/07/20 0458 12/08/20 0349  Weight: 95.8 kg 102 kg 103 kg    Intake/Output:    Intake/Output Summary (Last 24 hours) at 12/08/2020 1253 Last data filed at 12/08/2020 1212 Gross per 24 hour  Intake 1819.98 ml  Output 473 ml  Net 1346.98 ml     Physical Exam: General: Ill appearing, no acute distress  HEENT Normocephalic  Pulm/lungs Basilar wheeze  CVS/Heart S1-S2 present, regular rate  Abdomen:  Soft, nontender  Extremities: Minimal peripheral edema  Neurologic: Unable to answer questions, drowsy  Skin: No masses or rashes          Basic Metabolic Panel:  Recent Labs  Lab 12/03/20 1301 12/03/20 1437 12/05/20 0702 12/06/20 0505 12/06/20 1647 12/07/20 0437 12/07/20 1642 12/08/20 0501  NA 136  --   --  143  --  139  --  141  K 3.1*  --   --  3.1*   3.1*  --  3.9  --  3.3*  CL 102  --   --  114*  --  113*  --  115*  CO2 18*  --   --  19*  --  19*  --  19*   GLUCOSE 124*  --   --  94  --  161*  --  191*  BUN 29*  --   --  23  --  20  --  19  CREATININE 1.46*  --   --  1.21*  --  1.05*  --  1.35*  CALCIUM 9.7  --   --  8.8*  --  8.5*  --  8.5*  MG  --    < > 1.7  --  2.2 1.9 1.7 1.6*  PHOS  --   --  2.8  --  1.6* 2.4* 1.8* 2.0*   < > = values in this interval not displayed.     CBC: Recent Labs  Lab 12/03/20 1301 12/05/20 0432 12/06/20 0754 12/07/20 0730 12/08/20 0810 12/08/20 0930  WBC 12.7* 12.2* 9.4 10.8* 13.3*  --   NEUTROABS  --  6.9  --   --   --   --   HGB 13.1 13.1 10.8* 9.6* 6.5* 7.4*  HCT 38.4 37.9 30.8* 28.0* 19.1* 21.9*  MCV 82.9 83.1 82.4 84.1 83.8  --   PLT 268 235 269 247 214  --       Lab Results  Component Value Date   HEPBSAG NON REACTIVE 06/13/2020  HEPBIGM NON REACTIVE 06/13/2020      Microbiology:  Recent Results (from the past 240 hour(s))  Resp Panel by RT-PCR (Flu A&B, Covid) Nasopharyngeal Swab     Status: None   Collection Time: 12/03/20  1:35 PM   Specimen: Nasopharyngeal Swab; Nasopharyngeal(NP) swabs in vial transport medium  Result Value Ref Range Status   SARS Coronavirus 2 by RT PCR NEGATIVE NEGATIVE Final    Comment: (NOTE) SARS-CoV-2 target nucleic acids are NOT DETECTED.  The SARS-CoV-2 RNA is generally detectable in upper respiratory specimens during the acute phase of infection. The lowest concentration of SARS-CoV-2 viral copies this assay can detect is 138 copies/mL. A negative result does not preclude SARS-Cov-2 infection and should not be used as the sole basis for treatment or other patient management decisions. A negative result may occur with  improper specimen collection/handling, submission of specimen other than nasopharyngeal swab, presence of viral mutation(s) within the areas targeted by this assay, and inadequate number of viral copies(<138 copies/mL). A negative result must be combined with clinical observations, patient history, and  epidemiological information. The expected result is Negative.  Fact Sheet for Patients:  EntrepreneurPulse.com.au  Fact Sheet for Healthcare Providers:  IncredibleEmployment.be  This test is no t yet approved or cleared by the [place] FDA and  has been authorized for detection and/or diagnosis of SARS-CoV-2 by FDA under an Emergency Use Authorization (EUA). This EUA will remain  in effect (meaning this test can be used) for the duration of the COVID-19 declaration under Section 564(b)(1) of the Act, 21 U.S.C.section 360bbb-3(b)(1), unless the authorization is terminated  or revoked sooner.       Influenza A by PCR NEGATIVE NEGATIVE Final   Influenza B by PCR NEGATIVE NEGATIVE Final    Comment: (NOTE) The Xpert Xpress SARS-CoV-2/FLU/RSV plus assay is intended as an aid in the diagnosis of influenza from Nasopharyngeal swab specimens and should not be used as a sole basis for treatment. Nasal washings and aspirates are unacceptable for Xpert Xpress SARS-CoV-2/FLU/RSV testing.  Fact Sheet for Patients: EntrepreneurPulse.com.au  Fact Sheet for Healthcare Providers: IncredibleEmployment.be  This test is not yet approved or cleared by the [place] FDA and has been authorized for detection and/or diagnosis of SARS-CoV-2 by FDA under an Emergency Use Authorization (EUA). This EUA will remain in effect (meaning this test can be used) for the duration of the COVID-19 declaration under Section 564(b)(1) of the Act, 21 U.S.C. section 360bbb-3(b)(1), unless the authorization is terminated or revoked.  Performed at [facility], [address] [place], [place] [address]   CULTURE, BLOOD (ROUTINE X 2) w Reflex to ID Panel     Status: None (Preliminary result)   Collection Time: 12/06/20 10:55 AM   Specimen: BLOOD  Result Value Ref Range Status   Specimen Description BLOOD LEFT WRIST  Final    Special Requests   Final    BOTTLES DRAWN AEROBIC ONLY Blood Culture adequate volume   Culture   Final    NO GROWTH 2 DAYS Performed at [facility], [address] [place], [place] [address]    Report Status PENDING  Incomplete  CULTURE, BLOOD (ROUTINE X 2) w Reflex to ID Panel     Status: None (Preliminary result)   Collection Time: 12/06/20 11:05 AM   Specimen: BLOOD  Result Value Ref Range Status   Specimen Description BLOOD RIGHT WRIST  Final   Special Requests   Final    BOTTLES DRAWN AEROBIC ONLY Blood  Culture results may not be optimal due to an inadequate volume of blood received in culture bottles   Culture   Final    NO GROWTH 2 DAYS Performed at [facility], [address] [place], [place] [address]    Report Status PENDING  Incomplete  MRSA PCR Screening     Status: None   Collection Time: 12/06/20  1:18 PM   Specimen: Nasal Mucosa; Nasopharyngeal  Result Value Ref Range Status   MRSA by PCR NEGATIVE NEGATIVE Final    Comment:        The GeneXpert MRSA Assay (FDA approved for NASAL specimens only), is one component of a comprehensive MRSA colonization surveillance program. It is not intended to diagnose MRSA infection nor to guide or monitor treatment for MRSA infections. Performed at [facility], [address] [place], [place] [address]   Urine Culture     Status: None   Collection Time: 12/06/20  1:18 PM   Specimen: Urine, Random  Result Value Ref Range Status   Specimen Description   Final    URINE, RANDOM Performed at [facility], [address], [place], [place] [address]    Special Requests   Final    NONE Performed at [facility], [address] [place], [place] [address]    Culture   Final    NO GROWTH Performed at [facility], [place] [address], [place],  [address]    Report Status 12/08/2020 FINAL  Final    Coagulation Studies: Recent Labs    12/06/20 1647  LABPROT  15.8*  INR 1.3*    Urinalysis: Recent Labs    12/06/20 1318  COLORURINE AMBER*  LABSPEC 1.019  PHURINE 5.0  GLUCOSEU NEGATIVE  HGBUR MODERATE*  BILIRUBINUR NEGATIVE  KETONESUR 5*  PROTEINUR 30*  NITRITE NEGATIVE  LEUKOCYTESUR SMALL*      Imaging: MR BRAIN W CONTRAST  Result Date: 12/06/2020 CLINICAL DATA:  Abnormal MRI EXAM: MRI HEAD WITH CONTRAST TECHNIQUE: Multiplanar, multiecho pulse sequences of the brain and surrounding structures were obtained with intravenous contrast. 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 There is mild mass effect on the tectum. Chronic enlargement of the third and lateral ventricles could be related to narrowing of the third ventricle outflow. Vascular: Unremarkable. Skull and upper cervical spine: Unremarkable. Sinuses/Orbits: No new findings. Other: Expanded, "empty" sella. 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 Chronic enlargement of the third and lateral ventricles could be related to narrowing of the third ventricle outflow by above. Electronically Signed   By: PMacy MisM.D.   On: 12/06/2020 17:10   DG Chest Port 1 View  Result Date: 12/06/2020 CLINICAL DATA:  Central line adjustment EXAM: PORTABLE CHEST 1 VIEW COMPARISON:  12/06/2020 at 1244 hours FINDINGS: Left IJ approach central venous catheter is again seen projecting over the SVC with tip closely approximating the lateral wall of the SVC. Catheter appears slightly retracted relative to the previous exam. Stable heart size. Low lung volumes. Streaky bilateral airspace opacities, similar to prior. No pneumothorax. IMPRESSION: 1. Left IJ approach central venous catheter again seen with tip closely approximating the lateral wall of the SVC. Consider  repositioning. 2. No pneumothorax. Electronically Signed   By: NDavina PokeD.O.   On: 12/06/2020 15:43     Medications:  amiodarone 30 mg/hr (12/08/20 1215)   dextrose 5% lactated ringers 125 mL/hr at 12/08/20 0454   potassium PHOSPHATE IVPB (in mmol) 15 mmol (12/08/20 1154)    Chlorhexidine Gluconate Cloth  6 each Topical Daily   diclofenac Sodium  2 g Topical QID   insulin aspart  0-9 Units Subcutaneous Q4H   metoprolol succinate  25 mg Oral Daily   mirtazapine  30 mg Oral QHS   OLANZapine zydis  5 mg Oral QHS   acetaminophen **OR** acetaminophen, acetaminophen, fentaNYL (SUBLIMAZE) injection, magnesium hydroxide, ondansetron **OR** ondansetron (ZOFRAN) IV  Assessment/ Plan:  65 y.o. female with  was admitted on 12/03/2020 for  Principal Problem:   Depression Active Problems:   AKI (acute kidney injury) ([place])   Adult failure to thrive   Hypokalemia   Weight loss   Altered mental status   Tachycardia   Hypotension  AKI (acute kidney injury) ([place]) [N17.9] Altered mental status, unspecified altered mental status type [R41.82]  #. Acute Kidney Injury  -likely due to dehydration  -Baseline Creatinine 0.95/EGFR>60 on 09/21/2020 -Currently 1.35 -Will order Renal US -Obtain UPEP/SPEP -Continue IVF -No dialysis at this time -If urine output remains poor, may reconsider temporary dialysis -will monitor labs for now  #. Anemia   Lab Results  Component Value Date   HGB 7.4 (L) 12/08/2020   Will monitor Primary team may treat  #. Diabetes type 2 Hgb A1c MFr Bld (%)  Date Value  12/03/2020 4.6 (L)  Glucose controlled  Appreciate the consult   LOS: [place] 3/3/202212:53 PM  [place], [place]

## 2020-12-09 ENCOUNTER — Other Ambulatory Visit: Payer: Self-pay | Admitting: Emergency Medicine

## 2020-12-09 ENCOUNTER — Encounter: Payer: Self-pay | Admitting: Family Medicine

## 2020-12-09 ENCOUNTER — Inpatient Hospital Stay: Payer: Medicare Other

## 2020-12-09 DIAGNOSIS — F32A Depression, unspecified: Secondary | ICD-10-CM | POA: Diagnosis not present

## 2020-12-09 DIAGNOSIS — N179 Acute kidney failure, unspecified: Secondary | ICD-10-CM | POA: Diagnosis not present

## 2020-12-09 DIAGNOSIS — R400 Somnolence: Secondary | ICD-10-CM | POA: Diagnosis not present

## 2020-12-09 DIAGNOSIS — R627 Adult failure to thrive: Secondary | ICD-10-CM | POA: Diagnosis not present

## 2020-12-09 HISTORY — PX: IR FLUORO GUIDE CV LINE RIGHT: IMG2283

## 2020-12-09 HISTORY — PX: IR GASTROSTOMY TUBE MOD SED: IMG625

## 2020-12-09 LAB — COMPREHENSIVE METABOLIC PANEL
ALT: 41 U/L (ref 0–44)
AST: 33 U/L (ref 15–41)
Albumin: 1.8 g/dL — ABNORMAL LOW (ref 3.5–5.0)
Alkaline Phosphatase: 180 U/L — ABNORMAL HIGH (ref 38–126)
Anion gap: 8 (ref 5–15)
BUN: 17 mg/dL (ref 8–23)
CO2: 18 mmol/L — ABNORMAL LOW (ref 22–32)
Calcium: 8.3 mg/dL — ABNORMAL LOW (ref 8.9–10.3)
Chloride: 114 mmol/L — ABNORMAL HIGH (ref 98–111)
Creatinine, Ser: 1.29 mg/dL — ABNORMAL HIGH (ref 0.44–1.00)
GFR, Estimated: 46 mL/min — ABNORMAL LOW (ref >60)
Glucose, Bld: 157 mg/dL — ABNORMAL HIGH (ref 70–99)
Potassium: 3.6 mmol/L (ref 3.5–5.1)
Sodium: 140 mmol/L (ref 135–145)
Total Bilirubin: 0.7 mg/dL (ref 0.3–1.2)
Total Protein: 4.2 g/dL — ABNORMAL LOW (ref 6.5–8.1)

## 2020-12-09 LAB — CBC
HCT: 18.6 % — ABNORMAL LOW (ref 36.0–46.0)
Hemoglobin: 6.4 g/dL — ABNORMAL LOW (ref 12.0–15.0)
MCH: 29.1 pg (ref 26.0–34.0)
MCHC: 34.4 g/dL (ref 30.0–36.0)
MCV: 84.5 fL (ref 80.0–100.0)
Platelets: 210 10*3/uL (ref 150–400)
RBC: 2.2 MIL/uL — ABNORMAL LOW (ref 3.87–5.11)
RDW: 19.5 % — ABNORMAL HIGH (ref 11.5–15.5)
WBC: 13 10*3/uL — ABNORMAL HIGH (ref 4.0–10.5)
nRBC: 0.2 % (ref 0.0–0.2)

## 2020-12-09 LAB — HEMOGLOBIN AND HEMATOCRIT, BLOOD
HCT: 19.1 % — ABNORMAL LOW (ref 36.0–46.0)
Hemoglobin: 6.3 g/dL — ABNORMAL LOW (ref 12.0–15.0)

## 2020-12-09 LAB — PREPARE RBC (CROSSMATCH)

## 2020-12-09 LAB — MAGNESIUM: Magnesium: 1.8 mg/dL (ref 1.7–2.4)

## 2020-12-09 LAB — GLUCOSE, CAPILLARY
Glucose-Capillary: 133 mg/dL — ABNORMAL HIGH (ref 70–99)
Glucose-Capillary: 147 mg/dL — ABNORMAL HIGH (ref 70–99)
Glucose-Capillary: 148 mg/dL — ABNORMAL HIGH (ref 70–99)
Glucose-Capillary: 157 mg/dL — ABNORMAL HIGH (ref 70–99)
Glucose-Capillary: 158 mg/dL — ABNORMAL HIGH (ref 70–99)
Glucose-Capillary: 167 mg/dL — ABNORMAL HIGH (ref 70–99)
Glucose-Capillary: 186 mg/dL — ABNORMAL HIGH (ref 70–99)
Glucose-Capillary: 202 mg/dL — ABNORMAL HIGH (ref 70–99)

## 2020-12-09 LAB — ABO/RH: ABO/RH(D): O POS

## 2020-12-09 LAB — PHOSPHORUS: Phosphorus: 2.4 mg/dL — ABNORMAL LOW (ref 2.5–4.6)

## 2020-12-09 IMAGING — XA IR PERC PLACEMENT GASTROSTOMY
7 of 8 series · 12 of 23 positions shown · non-contrast
Comparison: CT abdomen and pelvis-[DATE]

INDICATION: Dysphagia. Failure to thrive. Please perform percutaneous
gastrostomy tube placement for enteric nutrition supplementation
purposes.

EXAM:
PULL TROUGH GASTROSTOMY TUBE PLACEMENT

[Series 1: fluoroscopy - stored · 1 of 1 slices shown (1 of 7)]
[im 1/1]
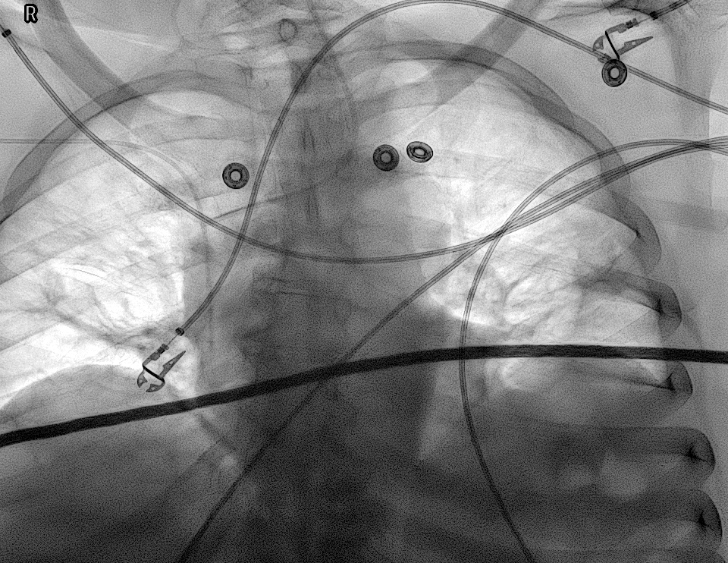

[Series 1: fluoroscopy - stored · 2 of 17 frames shown (2 of 7)]
[frame 3/17]
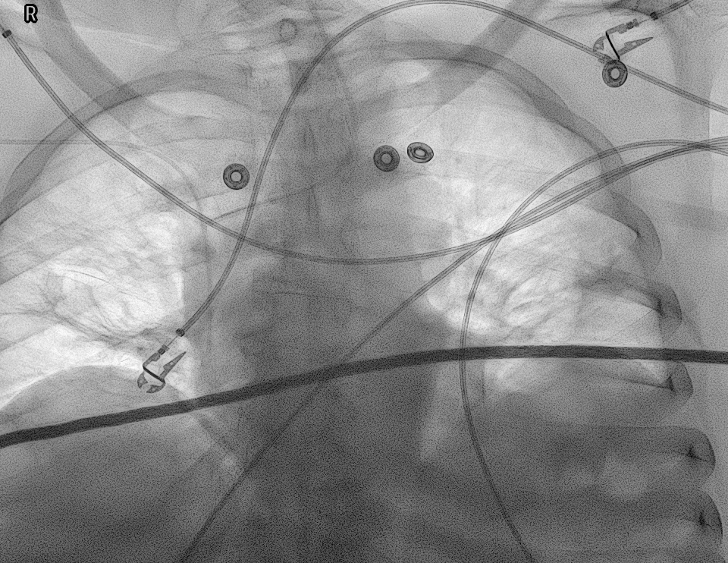
[frame 15/17]
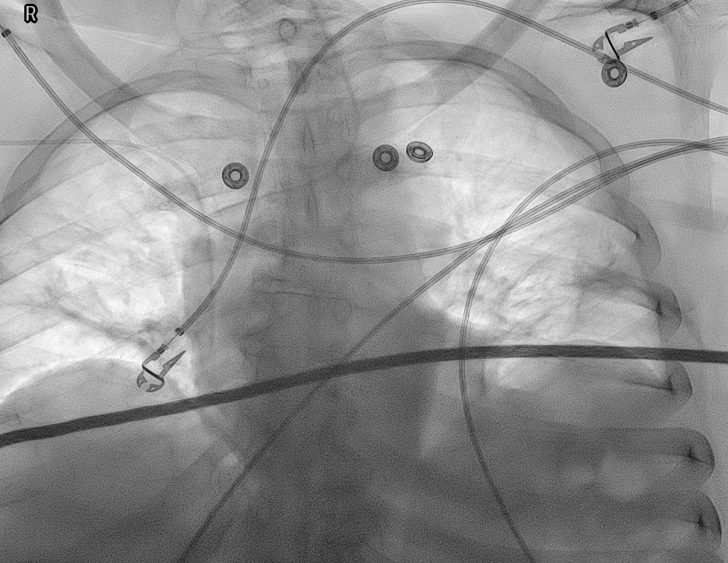

[Series 2: fluoroscopy - stored · 2 of 16 frames shown (3 of 7)]
[frame 3/16]
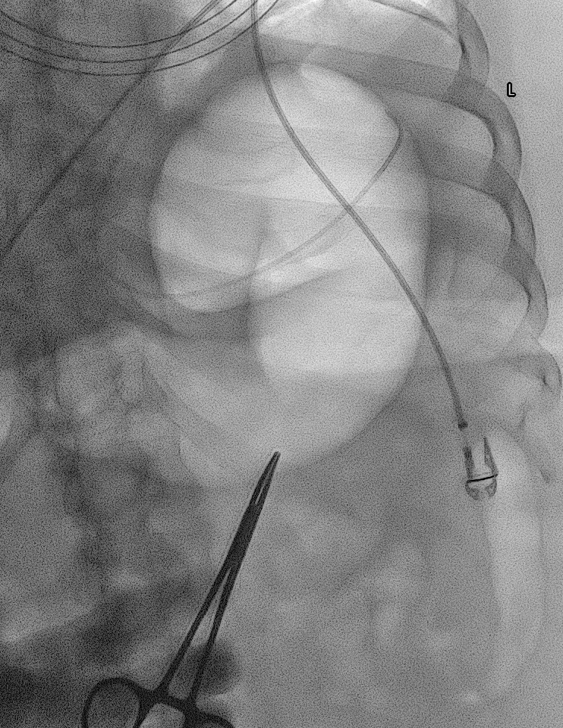
[frame 14/16]
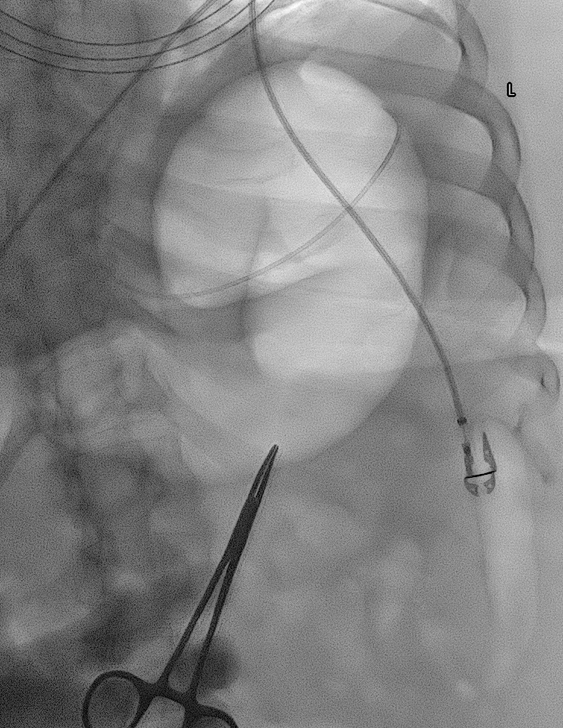

[Series 3: fluoroscopy - stored · 1 of 1 slices shown (4 of 7)]
[im 1/1]
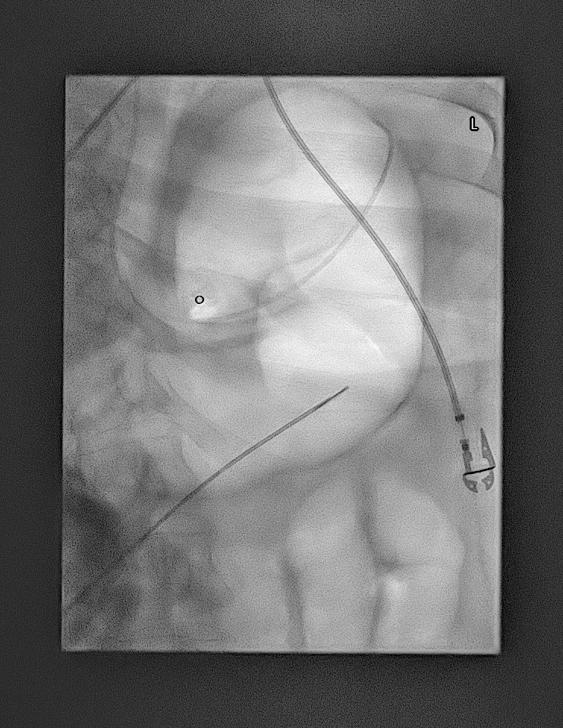

[Series 3: fluoroscopy - stored · 2 of 63 frames shown (5 of 7)]
[frame 10/63]
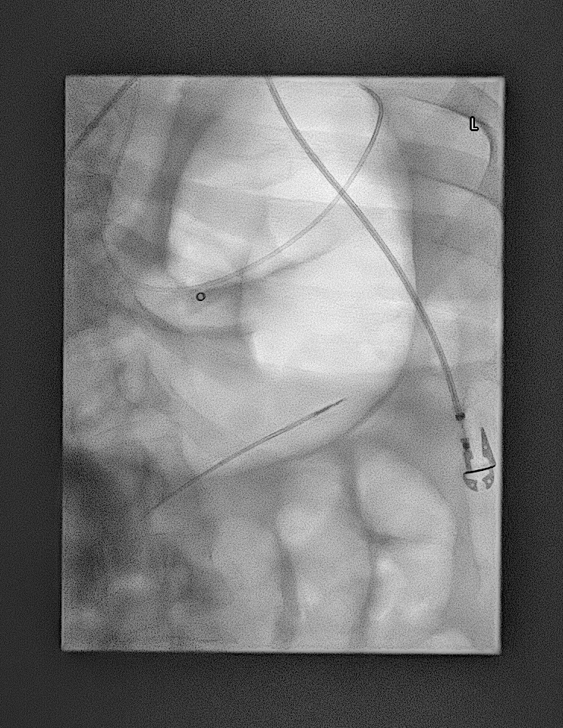
[frame 54/63]
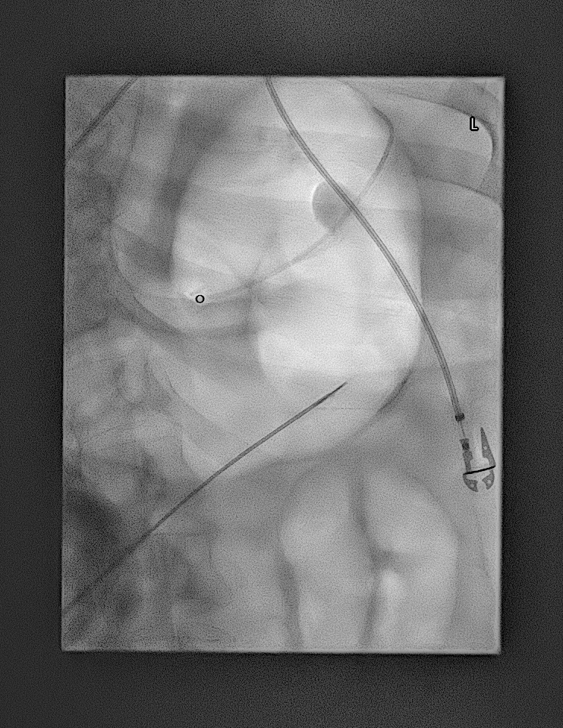

[Series 4: fluoroscopy - stored · 2 of 57 frames shown (6 of 7)]
[frame 9/57]
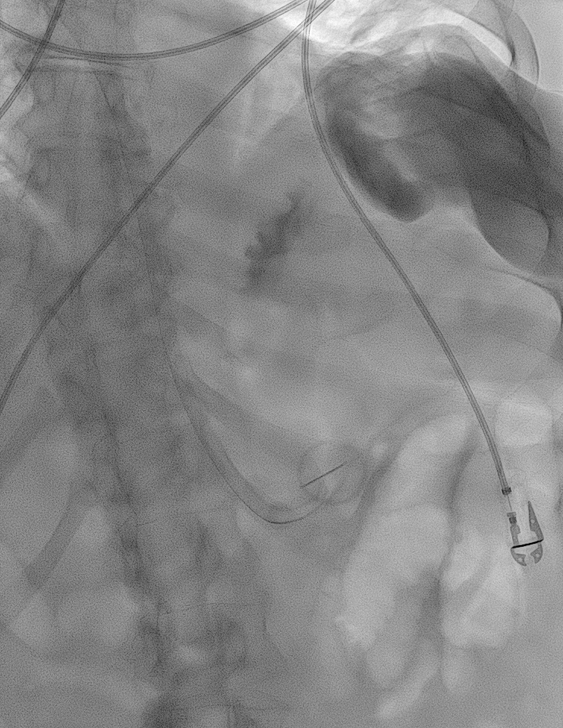
[frame 49/57]
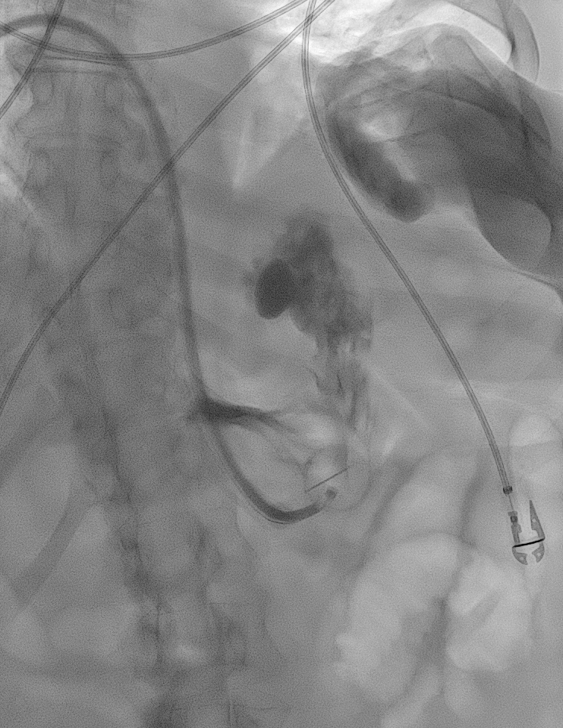

[Series 5: fluoroscopy - stored · 2 of 43 frames shown (7 of 7)]
[frame 22/43]
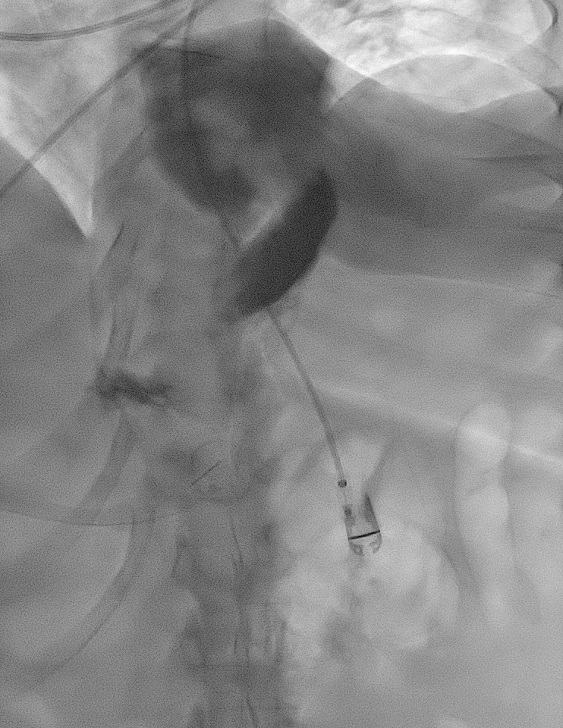
[frame 43/43]
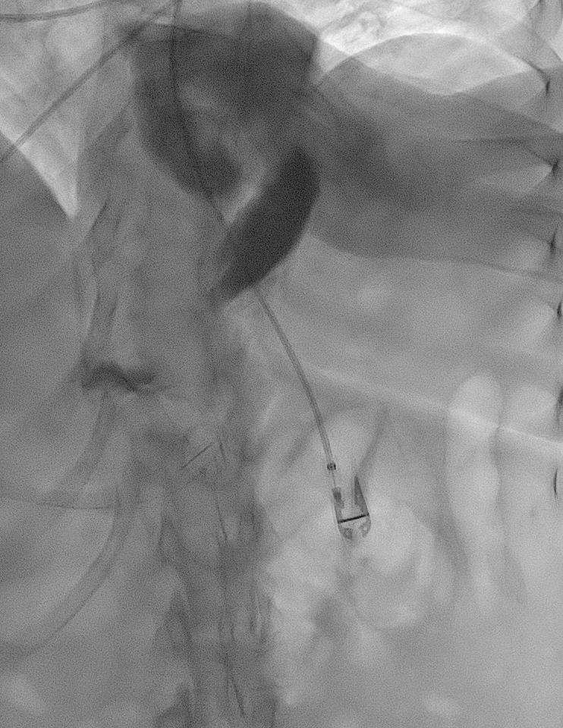

[12 of 23 positions shown; findings below may reference images not displayed]

MEDICATIONS:
Glucagon 1 mg IV

CONTRAST:  20 cc of Omnipaque 300 administered into the gastric
lumen.

ANESTHESIA/SEDATION:
Moderate (conscious) sedation was employed during this procedure. A
total of Versed 0.5 mg and Fentanyl 25 mcg was administered
intravenously.

Moderate Sedation Time: 10 minutes. The patient's level of
consciousness and vital signs were monitored continuously by
radiology nursing throughout the procedure under my direct
supervision.

FLUOROSCOPY TIME:  3 minutes, 6 seconds (16.2 mGy)

COMPLICATIONS:
None immediate.

PROCEDURE:
Informed written consent was obtained from the patient's daughter
following explanation of the procedure, risks, benefits and
alternatives. A time out was performed prior to the initiation of
the procedure. Ultrasound scanning was performed to demarcate the
edge of the left lobe of the liver. Maximal barrier sterile
technique utilized including caps, mask, sterile gowns, sterile
gloves, large sterile drape, hand hygiene and Betadine prep.

The left upper quadrant was sterilely prepped and draped. An oral
gastric catheter was inserted into the stomach under fluoroscopy.
The existing nasogastric feeding tube was removed. The left costal
margin and air opacified transverse colon were identified and
avoided. Air was injected into the stomach for insufflation and
visualization under fluoroscopy. Under sterile conditions a 17 gauge
trocar needle was utilized to access the stomach percutaneously
beneath the left subcostal margin after the overlying soft tissues
were anesthetized with 1% Lidocaine with epinephrine. Needle
position was confirmed within the stomach with aspiration of air and
injection of small amount of contrast. A single T tack was deployed
for gastropexy. Over an Amplatz guide wire, a 9-French sheath was
inserted into the stomach. A snare device was utilized to capture
the oral gastric catheter. The snare device was pulled retrograde
from the stomach up the esophagus and out the oropharynx. The
20-French pull-through gastrostomy was connected to the snare device
and pulled antegrade through the oropharynx down the esophagus into
the stomach and then through the percutaneous tract external to the
patient. The gastrostomy was assembled externally. Contrast
injection confirms position in the stomach. Several spot
radiographic images were obtained in various obliquities for
documentation. The patient tolerated procedure well without
immediate post procedural complication.
FINDINGS: After successful fluoroscopic guided placement, the gastrostomy tube
is appropriately positioned with internal disc against the ventral
aspect of the gastric lumen.
IMPRESSION: Successful fluoroscopic insertion of a 20-French pull-through
gastrostomy tube.

The gastrostomy may be used immediately for medication
administration and in 24 hrs for the initiation of feeds.

## 2020-12-09 MED ORDER — GLUCAGON HCL RDNA (DIAGNOSTIC) 1 MG IJ SOLR
INTRAMUSCULAR | Status: AC | PRN
  Administered 2020-12-09: 1 mg via INTRAVENOUS

## 2020-12-09 MED ORDER — AMIODARONE HCL 200 MG PO TABS
200.0000 mg | ORAL_TABLET | Freq: Two times a day (BID) | ORAL | Status: DC
  Administered 2020-12-09 – 2020-12-11 (×6): 200 mg via ORAL
  Filled 2020-12-09 (×6): qty 1

## 2020-12-09 MED ORDER — FENTANYL CITRATE (PF) 100 MCG/2ML IJ SOLN
INTRAMUSCULAR | Status: AC | PRN
  Administered 2020-12-09: 25 ug via INTRAVENOUS

## 2020-12-09 MED ORDER — POTASSIUM PHOSPHATES 15 MMOLE/5ML IV SOLN
15.0000 mmol | Freq: Once | INTRAVENOUS | Status: AC
  Administered 2020-12-09: 15 mmol via INTRAVENOUS
  Filled 2020-12-09: qty 5

## 2020-12-09 MED ORDER — MAGNESIUM SULFATE 2 GM/50ML IV SOLN
2.0000 g | Freq: Once | INTRAVENOUS | Status: AC
  Administered 2020-12-09: 2 g via INTRAVENOUS
  Filled 2020-12-09: qty 50

## 2020-12-09 MED ORDER — GLUCAGON HCL RDNA (DIAGNOSTIC) 1 MG IJ SOLR
INTRAMUSCULAR | Status: AC
  Filled 2020-12-09: qty 1

## 2020-12-09 MED ORDER — MIDAZOLAM HCL 2 MG/2ML IJ SOLN
INTRAMUSCULAR | Status: AC
  Filled 2020-12-09: qty 2

## 2020-12-09 MED ORDER — MIDAZOLAM HCL 2 MG/2ML IJ SOLN
INTRAMUSCULAR | Status: AC | PRN
Start: 2020-12-09 — End: 2020-12-09
  Administered 2020-12-09: 0.5 mg via INTRAVENOUS

## 2020-12-09 MED ORDER — CEFAZOLIN SODIUM-DEXTROSE 1-4 GM/50ML-% IV SOLN
INTRAVENOUS | Status: AC | PRN
  Administered 2020-12-09: 2 g via INTRAVENOUS

## 2020-12-09 MED ORDER — DULOXETINE HCL 30 MG PO CPEP
30.0000 mg | ORAL_CAPSULE | Freq: Every day | ORAL | Status: DC
  Administered 2020-12-09 – 2020-12-13 (×5): 30 mg via ORAL
  Filled 2020-12-09 (×5): qty 1

## 2020-12-09 MED ORDER — IODIXANOL 320 MG/ML IV SOLN: 50.0000 mL | Freq: Once | INTRAVENOUS | Status: DC

## 2020-12-09 MED ORDER — FENTANYL CITRATE (PF) 100 MCG/2ML IJ SOLN
INTRAMUSCULAR | Status: AC
  Filled 2020-12-09: qty 2

## 2020-12-09 MED ORDER — IODIXANOL 320 MG/ML IV SOLN
50.0000 mL | Freq: Once | INTRAVENOUS | Status: AC
  Administered 2020-12-09: 10 mL

## 2020-12-09 MED ORDER — SODIUM CHLORIDE 0.9% IV SOLUTION: Freq: Once | INTRAVENOUS | Status: DC

## 2020-12-09 MED ORDER — MIRTAZAPINE 15 MG PO TBDP
45.0000 mg | ORAL_TABLET | Freq: Every day | ORAL | Status: DC
  Administered 2020-12-09 – 2020-12-13 (×5): 45 mg via ORAL
  Filled 2020-12-09 (×5): qty 3

## 2020-12-09 MED ORDER — CEFAZOLIN SODIUM-DEXTROSE 2-4 GM/100ML-% IV SOLN
2.0000 g | Freq: Once | INTRAVENOUS | Status: DC
  Filled 2020-12-09: qty 100

## 2020-12-09 MED ORDER — AMIODARONE HCL IN DEXTROSE 360-4.14 MG/200ML-% IV SOLN
INTRAVENOUS | Status: AC
  Filled 2020-12-09: qty 200

## 2020-12-09 NOTE — Consult Note (Signed)
[facility] Face-to-Face Psychiatry Consult   Reason for Consult: Follow-up consult 65 year old woman with what appears to be a severe withdrawn depression Referring Physician: Amery Patient Identification: Chelsea Glass MRN:  [id number] Principal Diagnosis: Depression Diagnosis:  Principal Problem:   Depression Active Problems:   AKI (acute kidney injury) (HCC)   Adult failure to thrive   Hypokalemia   Weight loss   Altered mental status   Tachycardia   Hypotension   Total Time spent with patient: 30 minutes  Subjective:   Chelsea Glass is a 65 y.o. female patient admitted with patient not able to give information.  HPI: Patient seen.  Daughter present.  Daughter reports patient had G-tube placed today and is recovering from the procedure.  Earlier today apparently she opened her eyes slightly and nodded and slightly communicated with daughter but this evening she is back to being sleepy.  No other change to the mental status  Past Psychiatric History: See previous  Risk to Self:   Risk to Others:   Prior Inpatient Therapy:   Prior Outpatient Therapy:    Past Medical History:  Past Medical History:  Diagnosis Date  . Arthritis   . Diabetes mellitus without complication (HCC)   . Hypertension     Past Surgical History:  Procedure Laterality Date  . IR FLUORO GUIDE CV LINE RIGHT  12/09/2020  . IR GASTROSTOMY TUBE MOD SED  12/09/2020  . TUBAL LIGATION    . UTERINE FIBROID SURGERY     Family History: History reviewed. No pertinent family history. Family Psychiatric  History: See previous Social History:  Social History   Substance and Sexual Activity  Alcohol Use No     Social History   Substance and Sexual Activity  Drug Use No    Social History   Socioeconomic History  . Marital status: Widowed    Spouse name: Not on file  . Number of children: Not on file  . Years of education: Not on file  . Highest education level: Not on file  Occupational History  .  Not on file  Tobacco Use  . Smoking status: Never Smoker  . Smokeless tobacco: Never Used  Substance and Sexual Activity  . Alcohol use: No  . Drug use: No  . Sexual activity: Not on file  Other Topics Concern  . Not on file  Social History Narrative  . Not on file   Social Determinants of Health   Financial Resource Strain: Not on file  Food Insecurity: Not on file  Transportation Needs: Not on file  Physical Activity: Not on file  Stress: Not on file  Social Connections: Not on file   Additional Social History:    Allergies:   Allergies  Allergen Reactions  . Lisinopril Swelling    Labs:  Results for orders placed or performed during the hospital encounter of 12/03/20 (from the past 48 hour(s))  Glucose, capillary     Status: Abnormal   Collection Time: 12/07/20  9:23 PM  Result Value Ref Range   Glucose-Capillary 174 (H) 70 - 99 mg/dL    Comment: Glucose reference range applies only to samples taken after fasting for at least 8 hours.  Glucose, capillary     Status: Abnormal   Collection Time: 12/07/20 11:55 PM  Result Value Ref Range   Glucose-Capillary 182 (H) 70 - 99 mg/dL    Comment: Glucose reference range applies only to samples taken after fasting for at least 8 hours.  Heparin level (unfractionated)  Status: None   Collection Time: 12/08/20  2:05 AM  Result Value Ref Range   Heparin Unfractionated 0.60 0.30 - 0.70 IU/mL    Comment: (NOTE) If heparin results are below expected values, and patient dosage has  been confirmed, suggest follow up testing of antithrombin III levels. Performed at [facility], [address] [place], [place] [address]   Glucose, capillary     Status: Abnormal   Collection Time: 12/08/20  4:25 AM  Result Value Ref Range   Glucose-Capillary 166 (H) 70 - 99 mg/dL    Comment: Glucose reference range applies only to samples taken after fasting for at least 8 hours.  Comprehensive metabolic panel     Status:  Abnormal   Collection Time: 12/08/20  5:01 AM  Result Value Ref Range   Sodium 141 135 - 145 mmol/L   Potassium 3.3 (L) 3.5 - 5.1 mmol/L   Chloride 115 (H) 98 - 111 mmol/L   CO2 19 (L) 22 - 32 mmol/L   Glucose, Bld 191 (H) 70 - 99 mg/dL    Comment: Glucose reference range applies only to samples taken after fasting for at least 8 hours.   BUN 19 8 - 23 mg/dL   Creatinine, Ser 7.40 (H) 0.44 - 1.00 mg/dL   Calcium 8.5 (L) 8.9 - 10.3 mg/dL   Total Protein 4.3 (L) 6.5 - 8.1 g/dL   Albumin 1.8 (L) 3.5 - 5.0 g/dL   AST 27 15 - 41 U/L   ALT 35 0 - 44 U/L   Alkaline Phosphatase 195 (H) 38 - 126 U/L   Total Bilirubin 0.6 0.3 - 1.2 mg/dL   GFR, Estimated 43 (L) >60 mL/min    Comment: (NOTE) Calculated using the CKD-EPI Creatinine Equation (2021)    Anion gap 7 5 - 15    Comment: Performed at [facility], [address], [place], [place] [address]  Magnesium     Status: Abnormal   Collection Time: 12/08/20  5:01 AM  Result Value Ref Range   Magnesium 1.6 (L) 1.7 - 2.4 mg/dL    Comment: Performed at [facility], [address] [place], [place] [address]  Phosphorus     Status: Abnormal   Collection Time: 12/08/20  5:01 AM  Result Value Ref Range   Phosphorus 2.0 (L) 2.5 - 4.6 mg/dL    Comment: Performed at [facility], [address] [place], [place] [address]  Glucose, capillary     Status: Abnormal   Collection Time: 12/08/20  7:43 AM  Result Value Ref Range   Glucose-Capillary 161 (H) 70 - 99 mg/dL    Comment: Glucose reference range applies only to samples taken after fasting for at least 8 hours.  CBC     Status: Abnormal   Collection Time: 12/08/20  8:10 AM  Result Value Ref Range   WBC 13.3 (H) 4.0 - 10.5 K/uL   RBC 2.28 (L) 3.87 - 5.11 MIL/uL   Hemoglobin 6.5 (L) 12.0 - 15.0 g/dL    Comment: REPEATED TO VERIFY   HCT 19.1 (L) 36.0 - 46.0 %   MCV 83.8 80.0 - 100.0 fL   MCH 28.5 26.0 - 34.0 pg   MCHC 34.0 30.0 - 36.0 g/dL   RDW 26.3 (H)  78.5 - 15.5 %   Platelets 214 150 - 400 K/uL   nRBC 0.2 0.0 - 0.2 %    Comment: Performed at [facility], [address] [place], [place] [address]  Heparin level (unfractionated)     Status: None   Collection Time: 12/08/20  8:10 AM  Result Value Ref Range   Heparin Unfractionated 0.37 0.30 - 0.70 IU/mL    Comment: (NOTE) If heparin results are below expected values, and patient dosage has  been confirmed, suggest follow up testing of antithrombin III levels. Performed at [facility], [address] [place], [place] [address]   Hemoglobin and hematocrit, blood     Status: Abnormal   Collection Time: 12/08/20  9:30 AM  Result Value Ref Range   Hemoglobin 7.4 (L) 12.0 - 15.0 g/dL   HCT 73.4 (L) 19.3 - 79.0 %    Comment: Performed at [facility], [address] [place], [place] [address]  Glucose, capillary     Status: Abnormal   Collection Time: 12/08/20 12:02 PM  Result Value Ref Range   Glucose-Capillary 141 (H) 70 - 99 mg/dL    Comment: Glucose reference range applies only to samples taken after fasting for at least 8 hours.  Glucose, capillary     Status: Abnormal   Collection Time: 12/08/20  4:31 PM  Result Value Ref Range   Glucose-Capillary 163 (H) 70 - 99 mg/dL    Comment: Glucose reference range applies only to samples taken after fasting for at least 8 hours.  Glucose, capillary     Status: Abnormal   Collection Time: 12/08/20  8:36 PM  Result Value Ref Range   Glucose-Capillary 164 (H) 70 - 99 mg/dL    Comment: Glucose reference range applies only to samples taken after fasting for at least 8 hours.  Glucose, capillary     Status: Abnormal   Collection Time: 12/09/20 12:09 AM  Result Value Ref Range   Glucose-Capillary 158 (H) 70 - 99 mg/dL    Comment: Glucose reference range applies only to samples taken after fasting for at least 8 hours.  Glucose, capillary     Status: Abnormal   Collection Time: 12/09/20  4:36 AM  Result  Value Ref Range   Glucose-Capillary 157 (H) 70 - 99 mg/dL    Comment: Glucose reference range applies only to samples taken after fasting for at least 8 hours.  Comprehensive metabolic panel     Status: Abnormal   Collection Time: 12/09/20  6:15 AM  Result Value Ref Range   Sodium 140 135 - 145 mmol/L   Potassium 3.6 3.5 - 5.1 mmol/L   Chloride 114 (H) 98 - 111 mmol/L   CO2 18 (L) 22 - 32 mmol/L   Glucose, Bld 157 (H) 70 - 99 mg/dL    Comment: Glucose reference range applies only to samples taken after fasting for at least 8 hours.   BUN 17 8 - 23 mg/dL   Creatinine, Ser 3.53 (H) 0.44 - 1.00 mg/dL   Calcium 8.3 (L) 8.9 - 10.3 mg/dL   Total Protein 4.2 (L) 6.5 - 8.1 g/dL   Albumin 1.8 (L) 3.5 - 5.0 g/dL   AST 33 15 - 41 U/L   ALT 41 0 - 44 U/L   Alkaline Phosphatase 180 (H) 38 - 126 U/L   Total Bilirubin 0.7 0.3 - 1.2 mg/dL   GFR, Estimated 46 (L) >60 mL/min    Comment: (NOTE) Calculated using the CKD-EPI Creatinine Equation (2021)    Anion gap 8 5 - 15    Comment: Performed at [facility], [address], [place], [place] [address]  Magnesium     Status: None  Collection Time: 12/09/20  6:15 AM  Result Value Ref Range   Magnesium 1.8 1.7 - 2.4 mg/dL    Comment: Performed at [facility], [address] [place], [place] [address]  Phosphorus     Status: Abnormal   Collection Time: 12/09/20  6:15 AM  Result Value Ref Range   Phosphorus 2.4 (L) 2.5 - 4.6 mg/dL    Comment: Performed at [facility], [address] [place], [place] [address]  CBC     Status: Abnormal   Collection Time: 12/09/20  6:15 AM  Result Value Ref Range   WBC 13.0 (H) 4.0 - 10.5 K/uL   RBC 2.20 (L) 3.87 - 5.11 MIL/uL   Hemoglobin 6.4 (L) 12.0 - 15.0 g/dL   HCT 52.8 (L) 41.3 - 24.4 %   MCV 84.5 80.0 - 100.0 fL   MCH 29.1 26.0 - 34.0 pg   MCHC 34.4 30.0 - 36.0 g/dL   RDW 01.0 (H) 27.2 - 53.6 %   Platelets 210 150 - 400 K/uL   nRBC 0.2 0.0 - 0.2 %    Comment:  Performed at [facility], [address], [place], [place] [address]  ABO/Rh     Status: None   Collection Time: 12/09/20  6:15 AM  Result Value Ref Range   ABO/RH(D)      O POS Performed at [facility], [address] [place], [place] [address]   Glucose, capillary     Status: Abnormal   Collection Time: 12/09/20  7:28 AM  Result Value Ref Range   Glucose-Capillary 147 (H) 70 - 99 mg/dL    Comment: Glucose reference range applies only to samples taken after fasting for at least 8 hours.  Hemoglobin and hematocrit, blood     Status: Abnormal   Collection Time: 12/09/20  8:05 AM  Result Value Ref Range   Hemoglobin 6.3 (L) 12.0 - 15.0 g/dL   HCT 95.6 (L) 38.7 - 56.4 %    Comment: Performed at [facility], [address] [place], [place] [address]  Glucose, capillary     Status: Abnormal   Collection Time: 12/09/20 11:07 AM  Result Value Ref Range   Glucose-Capillary 133 (H) 70 - 99 mg/dL    Comment: Glucose reference range applies only to samples taken after fasting for at least 8 hours.  Glucose, capillary     Status: Abnormal   Collection Time: 12/09/20 12:49 PM  Result Value Ref Range   Glucose-Capillary 148 (H) 70 - 99 mg/dL    Comment: Glucose reference range applies only to samples taken after fasting for at least 8 hours.  Prepare RBC (crossmatch)     Status: None   Collection Time: 12/09/20  2:43 PM  Result Value Ref Range   Order Confirmation      ORDER PROCESSED BY BLOOD BANK Performed at [facility], [address] [place], [place] [address]   Type and screen [facility]     Status: None (Preliminary result)   Collection Time: 12/09/20  2:58 PM  Result Value Ref Range   ABO/RH(D) PENDING    Antibody Screen PENDING    Sample Expiration      12/12/2020,2359 Performed at [facility], [address] [place], [place] [address]   Type and screen     Status: None (Preliminary result)    Collection Time: 12/09/20  3:49 PM  Result Value Ref Range   ABO/RH(D) O POS  Antibody Screen NEG    Sample Expiration      12/12/2020,2359 Performed at [facility], [address] [place], [place] [address]    Unit Number [id number]    Blood Component Type RED CELLS,LR    Unit division 00    Status of Unit ALLOCATED    Transfusion Status OK TO TRANSFUSE    Crossmatch Result Compatible   Glucose, capillary     Status: Abnormal   Collection Time: 12/09/20  5:55 PM  Result Value Ref Range   Glucose-Capillary 202 (H) 70 - 99 mg/dL    Comment: Glucose reference range applies only to samples taken after fasting for at least 8 hours.    Current Facility-Administered Medications  Medication Dose Route Frequency Provider Last Rate Last Admin  . 0.9 %  sodium chloride infusion (Manually program via Guardrails IV Fluids)   Intravenous Once Lynn ItoAmery, Sahar, MD      . acetaminophen (TYLENOL) tablet 650 mg  650 mg Oral Q6H PRN Mansy, Jan A, MD       Or  . acetaminophen (TYLENOL) suppository 650 mg  650 mg Rectal Q6H PRN Mansy, Vernetta HoneyJan A, MD   650 mg at 12/07/20 0454  . acetaminophen (TYLENOL) tablet 650 mg  650 mg Oral Q6H PRN Lynn ItoAmery, Sahar, MD      . amiodarone (PACERONE) tablet 200 mg  200 mg Oral BID Maryelizabeth KaufmannPacker, Trevor S, NP   200 mg at 12/09/20 1625  . ceFAZolin (ANCEF) IVPB 2g/100 mL premix  2 g Intravenous Once Gershon CraneBlair, Wendy Sanders, PA-C      . Chlorhexidine Gluconate Cloth 2 % PADS 6 each  6 each Topical Daily Enedina FinnerPatel, Sona, MD   6 each at 12/09/20 1000  . dextrose 5 % in lactated ringers infusion   Intravenous Continuous Manuela SchwartzMorrison, Brenda, NP 125 mL/hr at 12/09/20 1821 Infusion Verify at 12/09/20 1821  . diclofenac Sodium (VOLTAREN) 1 % topical gel 2 g  2 g Topical QID Harlon DittyKeene, Jeremiah D, NP   2 g at 12/09/20 1836  . fentaNYL (SUBLIMAZE) 100 MCG/2ML injection           . fentaNYL (SUBLIMAZE) injection 12.5 mcg  12.5 mcg Intravenous Q6H PRN Laelle Bridgett, Jackquline DenmarkJohn T, MD   12.5 mcg at 12/08/20 0841   . glucagon (human recombinant) (GLUCAGEN) 1 MG injection           . insulin aspart (novoLOG) injection 0-9 Units  0-9 Units Subcutaneous Q4H Judithe ModestKeene, Jeremiah D, NP   3 Units at 12/09/20 1835  . lidocaine (LIDODERM) 5 % 1 patch  1 patch Transdermal Q24H Lynn ItoAmery, Sahar, MD   1 patch at 12/09/20 1627  . magnesium hydroxide (MILK OF MAGNESIA) suspension 30 mL  30 mL Oral Daily PRN Mansy, Jan A, MD      . midazolam (VERSED) 2 MG/2ML injection           . mirtazapine (REMERON SOL-TAB) disintegrating tablet 30 mg  30 mg Oral QHS Berthold Glace, Jackquline DenmarkJohn T, MD   30 mg at 12/08/20 2105  . OLANZapine zydis (ZYPREXA) disintegrating tablet 5 mg  5 mg Oral QHS Blease Capaldi, Jackquline DenmarkJohn T, MD   5 mg at 12/08/20 2104  . ondansetron (ZOFRAN) tablet 4 mg  4 mg Oral Q6H PRN Mansy, Jan A, MD       Or  . ondansetron [facility](ZOFRAN) injection 4 mg  4 mg Intravenous Q6H PRN Mansy, Jan A, MD   4 mg at 12/07/20 2124  . potassium PHOSPHATE 15 mmol in  dextrose 5 % 250 mL infusion  15 mmol Intravenous Once Tressie Ellis, [facility] 43 mL/hr at 12/09/20 1821 Infusion Verify at 12/09/20 1821    Musculoskeletal: Strength & Muscle Tone: decreased Gait & Station: unable to stand Patient leans: N/A  Psychiatric Specialty Exam: Physical Exam Vitals and nursing note reviewed.  Constitutional:      Appearance: She is well-developed and well-nourished.  HENT:     Head: Normocephalic and atraumatic.  Eyes:     Conjunctiva/sclera: Conjunctivae normal.     Pupils: Pupils are equal, round, and reactive to light.  Cardiovascular:     Heart sounds: Normal heart sounds.  Pulmonary:     Effort: Pulmonary effort is normal.  Abdominal:     Palpations: Abdomen is soft.  Musculoskeletal:        General: Normal range of motion.     Cervical back: Normal range of motion.  Skin:    General: Skin is warm and dry.  Psychiatric:        Attention and Perception: She is inattentive.     Review of Systems  Unable to perform ROS: Mental status change    Blood  pressure (!) 103/59, pulse 65, temperature 98.4 F (36.9 C), resp. rate 18, height  (1.651 m), weight 112 kg, SpO2 91 %.Body mass index is 41.1 kg/m.  General Appearance: Casual  Eye Contact:  None  Speech:  Negative  Volume:  Decreased  Mood:  Negative  Affect:  Negative  Thought Process:  NA  Orientation:  Negative  Thought Content:  Negative  Suicidal Thoughts:  No  Homicidal Thoughts:  No  Memory:  Negative  Judgement:  Negative  Insight:  Negative  Psychomotor Activity:  Negative  Concentration:  Concentration: Negative  Recall:  Negative  Fund of Knowledge:  Negative  Language:  Negative  Akathisia:  Negative  Handed:  Right  AIMS (if indicated):     Assets:  Social Support  ADL's:  Impaired  Cognition:  Impaired,  Severe  Sleep:        Treatment Plan Summary: Medication management and Plan Patient now has a G-tube and we can restart other antidepressants.  Order will be placed to restart the Cymbalta that had previously been prescribed.  Also we can increase the mirtazapine to 45 mg.  We will continue to follow as needed  Disposition: Supportive therapy provided about ongoing stressors.  Mordecai Rasmussen, MD 12/09/2020 7:14 PM

## 2020-12-09 NOTE — Progress Notes (Signed)
PHARMACY CONSULT NOTE  Pharmacy Consult for Electrolyte Monitoring and Replacement   Recent Labs: Potassium (mmol/L)  Date Value  12/09/2020 3.6   Magnesium (mg/dL)  Date Value  65/46/5035 1.8   Calcium (mg/dL)  Date Value  46/56/8127 8.3 (L)   Albumin (g/dL)  Date Value  51/70/0174 1.8 (L)   Phosphorus (mg/dL)  Date Value  94/49/6759 2.4 (L)   Sodium (mmol/L)  Date Value  12/09/2020 140   Assessment: 65 year old female transferred to the ICU 3/1. Patient with generalized deconditioning and failure to thrive. Dysphagia limiting safe PO intake, plan for G-tube placement. Pharmacy consult for electrolyte replacement.  Access: CVC triple lumen in LIJ Diet: NPO, pending G-tube placement MIVF: D5LR at 125 mL/hr  Expect patient will be at risk for re-feeding when enteral access is established and feeds are started  Goal of Therapy:  K > 4 Mg > 2 All other electrolytes within normal limits  Plan:  3/3 AM labs: Na 140, K 3.6, Cl 114, Co2 18, Phos 2.4, Mg 1.8, Scr 1.29 --IV potassium phosphate 15 mmol x 1 (contains 22.5 mEq K+) --IV magnesium sulfate 2 g x 1 --No further electrolyte replacement warranted at this time --Will follow-up electrolytes with AM labs   Tressie Ellis 12/09/2020 8:07 AM

## 2020-12-09 NOTE — Progress Notes (Addendum)
Nutrition Follow Up Note   DOCUMENTATION CODES:   Obesity unspecified  INTERVENTION:   Once G-tube in place and ready to use, recommend:  Osmolite 1.5- 6 cans daily- Initiate with 1/2 can 6 times daily and advance as tolerated. Flush with 62m of water before and after each feed.   Pro-Source 475mBID via tube, provides 40kcal and 11g of protein per serving   Regimen provides 2210kcal/day, 112g/day protein and 168618may free water   Pt at high refeed risk; recommend monitor potassium, magnesium and phosphorus labs daily until stable  Recommend vitamin C 250m58mD via tube  If nasogastric tube placed, recommend:  Osmolite 1.5 _0 /hr- Initiate at 30ml37mand increase by 10ml/41m 12 hours until goal rate is reached.   Free water flushes 100ml q100murs   Pro-Source 45ml BI71ma tube, provides 40kcal and 11g of protein per serving   Regimen provides 2240kcal/day 112g/day protein and 1697ml/day49me water   NUTRITION DIAGNOSIS:   Inadequate oral intake related to lethargy/confusion as evidenced by NPO status. -ongoing  GOAL:   Patient will meet greater than or equal to 90% of their needs  -not met   MONITOR:   Diet advancement,Labs,Weight trends,TF tolerance,Skin,I & O's  ASSESSMENT:   65 y.o. A3[place]-American female with medical history significant for osteoarthritis, type 2 diabetes mellitus, hypertension and COVID-19 in September 2021 who is admitted with failure to thrive  Pt remains NPO. Plan is for IR G-tube today. If pt is unable to have G-tube placed today, recommend placement of a small bore nasogastric feeding tube and feeds. Pt remains at high refeed risk.   Medications reviewed and include: insulin, remeron, LRS w/ 5% dextrose _1 /hr, Mg sulfate, KPhos  Labs reviewed: K 3.6 wnl, creat 1.29(H), P 2.4 (L), Mg 1.8 wnl Wbc-13.9(H), Hgb 6.3(L), Hct 19.1(L) cbgs- 158, 157, 147 x 24 hrs  Diet Order:   Diet Order            Diet NPO time specified   Diet effective now                EDUCATION NEEDS:   No education needs have been identified at this time  Skin:  Skin Assessment: Reviewed RN Assessment (ecchymosis)  Last BM:  2/25  Height:   Ht Readings from Last 1 Encounters:  12/03/20 _2  (1.651 m)    Weight:   Wt Readings from Last 1 Encounters:  12/09/20 112 kg    Ideal Body Weight:  56.8 kg  BMI:  Body mass index is 41.1 kg/m.  Estimated Nutritional Needs:   Kcal:  2000-2300kcal/day  Protein:  100-115g/day  Fluid:  1.7L-2.0L/day  Casey CamKoleen DistanceLDN Please refer to [facility]/or RD on-call/weekend/after hours pager

## 2020-12-09 NOTE — Progress Notes (Addendum)
PROGRESS NOTE    Chelsea Glass  YHC:[id number] DOB: 05-Feb-1956 DOA: 12/03/2020 PCP: [facility]    Brief Narrative:  Chelsea Glass is a 65 y.o. African-American female with medical history significant for osteoarthritis, type 2 diabetes mellitus and hypertension, as well as COVID-19 in September 2021, who presented to the emergency room with acute onset of altered mental status and not acting herself for the last 4 days with excessive sleepiness. She has been having nausea and dry heaves without abdominal pain.  Her daughter stated that she has not eaten much and has been bed bound since September 2021 since her COVID-19 infection. Patient was admitted to the hospitalist for further work-up and treatment of acute metabolic encephalopathy and failure to thrive  On 3/1-pt was tachycardic with HR in 140's, transition to atrial fibrillation with RVR, with some transient hypotension requiring transfer to the ICU.  Hypotension resolved with 500 cc normal saline.  PCCM was consulted for further assistance along with placement of central venous access due to lack of peripheral IV access.   Cardiology consulted on 3/1-recom amiodarone loading dose wihtinfusion. Also placed orders for dig ivp if hr dosent stabilized. Heparin infusion for Chadsvas score 4.  3/2-hospitalist pickup   3/3-Hg 6.5 this am, repeat Hg 7.4 3.4-hemoglobin 6.4 and repeat 6.3.  Talk to daughter she is agreeable to blood transfusion.  Low urine output    consultants:   PCCM, psych, surgery, cardiology, palliative  Procedures:   Antimicrobials:       Subjective: Patient sleeping, responds to her name but really does not answer my questions this a.m.  Objective: Vitals:   12/09/20 0409 12/09/20 0518 12/09/20 0522 12/09/20 0726  BP: (!) 86/52 (!) 86/57 (!) 88/55 (!) 84/59  Pulse: 70 78 78 75  Resp: 16   18  Temp: 97.6 F (36.4 C)   97.9 F (36.6 C)  TempSrc: Oral     SpO2: 98% 100% 100%  90%  Weight:      Height:        Intake/Output Summary (Last 24 hours) at 12/09/2020 0805 Last data filed at 12/09/2020 0500 Gross per 24 hour  Intake 0 ml  Output 225 ml  Net -225 ml   Filed Weights   12/07/20 0458 12/08/20 0349 12/09/20 0045  Weight: 102 kg 103 kg 112 kg    Examination: Sleepy in bed, nad cta no w/r/r Regular s1/s2 Soft bening +bs  No edema Mood and affect appropriate in current setting     Data Reviewed: I have personally reviewed following labs and imaging studies  CBC: Recent Labs  Lab 12/05/20 0432 12/06/20 0754 12/07/20 0730 12/08/20 0810 12/08/20 0930 12/09/20 0615  WBC 12.2* 9.4 10.8* 13.3*  --  13.0*  NEUTROABS 6.9  --   --   --   --   --   HGB 13.1 10.8* 9.6* 6.5* 7.4* 6.4*  HCT 37.9 30.8* 28.0* 19.1* 21.9* 18.6*  MCV 83.1 82.4 84.1 83.8  --  84.5  PLT 235 269 247 214  --  210   Basic Metabolic Panel: Recent Labs  Lab 12/03/20 1301 12/03/20 1437 12/06/20 0505 12/06/20 1647 12/07/20 0437 12/07/20 1642 12/08/20 0501 12/09/20 0615  NA 136  --  143  --  139  --  141 140  K 3.1*  --  3.1*  3.1*  --  3.9  --  3.3* 3.6  CL 102  --  114*  --  113*  --  115* 114*  CO2 18*  --  19*  --  19*  --  19* 18*  GLUCOSE 124*  --  94  --  161*  --  191* 157*  BUN 29*  --  23  --  20  --  19 17  CREATININE 1.46*  --  1.21*  --  1.05*  --  1.35* 1.29*  CALCIUM 9.7  --  8.8*  --  8.5*  --  8.5* 8.3*  MG  --    < >  --  2.2 1.9 1.7 1.6* 1.8  PHOS  --    < >  --  1.6* 2.4* 1.8* 2.0* 2.4*   < > = values in this interval not displayed.   GFR: Estimated Creatinine Clearance: 53.5 mL/min (A) (by C-G formula based on SCr of 1.29 mg/dL (H)). Liver Function Tests: Recent Labs  Lab 12/03/20 1301 12/06/20 1647 12/07/20 0437 12/08/20 0501 12/09/20 0615  AST 37 23 25 27  33  ALT 37 34 34 35 41  ALKPHOS 366* 239* 250* 195* 180*  BILITOT 2.2* 1.0 0.5 0.6 0.7  PROT 6.4* 4.9* 4.9* 4.3* 4.2*  ALBUMIN 2.6* 2.0* 2.0* 1.8* 1.8*   Recent Labs  Lab  12/03/20 1301  LIPASE 48   No results for input(s): AMMONIA in the last 168 hours. Coagulation Profile: Recent Labs  Lab 12/06/20 1647  INR 1.3*   Cardiac Enzymes: No results for input(s): CKTOTAL, CKMB, CKMBINDEX, TROPONINI in the last 168 hours. BNP (last 3 results) No results for input(s): PROBNP in the last 8760 hours. HbA1C: No results for input(s): HGBA1C in the last 72 hours. CBG: Recent Labs  Lab 12/08/20 1631 12/08/20 2036 12/09/20 0009 12/09/20 0436 12/09/20 0728  GLUCAP 163* 164* 158* 157* 147*   Lipid Profile: No results for input(s): CHOL, HDL, LDLCALC, TRIG, CHOLHDL, LDLDIRECT in the last 72 hours. Thyroid Function Tests: No results for input(s): TSH, T4TOTAL, FREET4, T3FREE, THYROIDAB in the last 72 hours. Anemia Panel: No results for input(s): VITAMINB12, FOLATE, FERRITIN, TIBC, IRON, RETICCTPCT in the last 72 hours. Sepsis Labs: Recent Labs  Lab 12/03/20 1437 12/06/20 1647  PROCALCITON 0.17 <0.10    Recent Results (from the past 240 hour(s))  Resp Panel by RT-PCR (Flu A&B, Covid) Nasopharyngeal Swab     Status: None   Collection Time: 12/03/20  1:35 PM   Specimen: Nasopharyngeal Swab; Nasopharyngeal(NP) swabs in vial transport medium  Result Value Ref Range Status   SARS Coronavirus 2 by RT PCR NEGATIVE NEGATIVE Final    Comment: (NOTE) SARS-CoV-2 target nucleic acids are NOT DETECTED.  The SARS-CoV-2 RNA is generally detectable in upper respiratory specimens during the acute phase of infection. The lowest concentration of SARS-CoV-2 viral copies this assay can detect is 138 copies/mL. A negative result does not preclude SARS-Cov-2 infection and should not be used as the sole basis for treatment or other patient management decisions. A negative result may occur with  improper specimen collection/handling, submission of specimen other than nasopharyngeal swab, presence of viral mutation(s) within the areas targeted by this assay, and  inadequate number of viral copies(<138 copies/mL). A negative result must be combined with clinical observations, patient history, and epidemiological information. The expected result is Negative.  Fact Sheet for Patients:  12/05/20  Fact Sheet for Healthcare Providers:  BloggerCourse.com  This test is no t yet approved or cleared by the [place] FDA and  has been authorized for detection and/or diagnosis of SARS-CoV-2 by FDA under an Emergency Use Authorization (  EUA). This EUA will remain  in effect (meaning this test can be used) for the duration of the COVID-19 declaration under Section 564(b)(1) of the Act, 21 U.S.C.section 360bbb-3(b)(1), unless the authorization is terminated  or revoked sooner.       Influenza A by PCR NEGATIVE NEGATIVE Final   Influenza B by PCR NEGATIVE NEGATIVE Final    Comment: (NOTE) The Xpert Xpress SARS-CoV-2/FLU/RSV plus assay is intended as an aid in the diagnosis of influenza from Nasopharyngeal swab specimens and should not be used as a sole basis for treatment. Nasal washings and aspirates are unacceptable for Xpert Xpress SARS-CoV-2/FLU/RSV testing.  Fact Sheet for Patients: BloggerCourse.com  Fact Sheet for Healthcare Providers: SeriousBroker.it  This test is not yet approved or cleared by the [place] FDA and has been authorized for detection and/or diagnosis of SARS-CoV-2 by FDA under an Emergency Use Authorization (EUA). This EUA will remain in effect (meaning this test can be used) for the duration of the COVID-19 declaration under Section 564(b)(1) of the Act, 21 U.S.C. section 360bbb-3(b)(1), unless the authorization is terminated or revoked.  Performed at [facility], [address] [place], [place] [address]   CULTURE, BLOOD (ROUTINE X 2) w Reflex to ID Panel     Status: None (Preliminary result)    Collection Time: 12/06/20 10:55 AM   Specimen: BLOOD  Result Value Ref Range Status   Specimen Description BLOOD LEFT WRIST  Final   Special Requests   Final    BOTTLES DRAWN AEROBIC ONLY Blood Culture adequate volume   Culture   Final    NO GROWTH 2 DAYS Performed at [facility], [address], [place], [place] [address]    Report Status PENDING  Incomplete  CULTURE, BLOOD (ROUTINE X 2) w Reflex to ID Panel     Status: None (Preliminary result)   Collection Time: 12/06/20 11:05 AM   Specimen: BLOOD  Result Value Ref Range Status   Specimen Description BLOOD RIGHT WRIST  Final   Special Requests   Final    BOTTLES DRAWN AEROBIC ONLY Blood Culture results may not be optimal due to an inadequate volume of blood received in culture bottles   Culture   Final    NO GROWTH 2 DAYS Performed at [facility], [address] [place], [place] [address]    Report Status PENDING  Incomplete  MRSA PCR Screening     Status: None   Collection Time: 12/06/20  1:18 PM   Specimen: Nasal Mucosa; Nasopharyngeal  Result Value Ref Range Status   MRSA by PCR NEGATIVE NEGATIVE Final    Comment:        The GeneXpert MRSA Assay (FDA approved for NASAL specimens only), is one component of a comprehensive MRSA colonization surveillance program. It is not intended to diagnose MRSA infection nor to guide or monitor treatment for MRSA infections. Performed at [facility] [address], [place], [place] [address]   Urine Culture     Status: None   Collection Time: 12/06/20  1:18 PM   Specimen: Urine, Random  Result Value Ref Range Status   Specimen Description   Final    URINE, RANDOM Performed at [facility], [address] [place], [place] [address]    Special Requests   Final    NONE Performed at [facility], [address] [place], [place] [address]    Culture   Final    NO GROWTH Performed at [facility], [address] [place], [place] [address]    Report Status 12/08/2020 FINAL  Final         Radiology Studies: US RENAL  Result Date: 12/08/2020 CLINICAL DATA:  Acute renal failure EXAM: RENAL / URINARY TRACT ULTRASOUND COMPLETE COMPARISON:  CT abdomen 12/03/2020 FINDINGS: Right Kidney: Renal measurements: 8.0 x 4.6 x 5.3 cm = volume: 103 mL. Negative for obstruction. Renal cortical echogenicity normal. 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 Fatty lesion noted in this area on CT. Left Kidney: Renal measurements: 8.7 x 4.7 x 5.1 cm = volume: 108 mL. Echogenicity within normal limits. No mass or hydronephrosis visualized. Bladder: Empty bladder with Foley catheter Other: Fatty liver. IMPRESSION: Small kidneys bilaterally.  No renal obstruction. 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 Electronically Signed   By: Marlan Palau M.D.   On: 12/08/2020 13:39        Scheduled Meds: . Chlorhexidine Gluconate Cloth  6 each Topical Daily  . diclofenac Sodium  2 g Topical QID  . insulin aspart  0-9 Units Subcutaneous Q4H  . lidocaine  1 patch Transdermal Q24H  . mirtazapine  30 mg Oral QHS  . OLANZapine zydis  5 mg Oral QHS   Continuous Infusions: . amiodarone 30 mg/hr (12/09/20 0029)  . dextrose 5% lactated ringers 125 mL/hr at 12/09/20 0458    Assessment & Plan:   Principal Problem:   Depression Active Problems:   AKI (acute kidney injury) (HCC)   Adult failure to thrive   Hypokalemia   Weight loss   Altered mental status   Tachycardia   Hypotension   Chelsea Thompsonis a 65 y.o.African-American femalewith medical history significant forosteoarthritis, type 2 diabetes mellitus and hypertension, as well as COVID-19 in September 2021, who presented to the emergency room with acute onset of altered mental status and not acting herself for the last 4 days with excessive sleepiness.She has been having nausea and dry heaves without  abdominal pain. Her daughter stated that she has not eaten much and has been bed bound since September 2021 since her COVID-19 infection.    Adult failure to thrive/poor PO intake/generalized deconditioning Hypokalemia/depressed mood -- patient came in with ongoing weight loss and poor PO intake per daughter. Since COVID infection in September 2021 per daughter patient has been going downhill. --She was admitted at [facility] after discharge from [facility] with adult failure to thrive and poor PO intake. Patient also went to rehab in [place]. -- She recently was seen in [facility] emergency room on 10th of February she was discharged to go home with home health and daughter thinking patient will hopefully do better with her mood and eating. -- Daughter says patient has lost more than hundred pounds since September 2021. She has not ambulated and no interest in any acitivity. Stays in bed most of the time --she is requesting if patient can get a feeding tube -- dietitian consult-- for PEG/NG feeding -- speech therapy-- patient showed very little interest in participating with speech therapy. Pt has Dsyphagia and at risk for aspiration. --spoke with IR RN regarding PICC line and G-tube/peg tube placement  --discussed with daughter risk and complications of feeding tube, she voiced understanding patient's daughter will sign the consent. --3/1-- patient is tachycardic despite IV fluids and IV metoprolol 5 mg x1 dose soft blood pressure and continued encephalopathy. Will's transfer her to ICU --spoke with ICU NP Jeri Modena  to follow pt.--pt will need Central line due to poor IV access. 3/2-TRH pickup.  3/4-Plan for IR for Peg placement today pending  We will need to transfuse prior to getting PEG placement      Hypotension with sinus tachycardia, acute renal failure hypokalemia AKI improved with ivf...>up mildly again at 1.35, possibly prerenal due to hypotension 3/4-she is anemic and still  hypotensive  Continue IV fluids  Stool occult pending  We will transfuse 1 unit PRBC Replace electrolytes as needed    Atrial fib-rvr Was started on amiodarone infusion .  Cards following Now converted to NSR 3/4 heparin was discontinued on 3/3 due to drop in hemoglobin  We will continue to hold heparin  Once able to will transition amiodarone drip to p.o.  Cards following  Beta-blockers was discontinued due to hypotension    Acute drop in H/H-initial hemoglobin this a.m. was 6.5 from 9.6.  Repeat is 7.4. Will hold Toradol, heparin drip Check stool occult to rule out GI bleed Transfuse if hg 7 or >. Discussed this with pt's daughter as we need consent, discussed complications of transfusion including transfusion reaction and even infection, she verbalized an understanding. 3/4 will transfuse 1 unit prbc now.    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 Neuro- recommends discussed with neurosurgery. --3/1-- per Dr Adriana Simasook "This looks rather stable compared to CT from previous. This is likely benign, possible even cystic. She would need a contrasted image to delineate. No urgent intervention needed as we know it has been there for some time. Unrelated to current issues and wont cause encephalopathy  3/2-f/u neurosurgery as outpt to discuss options going forward once she recovers from acute illness. No intervention recommended at this time.    Abnormal ultrasound abdomen with gallbladder distention similar to previous imaging studies -- clinically patient does not appear to be septic or having any infection -- discontinue IV antibiotics-- no source of infection noted  so far, chest x-ray negative for pneumonia. Appears to have some opacities from previous COVID infection.sats are more than 92% on room air 3/2-surgery Dr. Lady Garyannon - no surgical indication at present for gallbladder. Labs stable.     Depressed mood -- continue Cymbalta , remeron --  psych consult with Dr. Toni Amendlapacs.   -- [facility] April 2022  Type II diabetes with sugars controlled -- sliding scale insulin -- hold PO diabetes meds  History of tachycardia/SVT -- [facility] -- recently started on Toprol-XL hundred milligrams daily. With soft blood pressure decreased dose to 25 mg daily..>will dc as bp low  Generalized deconditioning -- PT evaluation noted--rec SNF  Palliative following   DVT prophylaxis: scd Code Status:full Family Communication: daugter updated  Status is: Inpatient  Patient is inpatient as requiring the severity of her illness needing hospitalization and IV treatment  Dispo: The patient is from: Home              Anticipated d/c is to: SNF              Patient currently is not medically stable to d/c.   Difficult to place patient No  Pt still needing acute care,  getting peg placement, pt current npo, no NGT ,  needs snf. D/c likely >3 days            LOS: 6 days   Time spent: 35 min with >50% on coc    Lynn Ito, MD [facility] Pager [phone]  If 7PM-7AM, please contact night-coverage 12/09/2020, 8:05 AM

## 2020-12-09 NOTE — Sedation Documentation (Signed)
Second time out complete

## 2020-12-09 NOTE — Progress Notes (Signed)
PT Cancellation Note  Patient Details Name: Chelsea Glass MRN: [id number] DOB: Feb 23, 1956   Cancelled Treatment:    Reason Eval/Treat Not Completed: Medical issues which prohibited therapy   Session held today.  BP 60/80, HgB 6.3 this am.  Will continue as appropriate at a later date.   Danielle Dess 12/09/2020, 12:42 PM

## 2020-12-09 NOTE — Plan of Care (Signed)
  Problem: Clinical Measurements: Goal: Will remain free from infection Outcome: Progressing Goal: Respiratory complications will improve Outcome: Progressing   Problem: Coping: Goal: Level of anxiety will decrease Outcome: Progressing   Problem: Pain Managment: Goal: General experience of comfort will improve Outcome: Progressing   Problem: Safety: Goal: Ability to remain free from injury will improve Outcome: Progressing   

## 2020-12-09 NOTE — Progress Notes (Signed)
[place], [place] 12/09/20  Subjective:   LOS: 6 Chelsea Glass is a 65 year old African-American female with a past medical history of type 2 diabetes, hypertension, osteoarthritis, and COVID-19 in September 2021.She presents to the emergency room with altered mental status and excessive drowsiness for 4 days. She also reports some nausea with dry heaves without abdominal pain.  She has not eaten much and become bedbound since COVID-19 per daughter.  She is admitted with aspiration pneumonia.  Patient seen resting in bed  Alert and able to answer simple questions Says she hasn't eaten much, no appetite Denies nausea as a cause Denies shortness of breath  LR_0  ml/hr Amiodarone 67m/hr Foley- 100 ml UOP-125 ml  Objective:  Vital signs in last 24 hours:  Temp:  [97.5 F (36.4 C)-97.9 F (36.6 C)] 97.9 F (36.6 C) (03/04 0726) Pulse Rate:  [70-88] 72 (03/04 1315) Resp:  [10-18] 12 (03/04 1315) BP: (80-119)/(52-74) 92/74 (03/04 1315) SpO2:  [90 %-100 %] 100 % (03/04 1315) Weight:  [[812kg] 112 kg (03/04 0045)  Weight change: 9.038 kg Filed Weights   12/07/20 0458 12/08/20 0349 12/09/20 0045  Weight: 102 kg 103 kg 112 kg    Intake/Output:    Intake/Output Summary (Last 24 hours) at 12/09/2020 1353 Last data filed at 12/09/2020 0500 Gross per 24 hour  Intake 0 ml  Output 225 ml  Net -225 ml     Physical Exam: General: Ill appearing, no acute distress  HEENT Normocephalic  Pulm/lungs Basilar wheeze  CVS/Heart S1-S2 present, regular rate  Abdomen:  Soft, nontender  Extremities: Minimal peripheral edema  Neurologic: Alert, answers simple questions  Skin: No masses or rashes          Basic Metabolic Panel:  Recent Labs  Lab 12/03/20 1301 12/03/20 1437 12/06/20 0505 12/06/20 1647 12/07/20 0437 12/07/20 1642 12/08/20 0501 12/09/20 0615  NA 136  --  143  --  139  --  141 140  K 3.1*  --  3.1*  3.1*  --  3.9  --  3.3* 3.6   CL 102  --  114*  --  113*  --  115* 114*  CO2 18*  --  19*  --  19*  --  19* 18*  GLUCOSE 124*  --  94  --  161*  --  191* 157*  BUN 29*  --  23  --  20  --  19 17  CREATININE 1.46*  --  1.21*  --  1.05*  --  1.35* 1.29*  CALCIUM 9.7  --  8.8*  --  8.5*  --  8.5* 8.3*  MG  --    < >  --  2.2 1.9 1.7 1.6* 1.8  PHOS  --    < >  --  1.6* 2.4* 1.8* 2.0* 2.4*   < > = values in this interval not displayed.     CBC: Recent Labs  Lab 12/05/20 0432 12/06/20 0754 12/07/20 0730 12/08/20 0810 12/08/20 0930 12/09/20 0615 12/09/20 0805  WBC 12.2* 9.4 10.8* 13.3*  --  13.0*  --   NEUTROABS 6.9  --   --   --   --   --   --   HGB 13.1 10.8* 9.6* 6.5* 7.4* 6.4* 6.3*  HCT 37.9 30.8* 28.0* 19.1* 21.9* 18.6* 19.1*  MCV 83.1 82.4 84.1 83.8  --  84.5  --   PLT 235 269 247 214  --  210  --  Lab Results  Component Value Date   HEPBSAG NON REACTIVE 06/13/2020   HEPBIGM NON REACTIVE 06/13/2020      Microbiology:  Recent Results (from the past 240 hour(s))  Resp Panel by RT-PCR (Flu A&B, Covid) Nasopharyngeal Swab     Status: None   Collection Time: 12/03/20  1:35 PM   Specimen: Nasopharyngeal Swab; Nasopharyngeal(NP) swabs in vial transport medium  Result Value Ref Range Status   SARS Coronavirus 2 by RT PCR NEGATIVE NEGATIVE Final    Comment: (NOTE) SARS-CoV-2 target nucleic acids are NOT DETECTED.  The SARS-CoV-2 RNA is generally detectable in upper respiratory specimens during the acute phase of infection. The lowest concentration of SARS-CoV-2 viral copies this assay can detect is 138 copies/mL. A negative result does not preclude SARS-Cov-2 infection and should not be used as the sole basis for treatment or other patient management decisions. A negative result may occur with  improper specimen collection/handling, submission of specimen other than nasopharyngeal swab, presence of viral mutation(s) within the areas targeted by this assay, and inadequate number of  viral copies(<138 copies/mL). A negative result must be combined with clinical observations, patient history, and epidemiological information. The expected result is Negative.  Fact Sheet for Patients:  EntrepreneurPulse.com.au  Fact Sheet for Healthcare Providers:  IncredibleEmployment.be  This test is no t yet approved or cleared by the [place] FDA and  has been authorized for detection and/or diagnosis of SARS-CoV-2 by FDA under an Emergency Use Authorization (EUA). This EUA will remain  in effect (meaning this test can be used) for the duration of the COVID-19 declaration under Section 564(b)(1) of the Act, 21 U.S.C.section 360bbb-3(b)(1), unless the authorization is terminated  or revoked sooner.       Influenza A by PCR NEGATIVE NEGATIVE Final   Influenza B by PCR NEGATIVE NEGATIVE Final    Comment: (NOTE) The Xpert Xpress SARS-CoV-2/FLU/RSV plus assay is intended as an aid in the diagnosis of influenza from Nasopharyngeal swab specimens and should not be used as a sole basis for treatment. Nasal washings and aspirates are unacceptable for Xpert Xpress SARS-CoV-2/FLU/RSV testing.  Fact Sheet for Patients: EntrepreneurPulse.com.au  Fact Sheet for Healthcare Providers: IncredibleEmployment.be  This test is not yet approved or cleared by the [place] FDA and has been authorized for detection and/or diagnosis of SARS-CoV-2 by FDA under an Emergency Use Authorization (EUA). This EUA will remain in effect (meaning this test can be used) for the duration of the COVID-19 declaration under Section 564(b)(1) of the Act, 21 U.S.C. section 360bbb-3(b)(1), unless the authorization is terminated or revoked.  Performed at [facility], [address] [place], [place] [address]   CULTURE, BLOOD (ROUTINE X 2) w Reflex to ID Panel     Status: None (Preliminary result)   Collection Time:  12/06/20 10:55 AM   Specimen: BLOOD  Result Value Ref Range Status   Specimen Description BLOOD LEFT WRIST  Final   Special Requests   Final    BOTTLES DRAWN AEROBIC ONLY Blood Culture adequate volume   Culture   Final    NO GROWTH 3 DAYS Performed at [facility], [address], [place], [place] [address]    Report Status PENDING  Incomplete  CULTURE, BLOOD (ROUTINE X 2) w Reflex to ID Panel     Status: None (Preliminary result)   Collection Time: 12/06/20 11:05 AM   Specimen: BLOOD  Result Value Ref Range Status   Specimen Description BLOOD RIGHT WRIST  Final  Special Requests   Final    BOTTLES DRAWN AEROBIC ONLY Blood Culture results may not be optimal due to an inadequate volume of blood received in culture bottles   Culture   Final    NO GROWTH 3 DAYS Performed at [facility], [address] [place], [place] [address]    Report Status PENDING  Incomplete  MRSA PCR Screening     Status: None   Collection Time: 12/06/20  1:18 PM   Specimen: Nasal Mucosa; Nasopharyngeal  Result Value Ref Range Status   MRSA by PCR NEGATIVE NEGATIVE Final    Comment:        The GeneXpert MRSA Assay (FDA approved for NASAL specimens only), is one component of a comprehensive MRSA colonization surveillance program. It is not intended to diagnose MRSA infection nor to guide or monitor treatment for MRSA infections. Performed at [facility], [address] [place], [place] [address]   Urine Culture     Status: None   Collection Time: 12/06/20  1:18 PM   Specimen: Urine, Random  Result Value Ref Range Status   Specimen Description   Final    URINE, RANDOM Performed at [facility], [address], [place], [place] [address]    Special Requests   Final    NONE Performed at [facility], [address], [place], [place] [address]    Culture   Final    NO GROWTH Performed at [facility], [place] [address], [place], [place]  [address]    Report Status 12/08/2020 FINAL  Final    Coagulation Studies: Recent Labs    12/06/20 1647  LABPROT 15.8*  INR 1.3*    Urinalysis: No results for input(s): COLORURINE, LABSPEC, PHURINE, GLUCOSEU, HGBUR, BILIRUBINUR, KETONESUR, PROTEINUR, UROBILINOGEN, NITRITE, LEUKOCYTESUR in the last 72 hours.  Invalid input(s): APPERANCEUR    Imaging: IR GASTROSTOMY TUBE MOD SED  Result Date: 12/09/2020 INDICATION: Dysphagia. Failure to thrive. Please perform percutaneous gastrostomy tube placement for enteric nutrition supplementation purposes. EXAM: PULL TROUGH GASTROSTOMY TUBE PLACEMENT COMPARISON:  CT abdomen and pelvis-12/03/2020 MEDICATIONS: Glucagon 1 mg IV CONTRAST:  20 cc of Omnipaque 300 administered into the gastric lumen. ANESTHESIA/SEDATION: Moderate (conscious) sedation was employed during this procedure. A total of Versed 0.5 mg and Fentanyl 25 mcg was administered intravenously. Moderate Sedation Time: 10 minutes. The patient's level of consciousness and vital signs were monitored continuously by radiology nursing throughout the procedure under my direct supervision. FLUOROSCOPY TIME:  3 minutes, 6 seconds (28.3 mGy) COMPLICATIONS: None immediate. PROCEDURE: Informed written consent was obtained from the patient's daughter following explanation of the procedure, risks, benefits and alternatives. A time out was performed prior to the initiation of the procedure. Ultrasound scanning was performed to demarcate the edge of the left lobe of the liver. Maximal barrier sterile technique utilized including caps, mask, sterile gowns, sterile gloves, large sterile drape, hand hygiene and Betadine prep. The left upper quadrant was sterilely prepped and draped. An oral gastric catheter was inserted into the stomach under fluoroscopy. The existing nasogastric feeding tube was removed. The left costal margin and air opacified transverse colon were identified and avoided. Air was injected into the  stomach for insufflation and visualization under fluoroscopy. Under sterile conditions a 17 gauge trocar needle was utilized to access the stomach percutaneously beneath the left subcostal margin after the overlying soft tissues were anesthetized with 1% Lidocaine with epinephrine. Needle position was confirmed within the stomach with aspiration of air and  injection of small amount of contrast. A single T tack was deployed for gastropexy. Over an Amplatz guide wire, a 9-French sheath was inserted into the stomach. A snare device was utilized to capture the oral gastric catheter. The snare device was pulled retrograde from the stomach up the esophagus and out the oropharynx. The 20-French pull-through gastrostomy was connected to the snare device and pulled antegrade through the oropharynx down the esophagus into the stomach and then through the percutaneous tract external to the patient. The gastrostomy was assembled externally. Contrast injection confirms position in the stomach. Several spot radiographic images were obtained in various obliquities for documentation. The patient tolerated procedure well without immediate post procedural complication. FINDINGS: After successful fluoroscopic guided placement, the gastrostomy tube is appropriately positioned with internal disc against the ventral aspect of the gastric lumen. IMPRESSION: Successful fluoroscopic insertion of a 20-French pull-through gastrostomy tube. The gastrostomy may be used immediately for medication administration and in 24 hrs for the initiation of feeds. Electronically Signed   By: Sandi Mariscal M.D.   On: 12/09/2020 13:28   US RENAL  Result Date: 12/08/2020 CLINICAL DATA:  Acute renal failure EXAM: RENAL / URINARY TRACT ULTRASOUND COMPLETE COMPARISON:  CT abdomen 12/03/2020 FINDINGS: Right Kidney: Renal measurements: 8.0 x 4.6 x 5.3 cm = volume: 103 mL. Negative for obstruction. Renal cortical echogenicity normal. 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 Fatty lesion noted in this area on CT. Left Kidney: Renal measurements: 8.7 x 4.7 x 5.1 cm = volume: 108 mL. Echogenicity within normal limits. No mass or hydronephrosis visualized. Bladder: Empty bladder with Foley catheter Other: Fatty liver. IMPRESSION: Small kidneys bilaterally.  No renal obstruction. 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 Electronically Signed   By: Franchot Gallo M.D.   On: 12/08/2020 13:39   IR Fluoro Guide CV Line Right  Result Date: 12/09/2020 INDICATION: Poor venous access.  Need PICC for IV therapies EXAM: ULTRASOUND AND FLUOROSCOPIC GUIDED PICC LINE INSERTION MEDICATIONS: 1% lidocaine 2 mL CONTRAST:  None FLUOROSCOPY TIME:  66 seconds COMPLICATIONS: None immediate. TECHNIQUE: The procedure, risks, benefits, and alternatives were explained to the patient and informed written consent was obtained. The right upper extremity was prepped with chlorhexidine in a sterile fashion, and a sterile drape was applied covering the operative field. Maximum barrier sterile technique with sterile gowns and gloves were used for the procedure. A timeout was performed prior to the initiation of the procedure. Local anesthesia was provided with 1% lidocaine. After the overlying soft tissues were anesthetized with 1% lidocaine, a micropuncture kit was utilized to access the right brachial vein. Real-time ultrasound guidance was utilized for vascular access including the acquisition of a permanent ultrasound image documenting patency of the accessed vessel. A guidewire was advanced to the level of the superior caval-atrial junction for measurement purposes and the PICC line was cut to length. A peel-away sheath was placed and a 34 cm, 5 [place], dual lumen was inserted to level of the superior caval-atrial junction. A post procedure spot fluoroscopic was obtained. The catheter easily aspirated and flushed and was secured in place. A  dressing was placed. The patient tolerated the procedure well without immediate post procedural complication. FINDINGS: After catheter placement, the tip lies within the superior cavoatrial junction. The catheter aspirates and flushes normally and is ready for immediate use. IMPRESSION: Successful ultrasound and fluoroscopic guided placement of a right brachial vein approach, 34 cm, 5 [place],  dual lumen PICC with tip at the superior caval-atrial junction. The PICC line is ready for immediate use. Read by: Gareth Eagle, PA-C Electronically Signed   By: Sandi Mariscal M.D.   On: 12/09/2020 12:09     Medications:   . amiodarone 30 mg/hr (12/09/20 0029)  .  ceFAZolin (ANCEF) IV    . dextrose 5% lactated ringers 125 mL/hr at 12/09/20 0458  . potassium PHOSPHATE IVPB (in mmol)     . Chlorhexidine Gluconate Cloth  6 each Topical Daily  . diclofenac Sodium  2 g Topical QID  . fentaNYL      . glucagon (human recombinant)      . insulin aspart  0-9 Units Subcutaneous Q4H  . lidocaine  1 patch Transdermal Q24H  . midazolam      . mirtazapine  30 mg Oral QHS  . OLANZapine zydis  5 mg Oral QHS   acetaminophen **OR** acetaminophen, acetaminophen, fentaNYL (SUBLIMAZE) injection, magnesium hydroxide, ondansetron **OR** ondansetron (ZOFRAN) IV  Assessment/ Plan:  65 y.o. female with  was admitted on 12/03/2020 for  Principal Problem:   Depression Active Problems:   AKI (acute kidney injury) ([place])   Adult failure to thrive   Hypokalemia   Weight loss   Altered mental status   Tachycardia   Hypotension  AKI (acute kidney injury) ([place]) [N17.9] Altered mental status, unspecified altered mental status type [R41.82]  #. Acute Kidney Injury  -likely due to dehydration  -Baseline Creatinine 0.95/EGFR>60 on 09/21/2020 -Currently 1.35 - Renal US- small kidneys bilaterally, no obstruction, mild lipoma upper right kidney -UPEP/SPEP-awaiting collection -messaged nursing about needed specimens -Continue  IVF -No dialysis at this time -will continue to monitor labs for now  #. Anemia   Lab Results  Component Value Date   HGB 6.3 (L) 12/09/2020   Poor nutrition prior to admission and currently PEG requested per family and placed today Will monitor Primary team may treat  #. Diabetes type 2 Hgb A1c MFr Bld (%)  Date Value  12/03/2020 4.6 (L)  Glucose controlled  # Afib with RVR Amiodarone started yesterday Converted to NSR Cardiology following closely   Appreciate the consult   LOS: 6   3/4/20221:53 PM  [facility] [place], [place]

## 2020-12-09 NOTE — Procedures (Signed)
PROCEDURE SUMMARY:  Successful placement of dual lumen PICC line to right brachial vein. Length 34cm Tip at lower SVC/RA No complications PICC capped Ready for use. EBL = trace  Please see full dictation in Imaging section for details.   Mozes Sagar S Naiomi Musto PA-C 12/09/2020 12:10 PM

## 2020-12-09 NOTE — Progress Notes (Signed)
   12/09/20 1315  Assess: MEWS Score  BP 92/74  Pulse Rate 72  ECG Heart Rate 77  Resp 12  Level of Consciousness Responds to Voice  SpO2 100 %  O2 Device Room Air  Assess: MEWS Score  MEWS Temp 0  MEWS Systolic 1  MEWS Pulse 0  MEWS RR 1  MEWS LOC 1  MEWS Score 3  MEWS Score Color Yellow  Assess: if the MEWS score is Yellow or Red  Were vital signs taken at a resting state? Yes  Focused Assessment Change from prior assessment (see assessment flowsheet)  Early Detection of Sepsis Score *See Row Information* Low  MEWS guidelines implemented *See Row Information* Yes  Treat  MEWS Interventions Escalated (See documentation below)  Pain Scale 0-10  Pain Score Asleep  Take Vital Signs  Increase Vital Sign Frequency  Yellow: Q 2hr X 2 then Q 4hr X 2, if remains yellow, continue Q 4hrs  Escalate  MEWS: Escalate Yellow: discuss with charge nurse/RN and consider discussing with provider and RRT  Notify: Charge Nurse/RN  Name of Charge Nurse/RN Notified Edison Pace  Date Charge Nurse/RN Notified 12/09/20  Time Charge Nurse/RN Notified 1441  Document  Patient Outcome Other (Comment) (CN made aware of changes, Will continue to monitor and recheck vitals q2h)  Progress note created (see row info) Yes

## 2020-12-09 NOTE — Progress Notes (Addendum)
MEWS turned yellow while patient in IR for G tube placement. This RN received report from IR RN and patient's vitals at this time: BP 140/60  98% on room air  71 heart rate CBG 148 New bag of amio hung in IR and continued at 16.67 right now Ancef also given in IR prior to patient returning to Unit. Daughter awaiting patient's return at bedside.

## 2020-12-09 NOTE — Progress Notes (Signed)
SUBJECTIVE: Resting in bed. Denies chest pain or dyspnea by shaking her head no.   Vitals:   12/09/20 1230 12/09/20 1245 12/09/20 1300 12/09/20 1315  BP: (!) 80/60 100/63 104/60 92/74  Pulse: 78 77  72  Resp: 12 12 10 12   Temp:      TempSrc:      SpO2: 100% 96%  100%  Weight:      Height:        Intake/Output Summary (Last 24 hours) at 12/09/2020 1403 Last data filed at 12/09/2020 0500 Gross per 24 hour  Intake 0 ml  Output 225 ml  Net -225 ml    LABS: Basic Metabolic Panel: Recent Labs    12/08/20 0501 12/09/20 0615  NA 141 140  K 3.3* 3.6  CL 115* 114*  CO2 19* 18*  GLUCOSE 191* 157*  BUN 19 17  CREATININE 1.35* 1.29*  CALCIUM 8.5* 8.3*  MG 1.6* 1.8  PHOS 2.0* 2.4*   Liver Function Tests: Recent Labs    12/08/20 0501 12/09/20 0615  AST 27 33  ALT 35 41  ALKPHOS 195* 180*  BILITOT 0.6 0.7  PROT 4.3* 4.2*  ALBUMIN 1.8* 1.8*   No results for input(s): LIPASE, AMYLASE in the last 72 hours. CBC: Recent Labs    12/08/20 0810 12/08/20 0930 12/09/20 0615 12/09/20 0805  WBC 13.3*  --  13.0*  --   HGB 6.5*   < > 6.4* 6.3*  HCT 19.1*   < > 18.6* 19.1*  MCV 83.8  --  84.5  --   PLT 214  --  210  --    < > = values in this interval not displayed.   Cardiac Enzymes: No results for input(s): CKTOTAL, CKMB, CKMBINDEX, TROPONINI in the last 72 hours. BNP: Invalid input(s): POCBNP D-Dimer: No results for input(s): DDIMER in the last 72 hours. Hemoglobin A1C: No results for input(s): HGBA1C in the last 72 hours. Fasting Lipid Panel: No results for input(s): CHOL, HDL, LDLCALC, TRIG, CHOLHDL, LDLDIRECT in the last 72 hours. Thyroid Function Tests: No results for input(s): TSH, T4TOTAL, T3FREE, THYROIDAB in the last 72 hours.  Invalid input(s): FREET3 Anemia Panel: No results for input(s): VITAMINB12, FOLATE, FERRITIN, TIBC, IRON, RETICCTPCT in the last 72 hours.   PHYSICAL EXAM General: Well developed, well nourished, in no acute distress HEENT:   Normocephalic and atramatic Neck:  No JVD.  Lungs: Clear bilaterally to auscultation and percussion. Heart: HRRR . Normal S1 and S2 without gallops or murmurs.  Abdomen: Bowel sounds are positive, abdomen soft and non-tender  Msk:  Back normal, normal gait. Normal strength and tone for age. Extremities: No clubbing, cyanosis or edema.   Neuro: Nonverbal Psych:  Nonverbal  TELEMETRY: NSR 74/bpm  ASSESSMENT AND PLAN: Patient presenting to the emergency department with altered mental status later developed most likely aspiration pneumonia and atrial fibrillation with RVR. Patient remains in NSR with continued amiodarone infusion. G tube placement completed. Will transition to PO amiodarone dosing. Hemoglobin remains low without any overt signs of bleeding. Recommend continuing to hold heparin. Restart when able or transition to eliquis now that G tube has been placed. Will continue to follow  Principal Problem:   Depression Active Problems:   AKI (acute kidney injury) ([facility])   Adult failure to thrive   Hypokalemia   Weight loss   Altered mental status   Tachycardia   Hypotension    02/08/21, NP-C 12/09/2020 2:03 PM

## 2020-12-09 NOTE — Procedures (Signed)
Pre procedure Dx: Dysphagia; Failure to thrive Post Procedure Dx: Same  Successful fluoroscopic guided insertion of gastrostomy tube.   The gastrostomy tube may be used immediately for medications.   Tube feeds may be initiated in 24 hours as per the primary team.    EBL: Minimal Complications: None immediate  Katherina Right, MD Pager #: [phone]

## 2020-12-09 NOTE — Progress Notes (Addendum)
SLP Cancellation Note  Patient Details Name: Chelsea Glass MRN: [id number] DOB: 1956-02-21   Cancelled treatment:       Reason Eval/Treat Not Completed: Medical issues which prohibited therapy (chart reviewed). Per chart notes, Plan is for IR G-tube today. Pt remains NPO w/ Pleasure/Therapeutic ice chips post oral care w/ NSG supervision when pt is alert and attentive.  ST services will f/u w/ pt's status next week in order to recommend ST services as part of her overall POC at D/C to next venue of care.       Jerilynn Som, MS, CCC-SLP Speech Language Pathologist Rehab Services [phone] [facility] 12/09/2020, 2:46 PM

## 2020-12-10 DIAGNOSIS — F32A Depression, unspecified: Secondary | ICD-10-CM | POA: Diagnosis not present

## 2020-12-10 DIAGNOSIS — R627 Adult failure to thrive: Secondary | ICD-10-CM | POA: Diagnosis not present

## 2020-12-10 DIAGNOSIS — N179 Acute kidney failure, unspecified: Secondary | ICD-10-CM | POA: Diagnosis not present

## 2020-12-10 DIAGNOSIS — R400 Somnolence: Secondary | ICD-10-CM | POA: Diagnosis not present

## 2020-12-10 LAB — GLUCOSE, CAPILLARY
Glucose-Capillary: 151 mg/dL — ABNORMAL HIGH (ref 70–99)
Glucose-Capillary: 127 mg/dL — ABNORMAL HIGH (ref 70–99)
Glucose-Capillary: 131 mg/dL — ABNORMAL HIGH (ref 70–99)
Glucose-Capillary: 121 mg/dL — ABNORMAL HIGH (ref 70–99)
Glucose-Capillary: 154 mg/dL — ABNORMAL HIGH (ref 70–99)

## 2020-12-10 LAB — RENAL FUNCTION PANEL
Albumin: 1.5 g/dL — ABNORMAL LOW (ref 3.5–5.0)
Anion gap: 8 (ref 5–15)
BUN: 16 mg/dL (ref 8–23)
CO2: 16 mmol/L — ABNORMAL LOW (ref 22–32)
Calcium: 7.8 mg/dL — ABNORMAL LOW (ref 8.9–10.3)
Chloride: 114 mmol/L — ABNORMAL HIGH (ref 98–111)
Creatinine, Ser: 1.32 mg/dL — ABNORMAL HIGH (ref 0.44–1.00)
GFR, Estimated: 45 mL/min — ABNORMAL LOW (ref >60)
Glucose, Bld: 147 mg/dL — ABNORMAL HIGH (ref 70–99)
Phosphorus: 2.7 mg/dL (ref 2.5–4.6)
Potassium: 2.9 mmol/L — ABNORMAL LOW (ref 3.5–5.1)
Sodium: 138 mmol/L (ref 135–145)

## 2020-12-10 LAB — HEMOGLOBIN AND HEMATOCRIT, BLOOD
HCT: 20.4 % — ABNORMAL LOW (ref 36.0–46.0)
Hemoglobin: 7.1 g/dL — ABNORMAL LOW (ref 12.0–15.0)

## 2020-12-10 LAB — PREPARE RBC (CROSSMATCH)

## 2020-12-10 LAB — POTASSIUM: Potassium: 4 mmol/L (ref 3.5–5.1)

## 2020-12-10 LAB — MAGNESIUM: Magnesium: 1.7 mg/dL (ref 1.7–2.4)

## 2020-12-10 MED ORDER — OSMOLITE 1.2 CAL PO LIQD
237.0000 mL | Freq: Every day | ORAL | Status: DC
  Administered 2020-12-10: 237 mL
  Administered 2020-12-10: 118 mL
  Administered 2020-12-11 – 2020-12-12 (×8): 237 mL

## 2020-12-10 MED ORDER — MAGNESIUM SULFATE 2 GM/50ML IV SOLN
2.0000 g | Freq: Once | INTRAVENOUS | Status: AC
  Administered 2020-12-10: 2 g via INTRAVENOUS
  Filled 2020-12-10: qty 50

## 2020-12-10 MED ORDER — OSMOLITE 1.5 CAL PO LIQD: 1000.0000 mL | ORAL | Status: DC

## 2020-12-10 MED ORDER — POTASSIUM CHLORIDE 10 MEQ/100ML IV SOLN
10.0000 meq | INTRAVENOUS | Status: AC
  Administered 2020-12-10 (×4): 10 meq via INTRAVENOUS
  Filled 2020-12-10 (×4): qty 100

## 2020-12-10 MED ORDER — MIDODRINE HCL 5 MG PO TABS
2.5000 mg | ORAL_TABLET | Freq: Three times a day (TID) | ORAL | Status: DC
  Administered 2020-12-10: 2.5 mg
  Filled 2020-12-10: qty 1

## 2020-12-10 MED ORDER — SODIUM CHLORIDE 0.9 % IV SOLN
INTRAVENOUS | Status: DC | PRN
  Administered 2020-12-10 (×3): 500 mL via INTRAVENOUS

## 2020-12-10 MED ORDER — PROSOURCE TF PO LIQD
45.0000 mL | Freq: Two times a day (BID) | ORAL | Status: DC
  Administered 2020-12-10 – 2020-12-16 (×11): 45 mL
  Filled 2020-12-10 (×14): qty 45

## 2020-12-10 MED ORDER — SODIUM CHLORIDE 0.9% IV SOLUTION: Freq: Once | INTRAVENOUS | Status: DC

## 2020-12-10 NOTE — Plan of Care (Signed)
  Problem: Clinical Measurements: Goal: Will remain free from infection Outcome: Progressing   Problem: Pain Managment: Goal: General experience of comfort will improve Outcome: Progressing   Problem: Safety: Goal: Ability to remain free from injury will improve Outcome: Progressing   

## 2020-12-10 NOTE — Progress Notes (Signed)
PROGRESS NOTE    Chelsea Glass  YOV:[id number] DOB: [birth year] DOA: 12/03/2020 PCP: [facility]    Brief Narrative:  Chelsea Glass is a 65 y.o. African-American female with medical history significant for osteoarthritis, type 2 diabetes mellitus and hypertension, as well as COVID-19 in September 2021, who presented to the emergency room with acute onset of altered mental status and not acting herself for the last 4 days with excessive sleepiness. She has been having nausea and dry heaves without abdominal pain.  Her daughter stated that she has not eaten much and has been bed bound since September 2021 since her COVID-19 infection. Patient was admitted to the hospitalist for further work-up and treatment of acute metabolic encephalopathy and failure to thrive  On 3/1-pt was tachycardic with HR in 140's, transition to atrial fibrillation with RVR, with some transient hypotension requiring transfer to the ICU.  Hypotension resolved with 500 cc normal saline.  PCCM was consulted for further assistance along with placement of central venous access due to lack of peripheral IV access.   Cardiology consulted on 3/1-recom amiodarone loading dose . Also placed orders for dig ivp if hr dosent stabilized. Heparin infusion for Chadsvas score 4.  3/2-hospitalist pickup   3/3-Hg 6.5 this am, repeat Hg 7.4 3/4-hemoglobin 6.4 and repeat 6.3.  Talk to daughter she is agreeable to blood transfusion.  Low urine output  3/5-low urine output. Hg 7.1  consultants:   PCCM, psych, surgery, cardiology, palliative  Procedures:   Antimicrobials:       Subjective: Pt answering my questions. Denies sob, abd pain.  Objective: Vitals:   12/10/20 0356 12/10/20 0654 12/10/20 0751 12/10/20 0803  BP: (!) 85/58 (!) 88/57 (!) 92/59 99/61  Pulse: 78 84 87 86  Resp: _0 Temp: (!) 97.3 F (36.3 C) 98 F (36.7 C) 97.6 F (36.4 C) 98.6 F (37 C)  TempSrc: Oral Oral  Rectal   SpO2: 100% 99% 99% 100%  Weight:      Height:        Intake/Output Summary (Last 24 hours) at 12/10/2020 1538 Last data filed at 12/10/2020 1052 Gross per 24 hour  Intake 7416.17 ml  Output 100 ml  Net 7316.17 ml   Filed Weights   12/07/20 0458 12/08/20 0349 12/09/20 0045  Weight: 102 kg 103 kg 112 kg    Examination: Eyes open, cooperative, NAD laying in bed flat CTA, no wheeze rales rhonchi's Regular S1-S2 no gallops PEG tube in place soft nontender Positive generalized edema x4 Awake, grossly intact Mood and affect appropriate in current setting     Data Reviewed: I have personally reviewed following labs and imaging studies  CBC: Recent Labs  Lab 12/05/20 0432 12/06/20 0754 12/07/20 0730 12/08/20 0810 12/08/20 0930 12/09/20 0615 12/09/20 0805 12/10/20 0806  WBC 12.2* 9.4 10.8* 13.3*  --  13.0*  --   --   NEUTROABS 6.9  --   --   --   --   --   --   --   HGB 13.1 10.8* 9.6* 6.5* 7.4* 6.4* 6.3* 7.1*  HCT 37.9 30.8* 28.0* 19.1* 21.9* 18.6* 19.1* 20.4*  MCV 83.1 82.4 84.1 83.8  --  84.5  --   --   PLT 235 269 247 214  --  210  --   --    Basic Metabolic Panel: Recent Labs  Lab 12/06/20 0505 12/06/20 1647 12/07/20 0437 12/07/20 1642 12/08/20 0501 12/09/20 0615 12/10/20 0806  NA 143  --  139  --  141 140 138  K 3.1*   3.1*  --  3.9  --  3.3* 3.6 2.9*  CL 114*  --  113*  --  115* 114* 114*  CO2 19*  --  19*  --  19* 18* 16*  GLUCOSE 94  --  161*  --  191* 157* 147*  BUN 23  --  20  --  _0 CREATININE 1.21*  --  1.05*  --  1.35* 1.29* 1.32*  CALCIUM 8.8*  --  8.5*  --  8.5* 8.3* 7.8*  MG  --    < > 1.9 1.7 1.6* 1.8 1.7  PHOS  --    < > 2.4* 1.8* 2.0* 2.4* 2.7   < > = values in this interval not displayed.   GFR: Estimated Creatinine Clearance: 52.3 mL/min (A) (by C-G formula based on SCr of 1.32 mg/dL (H)). Liver Function Tests: Recent Labs  Lab 12/06/20 1647 12/07/20 0437 12/08/20 0501 12/09/20 0615 12/10/20 0806  AST _1 33  --    ALT 34 34 35 41  --   ALKPHOS 239* 250* 195* 180*  --   BILITOT 1.0 0.5 0.6 0.7  --   PROT 4.9* 4.9* 4.3* 4.2*  --   ALBUMIN 2.0* 2.0* 1.8* 1.8* 1.5*   No results for input(s): LIPASE, AMYLASE in the last 168 hours. No results for input(s): AMMONIA in the last 168 hours. Coagulation Profile: Recent Labs  Lab 12/06/20 1647  INR 1.3*   Cardiac Enzymes: No results for input(s): CKTOTAL, CKMB, CKMBINDEX, TROPONINI in the last 168 hours. BNP (last 3 results) No results for input(s): PROBNP in the last 8760 hours. HbA1C: No results for input(s): HGBA1C in the last 72 hours. CBG: Recent Labs  Lab 12/09/20 2038 12/09/20 2318 12/10/20 0355 12/10/20 0753 12/10/20 1134  GLUCAP 186* 167* 151* 127* 131*   Lipid Profile: No results for input(s): CHOL, HDL, LDLCALC, TRIG, CHOLHDL, LDLDIRECT in the last 72 hours. Thyroid Function Tests: No results for input(s): TSH, T4TOTAL, FREET4, T3FREE, THYROIDAB in the last 72 hours. Anemia Panel: No results for input(s): VITAMINB12, FOLATE, FERRITIN, TIBC, IRON, RETICCTPCT in the last 72 hours. Sepsis Labs: Recent Labs  Lab 12/06/20 1647  PROCALCITON <0.10    Recent Results (from the past 240 hour(s))  Resp Panel by RT-PCR (Flu A&B, Covid) Nasopharyngeal Swab     Status: None   Collection Time: 12/03/20  1:35 PM   Specimen: Nasopharyngeal Swab; Nasopharyngeal(NP) swabs in vial transport medium  Result Value Ref Range Status   SARS Coronavirus 2 by RT PCR NEGATIVE NEGATIVE Final    Comment: (NOTE) SARS-CoV-2 target nucleic acids are NOT DETECTED.  The SARS-CoV-2 RNA is generally detectable in upper respiratory specimens during the acute phase of infection. The lowest concentration of SARS-CoV-2 viral copies this assay can detect is 138 copies/mL. A negative result does not preclude SARS-Cov-2 infection and should not be used as the sole basis for treatment or other patient management decisions. A negative result may occur with   improper specimen collection/handling, submission of specimen other than nasopharyngeal swab, presence of viral mutation(s) within the areas targeted by this assay, and inadequate number of viral copies(<138 copies/mL). A negative result must be combined with clinical observations, patient history, and epidemiological information. The expected result is Negative.  Fact Sheet for Patients:  EntrepreneurPulse.com.au  Fact Sheet for Healthcare Providers:  IncredibleEmployment.be  This test is no t yet approved or cleared  by the [place] and  has been authorized for detection and/or diagnosis of SARS-CoV-2 by FDA under an Emergency Use Authorization (EUA). This EUA will remain  in effect (meaning this test can be used) for the duration of the COVID-19 declaration under Section 564(b)(1) of the Act, 21 U.S.C.section 360bbb-3(b)(1), unless the authorization is terminated  or revoked sooner.       Influenza A by PCR NEGATIVE NEGATIVE Final   Influenza B by PCR NEGATIVE NEGATIVE Final    Comment: (NOTE) The Xpert Xpress SARS-CoV-2/FLU/RSV plus assay is intended as an aid in the diagnosis of influenza from Nasopharyngeal swab specimens and should not be used as a sole basis for treatment. Nasal washings and aspirates are unacceptable for Xpert Xpress SARS-CoV-2/FLU/RSV testing.  Fact Sheet for Patients: EntrepreneurPulse.com.au  Fact Sheet for Healthcare Providers: IncredibleEmployment.be  This test is not yet approved or cleared by the [place] FDA and has been authorized for detection and/or diagnosis of SARS-CoV-2 by FDA under an Emergency Use Authorization (EUA). This EUA will remain in effect (meaning this test can be used) for the duration of the COVID-19 declaration under Section 564(b)(1) of the Act, 21 U.S.C. section 360bbb-3(b)(1), unless the authorization is terminated  or revoked.  Performed at [facility], [address] [place], [place] [address]   CULTURE, BLOOD (ROUTINE X 2) w Reflex to ID Panel     Status: None (Preliminary result)   Collection Time: 12/06/20 10:55 AM   Specimen: BLOOD  Result Value Ref Range Status   Specimen Description BLOOD LEFT WRIST  Final   Special Requests   Final    BOTTLES DRAWN AEROBIC ONLY Blood Culture adequate volume   Culture   Final    NO GROWTH 4 DAYS Performed at [facility], [address], [place], [place] [address]    Report Status PENDING  Incomplete  CULTURE, BLOOD (ROUTINE X 2) w Reflex to ID Panel     Status: None (Preliminary result)   Collection Time: 12/06/20 11:05 AM   Specimen: BLOOD  Result Value Ref Range Status   Specimen Description BLOOD RIGHT WRIST  Final   Special Requests   Final    BOTTLES DRAWN AEROBIC ONLY Blood Culture results may not be optimal due to an inadequate volume of blood received in culture bottles   Culture   Final    NO GROWTH 4 DAYS Performed at [facility], [address], [place], [place] [address]    Report Status PENDING  Incomplete  MRSA PCR Screening     Status: None   Collection Time: 12/06/20  1:18 PM   Specimen: Nasal Mucosa; Nasopharyngeal  Result Value Ref Range Status   MRSA by PCR NEGATIVE NEGATIVE Final    Comment:        The GeneXpert MRSA Assay (FDA approved for NASAL specimens only), is one component of a comprehensive MRSA colonization surveillance program. It is not intended to diagnose MRSA infection nor to guide or monitor treatment for MRSA infections. Performed at [facility], [address] [place], [place] [address]   Urine Culture     Status: None   Collection Time: 12/06/20  1:18 PM   Specimen: Urine, Random  Result Value Ref Range Status   Specimen Description   Final    URINE, RANDOM Performed at [facility], [address], [place], [place] [address]    Special  Requests   Final    NONE Performed at [facility], [address] [place], [place] [address]    Culture   Final    NO GROWTH Performed at [facility], [place] [address] [place], [place] [address]    Report Status 12/08/2020 FINAL  Final         Radiology Studies: IR GASTROSTOMY TUBE MOD SED  Result Date: 12/09/2020 INDICATION: Dysphagia. Failure to thrive. Please perform percutaneous gastrostomy tube placement for enteric nutrition supplementation purposes. EXAM: PULL TROUGH GASTROSTOMY TUBE PLACEMENT COMPARISON:  CT abdomen and pelvis-12/03/2020 MEDICATIONS: Glucagon 1 mg IV CONTRAST:  20 cc of Omnipaque 300 administered into the gastric lumen. ANESTHESIA/SEDATION: Moderate (conscious) sedation was employed during this procedure. A total of Versed 0.5 mg and Fentanyl 25 mcg was administered intravenously. Moderate Sedation Time: 10 minutes. The patient's level of consciousness and vital signs were monitored continuously by radiology nursing throughout the procedure under my direct supervision. FLUOROSCOPY TIME:  3 minutes, 6 seconds (79.1 mGy) COMPLICATIONS: None immediate. PROCEDURE: Informed written consent was obtained from the patient's daughter following explanation of the procedure, risks, benefits and alternatives. A time out was performed prior to the initiation of the procedure. Ultrasound scanning was performed to demarcate the edge of the left lobe of the liver. Maximal barrier sterile technique utilized including caps, mask, sterile gowns, sterile gloves, large sterile drape, hand hygiene and Betadine prep. The left upper quadrant was sterilely prepped and draped. An oral gastric catheter was inserted into the stomach under fluoroscopy. The existing nasogastric feeding tube was removed. The left costal margin and air opacified transverse colon were identified and avoided. Air was injected into the stomach for insufflation and visualization under fluoroscopy. Under  sterile conditions a 17 gauge trocar needle was utilized to access the stomach percutaneously beneath the left subcostal margin after the overlying soft tissues were anesthetized with 1% Lidocaine with epinephrine. Needle position was confirmed within the stomach with aspiration of air and injection of small amount of contrast. A single T tack was deployed for gastropexy. Over an Amplatz guide wire, a 9-French sheath was inserted into the stomach. A snare device was utilized to capture the oral gastric catheter. The snare device was pulled retrograde from the stomach up the esophagus and out the oropharynx. The 20-French pull-through gastrostomy was connected to the snare device and pulled antegrade through the oropharynx down the esophagus into the stomach and then through the percutaneous tract external to the patient. The gastrostomy was assembled externally. Contrast injection confirms position in the stomach. Several spot radiographic images were obtained in various obliquities for documentation. The patient tolerated procedure well without immediate post procedural complication. FINDINGS: After successful fluoroscopic guided placement, the gastrostomy tube is appropriately positioned with internal disc against the ventral aspect of the gastric lumen. IMPRESSION: Successful fluoroscopic insertion of a 20-French pull-through gastrostomy tube. The gastrostomy may be used immediately for medication administration and in 24 hrs for the initiation of feeds. Electronically Signed   By: Sandi Mariscal M.D.   On: 12/09/2020 13:28   IR Fluoro Guide CV Line Right  Result Date: 12/09/2020 INDICATION: Poor venous access.  Need PICC for IV therapies EXAM: ULTRASOUND AND FLUOROSCOPIC GUIDED PICC LINE INSERTION MEDICATIONS: 1% lidocaine 2 mL CONTRAST:  None FLUOROSCOPY TIME:  66 seconds COMPLICATIONS: None immediate. TECHNIQUE: The procedure, risks, benefits, and alternatives were explained to the patient and informed written  consent was obtained. The right upper extremity was prepped with chlorhexidine in a sterile fashion, and a sterile drape was applied covering the operative  field. Maximum barrier sterile technique with sterile gowns and gloves were used for the procedure. A timeout was performed prior to the initiation of the procedure. Local anesthesia was provided with 1% lidocaine. After the overlying soft tissues were anesthetized with 1% lidocaine, a micropuncture kit was utilized to access the right brachial vein. Real-time ultrasound guidance was utilized for vascular access including the acquisition of a permanent ultrasound image documenting patency of the accessed vessel. A guidewire was advanced to the level of the superior caval-atrial junction for measurement purposes and the PICC line was cut to length. A peel-away sheath was placed and a 34 cm, 5 [place], dual lumen was inserted to level of the superior caval-atrial junction. A post procedure spot fluoroscopic was obtained. The catheter easily aspirated and flushed and was secured in place. A dressing was placed. The patient tolerated the procedure well without immediate post procedural complication. FINDINGS: After catheter placement, the tip lies within the superior cavoatrial junction. The catheter aspirates and flushes normally and is ready for immediate use. IMPRESSION: Successful ultrasound and fluoroscopic guided placement of a right brachial vein approach, 34 cm, 5 French, dual lumen PICC with tip at the superior caval-atrial junction. The PICC line is ready for immediate use. Read by: Gareth Eagle, PA-C Electronically Signed   By: Sandi Mariscal M.D.   On: 12/09/2020 12:09        Scheduled Meds:  sodium chloride   Intravenous Once   amiodarone  200 mg Oral BID   Chlorhexidine Gluconate Cloth  6 each Topical Daily   diclofenac Sodium  2 g Topical QID   DULoxetine  30 mg Oral Daily   insulin aspart  0-9 Units Subcutaneous Q4H   lidocaine  1  patch Transdermal Q24H   mirtazapine  45 mg Oral QHS   OLANZapine zydis  5 mg Oral QHS   Continuous Infusions:  sodium chloride 500 mL (12/10/20 1536)    ceFAZolin (ANCEF) IV     dextrose 5% lactated ringers 125 mL/hr at 12/10/20 1359   potassium chloride 10 mEq (12/10/20 1537)    Assessment & Plan:   Principal Problem:   Depression Active Problems:   AKI (acute kidney injury) ([place])   Adult failure to thrive   Hypokalemia   Weight loss   Altered mental status   Tachycardia   Hypotension   Lynze Thompsonis a 65 y.o.African-American femalewith medical history significant forosteoarthritis, type 2 diabetes mellitus and hypertension, as well as COVID-19 in September 2021, who presented to the emergency room with acute onset of altered mental status and not acting herself for the last 4 days with excessive sleepiness.She has been having nausea and dry heaves without abdominal pain. Her daughter stated that she has not eaten much and has been bed bound since September 2021 since her COVID-19 infection.    Adult failure to thrive/poor PO intake/generalized deconditioning Hypokalemia/depressed mood -- patient came in with ongoing weight loss and poor PO intake per daughter. Since COVID infection in September 2021 per daughter patient has been going downhill. --She was admitted at [facility] after discharge from [facility] with adult failure to thrive and poor PO intake. Patient also went to rehab in [place]. -- She recently was seen in [facility] emergency room on 10th of February she was discharged to go home with home health and daughter thinking patient will hopefully do better with her mood and eating. -- Daughter says patient has lost more than hundred pounds since September 2021. She has not  ambulated and no interest in any acitivity. Stays in bed most of the time --she is requesting if patient can get a feeding tube -- dietitian consult-- for PEG/NG feeding -- speech  therapy-- patient showed very little interest in participating with speech therapy. Pt has Dsyphagia and at risk for aspiration. --spoke with IR RN regarding PICC line and G-tube/peg tube placement  --discussed with daughter risk and complications of feeding tube, she voiced understanding patient's daughter will sign the consent. --3/1-- patient is tachycardic despite IV fluids and IV metoprolol 5 mg x1 dose soft blood pressure and continued encephalopathy. [place] transfer her to ICU --spoke with ICU NP Darlyn Chamber to follow pt.--pt will need Central line due to poor IV access. 3/2-TRH pickup.  3/4-Plan for IR for Peg placement today pending  3/5-s/p PEG tube placement on 3/4 When ok will start feeding via PEG. Nutrition to see about this     Hypotension with sinus tachycardia, acute renal failure hypokalemia AKI improved with ivf...>up mildly again at 1.35, possibly prerenal due to hypotension 3/4-she is anemic and still hypotensive  3/5-add midodrine Continue ivf Stool occult pending  S/p 1 unit prbc transfusion on 3/4 Will transfuse another unit of prbc today Nephrology following for decrease UO- rec. Continue ivf Hold bp meds Replace with  Kcl   Atrial fib-rvr Was started on amiodarone infusion .  Cards following Now converted to NSR 3/4 heparin was discontinued on 3/3 due to drop in hemoglobin  We will continue to hold heparin  3/5-on po per tube amiodarone Will need to ck stool occult prior to resuming a/c Cards following Beta blk on hold for hypotension   Acute drop in H/H-initial hemoglobin this a.m. was 6.5 from 9.6.  Repeat is 7.4. Will hold Toradol, heparin drip Check stool occult to rule out GI bleed Transfuse if hg 7 or >. Discussed this with pt's daughter as we need consent, discussed complications of transfusion including transfusion reaction and even infection, she verbalized an understanding. 3/4 will transfuse 1 unit prbc now. 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 Neuro- recommends discussed with neurosurgery. --3/1-- per Dr Lacinda Axon "This looks rather stable compared to CT from previous. This is likely benign, possible even cystic. She would need a contrasted image to delineate. No urgent intervention needed as we know it has been there for some time. Unrelated to current issues and wont cause encephalopathy  3/2-f/u neurosurgery as outpt to discuss options going forward once she recovers from acute illness. No intervention recommended at this time.    Abnormal ultrasound abdomen with gallbladder distention similar to previous imaging studies -- clinically patient does not appear to be septic or having any infection -- discontinue IV antibiotics-- no source of infection noted so far, chest x-ray negative for pneumonia. Appears to have some opacities from previous COVID infection.sats are more than 92% on room air 3/2-surgery Dr. Celine Ahr - no surgical indication at present for gallbladder. Labs stable.     Depressed mood -- continue Cymbalta , remeron --  psych consult with Dr. Weber Cooks.   -- patient has appointment for geriatric evaluation at [facility] per daughter in April  2022  Type II diabetes with sugars controlled -- sliding scale insulin -- hold PO diabetes meds  History of tachycardia/SVT -- patient follows with cardiology at [facility] -- recently started on Toprol-XL hundred milligrams daily. With soft blood pressure decreased dose to 25 mg daily..>will dc as bp low  Generalized deconditioning -- PT evaluation noted--rec SNF  Palliative  following   DVT prophylaxis: scd Code Status:full Family Communication: daugter updated  Status is: Inpatient Patient is inpatient due to severity of her illness needing hospitalization , need IV treatments  Dispo: The patient is from: Home              Anticipated d/c is to: SNF              Patient currently is not medically stable to d/c.   Difficult to place patient No  Pt UO is low, anemic, getting transfuion           LOS: 7 days   Time spent: 35 min with >50% on coc    Nolberto Hanlon, MD [facility] Pager [phone]  If 7PM-7AM, please contact night-coverage 12/10/2020, 3:38 PM

## 2020-12-10 NOTE — Progress Notes (Signed)
[facility]  ROUNDING NOTE   Subjective:   Urine output not recorded Creatinine stable. 1.32 (1.29)  K 2.9 - IV potassium ordered.  Status post PRBC transfusion yesterday.   Objective:  Vital signs in last 24 hours:  Temp:  [94.3 F (34.6 C)-98.6 F (37 C)] 98.6 F (37 C) (03/05 0803) Pulse Rate:  [65-87] 86 (03/05 0803) Resp:  [12-18] 15 (03/05 0803) BP: (83-103)/(51-73) 99/61 (03/05 0803) SpO2:  [91 %-100 %] 100 % (03/05 0803)  Weight change:  Filed Weights   12/07/20 0458 12/08/20 0349 12/09/20 0045  Weight: 102 kg 103 kg 112 kg    Intake/Output: I/O last 3 completed shifts: In: 7416.2 [I.V.:6773.6; Blood:460; IV Piggyback:182.5] Out: 125 [Urine:125]   Intake/Output this shift:  Total I/O In: -  Out: 100 [Urine:100]  Physical Exam: General: NAD, laying in bed  Head: Normocephalic, atraumatic. Moist oral mucosal membranes  Eyes: Anicteric, PERRL  Neck: Supple, trachea midline  Lungs:  Clear to auscultation  Heart: Regular rate and rhythm  Abdomen:  Soft, nontender,   Extremities:  no peripheral edema.  Neurologic: Able to answer simple yes and no questions  Skin: No lesions  GU: Foley with urine    Basic Metabolic Panel: Recent Labs  Lab 12/06/20 0505 12/06/20 1647 12/07/20 0437 12/07/20 1642 12/08/20 0501 12/09/20 0615 12/10/20 0806  NA 143  --  139  --  141 140 138  K 3.1*  3.1*  --  3.9  --  3.3* 3.6 2.9*  CL 114*  --  113*  --  115* 114* 114*  CO2 19*  --  19*  --  19* 18* 16*  GLUCOSE 94  --  161*  --  191* 157* 147*  BUN 23  --  20  --  _0 CREATININE 1.21*  --  1.05*  --  1.35* 1.29* 1.32*  CALCIUM 8.8*  --  8.5*  --  8.5* 8.3* 7.8*  MG  --    < > 1.9 1.7 1.6* 1.8 1.7  PHOS  --    < > 2.4* 1.8* 2.0* 2.4* 2.7   < > = values in this interval not displayed.    Liver Function Tests: Recent Labs  Lab 12/06/20 1647 12/07/20 0437 12/08/20 0501 12/09/20 0615 12/10/20 0806  AST _1 33  --   ALT 34 34 35 41   --   ALKPHOS 239* 250* 195* 180*  --   BILITOT 1.0 0.5 0.6 0.7  --   PROT 4.9* 4.9* 4.3* 4.2*  --   ALBUMIN 2.0* 2.0* 1.8* 1.8* 1.5*   No results for input(s): LIPASE, AMYLASE in the last 168 hours. No results for input(s): AMMONIA in the last 168 hours.  CBC: Recent Labs  Lab 12/05/20 0432 12/06/20 0754 12/07/20 0730 12/08/20 0810 12/08/20 0930 12/09/20 0615 12/09/20 0805 12/10/20 0806  WBC 12.2* 9.4 10.8* 13.3*  --  13.0*  --   --   NEUTROABS 6.9  --   --   --   --   --   --   --   HGB 13.1 10.8* 9.6* 6.5* 7.4* 6.4* 6.3* 7.1*  HCT 37.9 30.8* 28.0* 19.1* 21.9* 18.6* 19.1* 20.4*  MCV 83.1 82.4 84.1 83.8  --  84.5  --   --   PLT 235 269 247 214  --  210  --   --     Cardiac Enzymes: No results for input(s): CKTOTAL, CKMB, CKMBINDEX, TROPONINI in  the last 168 hours.  BNP: Invalid input(s): POCBNP  CBG: Recent Labs  Lab 12/09/20 2038 12/09/20 2318 12/10/20 0355 12/10/20 0753 12/10/20 1134  GLUCAP 186* 167* 151* 127* 131*    Microbiology: Results for orders placed or performed during the hospital encounter of 12/03/20  Resp Panel by RT-PCR (Flu A&B, Covid) Nasopharyngeal Swab     Status: None   Collection Time: 12/03/20  1:35 PM   Specimen: Nasopharyngeal Swab; Nasopharyngeal(NP) swabs in vial transport medium  Result Value Ref Range Status   SARS Coronavirus 2 by RT PCR NEGATIVE NEGATIVE Final    Comment: (NOTE) SARS-CoV-2 target nucleic acids are NOT DETECTED.  The SARS-CoV-2 RNA is generally detectable in upper respiratory specimens during the acute phase of infection. The lowest concentration of SARS-CoV-2 viral copies this assay can detect is 138 copies/mL. A negative result does not preclude SARS-Cov-2 infection and should not be used as the sole basis for treatment or other patient management decisions. A negative result may occur with  improper specimen collection/handling, submission of specimen other than nasopharyngeal swab, presence of viral  mutation(s) within the areas targeted by this assay, and inadequate number of viral copies(<138 copies/mL). A negative result must be combined with clinical observations, patient history, and epidemiological information. The expected result is Negative.  Fact Sheet for Patients:  EntrepreneurPulse.com.au  Fact Sheet for Healthcare Providers:  IncredibleEmployment.be  This test is no t yet approved or cleared by the [place] FDA and  has been authorized for detection and/or diagnosis of SARS-CoV-2 by FDA under an Emergency Use Authorization (EUA). This EUA will remain  in effect (meaning this test can be used) for the duration of the COVID-19 declaration under Section 564(b)(1) of the Act, 21 U.S.C.section 360bbb-3(b)(1), unless the authorization is terminated  or revoked sooner.       Influenza A by PCR NEGATIVE NEGATIVE Final   Influenza B by PCR NEGATIVE NEGATIVE Final    Comment: (NOTE) The Xpert Xpress SARS-CoV-2/FLU/RSV plus assay is intended as an aid in the diagnosis of influenza from Nasopharyngeal swab specimens and should not be used as a sole basis for treatment. Nasal washings and aspirates are unacceptable for Xpert Xpress SARS-CoV-2/FLU/RSV testing.  Fact Sheet for Patients: EntrepreneurPulse.com.au  Fact Sheet for Healthcare Providers: IncredibleEmployment.be  This test is not yet approved or cleared by the [place] FDA and has been authorized for detection and/or diagnosis of SARS-CoV-2 by FDA under an Emergency Use Authorization (EUA). This EUA will remain in effect (meaning this test can be used) for the duration of the COVID-19 declaration under Section 564(b)(1) of the Act, 21 U.S.C. section 360bbb-3(b)(1), unless the authorization is terminated or revoked.  Performed at [facility], [address] [place], [place] [address]   CULTURE, BLOOD (ROUTINE X 2) w  Reflex to ID Panel     Status: None (Preliminary result)   Collection Time: 12/06/20 10:55 AM   Specimen: BLOOD  Result Value Ref Range Status   Specimen Description BLOOD LEFT WRIST  Final   Special Requests   Final    BOTTLES DRAWN AEROBIC ONLY Blood Culture adequate volume   Culture   Final    NO GROWTH 4 DAYS Performed at [facility], [address] [place], [place] [address]    Report Status PENDING  Incomplete  CULTURE, BLOOD (ROUTINE X 2) w Reflex to ID Panel     Status: None (Preliminary result)   Collection Time: 12/06/20 11:05 AM   Specimen: BLOOD  Result Value Ref Range Status   Specimen Description BLOOD RIGHT WRIST  Final   Special Requests   Final    BOTTLES DRAWN AEROBIC ONLY Blood Culture results may not be optimal due to an inadequate volume of blood received in culture bottles   Culture   Final    NO GROWTH 4 DAYS Performed at [facility], [address] [place], [place] [address]    Report Status PENDING  Incomplete  MRSA PCR Screening     Status: None   Collection Time: 12/06/20  1:18 PM   Specimen: Nasal Mucosa; Nasopharyngeal  Result Value Ref Range Status   MRSA by PCR NEGATIVE NEGATIVE Final    Comment:        The GeneXpert MRSA Assay (FDA approved for NASAL specimens only), is one component of a comprehensive MRSA colonization surveillance program. It is not intended to diagnose MRSA infection nor to guide or monitor treatment for MRSA infections. Performed at [facility], [address] [place], [place] [address]   Urine Culture     Status: None   Collection Time: 12/06/20  1:18 PM   Specimen: Urine, Random  Result Value Ref Range Status   Specimen Description   Final    URINE, RANDOM Performed at [facility], [address], [place], [place] [address]    Special Requests   Final    NONE Performed at [facility], [address] [place], [place] [address]    Culture   Final    NO  GROWTH Performed at [facility], [place] [address], [place],  [address]    Report Status 12/08/2020 FINAL  Final    Coagulation Studies: No results for input(s): LABPROT, INR in the last 72 hours.  Urinalysis: No results for input(s): COLORURINE, LABSPEC, PHURINE, GLUCOSEU, HGBUR, BILIRUBINUR, KETONESUR, PROTEINUR, UROBILINOGEN, NITRITE, LEUKOCYTESUR in the last 72 hours.  Invalid input(s): APPERANCEUR    Imaging: IR GASTROSTOMY TUBE MOD SED  Result Date: 12/09/2020 INDICATION: Dysphagia. Failure to thrive. Please perform percutaneous gastrostomy tube placement for enteric nutrition supplementation purposes. EXAM: PULL TROUGH GASTROSTOMY TUBE PLACEMENT COMPARISON:  CT abdomen and pelvis-12/03/2020 MEDICATIONS: Glucagon 1 mg IV CONTRAST:  20 cc of Omnipaque 300 administered into the gastric lumen. ANESTHESIA/SEDATION: Moderate (conscious) sedation was employed during this procedure. A total of Versed 0.5 mg and Fentanyl 25 mcg was administered intravenously. Moderate Sedation Time: 10 minutes. The patient's level of consciousness and vital signs were monitored continuously by radiology nursing throughout the procedure under my direct supervision. FLUOROSCOPY TIME:  3 minutes, 6 seconds (28.7 mGy) COMPLICATIONS: None immediate. PROCEDURE: Informed written consent was obtained from the patient's daughter following explanation of the procedure, risks, benefits and alternatives. A time out was performed prior to the initiation of the procedure. Ultrasound scanning was performed to demarcate the edge of the left lobe of the liver. Maximal barrier sterile technique utilized including caps, mask, sterile gowns, sterile gloves, large sterile drape, hand hygiene and Betadine prep. The left upper quadrant was sterilely prepped and draped. An oral gastric catheter was inserted into the stomach under fluoroscopy. The existing nasogastric feeding tube was removed. The left costal margin and air  opacified transverse colon were identified and avoided. Air was injected into the stomach for insufflation and visualization under fluoroscopy. Under sterile conditions a 17 gauge trocar needle was utilized to access the stomach percutaneously beneath the left subcostal margin after the overlying soft tissues were anesthetized with 1% Lidocaine with epinephrine.  Needle position was confirmed within the stomach with aspiration of air and injection of small amount of contrast. A single T tack was deployed for gastropexy. Over an Amplatz guide wire, a 9-French sheath was inserted into the stomach. A snare device was utilized to capture the oral gastric catheter. The snare device was pulled retrograde from the stomach up the esophagus and out the oropharynx. The 20-French pull-through gastrostomy was connected to the snare device and pulled antegrade through the oropharynx down the esophagus into the stomach and then through the percutaneous tract external to the patient. The gastrostomy was assembled externally. Contrast injection confirms position in the stomach. Several spot radiographic images were obtained in various obliquities for documentation. The patient tolerated procedure well without immediate post procedural complication. FINDINGS: After successful fluoroscopic guided placement, the gastrostomy tube is appropriately positioned with internal disc against the ventral aspect of the gastric lumen. IMPRESSION: Successful fluoroscopic insertion of a 20-French pull-through gastrostomy tube. The gastrostomy may be used immediately for medication administration and in 24 hrs for the initiation of feeds. Electronically Signed   By: Sandi Mariscal M.D.   On: 12/09/2020 13:28   IR Fluoro Guide CV Line Right  Result Date: 12/09/2020 INDICATION: Poor venous access.  Need PICC for IV therapies EXAM: ULTRASOUND AND FLUOROSCOPIC GUIDED PICC LINE INSERTION MEDICATIONS: 1% lidocaine 2 mL CONTRAST:  None FLUOROSCOPY TIME:  66  seconds COMPLICATIONS: None immediate. TECHNIQUE: The procedure, risks, benefits, and alternatives were explained to the patient and informed written consent was obtained. The right upper extremity was prepped with chlorhexidine in a sterile fashion, and a sterile drape was applied covering the operative field. Maximum barrier sterile technique with sterile gowns and gloves were used for the procedure. A timeout was performed prior to the initiation of the procedure. Local anesthesia was provided with 1% lidocaine. After the overlying soft tissues were anesthetized with 1% lidocaine, a micropuncture kit was utilized to access the right brachial vein. Real-time ultrasound guidance was utilized for vascular access including the acquisition of a permanent ultrasound image documenting patency of the accessed vessel. A guidewire was advanced to the level of the superior caval-atrial junction for measurement purposes and the PICC line was cut to length. A peel-away sheath was placed and a 34 cm, 5 [place], dual lumen was inserted to level of the superior caval-atrial junction. A post procedure spot fluoroscopic was obtained. The catheter easily aspirated and flushed and was secured in place. A dressing was placed. The patient tolerated the procedure well without immediate post procedural complication. FINDINGS: After catheter placement, the tip lies within the superior cavoatrial junction. The catheter aspirates and flushes normally and is ready for immediate use. IMPRESSION: Successful ultrasound and fluoroscopic guided placement of a right brachial vein approach, 34 cm, 5 French, dual lumen PICC with tip at the superior caval-atrial junction. The PICC line is ready for immediate use. Read by: Gareth Eagle, PA-C Electronically Signed   By: Sandi Mariscal M.D.   On: 12/09/2020 12:09     Medications:   . sodium chloride 500 mL (12/10/20 1136)  .  ceFAZolin (ANCEF) IV    . dextrose 5% lactated ringers 125 mL/hr at  12/10/20 0547  . potassium chloride 10 mEq (12/10/20 1249)   . sodium chloride   Intravenous Once  . amiodarone  200 mg Oral BID  . Chlorhexidine Gluconate Cloth  6 each Topical Daily  . diclofenac Sodium  2 g Topical QID  . DULoxetine  30 mg Oral Daily  .  insulin aspart  0-9 Units Subcutaneous Q4H  . lidocaine  1 patch Transdermal Q24H  . mirtazapine  45 mg Oral QHS  . OLANZapine zydis  5 mg Oral QHS   sodium chloride, acetaminophen **OR** acetaminophen, acetaminophen, fentaNYL (SUBLIMAZE) injection, magnesium hydroxide, ondansetron **OR** ondansetron (ZOFRAN) IV  Assessment/ Plan:  Ms. Chelsea Glass is a 65 y.o. black female with diabetes mellitus type II, hypertension, osteoarthritis, hyperlipidemia who was admitted to [facility] on 12/03/2020 for AKI (acute kidney injury) ([place]) [N17.9] Altered mental status, unspecified altered mental status type [R41.82]  Found to have aspiration pneumonia  1. Acute kidney injury: baseline creatinine of 0.95 with normal GFR >60 on 12/10/13/2019.  Urinalysis with proteinuria and hematuria.  History of diabetic nephropathy.  - Continue IV fluids - record urine output.   2. Hypertension: hypotensive. Holding home metoprolol dose.   3. Hypokalemia: secondary to IV fluids - replaced IV  4. Metabolic acidosis:  - consider bicarb replacement.   5. Anemia with renal failure: PRBC transfusion yesterday.    LOS: 7 Chelsea Glass 3/5/20221:41 PM

## 2020-12-10 NOTE — Progress Notes (Signed)
   12/10/20 0004  Assess: MEWS Score  Temp (!) 94.3 F (34.6 C)  BP 97/62  Pulse Rate 72  Resp 18  SpO2 100 %  O2 Device Room Air  Assess: MEWS Score  MEWS Temp 2  MEWS Systolic 1  MEWS Pulse 0  MEWS RR 0  MEWS LOC 0  MEWS Score 3  MEWS Score Color Yellow  Assess: if the MEWS score is Yellow or Red  Were vital signs taken at a resting state? Yes  Focused Assessment Change from prior assessment (see assessment flowsheet)  Early Detection of Sepsis Score *See Row Information* Medium  MEWS guidelines implemented *See Row Information* Yes  Treat  MEWS Interventions Escalated (See documentation below)  Take Vital Signs  Increase Vital Sign Frequency  Yellow: Q 2hr X 2 then Q 4hr X 2, if remains yellow, continue Q 4hrs  Escalate  MEWS: Escalate Yellow: discuss with charge nurse/RN and consider discussing with provider and RRT  Notify: Charge Nurse/RN  Name of Charge Nurse/RN Notified Maricar  Date Charge Nurse/RN Notified 12/09/20  Time Charge Nurse/RN Notified 0005  Notify: Provider  Provider Name/Title Webb Silversmith NP  Date Provider Notified 12/10/20  Time Provider Notified 0020  Notification Type Page  Notification Reason Change in status  Provider response See new orders  Date of Provider Response 12/10/20  Time of Provider Response 0021  Document  Patient Outcome Stabilized after interventions

## 2020-12-10 NOTE — Progress Notes (Addendum)
PHARMACY CONSULT NOTE  Pharmacy Consult for Electrolyte Monitoring and Replacement   Recent Labs: Potassium (mmol/L)  Date Value  12/10/2020 2.9 (L)   Magnesium (mg/dL)  Date Value  68/34/1962 1.7   Calcium (mg/dL)  Date Value  22/97/9892 7.8 (L)   Albumin (g/dL)  Date Value  11/94/1740 1.5 (L)   Phosphorus (mg/dL)  Date Value  81/44/8185 2.7   Sodium (mmol/L)  Date Value  12/10/2020 138   Assessment: 65 year old female transferred to the ICU 3/1. Patient with generalized deconditioning and failure to thrive. Dysphagia limiting safe PO intake, plan for G-tube placement. Pharmacy consult for electrolyte replacement.  Access: CVC triple lumen in LIJ Diet: NPO, pending G-tube placement 3/4 MIVF: D5LR at 125 mL/hr  Expect patient will be at risk for re-feeding when enteral access is established and feeds are started  Goal of Therapy:  K > 4 Mg > 2 All other electrolytes within normal limits  Plan:  3/5 AM labs: Na 138, K 2.9>4.0 (3/02-1607), Phos 2.7, Mg 1.7, Scr 1.32 -G-tube placed 3/4 - MD ordered KCL IV x4 (completed) -- will order IV magnesium sulfate 2 g x 1 (completed) -will recheck K at 1600. Improved to WNL --Will follow-up electrolytes with AM labs   Merrill,Kristin A 12/10/2020 10:35 AM   ADDENDUM- Wallace Going, PharmD  12/10/2020 1746

## 2020-12-10 NOTE — Progress Notes (Signed)
   12/10/20 0004  Vitals  Temp (!) 94.3 F (34.6 C)  Temp Source Rectal  BP 97/62  MAP (mmHg) 74  BP Location Left Arm  BP Method Automatic  Patient Position (if appropriate) Lying  Pulse Rate 72  Pulse Rate Source Monitor  Resp 18  MEWS COLOR  MEWS Score Color Yellow  Oxygen Therapy  SpO2 100 %  O2 Device Room Air  MEWS Score  MEWS Temp 2  MEWS Systolic 1  MEWS Pulse 0  MEWS RR 0  MEWS LOC 0  MEWS Score 3  Warming blanket placed

## 2020-12-10 NOTE — Progress Notes (Incomplete)
[place], [place] 12/10/20  Subjective:   LOS: 7 Chelsea Glass is a 65 year old African-American female with a past medical history of type 2 diabetes, hypertension, osteoarthritis, and COVID-19 in September 2021.She presents to the emergency room with altered mental status and excessive drowsiness for 4 days. She also reports some nausea with dry heaves without abdominal pain.  She has not eaten much and become bedbound since COVID-19 per daughter.  She is admitted with aspiration pneumonia.  Patient seen resting in bed  Alert and able to answer simple questions Says she hasn't eaten much, no appetite Denies nausea as a cause Denies shortness of breath  LR_0  ml/hr Amiodarone 55m/hr Foley- 100 ml UOP-125 ml  Objective:  Vital signs in last 24 hours:  Temp:  [94.3 F (34.6 C)-98.6 F (37 C)] 98.6 F (37 C) (03/05 0803) Pulse Rate:  [65-88] 86 (03/05 0803) Resp:  [10-18] 15 (03/05 0803) BP: (80-111)/(51-74) 99/61 (03/05 0803) SpO2:  [91 %-100 %] 100 % (03/05 0803)  Weight change:  Filed Weights   12/07/20 0458 12/08/20 0349 12/09/20 0045  Weight: 102 kg 103 kg 112 kg    Intake/Output:    Intake/Output Summary (Last 24 hours) at 12/10/2020 1001 Last data filed at 12/10/2020 0751 Gross per 24 hour  Intake 7416.17 ml  Output 0 ml  Net 7416.17 ml     Physical Exam: General: Ill appearing, no acute distress  HEENT Normocephalic  Pulm/lungs Basilar wheeze  CVS/Heart S1-S2 present, regular rate  Abdomen:  +G-tube  Extremities: Minimal peripheral edema  Neurologic: Alert, answers simple questions  Skin: No masses or rashes  GU +foley       Basic Metabolic Panel:  Recent Labs  Lab 12/06/20 0505 12/06/20 1647 12/07/20 0437 12/07/20 1642 12/08/20 0501 12/09/20 0615 12/10/20 0806  NA 143  --  139  --  141 140 138  K 3.1*  3.1*  --  3.9  --  3.3* 3.6 2.9*  CL 114*  --  113*  --  115* 114* 114*  CO2 19*  --  19*  --  19*  18* 16*  GLUCOSE 94  --  161*  --  191* 157* 147*  BUN 23  --  20  --  _1 CREATININE 1.21*  --  1.05*  --  1.35* 1.29* 1.32*  CALCIUM 8.8*  --  8.5*  --  8.5* 8.3* 7.8*  MG  --    < > 1.9 1.7 1.6* 1.8 1.7  PHOS  --    < > 2.4* 1.8* 2.0* 2.4* 2.7   < > = values in this interval not displayed.     CBC: Recent Labs  Lab 12/05/20 0432 12/06/20 0754 12/07/20 0730 12/08/20 0810 12/08/20 0930 12/09/20 0615 12/09/20 0805 12/10/20 0806  WBC 12.2* 9.4 10.8* 13.3*  --  13.0*  --   --   NEUTROABS 6.9  --   --   --   --   --   --   --   HGB 13.1 10.8* 9.6* 6.5* 7.4* 6.4* 6.3* 7.1*  HCT 37.9 30.8* 28.0* 19.1* 21.9* 18.6* 19.1* 20.4*  MCV 83.1 82.4 84.1 83.8  --  84.5  --   --   PLT 235 269 247 214  --  210  --   --       Lab Results  Component Value Date   HEPBSAG NON REACTIVE 06/13/2020   HEPBIGM NON REACTIVE 06/13/2020  Microbiology:  Recent Results (from the past 240 hour(s))  Resp Panel by RT-PCR (Flu A&B, Covid) Nasopharyngeal Swab     Status: None   Collection Time: 12/03/20  1:35 PM   Specimen: Nasopharyngeal Swab; Nasopharyngeal(NP) swabs in vial transport medium  Result Value Ref Range Status   SARS Coronavirus 2 by RT PCR NEGATIVE NEGATIVE Final    Comment: (NOTE) SARS-CoV-2 target nucleic acids are NOT DETECTED.  The SARS-CoV-2 RNA is generally detectable in upper respiratory specimens during the acute phase of infection. The lowest concentration of SARS-CoV-2 viral copies this assay can detect is 138 copies/mL. A negative result does not preclude SARS-Cov-2 infection and should not be used as the sole basis for treatment or other patient management decisions. A negative result may occur with  improper specimen collection/handling, submission of specimen other than nasopharyngeal swab, presence of viral mutation(s) within the areas targeted by this assay, and inadequate number of viral copies(<138 copies/mL). A negative result must be combined  with clinical observations, patient history, and epidemiological information. The expected result is Negative.  Fact Sheet for Patients:  EntrepreneurPulse.com.au  Fact Sheet for Healthcare Providers:  IncredibleEmployment.be  This test is no t yet approved or cleared by the [place] FDA and  has been authorized for detection and/or diagnosis of SARS-CoV-2 by FDA under an Emergency Use Authorization (EUA). This EUA will remain  in effect (meaning this test can be used) for the duration of the COVID-19 declaration under Section 564(b)(1) of the Act, 21 U.S.C.section 360bbb-3(b)(1), unless the authorization is terminated  or revoked sooner.       Influenza A by PCR NEGATIVE NEGATIVE Final   Influenza B by PCR NEGATIVE NEGATIVE Final    Comment: (NOTE) The Xpert Xpress SARS-CoV-2/FLU/RSV plus assay is intended as an aid in the diagnosis of influenza from Nasopharyngeal swab specimens and should not be used as a sole basis for treatment. Nasal washings and aspirates are unacceptable for Xpert Xpress SARS-CoV-2/FLU/RSV testing.  Fact Sheet for Patients: EntrepreneurPulse.com.au  Fact Sheet for Healthcare Providers: IncredibleEmployment.be  This test is not yet approved or cleared by the [place] FDA and has been authorized for detection and/or diagnosis of SARS-CoV-2 by FDA under an Emergency Use Authorization (EUA). This EUA will remain in effect (meaning this test can be used) for the duration of the COVID-19 declaration under Section 564(b)(1) of the Act, 21 U.S.C. section 360bbb-3(b)(1), unless the authorization is terminated or revoked.  Performed at [facility], [address] [place], [place] [address]   CULTURE, BLOOD (ROUTINE X 2) w Reflex to ID Panel     Status: None (Preliminary result)   Collection Time: 12/06/20 10:55 AM   Specimen: BLOOD  Result Value Ref Range Status    Specimen Description BLOOD LEFT WRIST  Final   Special Requests   Final    BOTTLES DRAWN AEROBIC ONLY Blood Culture adequate volume   Culture   Final    NO GROWTH 4 DAYS Performed at [facility], [address], [place], [place] [address]    Report Status PENDING  Incomplete  CULTURE, BLOOD (ROUTINE X 2) w Reflex to ID Panel     Status: None (Preliminary result)   Collection Time: 12/06/20 11:05 AM   Specimen: BLOOD  Result Value Ref Range Status   Specimen Description BLOOD RIGHT WRIST  Final   Special Requests   Final    BOTTLES DRAWN AEROBIC ONLY Blood Culture results may not be optimal due to an  inadequate volume of blood received in culture bottles   Culture   Final    NO GROWTH 4 DAYS Performed at [facility], [address] [place], [place] [address]    Report Status PENDING  Incomplete  MRSA PCR Screening     Status: None   Collection Time: 12/06/20  1:18 PM   Specimen: Nasal Mucosa; Nasopharyngeal  Result Value Ref Range Status   MRSA by PCR NEGATIVE NEGATIVE Final    Comment:        The GeneXpert MRSA Assay (FDA approved for NASAL specimens only), is one component of a comprehensive MRSA colonization surveillance program. It is not intended to diagnose MRSA infection nor to guide or monitor treatment for MRSA infections. Performed at [facility], [address], [place], [place] [address]   Urine Culture     Status: None   Collection Time: 12/06/20  1:18 PM   Specimen: Urine, Random  Result Value Ref Range Status   Specimen Description   Final    URINE, RANDOM Performed at [facility], [address] [place], [place] [address]    Special Requests   Final    NONE Performed at [facility], [address], [place], [place] [address]    Culture   Final    NO GROWTH Performed at [facility], [place] [address], [place], [place] [address]    Report Status 12/08/2020 FINAL  Final    Coagulation  Studies: No results for input(s): LABPROT, INR in the last 72 hours.  Urinalysis: No results for input(s): COLORURINE, LABSPEC, PHURINE, GLUCOSEU, HGBUR, BILIRUBINUR, KETONESUR, PROTEINUR, UROBILINOGEN, NITRITE, LEUKOCYTESUR in the last 72 hours.  Invalid input(s): APPERANCEUR    Imaging: IR GASTROSTOMY TUBE MOD SED  Result Date: 12/09/2020 INDICATION: Dysphagia. Failure to thrive. Please perform percutaneous gastrostomy tube placement for enteric nutrition supplementation purposes. EXAM: PULL TROUGH GASTROSTOMY TUBE PLACEMENT COMPARISON:  CT abdomen and pelvis-12/03/2020 MEDICATIONS: Glucagon 1 mg IV CONTRAST:  20 cc of Omnipaque 300 administered into the gastric lumen. ANESTHESIA/SEDATION: Moderate (conscious) sedation was employed during this procedure. A total of Versed 0.5 mg and Fentanyl 25 mcg was administered intravenously. Moderate Sedation Time: 10 minutes. The patient's level of consciousness and vital signs were monitored continuously by radiology nursing throughout the procedure under my direct supervision. FLUOROSCOPY TIME:  3 minutes, 6 seconds (12.8 mGy) COMPLICATIONS: None immediate. PROCEDURE: Informed written consent was obtained from the patient's daughter following explanation of the procedure, risks, benefits and alternatives. A time out was performed prior to the initiation of the procedure. Ultrasound scanning was performed to demarcate the edge of the left lobe of the liver. Maximal barrier sterile technique utilized including caps, mask, sterile gowns, sterile gloves, large sterile drape, hand hygiene and Betadine prep. The left upper quadrant was sterilely prepped and draped. An oral gastric catheter was inserted into the stomach under fluoroscopy. The existing nasogastric feeding tube was removed. The left costal margin and air opacified transverse colon were identified and avoided. Air was injected into the stomach for insufflation and visualization under fluoroscopy. Under  sterile conditions a 17 gauge trocar needle was utilized to access the stomach percutaneously beneath the left subcostal margin after the overlying soft tissues were anesthetized with 1% Lidocaine with epinephrine. Needle position was confirmed within the stomach with aspiration of air and injection of small amount of contrast. A single T tack was deployed for gastropexy. Over an Amplatz guide wire, a 9-French sheath was inserted into the  stomach. A snare device was utilized to capture the oral gastric catheter. The snare device was pulled retrograde from the stomach up the esophagus and out the oropharynx. The 20-French pull-through gastrostomy was connected to the snare device and pulled antegrade through the oropharynx down the esophagus into the stomach and then through the percutaneous tract external to the patient. The gastrostomy was assembled externally. Contrast injection confirms position in the stomach. Several spot radiographic images were obtained in various obliquities for documentation. The patient tolerated procedure well without immediate post procedural complication. FINDINGS: After successful fluoroscopic guided placement, the gastrostomy tube is appropriately positioned with internal disc against the ventral aspect of the gastric lumen. IMPRESSION: Successful fluoroscopic insertion of a 20-French pull-through gastrostomy tube. The gastrostomy may be used immediately for medication administration and in 24 hrs for the initiation of feeds. Electronically Signed   By: Sandi Mariscal M.D.   On: 12/09/2020 13:28   US RENAL  Result Date: 12/08/2020 CLINICAL DATA:  Acute renal failure EXAM: RENAL / URINARY TRACT ULTRASOUND COMPLETE COMPARISON:  CT abdomen 12/03/2020 FINDINGS: Right Kidney: Renal measurements: 8.0 x 4.6 x 5.3 cm = volume: 103 mL. Negative for obstruction. Renal cortical echogenicity normal. 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  Fatty lesion noted in this area on CT. Left Kidney: Renal measurements: 8.7 x 4.7 x 5.1 cm = volume: 108 mL. Echogenicity within normal limits. No mass or hydronephrosis visualized. Bladder: Empty bladder with Foley catheter Other: Fatty liver. IMPRESSION: Small kidneys bilaterally.  No renal obstruction. 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 Electronically Signed   By: Franchot Gallo M.D.   On: 12/08/2020 13:39   IR Fluoro Guide CV Line Right  Result Date: 12/09/2020 INDICATION: Poor venous access.  Need PICC for IV therapies EXAM: ULTRASOUND AND FLUOROSCOPIC GUIDED PICC LINE INSERTION MEDICATIONS: 1% lidocaine 2 mL CONTRAST:  None FLUOROSCOPY TIME:  66 seconds COMPLICATIONS: None immediate. TECHNIQUE: The procedure, risks, benefits, and alternatives were explained to the patient and informed written consent was obtained. The right upper extremity was prepped with chlorhexidine in a sterile fashion, and a sterile drape was applied covering the operative field. Maximum barrier sterile technique with sterile gowns and gloves were used for the procedure. A timeout was performed prior to the initiation of the procedure. Local anesthesia was provided with 1% lidocaine. After the overlying soft tissues were anesthetized with 1% lidocaine, a micropuncture kit was utilized to access the right brachial vein. Real-time ultrasound guidance was utilized for vascular access including the acquisition of a permanent ultrasound image documenting patency of the accessed vessel. A guidewire was advanced to the level of the superior caval-atrial junction for measurement purposes and the PICC line was cut to length. A peel-away sheath was placed and a 34 cm, 5 [place], dual lumen was inserted to level of the superior caval-atrial junction. A post procedure spot fluoroscopic was obtained. The catheter easily aspirated and flushed and was secured in place. A dressing was placed. The patient tolerated the procedure well without  immediate post procedural complication. FINDINGS: After catheter placement, the tip lies within the superior cavoatrial junction. The catheter aspirates and flushes normally and is ready for immediate use. IMPRESSION: Successful ultrasound and fluoroscopic guided placement of a right brachial vein approach, 34 cm, 5 French, dual lumen PICC with tip at the superior caval-atrial junction. The PICC line is ready for immediate use. Read by: Gareth Eagle, PA-C Electronically Signed  By: Sandi Mariscal M.D.   On: 12/09/2020 12:09     Medications:   .  ceFAZolin (ANCEF) IV    . dextrose 5% lactated ringers 125 mL/hr at 12/10/20 0547  . potassium chloride     . sodium chloride   Intravenous Once  . amiodarone  200 mg Oral BID  . Chlorhexidine Gluconate Cloth  6 each Topical Daily  . diclofenac Sodium  2 g Topical QID  . DULoxetine  30 mg Oral Daily  . insulin aspart  0-9 Units Subcutaneous Q4H  . lidocaine  1 patch Transdermal Q24H  . mirtazapine  45 mg Oral QHS  . OLANZapine zydis  5 mg Oral QHS   acetaminophen **OR** acetaminophen, acetaminophen, fentaNYL (SUBLIMAZE) injection, magnesium hydroxide, ondansetron **OR** ondansetron (ZOFRAN) IV  Assessment/ Plan:  65 y.o. female with  was admitted on 12/03/2020 for  Principal Problem:   Depression Active Problems:   AKI (acute kidney injury) ([place])   Adult failure to thrive   Hypokalemia   Weight loss   Altered mental status   Tachycardia   Hypotension  AKI (acute kidney injury) ([place]) [N17.9] Altered mental status, unspecified altered mental status type [R41.82]  #. Acute Kidney Injury  -likely due to dehydration  -Baseline Creatinine 0.95/EGFR>60 on 09/21/2020 -Currently 1.35 - Renal US- small kidneys bilaterally, no obstruction, mild lipoma upper right kidney -UPEP/SPEP-awaiting collection -messaged nursing about needed specimens -Continue IVF -No dialysis at this time -will continue to monitor labs for now  #. Anemia   Lab  Results  Component Value Date   HGB 7.1 (L) 12/10/2020   Poor nutrition prior to admission and currently PEG requested per family and placed today Will monitor Primary team may treat  #. Diabetes type 2 Hgb A1c MFr Bld (%)  Date Value  12/03/2020 4.6 (L)  Glucose controlled  # Afib with RVR Amiodarone started yesterday Converted to NSR Cardiology following closely   Appreciate the consult   LOS: 7 [facility] 3/5/202210:01 AM  [place], [place]

## 2020-12-11 DIAGNOSIS — R400 Somnolence: Secondary | ICD-10-CM | POA: Diagnosis not present

## 2020-12-11 DIAGNOSIS — R627 Adult failure to thrive: Secondary | ICD-10-CM | POA: Diagnosis not present

## 2020-12-11 DIAGNOSIS — F32A Depression, unspecified: Secondary | ICD-10-CM | POA: Diagnosis not present

## 2020-12-11 DIAGNOSIS — N179 Acute kidney failure, unspecified: Secondary | ICD-10-CM | POA: Diagnosis not present

## 2020-12-11 LAB — GLUCOSE, CAPILLARY
Glucose-Capillary: 132 mg/dL — ABNORMAL HIGH (ref 70–99)
Glucose-Capillary: 134 mg/dL — ABNORMAL HIGH (ref 70–99)
Glucose-Capillary: 149 mg/dL — ABNORMAL HIGH (ref 70–99)
Glucose-Capillary: 156 mg/dL — ABNORMAL HIGH (ref 70–99)
Glucose-Capillary: 164 mg/dL — ABNORMAL HIGH (ref 70–99)
Glucose-Capillary: 170 mg/dL — ABNORMAL HIGH (ref 70–99)

## 2020-12-11 LAB — CULTURE, BLOOD (ROUTINE X 2)
Culture: NO GROWTH
Culture: NO GROWTH
Special Requests: ADEQUATE

## 2020-12-11 LAB — BASIC METABOLIC PANEL
Anion gap: 7 (ref 5–15)
BUN: 18 mg/dL (ref 8–23)
CO2: 18 mmol/L — ABNORMAL LOW (ref 22–32)
Calcium: 8.4 mg/dL — ABNORMAL LOW (ref 8.9–10.3)
Chloride: 111 mmol/L (ref 98–111)
Creatinine, Ser: 1.52 mg/dL — ABNORMAL HIGH (ref 0.44–1.00)
GFR, Estimated: 38 mL/min — ABNORMAL LOW (ref >60)
Glucose, Bld: 183 mg/dL — ABNORMAL HIGH (ref 70–99)
Potassium: 3.9 mmol/L (ref 3.5–5.1)
Sodium: 136 mmol/L (ref 135–145)

## 2020-12-11 LAB — OCCULT BLOOD X 1 CARD TO LAB, STOOL: Fecal Occult Bld: POSITIVE — AB

## 2020-12-11 LAB — CBC
HCT: 26.8 % — ABNORMAL LOW (ref 36.0–46.0)
Hemoglobin: 9.3 g/dL — ABNORMAL LOW (ref 12.0–15.0)
MCH: 28.9 pg (ref 26.0–34.0)
MCHC: 34.7 g/dL (ref 30.0–36.0)
MCV: 83.2 fL (ref 80.0–100.0)
Platelets: 130 10*3/uL — ABNORMAL LOW (ref 150–400)
RBC: 3.22 MIL/uL — ABNORMAL LOW (ref 3.87–5.11)
RDW: 16.3 % — ABNORMAL HIGH (ref 11.5–15.5)
WBC: 11.2 10*3/uL — ABNORMAL HIGH (ref 4.0–10.5)
nRBC: 0.2 % (ref 0.0–0.2)

## 2020-12-11 LAB — TYPE AND SCREEN
ABO/RH(D): O POS
Antibody Screen: NEGATIVE
Unit division: 0
Unit division: 0

## 2020-12-11 LAB — BPAM RBC
Blood Product Expiration Date: 202204062359
Blood Product Expiration Date: 202204062359
ISSUE DATE / TIME: 202203050224
ISSUE DATE / TIME: 202203052146
Unit Type and Rh: 5100
Unit Type and Rh: 5100

## 2020-12-11 LAB — ECHOCARDIOGRAM COMPLETE
AR max vel: 2.75 cm2
AV Area VTI: 3.57 cm2
AV Area mean vel: 2.84 cm2
AV Mean grad: 3 mmHg
AV Peak grad: 5.6 mmHg
Ao pk vel: 1.18 m/s
Area-P 1/2: 5.42 cm2
Calc EF: 47.2 %
Height: 65 in
MV VTI: 2.83 cm2
S' Lateral: 2 cm
Single Plane A2C EF: 43.2 %
Single Plane A4C EF: 51.7 %
Weight: 3633.18 oz

## 2020-12-11 LAB — PHOSPHORUS: Phosphorus: 2.8 mg/dL (ref 2.5–4.6)

## 2020-12-11 LAB — MAGNESIUM: Magnesium: 2 mg/dL (ref 1.7–2.4)

## 2020-12-11 LAB — ALBUMIN: Albumin: 1.6 g/dL — ABNORMAL LOW (ref 3.5–5.0)

## 2020-12-11 MED ORDER — PANTOPRAZOLE SODIUM 40 MG PO PACK
40.0000 mg | PACK | Freq: Every day | ORAL | Status: DC
  Administered 2020-12-11: 40 mg
  Filled 2020-12-11 (×2): qty 20

## 2020-12-11 MED ORDER — MIDODRINE HCL 5 MG PO TABS
5.0000 mg | ORAL_TABLET | Freq: Three times a day (TID) | ORAL | Status: DC
  Administered 2020-12-11 – 2020-12-12 (×5): 5 mg
  Filled 2020-12-11 (×5): qty 1

## 2020-12-11 MED ORDER — ALBUMIN HUMAN 25 % IV SOLN
25.0000 g | Freq: Three times a day (TID) | INTRAVENOUS | Status: DC
  Administered 2020-12-11 – 2020-12-14 (×9): 25 g via INTRAVENOUS
  Filled 2020-12-11 (×7): qty 100

## 2020-12-11 MED ORDER — PANTOPRAZOLE SODIUM 40 MG IV SOLR
40.0000 mg | Freq: Two times a day (BID) | INTRAVENOUS | Status: DC
  Administered 2020-12-11 – 2020-12-12 (×2): 40 mg via INTRAVENOUS
  Filled 2020-12-11 (×2): qty 40

## 2020-12-11 MED ORDER — PANTOPRAZOLE SODIUM 40 MG PO TBEC: 40.0000 mg | DELAYED_RELEASE_TABLET | Freq: Every day | ORAL | Status: DC

## 2020-12-11 NOTE — Progress Notes (Signed)
[facility]  ROUNDING NOTE   Subjective:   Daughter at bedside. Patient answering questions.  UOP 286m Getting D5LR infusion   Objective:  Vital signs in last 24 hours:  Temp:  [97.2 F (36.2 C)-99 F (37.2 C)] 99 F (37.2 C) (03/06 1135) Pulse Rate:  [79-94] 92 (03/06 1135) Resp:  [17-18] 18 (03/06 1135) BP: (76-103)/(54-65) 83/57 (03/06 1135) SpO2:  [97 %-100 %] 99 % (03/06 1135) Weight:  [120.2 kg] 120.2 kg (03/06 0119)  Weight change:  Filed Weights   12/08/20 0349 12/09/20 0045 12/11/20 0119  Weight: 103 kg 112 kg 120.2 kg    Intake/Output: I/O last 3 completed shifts: In: 2027.9 [I.V.:1137.9; Blood:890] Out: 200 [Urine:200]   Intake/Output this shift:  No intake/output data recorded.  Physical Exam: General: NAD, laying in bed  Head: Normocephalic, atraumatic. Moist oral mucosal membranes  Eyes: Anicteric, PERRL  Neck: Supple, trachea midline  Lungs:  Clear to auscultation  Heart: Regular rate and rhythm  Abdomen:  +G-tube  Extremities:  ++ peripheral edema.  Neurologic: Able to answer simple yes and no questions  Skin: No lesions  GU: Foley with urine    Basic Metabolic Panel: Recent Labs  Lab 12/07/20 0437 12/07/20 1642 12/08/20 0501 12/09/20 0615 12/10/20 0806 12/10/20 1608 12/11/20 0425  NA 139  --  141 140 138  --  136  K 3.9  --  3.3* 3.6 2.9* 4.0 3.9  CL 113*  --  115* 114* 114*  --  111  CO2 19*  --  19* 18* 16*  --  18*  GLUCOSE 161*  --  191* 157* 147*  --  183*  BUN 20  --  19 17 16   --  18  CREATININE 1.05*  --  1.35* 1.29* 1.32*  --  1.52*  CALCIUM 8.5*  --  8.5* 8.3* 7.8*  --  8.4*  MG 1.9 1.7 1.6* 1.8 1.7  --  2.0  PHOS 2.4* 1.8* 2.0* 2.4* 2.7  --  2.8    Liver Function Tests: Recent Labs  Lab 12/06/20 1647 12/07/20 0437 12/08/20 0501 12/09/20 0615 12/10/20 0806 12/11/20 0425  AST 23 25 27  33  --   --   ALT 34 34 35 41  --   --   ALKPHOS 239* 250* 195* 180*  --   --   BILITOT 1.0 0.5 0.6 0.7  --    --   PROT 4.9* 4.9* 4.3* 4.2*  --   --   ALBUMIN 2.0* 2.0* 1.8* 1.8* 1.5* 1.6*   No results for input(s): LIPASE, AMYLASE in the last 168 hours. No results for input(s): AMMONIA in the last 168 hours.  CBC: Recent Labs  Lab 12/05/20 0432 12/06/20 0754 12/07/20 0730 12/08/20 0810 12/08/20 0930 12/09/20 0615 12/09/20 0805 12/10/20 0806 12/11/20 0425  WBC 12.2* 9.4 10.8* 13.3*  --  13.0*  --   --  11.2*  NEUTROABS 6.9  --   --   --   --   --   --   --   --   HGB 13.1 10.8* 9.6* 6.5* 7.4* 6.4* 6.3* 7.1* 9.3*  HCT 37.9 30.8* 28.0* 19.1* 21.9* 18.6* 19.1* 20.4* 26.8*  MCV 83.1 82.4 84.1 83.8  --  84.5  --   --  83.2  PLT 235 269 247 214  --  210  --   --  130*    Cardiac Enzymes: No results for input(s): CKTOTAL, CKMB, CKMBINDEX, TROPONINI in the last  168 hours.  BNP: Invalid input(s): POCBNP  CBG: Recent Labs  Lab 12/10/20 2024 12/11/20 0020 12/11/20 0418 12/11/20 0812 12/11/20 1136  GLUCAP 154* 170* 164* 156* 149*    Microbiology: Results for orders placed or performed during the hospital encounter of 12/03/20  Resp Panel by RT-PCR (Flu A&B, Covid) Nasopharyngeal Swab     Status: None   Collection Time: 12/03/20  1:35 PM   Specimen: Nasopharyngeal Swab; Nasopharyngeal(NP) swabs in vial transport medium  Result Value Ref Range Status   SARS Coronavirus 2 by RT PCR NEGATIVE NEGATIVE Final    Comment: (NOTE) SARS-CoV-2 target nucleic acids are NOT DETECTED.  The SARS-CoV-2 RNA is generally detectable in upper respiratory specimens during the acute phase of infection. The lowest concentration of SARS-CoV-2 viral copies this assay can detect is 138 copies/mL. A negative result does not preclude SARS-Cov-2 infection and should not be used as the sole basis for treatment or other patient management decisions. A negative result may occur with  improper specimen collection/handling, submission of specimen other than nasopharyngeal swab, presence of viral mutation(s)  within the areas targeted by this assay, and inadequate number of viral copies(<138 copies/mL). A negative result must be combined with clinical observations, patient history, and epidemiological information. The expected result is Negative.  Fact Sheet for Patients:  EntrepreneurPulse.com.au  Fact Sheet for Healthcare Providers:  IncredibleEmployment.be  This test is no t yet approved or cleared by the [place] FDA and  has been authorized for detection and/or diagnosis of SARS-CoV-2 by FDA under an Emergency Use Authorization (EUA). This EUA will remain  in effect (meaning this test can be used) for the duration of the COVID-19 declaration under Section 564(b)(1) of the Act, 21 U.S.C.section 360bbb-3(b)(1), unless the authorization is terminated  or revoked sooner.       Influenza A by PCR NEGATIVE NEGATIVE Final   Influenza B by PCR NEGATIVE NEGATIVE Final    Comment: (NOTE) The Xpert Xpress SARS-CoV-2/FLU/RSV plus assay is intended as an aid in the diagnosis of influenza from Nasopharyngeal swab specimens and should not be used as a sole basis for treatment. Nasal washings and aspirates are unacceptable for Xpert Xpress SARS-CoV-2/FLU/RSV testing.  Fact Sheet for Patients: EntrepreneurPulse.com.au  Fact Sheet for Healthcare Providers: IncredibleEmployment.be  This test is not yet approved or cleared by the [place] FDA and has been authorized for detection and/or diagnosis of SARS-CoV-2 by FDA under an Emergency Use Authorization (EUA). This EUA will remain in effect (meaning this test can be used) for the duration of the COVID-19 declaration under Section 564(b)(1) of the Act, 21 U.S.C. section 360bbb-3(b)(1), unless the authorization is terminated or revoked.  Performed at [facility], [address] [place], [place] [address]   CULTURE, BLOOD (ROUTINE X 2) w Reflex to ID  Panel     Status: None (Preliminary result)   Collection Time: 12/06/20 10:55 AM   Specimen: BLOOD  Result Value Ref Range Status   Specimen Description BLOOD LEFT WRIST  Final   Special Requests   Final    BOTTLES DRAWN AEROBIC ONLY Blood Culture adequate volume   Culture   Final    NO GROWTH 4 DAYS Performed at [facility], [address] [place], [place] [address]    Report Status PENDING  Incomplete  CULTURE, BLOOD (ROUTINE X 2) w Reflex to ID Panel     Status: None (Preliminary result)   Collection Time: 12/06/20 11:05 AM   Specimen: BLOOD  Result  Value Ref Range Status   Specimen Description BLOOD RIGHT WRIST  Final   Special Requests   Final    BOTTLES DRAWN AEROBIC ONLY Blood Culture results may not be optimal due to an inadequate volume of blood received in culture bottles   Culture   Final    NO GROWTH 4 DAYS Performed at [facility], [address] [place], [place] [address]    Report Status PENDING  Incomplete  MRSA PCR Screening     Status: None   Collection Time: 12/06/20  1:18 PM   Specimen: Nasal Mucosa; Nasopharyngeal  Result Value Ref Range Status   MRSA by PCR NEGATIVE NEGATIVE Final    Comment:        The GeneXpert MRSA Assay (FDA approved for NASAL specimens only), is one component of a comprehensive MRSA colonization surveillance program. It is not intended to diagnose MRSA infection nor to guide or monitor treatment for MRSA infections. Performed at [facility], [address] [place], [place] [address]   Urine Culture     Status: None   Collection Time: 12/06/20  1:18 PM   Specimen: Urine, Random  Result Value Ref Range Status   Specimen Description   Final    URINE, RANDOM Performed at [facility], [address] [place], [place] [address]    Special Requests   Final    NONE Performed at [facility], [address] [place], [place] [address]    Culture   Final    NO  GROWTH Performed at [facility], [place] [address] [place], [place] [address]    Report Status 12/08/2020 FINAL  Final    Coagulation Studies: No results for input(s): LABPROT, INR in the last 72 hours.  Urinalysis: No results for input(s): COLORURINE, LABSPEC, PHURINE, GLUCOSEU, HGBUR, BILIRUBINUR, KETONESUR, PROTEINUR, UROBILINOGEN, NITRITE, LEUKOCYTESUR in the last 72 hours.  Invalid input(s): APPERANCEUR    Imaging: IR GASTROSTOMY TUBE MOD SED  Result Date: 12/09/2020 INDICATION: Dysphagia. Failure to thrive. Please perform percutaneous gastrostomy tube placement for enteric nutrition supplementation purposes. EXAM: PULL TROUGH GASTROSTOMY TUBE PLACEMENT COMPARISON:  CT abdomen and pelvis-12/03/2020 MEDICATIONS: Glucagon 1 mg IV CONTRAST:  20 cc of Omnipaque 300 administered into the gastric lumen. ANESTHESIA/SEDATION: Moderate (conscious) sedation was employed during this procedure. A total of Versed 0.5 mg and Fentanyl 25 mcg was administered intravenously. Moderate Sedation Time: 10 minutes. The patient's level of consciousness and vital signs were monitored continuously by radiology nursing throughout the procedure under my direct supervision. FLUOROSCOPY TIME:  3 minutes, 6 seconds (13.0 mGy) COMPLICATIONS: None immediate. PROCEDURE: Informed written consent was obtained from the patient's daughter following explanation of the procedure, risks, benefits and alternatives. A time out was performed prior to the initiation of the procedure. Ultrasound scanning was performed to demarcate the edge of the left lobe of the liver. Maximal barrier sterile technique utilized including caps, mask, sterile gowns, sterile gloves, large sterile drape, hand hygiene and Betadine prep. The left upper quadrant was sterilely prepped and draped. An oral gastric catheter was inserted into the stomach under fluoroscopy. The existing nasogastric feeding tube was removed. The left costal margin and air  opacified transverse colon were identified and avoided. Air was injected into the stomach for insufflation and visualization under fluoroscopy. Under sterile conditions a 17 gauge trocar needle was utilized to access the stomach percutaneously beneath the left subcostal margin after the overlying soft tissues were anesthetized with 1% Lidocaine with epinephrine. Needle  position was confirmed within the stomach with aspiration of air and injection of small amount of contrast. A single T tack was deployed for gastropexy. Over an Amplatz guide wire, a 9-French sheath was inserted into the stomach. A snare device was utilized to capture the oral gastric catheter. The snare device was pulled retrograde from the stomach up the esophagus and out the oropharynx. The 20-French pull-through gastrostomy was connected to the snare device and pulled antegrade through the oropharynx down the esophagus into the stomach and then through the percutaneous tract external to the patient. The gastrostomy was assembled externally. Contrast injection confirms position in the stomach. Several spot radiographic images were obtained in various obliquities for documentation. The patient tolerated procedure well without immediate post procedural complication. FINDINGS: After successful fluoroscopic guided placement, the gastrostomy tube is appropriately positioned with internal disc against the ventral aspect of the gastric lumen. IMPRESSION: Successful fluoroscopic insertion of a 20-French pull-through gastrostomy tube. The gastrostomy may be used immediately for medication administration and in 24 hrs for the initiation of feeds. Electronically Signed   By: Sandi Mariscal M.D.   On: 12/09/2020 13:28   IR Fluoro Guide CV Line Right  Result Date: 12/09/2020 INDICATION: Poor venous access.  Need PICC for IV therapies EXAM: ULTRASOUND AND FLUOROSCOPIC GUIDED PICC LINE INSERTION MEDICATIONS: 1% lidocaine 2 mL CONTRAST:  None FLUOROSCOPY TIME:  66  seconds COMPLICATIONS: None immediate. TECHNIQUE: The procedure, risks, benefits, and alternatives were explained to the patient and informed written consent was obtained. The right upper extremity was prepped with chlorhexidine in a sterile fashion, and a sterile drape was applied covering the operative field. Maximum barrier sterile technique with sterile gowns and gloves were used for the procedure. A timeout was performed prior to the initiation of the procedure. Local anesthesia was provided with 1% lidocaine. After the overlying soft tissues were anesthetized with 1% lidocaine, a micropuncture kit was utilized to access the right brachial vein. Real-time ultrasound guidance was utilized for vascular access including the acquisition of a permanent ultrasound image documenting patency of the accessed vessel. A guidewire was advanced to the level of the superior caval-atrial junction for measurement purposes and the PICC line was cut to length. A peel-away sheath was placed and a 34 cm, 5 [place], dual lumen was inserted to level of the superior caval-atrial junction. A post procedure spot fluoroscopic was obtained. The catheter easily aspirated and flushed and was secured in place. A dressing was placed. The patient tolerated the procedure well without immediate post procedural complication. FINDINGS: After catheter placement, the tip lies within the superior cavoatrial junction. The catheter aspirates and flushes normally and is ready for immediate use. IMPRESSION: Successful ultrasound and fluoroscopic guided placement of a right brachial vein approach, 34 cm, 5 French, dual lumen PICC with tip at the superior caval-atrial junction. The PICC line is ready for immediate use. Read by: Gareth Eagle, PA-C Electronically Signed   By: Sandi Mariscal M.D.   On: 12/09/2020 12:09     Medications:   . sodium chloride 500 mL (12/10/20 1536)  . albumin human    .  ceFAZolin (ANCEF) IV     . sodium chloride    Intravenous Once  . sodium chloride   Intravenous Once  . amiodarone  200 mg Oral BID  . Chlorhexidine Gluconate Cloth  6 each Topical Daily  . diclofenac Sodium  2 g Topical QID  . DULoxetine  30 mg Oral Daily  . feeding supplement (OSMOLITE 1.2  CAL)  237 mL Per Tube 6 X Daily  . feeding supplement (PROSource TF)  45 mL Per Tube BID  . insulin aspart  0-9 Units Subcutaneous Q4H  . lidocaine  1 patch Transdermal Q24H  . midodrine  5 mg Per Tube TID WC  . mirtazapine  45 mg Oral QHS  . OLANZapine zydis  5 mg Oral QHS   sodium chloride, acetaminophen **OR** acetaminophen, acetaminophen, fentaNYL (SUBLIMAZE) injection, magnesium hydroxide, ondansetron **OR** ondansetron (ZOFRAN) IV  Assessment/ Plan:  Ms. Chelsea Glass is a 65 y.o. black female with diabetes mellitus type II, hypertension, osteoarthritis, hyperlipidemia who was admitted to [facility] on 12/03/2020 for AKI (acute kidney injury) ([facility]) [N17.9] Altered mental status, unspecified altered mental status type [R41.82]  Found to have aspiration pneumonia  1. Acute kidney injury: baseline creatinine of 0.95 with normal GFR >60 on 12/10/13/2019.  Urinalysis with proteinuria and hematuria.  History of diabetic nephropathy.  - Discontinue IV fluids - IV albumin - Low threshold for IV diuretics.   2. Hypotension: Holding home metoprolol dose.  - midodrine  3. Hypokalemia: improved with IV fluids.   4. Metabolic acidosis:  - may require diuretics for contration  5. Anemia with renal failure: PRBC transfusion 3/4.    LOS: 8 Nichalas Coin 3/6/202212:07 PM

## 2020-12-11 NOTE — Progress Notes (Signed)
PHARMACY CONSULT NOTE  Pharmacy Consult for Electrolyte Monitoring and Replacement   Recent Labs: Potassium (mmol/L)  Date Value  12/11/2020 3.9   Magnesium (mg/dL)  Date Value  54/65/6812 2.0   Calcium (mg/dL)  Date Value  75/17/0017 8.4 (L)   Albumin (g/dL)  Date Value  49/44/9675 1.5 (L)   Phosphorus (mg/dL)  Date Value  91/63/8466 2.8   Sodium (mmol/L)  Date Value  12/11/2020 136   Assessment: 65 year old female transferred to the ICU 3/1. Patient with generalized deconditioning and failure to thrive. Dysphagia limiting safe PO intake, plan for G-tube placement. Pharmacy consult for electrolyte replacement.  Access: CVC triple lumen in LIJ Diet: NPO, pending G-tube placement 3/4 MIVF: D5LR at 125 mL/hr  Expect patient will be at risk for re-feeding when enteral access is established and feeds are started  Goal of Therapy:  K > 4 Mg > 2 All other electrolytes within normal limits  Plan:  3/6 labs: Na 136, K 3.9, Phos 2.8, Mg 2.0, Scr 1.52 -G-tube placed 3/4, tube feeds started 3/5 - no electrolyte replacement at this time --Will follow-up electrolytes with AM labs   Bari Mantis PharmD Clinical Pharmacist 12/11/2020

## 2020-12-11 NOTE — Progress Notes (Signed)
SUBJECTIVE: Patient appears comfortable   Vitals:   12/11/20 0119 12/11/20 0154 12/11/20 0412 12/11/20 0813  BP:  103/65 101/63 (!) 90/59  Pulse:  94 91 88  Resp:   18 18  Temp:  97.6 F (36.4 C) 98.6 F (37 C) 98.2 F (36.8 C)  TempSrc:  Oral Oral Oral  SpO2:  99% 100% 100%  Weight: 120.2 kg     Height:        Intake/Output Summary (Last 24 hours) at 12/11/2020 0916 Last data filed at 12/11/2020 0500 Gross per 24 hour  Intake 430 ml  Output 200 ml  Net 230 ml    LABS: Basic Metabolic Panel: Recent Labs    12/10/20 0806 12/10/20 1608 12/11/20 0425  NA 138  --  136  K 2.9* 4.0 3.9  CL 114*  --  111  CO2 16*  --  18*  GLUCOSE 147*  --  183*  BUN 16  --  18  CREATININE 1.32*  --  1.52*  CALCIUM 7.8*  --  8.4*  MG 1.7  --  2.0  PHOS 2.7  --  2.8   Liver Function Tests: Recent Labs    12/09/20 0615 12/10/20 0806  AST 33  --   ALT 41  --   ALKPHOS 180*  --   BILITOT 0.7  --   PROT 4.2*  --   ALBUMIN 1.8* 1.5*   No results for input(s): LIPASE, AMYLASE in the last 72 hours. CBC: Recent Labs    12/09/20 0615 12/09/20 0805 12/10/20 0806 12/11/20 0425  WBC 13.0*  --   --  11.2*  HGB 6.4*   < > 7.1* 9.3*  HCT 18.6*   < > 20.4* 26.8*  MCV 84.5  --   --  83.2  PLT 210  --   --  130*   < > = values in this interval not displayed.   Cardiac Enzymes: No results for input(s): CKTOTAL, CKMB, CKMBINDEX, TROPONINI in the last 72 hours. BNP: Invalid input(s): POCBNP D-Dimer: No results for input(s): DDIMER in the last 72 hours. Hemoglobin A1C: No results for input(s): HGBA1C in the last 72 hours. Fasting Lipid Panel: No results for input(s): CHOL, HDL, LDLCALC, TRIG, CHOLHDL, LDLDIRECT in the last 72 hours. Thyroid Function Tests: No results for input(s): TSH, T4TOTAL, T3FREE, THYROIDAB in the last 72 hours.  Invalid input(s): FREET3 Anemia Panel: No results for input(s): VITAMINB12, FOLATE, FERRITIN, TIBC, IRON, RETICCTPCT in the last 72  hours.   PHYSICAL EXAM General: Well developed, well nourished, in no acute distress HEENT:  Normocephalic and atramatic Neck:  No JVD.  Lungs: Clear bilaterally to auscultation and percussion. Heart: HRRR . Normal S1 and S2 without gallops or murmurs.  Abdomen: Bowel sounds are positive, abdomen soft and non-tender  Msk:  Back normal, normal gait. Normal strength and tone for age. Extremities: No clubbing, cyanosis or edema.   Neuro: Alert and oriented X 3. Psych:  Good affect, responds appropriately  TELEMETRY: Sinus rhythm  ASSESSMENT AND PLAN: Status post atrial fibrillation with rapid ventricular response rate on amiodarone right now and remains in sinus rhythm.  Principal Problem:   Depression Active Problems:   AKI (acute kidney injury) (HCC)   Adult failure to thrive   Hypokalemia   Weight loss   Altered mental status   Tachycardia   Hypotension    Chelsea Glass A, MD, [facility] 12/11/2020 9:16 AM

## 2020-12-11 NOTE — Consult Note (Signed)
Melodie Bouillon, MD [address], [place], [place], [address] [address], [address], [place], [place], [address] Phone: [phone]  Fax: [phone]  Consultation  Referring Provider:     Dr. Marylu Lund Primary Care Physician:  Inc, [facility] Reason for Consultation:    Guaic positive stool  Date of Admission:  12/03/2020 Date of Consultation:  12/11/2020         HPI:   Chelsea Glass is a 65 y.o. female admitted with altered mental status with GI being consulted for anemia.  Patient somnolent with daughter at bedside.  As per hospital notes and family at bedside, patient has been excessively sleepy for the last 4 days.  Patient herself unable to provide history due to altered mental status.  No noted signs of gross active bleeding.  No melena, hematochezia, hematemesis.  Patient had A. fib with RVR on this admission and was receiving heparin drip as well.  Patient had Covid pneumonia 14 June 2020 and has had failure to thrive with poor p.o. intake since then with prior admissions to [facility] and [facility] since then for failure to thrive.  Patient underwent IR guided feeding tube placement 3/4 due to dysphagia noted by speech therapy  Daughter reports patient may have had an EGD and colonoscopy over 10 years ago, results not available.  Past Medical History:  Diagnosis Date  . Arthritis   . Diabetes mellitus without complication (HCC)   . Hypertension     Past Surgical History:  Procedure Laterality Date  . IR FLUORO GUIDE CV LINE RIGHT  12/09/2020  . IR GASTROSTOMY TUBE MOD SED  12/09/2020  . TUBAL LIGATION    . UTERINE FIBROID SURGERY      Prior to Admission medications   Medication Sig Start Date End Date Taking? Authorizing Provider  DULoxetine (CYMBALTA) 30 MG capsule Take 30 mg by mouth daily. 11/18/20  Yes [provider]  metoprolol succinate (TOPROL-XL) 100 MG 24 hr tablet Take 100 mg by mouth daily. 11/30/20  Yes [provider]  mirtazapine (REMERON) 15 MG tablet Take 15 mg by mouth at bedtime. 10/06/20  Yes [provider]    History reviewed. No pertinent family history.   Social History   Tobacco Use  . Smoking status: Never Smoker  . Smokeless tobacco: Never Used  Substance Use Topics  . Alcohol use: No  . Drug use: No    Allergies as of 12/03/2020 - Review Complete 12/03/2020  Allergen Reaction Noted  . Lisinopril Swelling 09/22/2016    Review of Systems:    All systems reviewed and negative except where noted in HPI.   Physical Exam:  Vital signs in last 24 hours: Vitals:   12/11/20 0412 12/11/20 0813 12/11/20 1135 12/11/20 1527  BP: 101/63 (!) 90/59 (!) 83/57 (!) 83/55  Pulse: 91 88 92 88  Resp: 18 18 18 18   Temp: 98.6 F (37 C) 98.2 F (36.8 C) 99 F (37.2 C) 98.8 F (37.1 C)  TempSrc: Oral Oral Oral Oral  SpO2: 100% 100% 99% 97%  Weight:      Height:       Last BM Date: 12/11/20 General:   Somnolent, no acute distress Head:  Normocephalic and atraumatic. Eyes:   No icterus.   Conjunctiva pink. PERRLA. Ears:  Normal auditory acuity. Neck:  Supple; no masses or thyroidomegaly Lungs: Respirations even and unlabored. Lungs clear to auscultation bilaterally.   No wheezes, crackles, or rhonchi.  Abdomen:  Soft,  nondistended, nontender. Normal bowel sounds. No appreciable masses or hepatomegaly.  No rebound or guarding.  Feeding tube in place, with no bleeding or evidence of breakdown on external skin Neurologic: Altered mental status Skin:  Intact without significant lesions or rashes. Cervical Nodes:  No significant cervical adenopathy. Psych:  Alert and cooperative. Normal affect.  LAB RESULTS: Recent Labs    12/09/20 0615 12/09/20 0805 12/10/20 0806 12/11/20 0425  WBC 13.0*  --   --  11.2*  HGB 6.4* 6.3* 7.1* 9.3*  HCT 18.6* 19.1* 20.4* 26.8*  PLT 210  --   --  130*   BMET Recent Labs    12/09/20 0615 12/10/20 0806 12/10/20 1608 12/11/20 0425  NA 140  138  --  136  K 3.6 2.9* 4.0 3.9  CL 114* 114*  --  111  CO2 18* 16*  --  18*  GLUCOSE 157* 147*  --  183*  BUN 17 16  --  18  CREATININE 1.29* 1.32*  --  1.52*  CALCIUM 8.3* 7.8*  --  8.4*   LFT Recent Labs    12/09/20 0615 12/10/20 0806 12/11/20 0425  PROT 4.2*  --   --   ALBUMIN 1.8*   < > 1.6*  AST 33  --   --   ALT 41  --   --   ALKPHOS 180*  --   --   BILITOT 0.7  --   --    < > = values in this interval not displayed.   PT/INR No results for input(s): LABPROT, INR in the last 72 hours.  STUDIES: No results found.    Impression / Plan:   Chelsea Glass is a 65 y.o. y/o female with failure to thrive, decreased p.o. intake, since Covid infection September 2021, admitted with altered mental status, and underwent IR guided feeding tube placement 2 days ago, with GI consulted for anemia and guaiac positive stool in the setting of heparin drip for A. Fib  I would recommend starting with anemia work-up with ferritin, iron labs, B12 and folate to evaluate for etiology of anemia  At this time, anemia is normocytic  If significant iron deficiency present, IV iron would help  Given her mental status, patient would not be able to tolerate oral colonoscopy prep, and this will need to be administered through her feeding tube  Once anemia work-up is available, if it does show iron deficiency, EGD and colonoscopy can be considered for further evaluation, with care during the procedure to not dislodge the feeding tube given that the surgical tract is still immature  Would recommend PPI twice daily given guaiac positive stools, anemia and patient may have developed small ulcers or gastritis, or esophagitis given her clinical condition and frequent hospitalizations in September 2021  No alarm symptoms present to indicate urgent endoscopy at this time  PPI IV twice daily  Continue serial CBCs and transfuse PRN Avoid NSAIDs Maintain 2 large-bore IV lines Please page GI with  any acute hemodynamic changes, or signs of active GI bleeding  Dr. Allegra Lai to follow from tomorrow and will determine need and timing of EGD or colonoscopy as necessary  Thank you for involving me in the care of this patient.      LOS: 8 days   Pasty Spillers, MD  12/11/2020, 7:26 PM

## 2020-12-11 NOTE — Progress Notes (Signed)
PROGRESS NOTE    Chelsea Glass  NGE:[id number] DOB: March 23, 1956 DOA: 12/03/2020 PCP: Inc, [facility]    Brief Narrative:  Chelsea Glass is a 65 y.o. African-American female with medical history significant for osteoarthritis, type 2 diabetes mellitus and hypertension, as well as COVID-19 in September 2021, who presented to the emergency room with acute onset of altered mental status and not acting herself for the last 4 days with excessive sleepiness. She has been having nausea and dry heaves without abdominal pain.  Her daughter stated that she has not eaten much and has been bed bound since September 2021 since her COVID-19 infection. Patient was admitted to the hospitalist for further work-up and treatment of acute metabolic encephalopathy and failure to thrive  On 3/1-pt was tachycardic with HR in 140's, transition to atrial fibrillation with RVR, with some transient hypotension requiring transfer to the ICU.  Hypotension resolved with 500 cc normal saline.  PCCM was consulted for further assistance along with placement of central venous access due to lack of peripheral IV access.   Cardiology consulted on 3/1-recom amiodarone loading dose . Also placed orders for dig ivp if hr dosent stabilized. Heparin infusion for Chadsvas score 4.  3/2-hospitalist pickup   3/3-Hg 6.5 this am, repeat Hg 7.4 3/4-hemoglobin 6.4 and repeat 6.3.  Talk to daughter she is agreeable to blood transfusion.  Low urine output  3/5-low urine output. Hg 7.1 3/6-creatinine up 1.52. . S/p 1 unit prbc on 3/5. Feeding was started on 3/5.   consultants:   PCCM, psych, surgery, cardiology, palliative, nephrology  Procedures:   Antimicrobials:       Subjective: Pt quiet, but answers questions. Denies sob, pain, or any other complaints  Objective: Vitals:   12/11/20 0154 12/11/20 0412 12/11/20 0813 12/11/20 1135  BP: 103/65 101/63 (!) 90/59 (!) 83/57  Pulse: 94 91 88 92  Resp:  Temp: 97.6 F (36.4 C) 98.6 F (37 C) 98.2 F (36.8 C) 99 F (37.2 C)  TempSrc: Oral Oral Oral Oral  SpO2: 99% 100% 100% 99%  Weight:      Height:        Intake/Output Summary (Last 24 hours) at 12/11/2020 1317 Last data filed at 12/11/2020 0500 Gross per 24 hour  Intake 430 ml  Output 100 ml  Net 330 ml   Filed Weights   12/08/20 0349 12/09/20 0045 12/11/20 0119  Weight: 103 kg 112 kg 120.2 kg    Examination: Lying side in bed, daughter at bedside, improved color cta no w/r/r Regular S1-S2 no gallops Soft benign G-tube in place Edematous x4 Awake, grossly intact Mood and affect appropriate in current setting    Data Reviewed: I have personally reviewed following labs and imaging studies  CBC: Recent Labs  Lab 12/05/20 0432 12/06/20 0754 12/07/20 0730 12/08/20 0810 12/08/20 0930 12/09/20 0615 12/09/20 0805 12/10/20 0806 12/11/20 0425  WBC 12.2* 9.4 10.8* 13.3*  --  13.0*  --   --  11.2*  NEUTROABS 6.9  --   --   --   --   --   --   --   --   HGB 13.1 10.8* 9.6* 6.5* 7.4* 6.4* 6.3* 7.1* 9.3*  HCT 37.9 30.8* 28.0* 19.1* 21.9* 18.6* 19.1* 20.4* 26.8*  MCV 83.1 82.4 84.1 83.8  --  84.5  --   --  83.2  PLT 235 269 247 214  --  210  --   --  130*  Basic Metabolic Panel: Recent Labs  Lab 12/07/20 0437 12/07/20 1642 12/08/20 0501 12/09/20 0615 12/10/20 0806 12/10/20 1608 12/11/20 0425  NA 139  --  141 140 138  --  136  K 3.9  --  3.3* 3.6 2.9* 4.0 3.9  CL 113*  --  115* 114* 114*  --  111  CO2 19*  --  19* 18* 16*  --  18*  GLUCOSE 161*  --  191* 157* 147*  --  183*  BUN 20  --  --  18  CREATININE 1.05*  --  1.35* 1.29* 1.32*  --  1.52*  CALCIUM 8.5*  --  8.5* 8.3* 7.8*  --  8.4*  MG 1.9 1.7 1.6* 1.8 1.7  --  2.0  PHOS 2.4* 1.8* 2.0* 2.4* 2.7  --  2.8   GFR: Estimated Creatinine Clearance: 47.3 mL/min (A) (by C-G formula based on SCr of 1.52 mg/dL (H)). Liver Function Tests: Recent Labs  Lab 12/06/20 1647 12/07/20 0437  12/08/20 0501 12/09/20 0615 12/10/20 0806 12/11/20 0425  AST 33  --   --   ALT 34 34 35 41  --   --   ALKPHOS 239* 250* 195* 180*  --   --   BILITOT 1.0 0.5 0.6 0.7  --   --   PROT 4.9* 4.9* 4.3* 4.2*  --   --   ALBUMIN 2.0* 2.0* 1.8* 1.8* 1.5* 1.6*   No results for input(s): LIPASE, AMYLASE in the last 168 hours. No results for input(s): AMMONIA in the last 168 hours. Coagulation Profile: Recent Labs  Lab 12/06/20 1647  INR 1.3*   Cardiac Enzymes: No results for input(s): CKTOTAL, CKMB, CKMBINDEX, TROPONINI in the last 168 hours. BNP (last 3 results) No results for input(s): PROBNP in the last 8760 hours. HbA1C: No results for input(s): HGBA1C in the last 72 hours. CBG: Recent Labs  Lab 12/10/20 2024 12/11/20 0020 12/11/20 0418 12/11/20 0812 12/11/20 1136  GLUCAP 154* 170* 164* 156* 149*   Lipid Profile: No results for input(s): CHOL, HDL, LDLCALC, TRIG, CHOLHDL, LDLDIRECT in the last 72 hours. Thyroid Function Tests: No results for input(s): TSH, T4TOTAL, FREET4, T3FREE, THYROIDAB in the last 72 hours. Anemia Panel: No results for input(s): VITAMINB12, FOLATE, FERRITIN, TIBC, IRON, RETICCTPCT in the last 72 hours. Sepsis Labs: Recent Labs  Lab 12/06/20 1647  PROCALCITON <0.10    Recent Results (from the past 240 hour(s))  Resp Panel by RT-PCR (Flu A&B, Covid) Nasopharyngeal Swab     Status: None   Collection Time: 12/03/20  1:35 PM   Specimen: Nasopharyngeal Swab; Nasopharyngeal(NP) swabs in vial transport medium  Result Value Ref Range Status   SARS Coronavirus 2 by RT PCR NEGATIVE NEGATIVE Final    Comment: (NOTE) SARS-CoV-2 target nucleic acids are NOT DETECTED.  The SARS-CoV-2 RNA is generally detectable in upper respiratory specimens during the acute phase of infection. The lowest concentration of SARS-CoV-2 viral copies this assay can detect is 138 copies/mL. A negative result does not preclude SARS-Cov-2 infection and should not be used  as the sole basis for treatment or other patient management decisions. A negative result may occur with  improper specimen collection/handling, submission of specimen other than nasopharyngeal swab, presence of viral mutation(s) within the areas targeted by this assay, and inadequate number of viral copies(<138 copies/mL). A negative result must be combined with clinical observations, patient history, and epidemiological information. The expected result is Negative.  Fact  Sheet for Patients:  BloggerCourse.comhttps://www.fda.gov/media/152166/download  Fact Sheet for Healthcare Providers:  SeriousBroker.ithttps://www.fda.gov/media/152162/download  This test is no t yet approved or cleared by the [place] FDA and  has been authorized for detection and/or diagnosis of SARS-CoV-2 by FDA under an Emergency Use Authorization (EUA). This EUA will remain  in effect (meaning this test can be used) for the duration of the COVID-19 declaration under Section 564(b)(1) of the Act, 21 U.S.C.section 360bbb-3(b)(1), unless the authorization is terminated  or revoked sooner.       Influenza A by PCR NEGATIVE NEGATIVE Final   Influenza B by PCR NEGATIVE NEGATIVE Final    Comment: (NOTE) The Xpert Xpress SARS-CoV-2/FLU/RSV plus assay is intended as an aid in the diagnosis of influenza from Nasopharyngeal swab specimens and should not be used as a sole basis for treatment. Nasal washings and aspirates are unacceptable for Xpert Xpress SARS-CoV-2/FLU/RSV testing.  Fact Sheet for Patients: BloggerCourse.comhttps://www.fda.gov/media/152166/download  Fact Sheet for Healthcare Providers: SeriousBroker.ithttps://www.fda.gov/media/152162/download  This test is not yet approved or cleared by the [place] FDA and has been authorized for detection and/or diagnosis of SARS-CoV-2 by FDA under an Emergency Use Authorization (EUA). This EUA will remain in effect (meaning this test can be used) for the duration of the COVID-19 declaration under Section 564(b)(1)  of the Act, 21 U.S.C. section 360bbb-3(b)(1), unless the authorization is terminated or revoked.  Performed at [facility], [address] [place], [place] [address]   CULTURE, BLOOD (ROUTINE X 2) w Reflex to ID Panel     Status: None   Collection Time: 12/06/20 10:55 AM   Specimen: BLOOD  Result Value Ref Range Status   Specimen Description BLOOD LEFT WRIST  Final   Special Requests   Final    BOTTLES DRAWN AEROBIC ONLY Blood Culture adequate volume   Culture   Final    NO GROWTH 5 DAYS Performed at [facility], [address] [place], [place] [address]    Report Status 12/11/2020 FINAL  Final  CULTURE, BLOOD (ROUTINE X 2) w Reflex to ID Panel     Status: None   Collection Time: 12/06/20 11:05 AM   Specimen: BLOOD  Result Value Ref Range Status   Specimen Description BLOOD RIGHT WRIST  Final   Special Requests   Final    BOTTLES DRAWN AEROBIC ONLY Blood Culture results may not be optimal due to an inadequate volume of blood received in culture bottles   Culture   Final    NO GROWTH 5 DAYS Performed at [facility], [address] [place], [place] [address]    Report Status 12/11/2020 FINAL  Final  MRSA PCR Screening     Status: None   Collection Time: 12/06/20  1:18 PM   Specimen: Nasal Mucosa; Nasopharyngeal  Result Value Ref Range Status   MRSA by PCR NEGATIVE NEGATIVE Final    Comment:        The GeneXpert MRSA Assay (FDA approved for NASAL specimens only), is one component of a comprehensive MRSA colonization surveillance program. It is not intended to diagnose MRSA infection nor to guide or monitor treatment for MRSA infections. Performed at [facility], [address] [place], [place] [address]   Urine Culture     Status: None   Collection Time: 12/06/20  1:18 PM   Specimen: Urine, Random  Result Value Ref Range Status   Specimen Description   Final    URINE, RANDOM Performed at [facility], [address] [place], [place] [address]    Special Requests   Final    NONE Performed at [facility], [address] [place], [place] [address]    Culture   Final    NO GROWTH Performed at [facility], [address] [place], [place] [address]    Report Status 12/08/2020 FINAL  Final         Radiology Studies: No results found.      Scheduled Meds: . sodium chloride   Intravenous Once  . sodium chloride   Intravenous Once  . amiodarone  200 mg Oral BID  . Chlorhexidine Gluconate Cloth  6 each Topical Daily  . diclofenac Sodium  2 g Topical QID  . DULoxetine  30 mg Oral Daily  . feeding supplement (OSMOLITE 1.2 CAL)  237 mL Per Tube 6 X Daily  . feeding supplement (PROSource TF)  45 mL Per Tube BID  . insulin aspart  0-9 Units Subcutaneous Q4H  . lidocaine  1 patch Transdermal Q24H  . midodrine  5 mg Per Tube TID WC  . mirtazapine  45 mg Oral QHS  . OLANZapine zydis  5 mg Oral QHS   Continuous Infusions: . sodium chloride 500 mL (12/10/20 1536)  . albumin human 25 g (12/11/20 1237)  .  ceFAZolin (ANCEF) IV      Assessment & Plan:   Principal Problem:   Depression Active Problems:   AKI (acute kidney injury) (HCC)   Adult failure to thrive   Hypokalemia   Weight loss   Altered mental status   Tachycardia   Hypotension   Chelsea Thompsonis a 65 y.o.African-American femalewith medical history significant forosteoarthritis, type 2 diabetes mellitus and hypertension, as well as COVID-19 in September 2021, who presented to the emergency room with acute onset of altered mental status and not acting herself for the last 4 days with excessive sleepiness.She has been having nausea and dry heaves without abdominal pain. Her daughter stated that she has not eaten much and has been bed bound since September 2021 since her COVID-19 infection.    Adult failure to thrive/poor PO intake/generalized deconditioning Hypokalemia/depressed mood --  patient came in with ongoing weight loss and poor PO intake per daughter. Since COVID infection in September 2021 per daughter patient has been going downhill. --She was admitted at [facility] discharge from [facility] with adult failure to thrive and poor PO intake. Patient also went to rehab in [place]. -- She recently was seen in [facility] emergency room on 10th of February she was discharged to go home with home health and daughter thinking patient will hopefully do better with her mood and eating. -- Daughter says patient has lost more than hundred pounds since September 2021. She has not ambulated and no interest in any acitivity. Stays in bed most of the time --she is requesting if patient can get a feeding tube -- dietitian consult-- for PEG/NG feeding -- speech therapy-- patient showed very little interest in participating with speech therapy. Pt has Dsyphagia and at risk for aspiration. --spoke with IR RN regarding PICC line and G-tube/peg tube placement  --discussed with daughter risk and complications of feeding tube, she voiced understanding patient's daughter will sign the consent. --3/1-- patient is tachycardic despite IV fluids and IV metoprolol 5 mg x1 dose soft blood pressure and continued encephalopathy. Will's transfer her to ICU --spoke with ICU NP Jeri Modena to follow pt.--pt will need Central line due to poor IV access.  3/2-TRH pickup.  3/4-Plan for IR for Peg placement today pending  3/5-s/p PEG tube placement on 3/4 3/6-Feeding initiated on 3/5 and tolerating F/u nutrition consult Plan for albumin infusion today     Hypotension with sinus tachycardia, acute renal failure Hypokalemia hypothermic AKI improved with ivf...>up mildly again at 1.35, possibly prerenal due to hypotension 3/4-she is anemic and still hypotensive  3/5-add midodrine Stool occult pending  S/p 1 unit prbc transfusion on 3/4 and 3/5 3/6-increase midodrine to 5mg  tid Albumin infusion per  nephrology ivf d/c'd On warming device  Atrial fib-rvr Was started on amiodarone infusion .  Cards following Now converted to NSR 3/4 heparin was discontinued on 3/3 due to drop in hemoglobin  We will continue to hold heparin  3/6-on po amiodarone Awaiting stool occult prior to resuming a/c Cards following Beta blk held due to hypotention    Acute drop in H/H-initial hemoglobin this a.m. was 6.5 from 9.6.  Repeat is 7.4. Will hold Toradol, heparin drip Check stool occult to rule out GI bleed Transfuse if hg 7 or >. Discussed this with pt's daughter as we need consent, discussed complications of transfusion including transfusion reaction and even infection, she verbalized an understanding. 3/4 will transfuse 1 unit prbc now. 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 Neuro- recommends discussed with neurosurgery. --3/1-- per Dr 11-02-1996 "This looks Glass stable compared to CT from previous. This is likely benign, possible even cystic. She would need a contrasted image to delineate. No urgent intervention needed as we know it has been there for some time. Unrelated to current issues and wont cause encephalopathy  3/2-f/u neurosurgery as outpt to discuss options going forward once she recovers from acute illness. No intervention recommended at this time.    Abnormal ultrasound abdomen with gallbladder distention similar to previous imaging studies -- clinically patient does  not appear to be septic or having any infection -- discontinue IV antibiotics-- no source of infection noted so far, chest x-ray negative for pneumonia. Appears to have some opacities from previous COVID infection.sats are more than 92% on room air 3/2-surgery Dr. Adriana Simas - no surgical indication at present for gallbladder. Labs stable.     Depressed mood -- continue Cymbalta , remeron --  psych consult with Dr. Lady Gary.   -- patient has appointment for geriatric evaluation at [facility] per daughter in April 2022  Type II diabetes with sugars controlled -- sliding scale insulin -- hold PO diabetes meds  History of tachycardia/SVT -- patient follows with cardiology at [facility] -- recently started on Toprol-XL hundred milligrams daily. With soft blood pressure decreased dose to 25 mg daily..>discontinued   Generalized deconditioning -- PT evaluation noted--rec SNF  Palliative following   DVT prophylaxis: scd Code Status:full Family Communication: daugter updated  Status is: Inpatient Patient is inpatient due to severity of her illness needing hospitalization , need IV treatments  Dispo: The patient is from: Home              Anticipated d/c is to: SNF  Patient currently is not medically stable to d/c.   Difficult to place patient No  Pt UO is low, anemic, getting transfuion, iv albumin           LOS: 8 days   Time spent: 35 min with >50% on coc    Lynn Ito, MD [facility] Pager [phone]  If 7PM-7AM, please contact night-coverage 12/11/2020, 1:17 PM

## 2020-12-12 DIAGNOSIS — Z515 Encounter for palliative care: Secondary | ICD-10-CM | POA: Diagnosis not present

## 2020-12-12 DIAGNOSIS — Z7189 Other specified counseling: Secondary | ICD-10-CM | POA: Diagnosis not present

## 2020-12-12 DIAGNOSIS — F32A Depression, unspecified: Secondary | ICD-10-CM | POA: Diagnosis not present

## 2020-12-12 DIAGNOSIS — R627 Adult failure to thrive: Secondary | ICD-10-CM | POA: Diagnosis not present

## 2020-12-12 DIAGNOSIS — D649 Anemia, unspecified: Secondary | ICD-10-CM

## 2020-12-12 DIAGNOSIS — N179 Acute kidney failure, unspecified: Secondary | ICD-10-CM | POA: Diagnosis not present

## 2020-12-12 DIAGNOSIS — R400 Somnolence: Secondary | ICD-10-CM | POA: Diagnosis not present

## 2020-12-12 LAB — BASIC METABOLIC PANEL
Anion gap: 7 (ref 5–15)
BUN: 20 mg/dL (ref 8–23)
CO2: 19 mmol/L — ABNORMAL LOW (ref 22–32)
Calcium: 8.8 mg/dL — ABNORMAL LOW (ref 8.9–10.3)
Chloride: 111 mmol/L (ref 98–111)
Creatinine, Ser: 1.61 mg/dL — ABNORMAL HIGH (ref 0.44–1.00)
GFR, Estimated: 35 mL/min — ABNORMAL LOW (ref >60)
Glucose, Bld: 101 mg/dL — ABNORMAL HIGH (ref 70–99)
Potassium: 4.2 mmol/L (ref 3.5–5.1)
Sodium: 137 mmol/L (ref 135–145)

## 2020-12-12 LAB — PROTEIN ELECTROPHORESIS, SERUM
A/G Ratio: 1.2 (ref 0.7–1.7)
Albumin ELP: 2.1 g/dL — ABNORMAL LOW (ref 2.9–4.4)
Alpha-1-Globulin: 0.2 g/dL (ref 0.0–0.4)
Alpha-2-Globulin: 0.3 g/dL — ABNORMAL LOW (ref 0.4–1.0)
Beta Globulin: 0.7 g/dL (ref 0.7–1.3)
Gamma Globulin: 0.6 g/dL (ref 0.4–1.8)
Globulin, Total: 1.8 g/dL — ABNORMAL LOW (ref 2.2–3.9)
Total Protein ELP: 3.9 g/dL — ABNORMAL LOW (ref 6.0–8.5)

## 2020-12-12 LAB — IRON AND TIBC: Iron: 55 ug/dL (ref 28–170)

## 2020-12-12 LAB — GLUCOSE, CAPILLARY
Glucose-Capillary: 102 mg/dL — ABNORMAL HIGH (ref 70–99)
Glucose-Capillary: 107 mg/dL — ABNORMAL HIGH (ref 70–99)
Glucose-Capillary: 107 mg/dL — ABNORMAL HIGH (ref 70–99)
Glucose-Capillary: 119 mg/dL — ABNORMAL HIGH (ref 70–99)
Glucose-Capillary: 138 mg/dL — ABNORMAL HIGH (ref 70–99)
Glucose-Capillary: 159 mg/dL — ABNORMAL HIGH (ref 70–99)
Glucose-Capillary: 92 mg/dL (ref 70–99)

## 2020-12-12 LAB — MAGNESIUM: Magnesium: 2.1 mg/dL (ref 1.7–2.4)

## 2020-12-12 LAB — PHOSPHORUS: Phosphorus: 2.3 mg/dL — ABNORMAL LOW (ref 2.5–4.6)

## 2020-12-12 LAB — FOLATE: Folate: 5.1 ng/mL — ABNORMAL LOW (ref >5.9)

## 2020-12-12 LAB — FERRITIN: Ferritin: 887 ng/mL — ABNORMAL HIGH (ref 11–307)

## 2020-12-12 MED ORDER — ASCORBIC ACID 500 MG PO TABS
250.0000 mg | ORAL_TABLET | Freq: Two times a day (BID) | ORAL | Status: DC
  Administered 2020-12-12 – 2020-12-16 (×8): 250 mg
  Filled 2020-12-12 (×4): qty 1
  Filled 2020-12-12: qty 0.5
  Filled 2020-12-12 (×3): qty 1

## 2020-12-12 MED ORDER — MAGNESIUM HYDROXIDE 400 MG/5ML PO SUSP: 30.0000 mL | Freq: Every day | ORAL | Status: DC | PRN

## 2020-12-12 MED ORDER — ACETAMINOPHEN 650 MG RE SUPP: 650.0000 mg | Freq: Four times a day (QID) | RECTAL | Status: DC | PRN

## 2020-12-12 MED ORDER — ONDANSETRON HCL 4 MG/2ML IJ SOLN
4.0000 mg | Freq: Four times a day (QID) | INTRAMUSCULAR | Status: DC | PRN
  Administered 2020-12-12 – 2020-12-13 (×3): 4 mg via INTRAVENOUS
  Filled 2020-12-12 (×5): qty 2

## 2020-12-12 MED ORDER — PANTOPRAZOLE SODIUM 40 MG PO TBEC
40.0000 mg | DELAYED_RELEASE_TABLET | Freq: Two times a day (BID) | ORAL | Status: DC
  Administered 2020-12-13 (×2): 40 mg via ORAL
  Filled 2020-12-12 (×3): qty 1

## 2020-12-12 MED ORDER — ACETAMINOPHEN 325 MG PO TABS
650.0000 mg | ORAL_TABLET | Freq: Four times a day (QID) | ORAL | Status: DC | PRN
  Administered 2020-12-13 – 2020-12-14 (×2): 650 mg
  Filled 2020-12-12 (×2): qty 2

## 2020-12-12 MED ORDER — OSMOLITE 1.5 CAL PO LIQD
237.0000 mL | Freq: Every day | ORAL | Status: DC
  Administered 2020-12-12 – 2020-12-13 (×6): 237 mL

## 2020-12-12 MED ORDER — MIDODRINE HCL 5 MG PO TABS
10.0000 mg | ORAL_TABLET | Freq: Three times a day (TID) | ORAL | Status: DC
  Administered 2020-12-13 – 2020-12-16 (×12): 10 mg
  Filled 2020-12-12 (×11): qty 2

## 2020-12-12 MED ORDER — AMIODARONE HCL 200 MG PO TABS
200.0000 mg | ORAL_TABLET | Freq: Two times a day (BID) | ORAL | Status: DC
  Administered 2020-12-12 – 2020-12-15 (×7): 200 mg
  Filled 2020-12-12 (×7): qty 1

## 2020-12-12 MED ORDER — POTASSIUM & SODIUM PHOSPHATES 280-160-250 MG PO PACK
2.0000 | PACK | ORAL | Status: AC
  Administered 2020-12-12 (×2): 2
  Filled 2020-12-12 (×2): qty 2

## 2020-12-12 MED ORDER — POTASSIUM & SODIUM PHOSPHATES 280-160-250 MG PO PACK
2.0000 | PACK | ORAL | Status: DC
  Filled 2020-12-12 (×2): qty 2

## 2020-12-12 MED ORDER — ONDANSETRON HCL 4 MG PO TABS: 4.0000 mg | ORAL_TABLET | Freq: Four times a day (QID) | ORAL | Status: DC | PRN

## 2020-12-12 MED ORDER — ACETAMINOPHEN 325 MG PO TABS: 650.0000 mg | ORAL_TABLET | Freq: Four times a day (QID) | ORAL | Status: DC | PRN

## 2020-12-12 MED ORDER — FREE WATER
100.0000 mL | Freq: Every day | Status: DC
  Administered 2020-12-12 – 2020-12-16 (×26): 100 mL

## 2020-12-12 NOTE — Progress Notes (Signed)
PROGRESS NOTE    Chelsea Glass  LFY:[id number] DOB: [birth year] DOA: 12/03/2020 PCP: [facility]    Brief Narrative:  Chelsea Glass is a 65 y.o. African-American female with medical history significant for osteoarthritis, type 2 diabetes mellitus and hypertension, as well as COVID-19 in September 2021, who presented to the emergency room with acute onset of altered mental status and not acting herself for the last 4 days with excessive sleepiness. She has been having nausea and dry heaves without abdominal pain.  Her daughter stated that she has not eaten much and has been bed bound since September 2021 since her COVID-19 infection. Patient was admitted to the hospitalist for further work-up and treatment of acute metabolic encephalopathy and failure to thrive  On 3/1-pt was tachycardic with HR in 140's, transition to atrial fibrillation with RVR, with some transient hypotension requiring transfer to the ICU.  Hypotension resolved with 500 cc normal saline.  PCCM was consulted for further assistance along with placement of central venous access due to lack of peripheral IV access.   Cardiology consulted on 3/1-recom amiodarone loading dose . Also placed orders for dig ivp if hr dosent stabilized. Heparin infusion for Chadsvas score 4.  3/2-hospitalist pickup   3/3-Hg 6.5 this am, repeat Hg 7.4 3/4-hemoglobin 6.4 and repeat 6.3.  Talk to daughter she is agreeable to blood transfusion.  Low urine output  3/5-low urine output. Hg 7.1 3/6-creatinine up 1.52. . S/p 1 unit prbc on 3/5. Feeding was started on 3/5. 3/7-more communicative today. Has no complaints.   consultants:   PCCM, psych, surgery, cardiology, palliative, nephrology  Procedures:   Antimicrobials:       Subjective: Denies, chills, dizziness, sob, cp  Objective: Vitals:   12/12/20 0014 12/12/20 0353 12/12/20 0500 12/12/20 0813  BP:  (!) 99/52  (!) 90/56  Pulse:  100  97  Resp:  16  16   Temp: 98.2 F (36.8 C) 98.9 F (37.2 C)  98.6 F (37 C)  TempSrc: Oral Oral  Oral  SpO2:  99%  100%  Weight:   126.4 kg   Height:        Intake/Output Summary (Last 24 hours) at 12/12/2020 0851 Last data filed at 12/12/2020 0454 Gross per 24 hour  Intake 262.51 ml  Output 150 ml  Net 112.51 ml   Filed Weights   12/09/20 0045 12/11/20 0119 12/12/20 0500  Weight: 112 kg 120.2 kg 126.4 kg    Examination: Lying in bed with warm  Anteriorly cta no wheezing Regular s1/s2 no gallop Soft benign +bs Edematous x4 Awake, alert and oriented to time and person but not to place Mood and affect appropriate in current setting    Data Reviewed: I have personally reviewed following labs and imaging studies  CBC: Recent Labs  Lab 12/06/20 0754 12/07/20 0730 12/08/20 0810 12/08/20 0930 12/09/20 0615 12/09/20 0805 12/10/20 0806 12/11/20 0425  WBC 9.4 10.8* 13.3*  --  13.0*  --   --  11.2*  HGB 10.8* 9.6* 6.5* 7.4* 6.4* 6.3* 7.1* 9.3*  HCT 30.8* 28.0* 19.1* 21.9* 18.6* 19.1* 20.4* 26.8*  MCV 82.4 84.1 83.8  --  84.5  --   --  83.2  PLT 269 247 214  --  210  --   --  130*   Basic Metabolic Panel: Recent Labs  Lab 12/08/20 0501 12/09/20 0615 12/10/20 0806 12/10/20 1608 12/11/20 0425 12/12/20 0450  NA 141 140 138  --  136 137  K 3.3*  3.6 2.9* 4.0 3.9 4.2  CL 115* 114* 114*  --  111 111  CO2 19* 18* 16*  --  18* 19*  GLUCOSE 191* 157* 147*  --  183* 101*  BUN --  18 20  CREATININE 1.35* 1.29* 1.32*  --  1.52* 1.61*  CALCIUM 8.5* 8.3* 7.8*  --  8.4* 8.8*  MG 1.6* 1.8 1.7  --  2.0 2.1  PHOS 2.0* 2.4* 2.7  --  2.8 2.3*   GFR: Estimated Creatinine Clearance: 46 mL/min (A) (by C-G formula based on SCr of 1.61 mg/dL (H)). Liver Function Tests: Recent Labs  Lab 12/06/20 1647 12/07/20 0437 12/08/20 0501 12/09/20 0615 12/10/20 0806 12/11/20 0425  AST 33  --   --   ALT 34 34 35 41  --   --   ALKPHOS 239* 250* 195* 180*  --   --   BILITOT 1.0 0.5 0.6  0.7  --   --   PROT 4.9* 4.9* 4.3* 4.2*  --   --   ALBUMIN 2.0* 2.0* 1.8* 1.8* 1.5* 1.6*   No results for input(s): LIPASE, AMYLASE in the last 168 hours. No results for input(s): AMMONIA in the last 168 hours. Coagulation Profile: Recent Labs  Lab 12/06/20 1647  INR 1.3*   Cardiac Enzymes: No results for input(s): CKTOTAL, CKMB, CKMBINDEX, TROPONINI in the last 168 hours. BNP (last 3 results) No results for input(s): PROBNP in the last 8760 hours. HbA1C: No results for input(s): HGBA1C in the last 72 hours. CBG: Recent Labs  Lab 12/11/20 1631 12/11/20 2049 12/12/20 0013 12/12/20 0355 12/12/20 0811  GLUCAP 134* 132* 119* 102* 107*   Lipid Profile: No results for input(s): CHOL, HDL, LDLCALC, TRIG, CHOLHDL, LDLDIRECT in the last 72 hours. Thyroid Function Tests: No results for input(s): TSH, T4TOTAL, FREET4, T3FREE, THYROIDAB in the last 72 hours. Anemia Panel: No results for input(s): VITAMINB12, FOLATE, FERRITIN, TIBC, IRON, RETICCTPCT in the last 72 hours. Sepsis Labs: Recent Labs  Lab 12/06/20 1647  PROCALCITON <0.10    Recent Results (from the past 240 hour(s))  Resp Panel by RT-PCR (Flu A&B, Covid) Nasopharyngeal Swab     Status: None   Collection Time: 12/03/20  1:35 PM   Specimen: Nasopharyngeal Swab; Nasopharyngeal(NP) swabs in vial transport medium  Result Value Ref Range Status   SARS Coronavirus 2 by RT PCR NEGATIVE NEGATIVE Final    Comment: (NOTE) SARS-CoV-2 target nucleic acids are NOT DETECTED.  The SARS-CoV-2 RNA is generally detectable in upper respiratory specimens during the acute phase of infection. The lowest concentration of SARS-CoV-2 viral copies this assay can detect is 138 copies/mL. A negative result does not preclude SARS-Cov-2 infection and should not be used as the sole basis for treatment or other patient management decisions. A negative result may occur with  improper specimen collection/handling, submission of specimen  other than nasopharyngeal swab, presence of viral mutation(s) within the areas targeted by this assay, and inadequate number of viral copies(<138 copies/mL). A negative result must be combined with clinical observations, patient history, and epidemiological information. The expected result is Negative.  Fact Sheet for Patients:  BloggerCourse.com  Fact Sheet for Healthcare Providers:  SeriousBroker.it  This test is no t yet approved or cleared by the [place] FDA and  has been authorized for detection and/or diagnosis of SARS-CoV-2 by FDA under an Emergency Use Authorization (EUA). This EUA will remain  in effect (meaning this test can be used)  for the duration of the COVID-19 declaration under Section 564(b)(1) of the Act, 21 U.S.C.section 360bbb-3(b)(1), unless the authorization is terminated  or revoked sooner.       Influenza A by PCR NEGATIVE NEGATIVE Final   Influenza B by PCR NEGATIVE NEGATIVE Final    Comment: (NOTE) The Xpert Xpress SARS-CoV-2/FLU/RSV plus assay is intended as an aid in the diagnosis of influenza from Nasopharyngeal swab specimens and should not be used as a sole basis for treatment. Nasal washings and aspirates are unacceptable for Xpert Xpress SARS-CoV-2/FLU/RSV testing.  Fact Sheet for Patients: BloggerCourse.comhttps://www.fda.gov/media/152166/download  Fact Sheet for Healthcare Providers: SeriousBroker.ithttps://www.fda.gov/media/152162/download  This test is not yet approved or cleared by the [place] FDA and has been authorized for detection and/or diagnosis of SARS-CoV-2 by FDA under an Emergency Use Authorization (EUA). This EUA will remain in effect (meaning this test can be used) for the duration of the COVID-19 declaration under Section 564(b)(1) of the Act, 21 U.S.C. section 360bbb-3(b)(1), unless the authorization is terminated or revoked.  Performed at [facility], [address]  [place], [place] [address]   CULTURE, BLOOD (ROUTINE X 2) w Reflex to ID Panel     Status: None   Collection Time: 12/06/20 10:55 AM   Specimen: BLOOD  Result Value Ref Range Status   Specimen Description BLOOD LEFT WRIST  Final   Special Requests   Final    BOTTLES DRAWN AEROBIC ONLY Blood Culture adequate volume   Culture   Final    NO GROWTH 5 DAYS Performed at [facility], [address] [place], [place] [address]    Report Status 12/11/2020 FINAL  Final  CULTURE, BLOOD (ROUTINE X 2) w Reflex to ID Panel     Status: None   Collection Time: 12/06/20 11:05 AM   Specimen: BLOOD  Result Value Ref Range Status   Specimen Description BLOOD RIGHT WRIST  Final   Special Requests   Final    BOTTLES DRAWN AEROBIC ONLY Blood Culture results may not be optimal due to an inadequate volume of blood received in culture bottles   Culture   Final    NO GROWTH 5 DAYS Performed at [facility], [address] [place], [place] [address]    Report Status 12/11/2020 FINAL  Final  MRSA PCR Screening     Status: None   Collection Time: 12/06/20  1:18 PM   Specimen: Nasal Mucosa; Nasopharyngeal  Result Value Ref Range Status   MRSA by PCR NEGATIVE NEGATIVE Final    Comment:        The GeneXpert MRSA Assay (FDA approved for NASAL specimens only), is one component of a comprehensive MRSA colonization surveillance program. It is not intended to diagnose MRSA infection nor to guide or monitor treatment for MRSA infections. Performed at [facility], [address] [place], [place] [address]   Urine Culture     Status: None   Collection Time: 12/06/20  1:18 PM   Specimen: Urine, Random  Result Value Ref Range Status   Specimen Description   Final    URINE, RANDOM Performed at [facility], [address] [place], [place] [address]    Special Requests   Final    NONE Performed at [facility], [address] [place], [place] [address]     Culture   Final    NO GROWTH Performed at [facility], [address] [place], [place] [address]    Report Status  12/08/2020 FINAL  Final         Radiology Studies: No results found.      Scheduled Meds: . sodium chloride   Intravenous Once  . sodium chloride   Intravenous Once  . amiodarone  200 mg Oral BID  . Chlorhexidine Gluconate Cloth  6 each Topical Daily  . diclofenac Sodium  2 g Topical QID  . DULoxetine  30 mg Oral Daily  . feeding supplement (OSMOLITE 1.2 CAL)  237 mL Per Tube 6 X Daily  . feeding supplement (PROSource TF)  45 mL Per Tube BID  . insulin aspart  0-9 Units Subcutaneous Q4H  . lidocaine  1 patch Transdermal Q24H  . midodrine  5 mg Per Tube TID WC  . mirtazapine  45 mg Oral QHS  . OLANZapine zydis  5 mg Oral QHS  . pantoprazole (PROTONIX) IV  40 mg Intravenous Q12H  . potassium & sodium phosphates  2 packet Oral Q4H   Continuous Infusions: . sodium chloride Stopped (12/10/20 2201)  . albumin human 25 g (12/12/20 0454)    Assessment & Plan:   Principal Problem:   Depression Active Problems:   AKI (acute kidney injury) (HCC)   Adult failure to thrive   Hypokalemia   Weight loss   Altered mental status   Tachycardia   Hypotension   Chelsea Glass a 65 y.o.African-American femalewith medical history significant forosteoarthritis, type 2 diabetes mellitus and hypertension, as well as COVID-19 in September 2021, who presented to the emergency room with acute onset of altered mental status and not acting herself for the last 4 days with excessive sleepiness.She has been having nausea and dry heaves without abdominal pain. Her daughter stated that she has not eaten much and has been bed bound since September 2021 since her COVID-19 infection.    Adult failure to thrive/poor PO intake/generalized deconditioning Hypokalemia/depressed mood -- patient came in with ongoing weight loss and poor PO intake per daughter. Since  COVID infection in September 2021 per daughter patient has been going downhill. --She was admitted at [facility] with adult failure to thrive and poor PO intake. Patient also went to rehab in [place]. -- She recently was seen in [facility] emergency room on 10th of February she was discharged to go home with home health and daughter thinking patient will hopefully do better with her mood and eating. -- Daughter says patient has lost more than hundred pounds since September 2021. She has not ambulated and no interest in any acitivity. Stays in bed most of the time --she is requesting if patient can get a feeding tube -- dietitian consult-- for PEG/NG feeding -- speech therapy-- patient showed very little interest in participating with speech therapy. Pt has Dsyphagia and at risk for aspiration. --spoke with IR RN regarding PICC line and G-tube/peg tube placement  --discussed with daughter risk and complications of feeding tube, she voiced understanding patient's daughter will sign the consent. --3/1-- patient is tachycardic despite IV fluids and IV metoprolol 5 mg x1 dose soft blood pressure and continued encephalopathy. Will's transfer her to ICU --spoke with ICU NP Jeri Modena to follow pt.--pt will need Central line due to poor IV access. 3/2-TRH pickup.  3/4-Plan for IR for Peg placement today pending  3/5-s/p PEG tube placement on 3/4 3/6-Feeding initiated on 3/5 and tolerating F/u nutrition consult Plan for albumin infusion today 3/7-    Hypotension with sinus tachycardia, acute renal failure Hypokalemia hypothermic AKI improved with  ivf...>up mildly again at 1.35, possibly prerenal due to hypotension 3/4-she is anemic and still hypotensive  3/5-add midodrine Stool occult pending  S/p 1 unit prbc transfusion on 3/4 and 3/5 3/7-BP improving.  Will increase midodrine to 10 mg 3 times daily Continue albumin infusion per nephrology to complete 3 doses IV  fluid was discontinued on 3/6 Continue warming device    Atrial fib-rvr Was started on amiodarone infusion .  Cards following Now converted to NSR 3/4 heparin was discontinued on 3/3 due to drop in hemoglobin  3/7-Hemoccult stool positive. Will hold off on resuming anticoagulation at this time Cardiology following Holding blood pressure due to hypotension Continue p.o. amiodarone    Acute drop in H/H-initial hemoglobin this a.m. was 6.5 from 9.6.  Repeat is 7.4. Will hold Toradol, heparin drip Check stool occult to rule out GI bleed Transfuse if hg 7 or >. Discussed this with pt's daughter as we need consent, discussed complications of transfusion including transfusion reaction and even infection, she verbalized an understanding. 3/7 status post 2 units of transfusion, 3/4 and 3/5 Hemoccult stool positive GI was consulted Started on PPI IV twice daily 40 mg Hemoglobin stable at 9.3, will check in a.m. Per GI no evidence of active bleed. No indication for inpatient endoscopy evaluation urgently unless patient demonstrates signs of significant active GI bleed such as melena or rectal bleed or hematochezia.  Risk outweighs the benefit of endoscopy procedure at this time Recommended anemia work-up labs.  If presence of iron deficiency, endoscopy evaluation can be pursued Even if there is occult GI malignancy patient is not a candidate for surgery or chemo due to severe protein caloric malnutrition.,  Therefore recommended endoscopy evaluation once the patient recovers from failure to thrive and this can be pursued as outpatient We will continue tube feeding to meet nutritional goal  Switch IV PPI to p.o. 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 Neuro- recommends discussed with neurosurgery. --3/1-- per Dr Adriana Simas "This looks rather stable compared to CT from previous. This is likely benign, possible even cystic. She would need a contrasted image to delineate. No urgent intervention needed as we know it has been there for some time. Unrelated to current issues and wont cause encephalopathy  3/2-f/u neurosurgery as outpt to discuss options going forward once she recovers from acute illness. No intervention recommended at this time.    Abnormal ultrasound abdomen with gallbladder distention similar to previous imaging studies -- clinically patient does not appear to be septic or having any infection -- discontinue IV antibiotics-- no source of infection noted so far, chest x-ray negative for pneumonia. Appears to have some opacities from previous COVID infection.sats are more than 92% on room air 3/2-surgery Dr. Lady Gary - no surgical indication at present for gallbladder. Labs stable.     Depressed mood -- continue Cymbalta , remeron --  psych consult with Dr. Toni Amend.   -- patient has appointment for geriatric evaluation at [facility] per daughter in April 2022  Type II diabetes with sugars controlled -- sliding scale insulin --  hold PO diabetes meds  History of tachycardia/SVT -- patient follows with [facility]ology at [facility] -- recently started on Toprol-XL hundred milligrams daily. With soft blood pressure decreased dose to 25 mg daily..>discontinued now due to hypotension  Generalized deconditioning -- PT evaluation noted--rec SNF  Palliative following   DVT prophylaxis: scd.  No Lovenox due to GI bleed Code Status:full Family Communication: daugter updated  Status is:  Inpatient Patient is inpatient due to severity of her illness needing hospitalization , need IV treatments  Dispo: The patient is from: Home              Anticipated d/c is to: SNF              Patient currently is not medically stable to d/c.   Difficult to place patient No  Pt UO is low, anemic, getting, iv albumin, renal function worsening           LOS: 9 days   Time spent: 35 min with >50% on coc    Lynn Ito, MD [facility] Pager [phone]  If 7PM-7AM, please contact night-coverage 12/12/2020, 8:51 AM

## 2020-12-12 NOTE — Care Management Important Message (Signed)
Important Message  Patient Details  Name: Chelsea Glass MRN: [id number] Date of Birth: Oct 21, 1955   Medicare Important Message Given:  Yes     Johnell Comings 12/12/2020, 12:28 PM

## 2020-12-12 NOTE — Progress Notes (Signed)
Physical Therapy Treatment Patient Details Name: Chelsea Glass MRN: [id number] DOB: [birth year] Today's Date: 12/12/2020    History of Present Illness Pt is a 65 y/o F admitted from home on 12/03/20 with c/c of AMS, excessive sleepiness, nausea & dry heaves. Pt currently being treated for aspiration bibasal PNA & gallbladder sludge with associated nausea & dry heaves. PMH: OA, DM2, HTN, Covid 19 in September 2021    PT Comments    Patient is more alert and participatory this session that previous session. Patient continues to require total assistance for repositioning in bed. Bed placed in chair position to promote upright activity and increased participation. No leg pain reported today with bed in full chair position and head of bed greater than 60 degrees. Patient was able to participate in BLE and BUE exercise/ROM for strengthening. Patient reached across midline with BUE x 3 bouts each but unable to reach to bed rail to facilitate rolling/repositioning. Patient maintained midline trunk position with activity while head of bed elevated in chair position. No dizziness is reported with head of bed elevated in chair position. Patient continues to have profound weakness and would benefit from ongoing PT to maximize independence and decrease caregiver burden.     Follow Up Recommendations  SNF     Equipment Recommendations  None recommended by PT (daughter reportes hospital bed and hoyer lift at home)    Recommendations for Other Services       Precautions / Restrictions Precautions Precautions: Fall Restrictions Weight Bearing Restrictions: No    Mobility  Bed Mobility Overal bed mobility: Needs Assistance             General bed mobility comments: total assistance for repositioning in bed. placed the bed in chair position for upright activity and to monitor physiologic response to increased activity.    Transfers                    Ambulation/Gait                  Stairs             Wheelchair Mobility    Modified Rankin (Stroke Patients Only)       Balance                                            Cognition Arousal/Alertness: Awake/alert Behavior During Therapy: WFL for tasks assessed/performed Overall Cognitive Status: Impaired/Different from baseline                                 General Comments: patient more alert this session, verbalizing more and participating with therapy intervention compared to previous sessions. patient was able to follow single step commands most of the time with increased time      Exercises General Exercises - Lower Extremity Ankle Circles/Pumps: AAROM;Strengthening;Both;10 reps;Seated (bed in chair position, 2 sets of 10 reps) Quad Sets: AROM;Strengthening;Both;5 reps;Supine Gluteal Sets: AROM;Strengthening;Both;5 reps;Supine Short Arc Quad: AAROM;Strengthening;Both;10 reps;Seated (performe x 2 sets with bed in chair position) Hip ABduction/ADduction: AAROM;Strengthening;Both;10 reps;Seated (bed in chair position) Other Exercises Other Exercises: performed gentle AAROM of BUE including wrist extension/flexion, elbow flexion/extension, and shoulder flexion to ~60 degrees. verbal and visual cues for technique    General Comments        Pertinent Vitals/Pain  Pain Assessment: Faces Faces Pain Scale: Hurts a little bit Pain Location: left elbow (old abrasion noted) Pain Descriptors / Indicators: Sore Pain Intervention(s): Monitored during session (no increased pain with ROM and activity)    Home Living                      Prior Function            PT Goals (current goals can now be found in the care plan section) Acute Rehab PT Goals Patient Stated Goal: none stated PT Goal Formulation: With family (daughter wants to bring patient home at discharge with HHPT) Time For Goal Achievement: 12/18/20 Progress towards PT goals: Progressing  toward goals    Frequency    Min 2X/week      PT Plan Current plan remains appropriate    Co-evaluation              AM-PAC PT "6 Clicks" Mobility   Outcome Measure  Help needed turning from your back to your side while in a flat bed without using bedrails?: Total Help needed moving from lying on your back to sitting on the side of a flat bed without using bedrails?: Total Help needed moving to and from a bed to a chair (including a wheelchair)?: Total Help needed standing up from a chair using your arms (e.g., wheelchair or bedside chair)?: Total Help needed to walk in hospital room?: Total Help needed climbing 3-5 steps with a railing? : Total 6 Click Score: 6    End of Session   Activity Tolerance: Patient tolerated treatment well Patient left: in bed;with call bell/phone within reach;with bed alarm set;with family/visitor present (daughter at the bedside) Nurse Communication: Mobility status PT Visit Diagnosis: Muscle weakness (generalized) (M62.81);Difficulty in walking, not elsewhere classified (R26.2)     Time: 1884-1660 PT Time Calculation (min) (ACUTE ONLY): 30 min  Charges:  $Therapeutic Exercise: 8-22 mins $Therapeutic Activity: 8-22 mins                     Donna Bernard, PT, MPT    Ina Homes 12/12/2020, 2:48 PM

## 2020-12-12 NOTE — Progress Notes (Signed)
Arlyss Repressohini R Vanga, MD [address]  [address]  [place], [place] [address]  Main: [phone]  Fax: [phone] Pager: [phone]   Subjective: No acute events overnight.  Patient received blood transfusion and her hemoglobin responded appropriately.  Her hemoglobin is 9.3 today he is 9.3 today.  Patient is having bowel movements   Objective: Vital signs in last 24 hours: Vitals:   12/12/20 0500 12/12/20 0813 12/12/20 1155 12/12/20 1527  BP:  (!) 90/56 (!) 90/54 115/65  Pulse:  97 93 96  Resp:  16 16 16   Temp:  98.6 F (37 C) 98.4 F (36.9 C) 98.2 F (36.8 C)  TempSrc:  Oral Oral Oral  SpO2:  100% 100% 100%  Weight: 126.4 kg     Height:       Weight change: 6.214 kg  Intake/Output Summary (Last 24 hours) at 12/12/2020 1700 Last data filed at 12/12/2020 1617 Gross per 24 hour  Intake 200 ml  Output 600 ml  Net -400 ml     Exam: Heart:: Regular rate and rhythm, S1S2 present or without murmur or extra heart sounds Lungs: normal and clear to auscultation Abdomen: soft, nontender, normal bowel sounds, G-tube in place   Lab Results: CBC Latest Ref Rng & Units 12/11/2020 12/10/2020 12/09/2020  WBC 4.0 - 10.5 K/uL 11.2(H) - -  Hemoglobin 12.0 - 15.0 g/dL 9.6(E9.3(L) 7.1(L) 6.3(L)  Hematocrit 36.0 - 46.0 % 26.8(L) 20.4(L) 19.1(L)  Platelets 150 - 400 K/uL 130(L) - -   CMP Latest Ref Rng & Units 12/12/2020 12/11/2020 12/10/2020  Glucose 70 - 99 mg/dL 952(W101(H) 413(K183(H) -  BUN 8 - 23 mg/dL 20 18 -  Creatinine 4.400.44 - 1.00 mg/dL 1.02(V1.61(H) 2.53(G1.52(H) -  Sodium 135 - 145 mmol/L 137 136 -  Potassium 3.5 - 5.1 mmol/L 4.2 3.9 4.0  Chloride 98 - 111 mmol/L 111 111 -  CO2 22 - 32 mmol/L 19(L) 18(L) -  Calcium 8.9 - 10.3 mg/dL 6.4(Q8.8(L) 0.3(K8.4(L) -  Total Protein 6.5 - 8.1 g/dL - - -  Total Bilirubin 0.3 - 1.2 mg/dL - - -  Alkaline Phos 38 - 126 U/L - - -  AST 15 - 41 U/L - - -  ALT 0 - 44 U/L - - -    Micro Results: Recent Results (from the past 240 hour(s))  Resp Panel by RT-PCR (Flu A&B,  Covid) Nasopharyngeal Swab     Status: None   Collection Time: 12/03/20  1:35 PM   Specimen: Nasopharyngeal Swab; Nasopharyngeal(NP) swabs in vial transport medium  Result Value Ref Range Status   SARS Coronavirus 2 by RT PCR NEGATIVE NEGATIVE Final    Comment: (NOTE) SARS-CoV-2 target nucleic acids are NOT DETECTED.  The SARS-CoV-2 RNA is generally detectable in upper respiratory specimens during the acute phase of infection. The lowest concentration of SARS-CoV-2 viral copies this assay can detect is 138 copies/mL. A negative result does not preclude SARS-Cov-2 infection and should not be used as the sole basis for treatment or other patient management decisions. A negative result may occur with  improper specimen collection/handling, submission of specimen other than nasopharyngeal swab, presence of viral mutation(s) within the areas targeted by this assay, and inadequate number of viral copies(<138 copies/mL). A negative result must be combined with clinical observations, patient history, and epidemiological information. The expected result is Negative.  Fact Sheet for Patients:  BloggerCourse.comhttps://www.fda.gov/media/152166/download  Fact Sheet for Healthcare Providers:  SeriousBroker.ithttps://www.fda.gov/media/152162/download  This test is no t yet approved or cleared  by the [place] and  has been authorized for detection and/or diagnosis of SARS-CoV-2 by FDA under an Emergency Use Authorization (EUA). This EUA will remain  in effect (meaning this test can be used) for the duration of the COVID-19 declaration under Section 564(b)(1) of the Act, 21 U.S.C.section 360bbb-3(b)(1), unless the authorization is terminated  or revoked sooner.       Influenza A by PCR NEGATIVE NEGATIVE Final   Influenza B by PCR NEGATIVE NEGATIVE Final    Comment: (NOTE) The Xpert Xpress SARS-CoV-2/FLU/RSV plus assay is intended as an aid in the diagnosis of influenza from Nasopharyngeal swab specimens and should  not be used as a sole basis for treatment. Nasal washings and aspirates are unacceptable for Xpert Xpress SARS-CoV-2/FLU/RSV testing.  Fact Sheet for Patients: BloggerCourse.com  Fact Sheet for Healthcare Providers: SeriousBroker.it  This test is not yet approved or cleared by the [place] FDA and has been authorized for detection and/or diagnosis of SARS-CoV-2 by FDA under an Emergency Use Authorization (EUA). This EUA will remain in effect (meaning this test can be used) for the duration of the COVID-19 declaration under Section 564(b)(1) of the Act, 21 U.S.C. section 360bbb-3(b)(1), unless the authorization is terminated or revoked.  Performed at [facility], [address] [place], [place] [address]   CULTURE, BLOOD (ROUTINE X 2) w Reflex to ID Panel     Status: None   Collection Time: 12/06/20 10:55 AM   Specimen: BLOOD  Result Value Ref Range Status   Specimen Description BLOOD LEFT WRIST  Final   Special Requests   Final    BOTTLES DRAWN AEROBIC ONLY Blood Culture adequate volume   Culture   Final    NO GROWTH 5 DAYS Performed at [facility], [address] [place], [place] [address]    Report Status 12/11/2020 FINAL  Final  CULTURE, BLOOD (ROUTINE X 2) w Reflex to ID Panel     Status: None   Collection Time: 12/06/20 11:05 AM   Specimen: BLOOD  Result Value Ref Range Status   Specimen Description BLOOD RIGHT WRIST  Final   Special Requests   Final    BOTTLES DRAWN AEROBIC ONLY Blood Culture results may not be optimal due to an inadequate volume of blood received in culture bottles   Culture   Final    NO GROWTH 5 DAYS Performed at [facility], [address] [place], [place] [address]    Report Status 12/11/2020 FINAL  Final  MRSA PCR Screening     Status: None   Collection Time: 12/06/20  1:18 PM   Specimen: Nasal Mucosa; Nasopharyngeal  Result Value Ref Range Status    MRSA by PCR NEGATIVE NEGATIVE Final    Comment:        The GeneXpert MRSA Assay (FDA approved for NASAL specimens only), is one component of a comprehensive MRSA colonization surveillance program. It is not intended to diagnose MRSA infection nor to guide or monitor treatment for MRSA infections. Performed at [facility], [address] [place], [place] [address]   Urine Culture     Status: None   Collection Time: 12/06/20  1:18 PM   Specimen: Urine, Random  Result Value Ref Range Status   Specimen Description   Final    URINE, RANDOM Performed at [facility], [address] [place], [place] [address]    Special Requests   Final    NONE Performed at [facility],  [address], [place], [place] [address]    Culture   Final    NO GROWTH Performed at [facility], [address] [place], [place] [address]    Report Status 12/08/2020 FINAL  Final   Studies/Results: No results found. Medications:  I have reviewed the patient's current medications. Prior to Admission:  Medications Prior to Admission  Medication Sig Dispense Refill Last Dose  . DULoxetine (CYMBALTA) 30 MG capsule Take 30 mg by mouth daily.   24+ hours at Unknown  . metoprolol succinate (TOPROL-XL) 100 MG 24 hr tablet Take 100 mg by mouth daily.   24+ hours at Unknown  . mirtazapine (REMERON) 15 MG tablet Take 15 mg by mouth at bedtime.   24+ hours at Unknown   Scheduled: . sodium chloride   Intravenous Once  . sodium chloride   Intravenous Once  . amiodarone  200 mg Per Tube BID  . vitamin C  250 mg Per Tube BID  . Chlorhexidine Gluconate Cloth  6 each Topical Daily  . diclofenac Sodium  2 g Topical QID  . DULoxetine  30 mg Oral Daily  . feeding supplement (OSMOLITE 1.5 CAL)  237 mL Per Tube 6 X Daily  . feeding supplement (PROSource TF)  45 mL Per Tube BID  . free water  100 mL Per Tube 6 X Daily  . insulin aspart  0-9 Units Subcutaneous Q4H  . lidocaine  1  patch Transdermal Q24H  . midodrine  5 mg Per Tube TID WC  . mirtazapine  45 mg Oral QHS  . OLANZapine zydis  5 mg Oral QHS  . pantoprazole (PROTONIX) IV  40 mg Intravenous Q12H   Continuous: . sodium chloride Stopped (12/10/20 2201)  . albumin human 25 g (12/12/20 1326)   BLT:JQZESP chloride, acetaminophen **OR** acetaminophen, fentaNYL (SUBLIMAZE) injection, magnesium hydroxide, ondansetron **OR** ondansetron (ZOFRAN) IV Anti-infectives (From admission, onward)   Start     Dose/Rate Route Frequency Ordered Stop   12/09/20 1300  ceFAZolin (ANCEF) IVPB 2g/100 mL premix  Status:  Discontinued        2 g 200 mL/hr over 30 Minutes Intravenous  Once 12/09/20 1203 12/12/20 0821   12/09/20 1205  ceFAZolin (ANCEF) IVPB 1 g/50 mL premix        over 30 Minutes Intravenous Continuous PRN 12/09/20 1205 12/09/20 1205   12/04/20 0000  piperacillin-tazobactam (ZOSYN) IVPB 3.375 g  Status:  Discontinued        3.375 g 100 mL/hr over 30 Minutes Intravenous Every 6 hours 12/03/20 2041 12/03/20 2050   12/03/20 2200  azithromycin (ZITHROMAX) 500 mg in sodium chloride 0.9 % 250 mL IVPB  Status:  Discontinued        500 mg 250 mL/hr over 60 Minutes Intravenous Every 24 hours 12/03/20 2041 12/04/20 0724   12/03/20 2200  piperacillin-tazobactam (ZOSYN) IVPB 3.375 g  Status:  Discontinued        3.375 g 12.5 mL/hr over 240 Minutes Intravenous Every 8 hours 12/03/20 2050 12/04/20 1506   12/03/20 1900  ampicillin-sulbactam (UNASYN) 1.5 g in sodium chloride 0.9 % 100 mL IVPB        1.5 g 200 mL/hr over 30 Minutes Intravenous  Once 12/03/20 1850 12/03/20 2350     Scheduled Meds: . sodium chloride   Intravenous Once  . sodium chloride   Intravenous Once  . amiodarone  200 mg Per Tube BID  . vitamin C  250 mg Per Tube BID  .  Chlorhexidine Gluconate Cloth  6 each Topical Daily  . diclofenac Sodium  2 g Topical QID  . DULoxetine  30 mg Oral Daily  . feeding supplement (OSMOLITE 1.5 CAL)  237 mL Per Tube 6 X  Daily  . feeding supplement (PROSource TF)  45 mL Per Tube BID  . free water  100 mL Per Tube 6 X Daily  . insulin aspart  0-9 Units Subcutaneous Q4H  . lidocaine  1 patch Transdermal Q24H  . midodrine  5 mg Per Tube TID WC  . mirtazapine  45 mg Oral QHS  . OLANZapine zydis  5 mg Oral QHS  . pantoprazole (PROTONIX) IV  40 mg Intravenous Q12H   Continuous Infusions: . sodium chloride Stopped (12/10/20 2201)  . albumin human 25 g (12/12/20 1326)   PRN Meds:.sodium chloride, acetaminophen **OR** acetaminophen, fentaNYL (SUBLIMAZE) injection, magnesium hydroxide, ondansetron **OR** ondansetron (ZOFRAN) IV   Assessment: Principal Problem:   Depression Active Problems:   AKI (acute kidney injury) (HCC)   Adult failure to thrive   Hypokalemia   Weight loss   Altered mental status   Tachycardia   Hypotension   Plan: FOBT positive: No evidence of active GI bleed Patient responded appropriately to blood transfusion.  FOBT positivity in inpatient setting has no clinical utility.  A positive or negative test would not change the recommendation for inpatient endoscopic evaluation There is no indication for urgent endoscopic evaluation at this time unless patient demonstrates signs of significantly active GI bleed such as melena or rectal bleeding or hematochezia.  Risks overweigh the benefits of endoscopy procedures at this time Recommend anemia work-up, labs ordered.  If there is presence of iron deficiency, endoscopic evaluation can be pursued Even if there is occult GI malignancy, patient will not be a candidate for surgery or chemotherapy  due to severe protein calorie malnutrition.  Therefore, I recommend endoscopic evaluation once the patient recovers from failure to thrive, and this can be pursued as outpatient Continue tube feeds to meet the nutrition goal Okay to continue Protonix 40 mg twice daily via G-tube   LOS: 9 days   Rohini Vanga 12/12/2020, 5:00 PM

## 2020-12-12 NOTE — Progress Notes (Signed)
Nutrition Follow Up Note   DOCUMENTATION CODES:   Obesity unspecified  INTERVENTION:   Resume Osmolite 1.5- 6 cans daily. Flush with 110m of water before and after each feed.   Pro-Source 426mBID via tube, provides 40kcal and 11g of protein per serving   Regimen provides 2210kcal/day, 112g/day protein and 168622may free water   Recommend vitamin C 250m22mD via tube  NUTRITION DIAGNOSIS:   Inadequate oral intake related to lethargy/confusion as evidenced by NPO status. -ongoing  GOAL:   Patient will meet greater than or equal to 90% of their needs  -met with tube feeds   MONITOR:   Diet advancement,Labs,Weight trends,TF tolerance,Skin,I & O's  ASSESSMENT:   64 y54. African-American female with medical history significant for osteoarthritis, type 2 diabetes mellitus, hypertension and COVID-19 in September 2021 who is admitted with failure to thrive  Pt s/p IR G-tube 3/4  Pt tolerating tube feeds well at goal rate. Pt is refeeding; electrolytes being supplemented. Per chart, pt with weight gain since admit.   Medications reviewed and include: vitamin C, insulin, remeron, protonix, KPhos, albumin  Labs reviewed: K 4.2 wnl, creat 1.61(H), P 2.3(L), Mg 2.1 wnl Wbc- 11.2(H), Hgb 9.3(L), Hct 26.8(L) cbgs- 119, 102, 107 x 24 hrs  Diet Order:   Diet Order            Diet NPO time specified  Diet effective now                EDUCATION NEEDS:   No education needs have been identified at this time  Skin:  Skin Assessment: Reviewed RN Assessment (ecchymosis)  Last BM:  3/6- type 7  Height:   Ht Readings from Last 1 Encounters:  12/03/20 _0  (1.651 m)    Weight:   Wt Readings from Last 1 Encounters:  12/12/20 126.4 kg    Ideal Body Weight:  56.8 kg  BMI:  Body mass index is 46.38 kg/m.  Estimated Nutritional Needs:   Kcal:  2000-2300kcal/day  Protein:  100-115g/day  Fluid:  1.7L-2.0L/day  CaseKoleen Distance RD, LDN Please [facility]  RD and/or RD on-call/weekend/after hours pager

## 2020-12-12 NOTE — Progress Notes (Signed)
PHARMACY CONSULT NOTE  Pharmacy Consult for Electrolyte Monitoring and Replacement   Recent Labs: Potassium (mmol/L)  Date Value  12/12/2020 4.2   Magnesium (mg/dL)  Date Value  03/83/3383 2.1   Calcium (mg/dL)  Date Value  29/19/1660 8.8 (L)   Albumin (g/dL)  Date Value  60/01/5996 1.6 (L)   Phosphorus (mg/dL)  Date Value  74/14/2395 2.3 (L)   Sodium (mmol/L)  Date Value  12/12/2020 137   Assessment: 65 year old female with generalized deconditioning and failure to thrive. Dysphagia limiting safe PO intake, plan for G-tube placement. Pharmacy consult for electrolyte replacement.  Expect patient will be at risk for re-feeding when enteral access is established and feeds are started  Goal of Therapy:  K > 4 Mg > 2 All other electrolytes within normal limits  Plan:  Will order Na&K Phos packets x 2.  --Will follow-up electrolytes with AM labs   Paschal Dopp, PharmD,  Clinical Pharmacist 12/12/2020

## 2020-12-12 NOTE — Progress Notes (Signed)
[facility]  ROUNDING NOTE   Subjective:   More awake and answering questions.   UOP Getting D5LR infusion   Hemoglobin 9.3 - status post PRBC transfusion  Objective:  Vital signs in last 24 hours:  Temp:  [98.2 F (36.8 C)-99 F (37.2 C)] 98.6 F (37 C) (03/07 0813) Pulse Rate:  [88-100] 97 (03/07 0813) Resp:  [16-19] 16 (03/07 0813) BP: (83-99)/(45-57) 90/56 (03/07 0813) SpO2:  [97 %-100 %] 100 % (03/07 0813) Weight:  [126.4 kg] 126.4 kg (03/07 0500)  Weight change: 6.214 kg Filed Weights   12/09/20 0045 12/11/20 0119 12/12/20 0500  Weight: 112 kg 120.2 kg 126.4 kg    Intake/Output: I/O last 3 completed shifts: In: 692.5 [I.V.:62.5; Blood:430; IV Piggyback:200] Out: 250 [Urine:250]   Intake/Output this shift:  No intake/output data recorded.  Physical Exam: General: NAD, laying in bed  Head: Normocephalic, atraumatic. Moist oral mucosal membranes  Eyes: Anicteric, PERRL  Neck: Supple, trachea midline  Lungs:  Clear to auscultation  Heart: Regular rate and rhythm  Abdomen:  +G-tube  Extremities:  + peripheral edema.  Neurologic: Able to answer simple yes and no questions  Skin: No lesions  GU: Foley with urine    Basic Metabolic Panel: Recent Labs  Lab 12/08/20 0501 12/09/20 0615 12/10/20 0806 12/10/20 1608 12/11/20 0425 12/12/20 0450  NA 141 140 138  --  136 137  K 3.3* 3.6 2.9* 4.0 3.9 4.2  CL 115* 114* 114*  --  111 111  CO2 19* 18* 16*  --  18* 19*  GLUCOSE 191* 157* 147*  --  183* 101*  BUN 19 17 16   --  18 20  CREATININE 1.35* 1.29* 1.32*  --  1.52* 1.61*  CALCIUM 8.5* 8.3* 7.8*  --  8.4* 8.8*  MG 1.6* 1.8 1.7  --  2.0 2.1  PHOS 2.0* 2.4* 2.7  --  2.8 2.3*    Liver Function Tests: Recent Labs  Lab 12/06/20 1647 12/07/20 0437 12/08/20 0501 12/09/20 0615 12/10/20 0806 12/11/20 0425  AST 23 25 27  33  --   --   ALT 34 34 35 41  --   --   ALKPHOS 239* 250* 195* 180*  --   --   BILITOT 1.0 0.5 0.6 0.7  --   --    PROT 4.9* 4.9* 4.3* 4.2*  --   --   ALBUMIN 2.0* 2.0* 1.8* 1.8* 1.5* 1.6*   No results for input(s): LIPASE, AMYLASE in the last 168 hours. No results for input(s): AMMONIA in the last 168 hours.  CBC: Recent Labs  Lab 12/06/20 0754 12/07/20 0730 12/08/20 0810 12/08/20 0930 12/09/20 0615 12/09/20 0805 12/10/20 0806 12/11/20 0425  WBC 9.4 10.8* 13.3*  --  13.0*  --   --  11.2*  HGB 10.8* 9.6* 6.5* 7.4* 6.4* 6.3* 7.1* 9.3*  HCT 30.8* 28.0* 19.1* 21.9* 18.6* 19.1* 20.4* 26.8*  MCV 82.4 84.1 83.8  --  84.5  --   --  83.2  PLT 269 247 214  --  210  --   --  130*    Cardiac Enzymes: No results for input(s): CKTOTAL, CKMB, CKMBINDEX, TROPONINI in the last 168 hours.  BNP: Invalid input(s): POCBNP  CBG: Recent Labs  Lab 12/11/20 1631 12/11/20 2049 12/12/20 0013 12/12/20 0355 12/12/20 0811  GLUCAP 134* 132* 119* 102* 107*    Microbiology: Results for orders placed or performed during the hospital encounter of 12/03/20  Resp Panel by RT-PCR (  Flu A&B, Covid) Nasopharyngeal Swab     Status: None   Collection Time: 12/03/20  1:35 PM   Specimen: Nasopharyngeal Swab; Nasopharyngeal(NP) swabs in vial transport medium  Result Value Ref Range Status   SARS Coronavirus 2 by RT PCR NEGATIVE NEGATIVE Final    Comment: (NOTE) SARS-CoV-2 target nucleic acids are NOT DETECTED.  The SARS-CoV-2 RNA is generally detectable in upper respiratory specimens during the acute phase of infection. The lowest concentration of SARS-CoV-2 viral copies this assay can detect is 138 copies/mL. A negative result does not preclude SARS-Cov-2 infection and should not be used as the sole basis for treatment or other patient management decisions. A negative result may occur with  improper specimen collection/handling, submission of specimen other than nasopharyngeal swab, presence of viral mutation(s) within the areas targeted by this assay, and inadequate number of viral copies(<138 copies/mL). A  negative result must be combined with clinical observations, patient history, and epidemiological information. The expected result is Negative.  Fact Sheet for Patients:  BloggerCourse.com  Fact Sheet for Healthcare Providers:  SeriousBroker.it  This test is no t yet approved or cleared by the [place] FDA and  has been authorized for detection and/or diagnosis of SARS-CoV-2 by FDA under an Emergency Use Authorization (EUA). This EUA will remain  in effect (meaning this test can be used) for the duration of the COVID-19 declaration under Section 564(b)(1) of the Act, 21 U.S.C.section 360bbb-3(b)(1), unless the authorization is terminated  or revoked sooner.       Influenza A by PCR NEGATIVE NEGATIVE Final   Influenza B by PCR NEGATIVE NEGATIVE Final    Comment: (NOTE) The Xpert Xpress SARS-CoV-2/FLU/RSV plus assay is intended as an aid in the diagnosis of influenza from Nasopharyngeal swab specimens and should not be used as a sole basis for treatment. Nasal washings and aspirates are unacceptable for Xpert Xpress SARS-CoV-2/FLU/RSV testing.  Fact Sheet for Patients: BloggerCourse.com  Fact Sheet for Healthcare Providers: SeriousBroker.it  This test is not yet approved or cleared by the [place] FDA and has been authorized for detection and/or diagnosis of SARS-CoV-2 by FDA under an Emergency Use Authorization (EUA). This EUA will remain in effect (meaning this test can be used) for the duration of the COVID-19 declaration under Section 564(b)(1) of the Act, 21 U.S.C. section 360bbb-3(b)(1), unless the authorization is terminated or revoked.  Performed at [facility], [address] [place], [place] [address]   CULTURE, BLOOD (ROUTINE X 2) w Reflex to ID Panel     Status: None   Collection Time: 12/06/20 10:55 AM   Specimen: BLOOD  Result Value Ref  Range Status   Specimen Description BLOOD LEFT WRIST  Final   Special Requests   Final    BOTTLES DRAWN AEROBIC ONLY Blood Culture adequate volume   Culture   Final    NO GROWTH 5 DAYS Performed at [facility], [address] [place], [place] [address]    Report Status 12/11/2020 FINAL  Final  CULTURE, BLOOD (ROUTINE X 2) w Reflex to ID Panel     Status: None   Collection Time: 12/06/20 11:05 AM   Specimen: BLOOD  Result Value Ref Range Status   Specimen Description BLOOD RIGHT WRIST  Final   Special Requests   Final    BOTTLES DRAWN AEROBIC ONLY Blood Culture results may not be optimal due to an inadequate volume of blood received in culture bottles   Culture   Final  NO GROWTH 5 DAYS Performed at [facility], [address] [place], [place] [address]    Report Status 12/11/2020 FINAL  Final  MRSA PCR Screening     Status: None   Collection Time: 12/06/20  1:18 PM   Specimen: Nasal Mucosa; Nasopharyngeal  Result Value Ref Range Status   MRSA by PCR NEGATIVE NEGATIVE Final    Comment:        The GeneXpert MRSA Assay (FDA approved for NASAL specimens only), is one component of a comprehensive MRSA colonization surveillance program. It is not intended to diagnose MRSA infection nor to guide or monitor treatment for MRSA infections. Performed at [facility], [address] [place], [place] [address]   Urine Culture     Status: None   Collection Time: 12/06/20  1:18 PM   Specimen: Urine, Random  Result Value Ref Range Status   Specimen Description   Final    URINE, RANDOM Performed at [facility], [address] [place], [place] [address]    Special Requests   Final    NONE Performed at [facility], [address] [place], [place] [address]    Culture   Final    NO GROWTH Performed at [facility], [address] [place], [place] [address]    Report Status 12/08/2020 FINAL  Final    Coagulation  Studies: No results for input(s): LABPROT, INR in the last 72 hours.  Urinalysis: No results for input(s): COLORURINE, LABSPEC, PHURINE, GLUCOSEU, HGBUR, BILIRUBINUR, KETONESUR, PROTEINUR, UROBILINOGEN, NITRITE, LEUKOCYTESUR in the last 72 hours.  Invalid input(s): APPERANCEUR    Imaging: No results found.   Medications:   . sodium chloride Stopped (12/10/20 2201)  . albumin human 25 g (12/12/20 0454)   . sodium chloride   Intravenous Once  . sodium chloride   Intravenous Once  . amiodarone  200 mg Per Tube BID  . vitamin C  250 mg Per Tube BID  . Chlorhexidine Gluconate Cloth  6 each Topical Daily  . diclofenac Sodium  2 g Topical QID  . DULoxetine  30 mg Oral Daily  . feeding supplement (OSMOLITE 1.5 CAL)  237 mL Per Tube 6 X Daily  . feeding supplement (PROSource TF)  45 mL Per Tube BID  . free water  100 mL Per Tube 6 X Daily  . insulin aspart  0-9 Units Subcutaneous Q4H  . lidocaine  1 patch Transdermal Q24H  . midodrine  5 mg Per Tube TID WC  . mirtazapine  45 mg Oral QHS  . OLANZapine zydis  5 mg Oral QHS  . pantoprazole (PROTONIX) IV  40 mg Intravenous Q12H  . potassium & sodium phosphates  2 packet Per Tube Q4H   sodium chloride, acetaminophen **OR** acetaminophen, fentaNYL (SUBLIMAZE) injection, magnesium hydroxide, ondansetron **OR** ondansetron (ZOFRAN) IV  Assessment/ Plan:  Chelsea Glass is a 65 y.o. black female with diabetes mellitus type II, hypertension, osteoarthritis, hyperlipidemia who was admitted to [facility] on 12/03/2020 for AKI (acute kidney injury) (HCC) [N17.9] Altered mental status, unspecified altered mental status type [R41.82]  Found to have aspiration pneumonia  1. Acute kidney injury: baseline creatinine of 0.95 with normal GFR >60 on 12/10/13/2019.  Urinalysis with proteinuria and hematuria.  History of diabetic nephropathy.  - IV albumin - Low threshold for IV diuretics.   2. Hypotension: Holding home metoprolol dose.  -  midodrine  3. Hypokalemia: improved with IV fluids.   4. Metabolic acidosis:  -  may require diuretics for contration  5. Anemia with renal failure: PRBC transfusion 3/4 and 3/6   LOS: 9 Joannie Medine 3/7/202211:30 AM

## 2020-12-12 NOTE — Progress Notes (Signed)
Daily Progress Note   Patient Name: Chelsea Glass       Date: 12/12/2020 DOB: October 23, 1955  Age: 65 y.o. MRN#: [id number] Attending Physician: Lynn Ito, MD Primary Care Physician: [facility], [facility] Admit Date: 12/03/2020  Reason for Consultation/Follow-up: Establishing goals of care  Subjective: Notes reviewed, and per staff patient is currently receiving bolus tube feeds and is handling them well. Patient is resting in bed. She denies pain. Her voice is soft, but she does speak today, and indicates she would like to continue current care, and hopes for D/C home when ready D/C.   Length of Stay: 9  Current Medications: Scheduled Meds:   sodium chloride   Intravenous Once   sodium chloride   Intravenous Once   amiodarone  200 mg Per Tube BID   vitamin C  250 mg Per Tube BID   Chlorhexidine Gluconate Cloth  6 each Topical Daily   diclofenac Sodium  2 g Topical QID   DULoxetine  30 mg Oral Daily   feeding supplement (OSMOLITE 1.5 CAL)  237 mL Per Tube 6 X Daily   feeding supplement (PROSource TF)  45 mL Per Tube BID   free water  100 mL Per Tube 6 X Daily   insulin aspart  0-9 Units Subcutaneous Q4H   lidocaine  1 patch Transdermal Q24H   midodrine  5 mg Per Tube TID WC   mirtazapine  45 mg Oral QHS   OLANZapine zydis  5 mg Oral QHS   pantoprazole (PROTONIX) IV  40 mg Intravenous Q12H   potassium & sodium phosphates  2 packet Per Tube Q4H    Continuous Infusions:  sodium chloride Stopped (12/10/20 2201)   albumin human 25 g (12/12/20 0454)    PRN Meds: sodium chloride, acetaminophen **OR** acetaminophen, fentaNYL (SUBLIMAZE) injection, magnesium hydroxide, ondansetron **OR** ondansetron (ZOFRAN) IV  Physical Exam Pulmonary:     Effort:  Pulmonary effort is normal.  Neurological:     Mental Status: She is alert.             Vital Signs: BP (!) 90/54 (BP Location: Left Arm)    Pulse 93    Temp 98.4 F (36.9 C) (Oral)    Resp 16    Ht 5\' 5"  (1.651 m)    Wt 126.4 kg  SpO2 100%    BMI 46.38 kg/m  SpO2: SpO2: 100 % O2 Device: O2 Device: Room Air O2 Flow Rate: O2 Flow Rate (L/min): 3 L/min  Intake/output summary:   Intake/Output Summary (Last 24 hours) at 12/12/2020 1224 Last data filed at 12/12/2020 0454 Gross per 24 hour  Intake 262.51 ml  Output 150 ml  Net 112.51 ml   LBM: Last BM Date: 12/11/20 Baseline Weight: Weight: 95.8 kg Most recent weight: Weight: 126.4 kg         Flowsheet Rows   Flowsheet Row Most Recent Value  Intake Tab   Referral Department Hospitalist  Unit at Time of Referral Med/Surg Unit  Palliative Care Primary Diagnosis Other (Comment)  Date Notified 12/04/20  Palliative Care Type New Palliative care  Reason for referral Clarify Goals of Care  Date of Admission 12/03/20  Date first seen by Palliative Care 12/06/20  # of days Palliative referral response time 2 Day(s)  # of days IP prior to Palliative referral 1  Clinical Assessment   Psychosocial & Spiritual Assessment   Palliative Care Outcomes       Patient Active Problem List   Diagnosis Date Noted   Altered mental status    Tachycardia    Hypotension    Weight loss    Adult failure to thrive    Hypokalemia    Depression    AKI (acute kidney injury) (HCC) 12/03/2020   Postmenopausal bleeding    Hypertension    Acute respiratory failure with hypoxia (HCC) 06/13/2020   Obesity, Class III, BMI 40-49.9 (morbid obesity) (HCC) 06/13/2020   Severe sepsis (HCC) 06/13/2020   New onset atrial fibrillation (HCC) 06/13/2020   Pneumonia due to COVID-19 virus 06/12/2020    Palliative Care Assessment & Plan    Recommendations/Plan: Recommend palliative at D/C.     Code Status:    Code Status Orders   (From admission, onward)         Start     Ordered   12/09/20 1207  Full code  Continuous        12/09/20 1206        Code Status History    Date Active Date Inactive Code Status Order ID Comments User Context   12/03/2020 2042 12/09/2020 1206 Full Code [id number]  [place] [facility] Vernetta Honey, MD Inpatient   06/12/2020 1804 06/27/2020 2002 Full Code [id number]  Lurene Shadow, MD ED   Advance Care Planning Activity      Prognosis:  Unable to determine    Care plan was discussed with primary RN.   Thank you for allowing the Palliative Medicine Team to assist in the care of this patient.   Total Time 15 min Prolonged Time Billed no      Greater than 50%  of this time was spent counseling and coordinating care related to the above assessment and plan.  Morton Stall, NP  Please contact Palliative Medicine Team phone at [phone] for questions and concerns.

## 2020-12-13 ENCOUNTER — Inpatient Hospital Stay: Payer: Medicare Other

## 2020-12-13 DIAGNOSIS — R400 Somnolence: Secondary | ICD-10-CM | POA: Diagnosis not present

## 2020-12-13 DIAGNOSIS — N179 Acute kidney failure, unspecified: Secondary | ICD-10-CM | POA: Diagnosis not present

## 2020-12-13 DIAGNOSIS — R29898 Other symptoms and signs involving the musculoskeletal system: Secondary | ICD-10-CM

## 2020-12-13 DIAGNOSIS — R627 Adult failure to thrive: Secondary | ICD-10-CM | POA: Diagnosis not present

## 2020-12-13 DIAGNOSIS — F32A Depression, unspecified: Secondary | ICD-10-CM | POA: Diagnosis not present

## 2020-12-13 LAB — BASIC METABOLIC PANEL
Anion gap: 6 (ref 5–15)
BUN: 24 mg/dL — ABNORMAL HIGH (ref 8–23)
CO2: 20 mmol/L — ABNORMAL LOW (ref 22–32)
Calcium: 9.1 mg/dL (ref 8.9–10.3)
Chloride: 110 mmol/L (ref 98–111)
Creatinine, Ser: 1.77 mg/dL — ABNORMAL HIGH (ref 0.44–1.00)
GFR, Estimated: 31 mL/min — ABNORMAL LOW (ref >60)
Glucose, Bld: 145 mg/dL — ABNORMAL HIGH (ref 70–99)
Potassium: 4.3 mmol/L (ref 3.5–5.1)
Sodium: 136 mmol/L (ref 135–145)

## 2020-12-13 LAB — GLUCOSE, CAPILLARY
Glucose-Capillary: 110 mg/dL — ABNORMAL HIGH (ref 70–99)
Glucose-Capillary: 140 mg/dL — ABNORMAL HIGH (ref 70–99)
Glucose-Capillary: 143 mg/dL — ABNORMAL HIGH (ref 70–99)
Glucose-Capillary: 149 mg/dL — ABNORMAL HIGH (ref 70–99)
Glucose-Capillary: 154 mg/dL — ABNORMAL HIGH (ref 70–99)
Glucose-Capillary: 159 mg/dL — ABNORMAL HIGH (ref 70–99)
Glucose-Capillary: 171 mg/dL — ABNORMAL HIGH (ref 70–99)

## 2020-12-13 LAB — CBC
HCT: 23.5 % — ABNORMAL LOW (ref 36.0–46.0)
Hemoglobin: 8.1 g/dL — ABNORMAL LOW (ref 12.0–15.0)
MCH: 28.7 pg (ref 26.0–34.0)
MCHC: 34.5 g/dL (ref 30.0–36.0)
MCV: 83.3 fL (ref 80.0–100.0)
Platelets: 124 10*3/uL — ABNORMAL LOW (ref 150–400)
RBC: 2.82 MIL/uL — ABNORMAL LOW (ref 3.87–5.11)
RDW: 18.3 % — ABNORMAL HIGH (ref 11.5–15.5)
WBC: 10.9 10*3/uL — ABNORMAL HIGH (ref 4.0–10.5)
nRBC: 0 % (ref 0.0–0.2)

## 2020-12-13 LAB — PHOSPHORUS: Phosphorus: 2.2 mg/dL — ABNORMAL LOW (ref 2.5–4.6)

## 2020-12-13 LAB — VITAMIN B1: Vitamin B1 (Thiamine): 150.2 nmol/L (ref 66.5–200.0)

## 2020-12-13 IMAGING — DX DG CHEST 1V PORT
1 series · 1 of 1 positions shown · non-contrast
Comparison: [DATE].  CT [DATE].

CLINICAL DATA: Shortness of breath.

EXAM:
PORTABLE CHEST 1 VIEW

[chest ap]
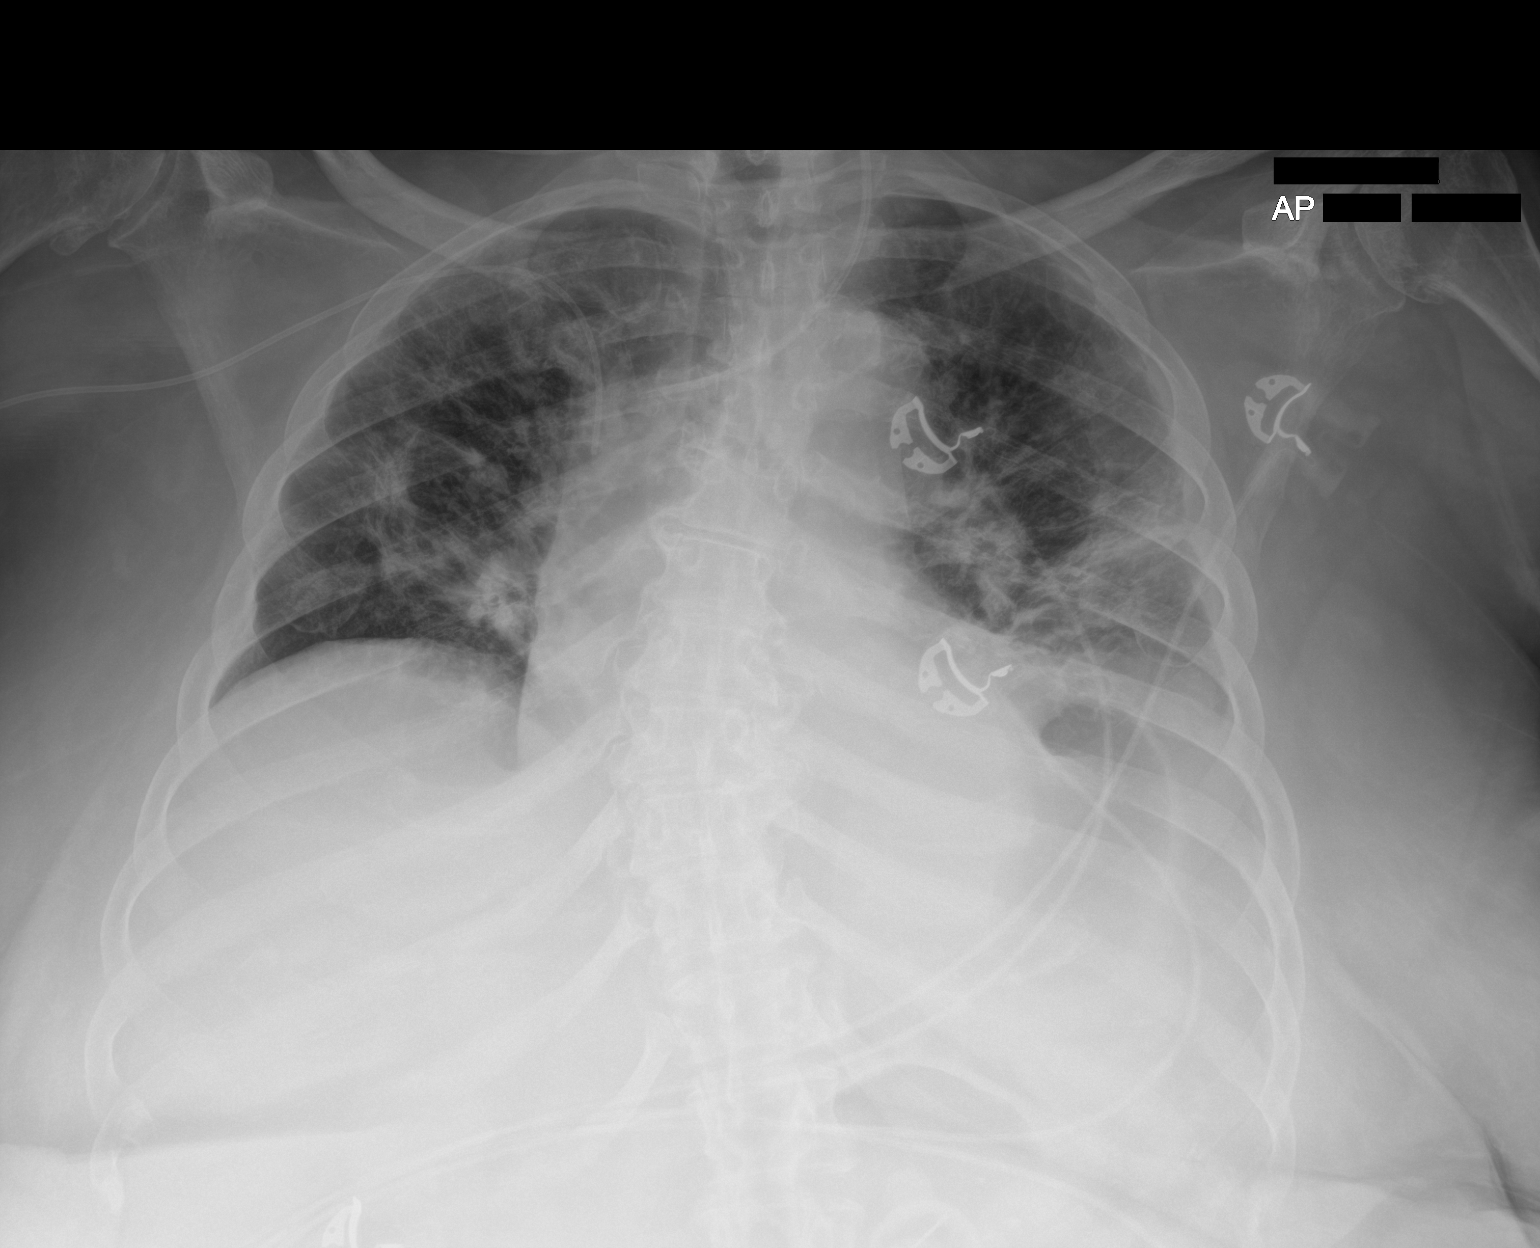

[1 of 1 positions shown; findings below may reference images not displayed]

FINDINGS: Right PICC line noted with tip over mid SVC. Left IJ line tip noted
over mid SVC. Heart size stable. Low lung volumes with progressive
left base atelectasis. Bilateral interstitial infiltrates again
noted without interim change. Tiny left pleural effusion cannot be
excluded. No pneumothorax.
IMPRESSION: 1. Right PICC line noted with tip over mid SVC. Left IJ line tip
noted over mid SVC.
2. Low lung volumes with progressive left base atelectasis.
Bilateral interstitial infiltrates again noted without interim
change.

## 2020-12-13 MED ORDER — OLANZAPINE 5 MG PO TABS
5.0000 mg | ORAL_TABLET | Freq: Every day | ORAL | Status: DC
  Administered 2020-12-14 – 2020-12-15 (×2): 5 mg
  Filled 2020-12-13 (×3): qty 1

## 2020-12-13 MED ORDER — MIRTAZAPINE 15 MG PO TBDP
45.0000 mg | ORAL_TABLET | Freq: Every day | ORAL | Status: DC
  Administered 2020-12-15: 45 mg
  Filled 2020-12-13 (×3): qty 3

## 2020-12-13 MED ORDER — ZINC OXIDE 40 % EX OINT
TOPICAL_OINTMENT | Freq: Three times a day (TID) | CUTANEOUS | Status: DC | PRN
  Filled 2020-12-13: qty 113

## 2020-12-13 MED ORDER — K PHOS MONO-SOD PHOS DI & MONO 155-852-130 MG PO TABS
500.0000 mg | ORAL_TABLET | ORAL | Status: AC
  Administered 2020-12-13 (×2): 500 mg
  Filled 2020-12-13 (×2): qty 2

## 2020-12-13 MED ORDER — K PHOS MONO-SOD PHOS DI & MONO 155-852-130 MG PO TABS
500.0000 mg | ORAL_TABLET | ORAL | Status: DC
  Filled 2020-12-13 (×2): qty 2

## 2020-12-13 MED ORDER — OSMOLITE 1.5 CAL PO LIQD
1000.0000 mL | ORAL | Status: DC
  Administered 2020-12-13: 1000 mL

## 2020-12-13 MED ORDER — DULOXETINE HCL 30 MG PO CPEP
30.0000 mg | ORAL_CAPSULE | Freq: Every day | ORAL | Status: DC
  Administered 2020-12-14 – 2020-12-16 (×3): 30 mg via ORAL
  Filled 2020-12-13 (×4): qty 1

## 2020-12-13 MED ORDER — PANTOPRAZOLE SODIUM 40 MG PO PACK
40.0000 mg | PACK | Freq: Two times a day (BID) | ORAL | Status: DC
  Administered 2020-12-14 – 2020-12-16 (×4): 40 mg
  Filled 2020-12-13 (×8): qty 20

## 2020-12-13 MED ORDER — FOLIC ACID 1 MG PO TABS
1.0000 mg | ORAL_TABLET | Freq: Every day | ORAL | Status: DC
  Administered 2020-12-13 – 2020-12-16 (×4): 1 mg
  Filled 2020-12-13 (×4): qty 1

## 2020-12-13 NOTE — Progress Notes (Signed)
SLP Cancellation Note  Patient Details Name: Chelsea Glass MRN: [id number] DOB: [birth year]   Cancelled treatment:       Reason Eval/Treat Not Completed: Medical issues which prohibited therapy (pt declined - not feeling well; NSG present). Pt verbalized she did not feel well at this time -- NSG present in room also. Noted pt currently on a cooling blanket; recent reports this morning per chart of N/V before/after TFs. HOB is elevated for Reflux precautions.  Pt nodded her head to SLP's return in the morning to see if she feels better then for po trials of ice chips, juice/water. NSG agreed.      Jerilynn Som, MS, CCC-SLP Speech Language Pathologist Rehab Services [phone] [facility] 12/13/2020, 3:09 PM

## 2020-12-13 NOTE — Progress Notes (Signed)
PHARMACY CONSULT NOTE  Pharmacy Consult for Electrolyte Monitoring and Replacement   Recent Labs: Potassium (mmol/L)  Date Value  12/13/2020 4.3   Magnesium (mg/dL)  Date Value  24/46/2863 2.1   Calcium (mg/dL)  Date Value  81/77/1165 9.1   Albumin (g/dL)  Date Value  79/12/8331 1.6 (L)   Phosphorus (mg/dL)  Date Value  83/29/1916 2.2 (L)   Sodium (mmol/L)  Date Value  12/13/2020 136   Assessment: 65 year old female with generalized deconditioning and failure to thrive. Dysphagia limiting safe PO intake, plan for G-tube placement. Pharmacy consult for electrolyte replacement.  Expect patient will be at risk for re-feeding when enteral access is established and feeds are started  Goal of Therapy:  K > 4 Mg > 2 All other electrolytes within normal limits  Plan:  Will order K Phos 2 tabs x 2.  --Will follow-up electrolytes with AM labs   Paschal Dopp, PharmD,  Clinical Pharmacist 12/13/2020

## 2020-12-13 NOTE — Progress Notes (Signed)
Mobility Specialist - Progress Note   12/13/20 1300  Mobility  Range of Motion/Exercises Right leg;Left leg (AP, ABD)  Level of Assistance Maximum assist, patient does 25-49%  Assistive Device None  Distance Ambulated (ft) 0 ft  Mobility Response Tolerated well  Mobility performed by Mobility specialist  $Mobility charge 1 Mobility   Pt was lying in bed upon arrival utilizing room air. Pt agreed to session. Pt denied pain, nausea, and fatigue. Pt appears flat in mood, but does respond to questions. Pt participated in bed-level therex: ankle pumps 2x10 and abduction 3x10. Pt does require maxA to perform abduction, but is able to complete ankle pumps with supervision. Overall, pt tolerated session well. Pt was left in bed with all needs in reach.    Filiberto Pinks Mobility Specialist 12/13/20, 1:16 PM

## 2020-12-13 NOTE — Progress Notes (Signed)
Physical Therapy Treatment Patient Details Name: Chelsea Glass MRN: [id number] DOB: 04/12/1956 Today's Date: 12/13/2020    History of Present Illness Pt is a 65 y/o F admitted from home on 12/03/20 with c/c of AMS, excessive sleepiness, nausea & dry heaves. Pt currently being treated for aspiration bibasal PNA & gallbladder sludge with associated nausea & dry heaves. PMH: OA, DM2, HTN, Covid 19 in September 2021    PT Comments    Patient appears more lethargic today and less interactive than previous session. Minimally verbal today, only nodding yes/no for communication. Patient is able to follow single step commands with extra time most of the time. Patient declined sitting head of bed up or chair position of bed due to fatigue. Patient participated with LE and UE therapeutic exercises and ROM. Heart rate 110bpm and no pain reported with activity. Recommend PT to maximize functional and safety to facilitate independence. Slow progress overall as patient continues to have significant weakness and requires total assistance for mobility. SNF is recommended at discharge.    Follow Up Recommendations  SNF     Equipment Recommendations  None recommended by PT    Recommendations for Other Services       Precautions / Restrictions Precautions Precautions: Fall Restrictions Weight Bearing Restrictions: No    Mobility  Bed Mobility Overal bed mobility: Needs Assistance             General bed mobility comments: total assistance for repositioning in bed. patient declined elevating head of bed or placing bed in chair position today    Transfers                    Ambulation/Gait                 Stairs             Wheelchair Mobility    Modified Rankin (Stroke Patients Only)       Balance                                            Cognition Arousal/Alertness: Awake/alert Behavior During Therapy: WFL for tasks  assessed/performed Overall Cognitive Status: Impaired/Different from baseline                                 General Comments: patient able to follow single step commands with extra time.      Exercises General Exercises - Lower Extremity Ankle Circles/Pumps: AAROM;Strengthening;Both;20 reps;Supine (performed x 2 sets of 20 reps each) Short Arc Quad: AAROM;Strengthening;Both;10 reps;Supine Hip ABduction/ADduction: AAROM;Strengthening;Both;10 reps;Supine Other Exercises Other Exercises: verbal and visual cues for exercie technique and particiaption. also performed gentle AAROM for BUE including shoulder flexion (to approximately 90 degrees on RUE and 60 degrees on LUE) elbow flexion/extension, wrist flexion/extension x 10 reps each with cues for technique    General Comments        Pertinent Vitals/Pain Pain Assessment: No/denies pain (noted pain patch on left knee (patient nods that it is helping her pain))    Home Living                      Prior Function            PT Goals (current goals can now be found in the care plan  section) Acute Rehab PT Goals Patient Stated Goal: none stated PT Goal Formulation: Patient unable to participate in goal setting Time For Goal Achievement: 12/18/20 Progress towards PT goals: Progressing toward goals (slow progress)    Frequency    Min 2X/week      PT Plan Current plan remains appropriate    Co-evaluation              AM-PAC PT "6 Clicks" Mobility   Outcome Measure  Help needed turning from your back to your side while in a flat bed without using bedrails?: Total Help needed moving from lying on your back to sitting on the side of a flat bed without using bedrails?: Total Help needed moving to and from a bed to a chair (including a wheelchair)?: Total Help needed standing up from a chair using your arms (e.g., wheelchair or bedside chair)?: Total Help needed to walk in hospital room?:  Total Help needed climbing 3-5 steps with a railing? : Total 6 Click Score: 6    End of Session   Activity Tolerance: Patient limited by fatigue Patient left: in bed;with call bell/phone within reach;with bed alarm set Nurse Communication: Mobility status PT Visit Diagnosis: Muscle weakness (generalized) (M62.81);Difficulty in walking, not elsewhere classified (R26.2)     Time: 3016-0109 PT Time Calculation (min) (ACUTE ONLY): 15 min  Charges:  $Therapeutic Exercise: 8-22 mins                     Donna Bernard, PT, MPT    Ina Homes 12/13/2020, 3:54 PM

## 2020-12-13 NOTE — Plan of Care (Signed)
Alert, oriented x2, c/o feeling nauseated before and after tube feeding. With large watery stools this shift and worsening skin breakdown on coccyx, sacrum, buttocks and ischium area. PRN medication given for nausea, elevated head of bed at 30 degree angle, educated pt and daughter on importance of keeping HOB at least 30 degree angle especially 2 hours after bolus meal. Paged provider for Desitin for skin breakdown and replaced foam dressing after each bowel movement. Will endorse to day shift nurse for recommendation for change in tube feeding formula.

## 2020-12-13 NOTE — Progress Notes (Signed)
PROGRESS NOTE    Chelsea Glass  YBW:[id number] DOB: 03/15/1956 DOA: 12/03/2020 PCP: [facility]    Brief Narrative:  Chelsea Glass is a 65 y.o. African-American female with medical history significant for osteoarthritis, type 2 diabetes mellitus and hypertension, as well as COVID-19 in September 2021, who presented to the emergency room with acute onset of altered mental status and not acting herself for the last 4 days with excessive sleepiness. She has been having nausea and dry heaves without abdominal pain.  Her daughter stated that she has not eaten much and has been bed bound since September 2021 since her COVID-19 infection. Patient was admitted to the hospitalist for further work-up and treatment of acute metabolic encephalopathy and failure to thrive  On 3/1-pt was tachycardic with HR in 140's, transition to atrial fibrillation with RVR, with some transient hypotension requiring transfer to the ICU.  Hypotension resolved with 500 cc normal saline.  PCCM was consulted for further assistance along with placement of central venous access due to lack of peripheral IV access.  Cardiology consulted on 3/1-recom amiodarone loading dose . Also placed orders for dig ivp if hr dosent stabilized. Heparin infusion for Chadsvas score 4.  Week of Dr. Marylu Lund 3/2-hospitalist pickup  From ICU 3/3-Hg 6.5 this am, repeat Hg 7.4 3/4-hemoglobin 6.4 and repeat 6.3.  Talk to daughter she is agreeable to blood transfusion.  Low urine output 3/5-low urine output. Hg 7.1 3/6-creatinine up 1.52. . S/p 1 unit prbc on 3/5. Feeding was started on 3/5. 3/7-more communicative today. Has no complaints.  3/8-this am pt did not tolerating TF per nsg. Became sob, although 02 sat on RA was 95%. Repeat cxr no change /worsening. TF was stopped for bowel rest and to be resumed at low rate after few hours.   consultants:   PCCM, psych, surgery, cardiology, palliative, nephrology  Procedures:    Antimicrobials:       Subjective: C/o some sob. No abd pain, n/v or cp  Objective: Vitals:   12/12/20 1527 12/12/20 2147 12/13/20 0424 12/13/20 0754  BP: 115/65 119/77 (!) 123/99 128/88  Pulse: 96 97 (!) 102 (!) 107  Resp: 16  18 18   Temp: 98.2 F (36.8 C) 98.2 F (36.8 C) 98.4 F (36.9 C) 98.6 F (37 C)  TempSrc: Oral Oral Oral   SpO2: 100% 100% 98% 95%  Weight:      Height:        Intake/Output Summary (Last 24 hours) at 12/13/2020 0754 Last data filed at 12/13/2020 0050 Gross per 24 hour  Intake --  Output 1225 ml  Net -1225 ml   Filed Weights   12/09/20 0045 12/11/20 0119 12/12/20 0500  Weight: 112 kg 120.2 kg 126.4 kg    Examination: Lying in bed, NAD mildly short of breath Anteriorly difficult to hear appears to be clear no wheezing Distant heart sounds regular S1-S2 no gallops Soft nontender nondistended PEG tube in place SCDs in place Awake and alert, grossly intact  mood and affect appropriate in current setting     Data Reviewed: I have personally reviewed following labs and imaging studies  CBC: Recent Labs  Lab 12/07/20 0730 12/08/20 0810 12/08/20 0930 12/09/20 0615 12/09/20 0805 12/10/20 0806 12/11/20 0425 12/13/20 0353  WBC 10.8* 13.3*  --  13.0*  --   --  11.2* 10.9*  HGB 9.6* 6.5*   < > 6.4* 6.3* 7.1* 9.3* 8.1*  HCT 28.0* 19.1*   < > 18.6* 19.1* 20.4* 26.8* 23.5*  MCV 84.1 83.8  --  84.5  --   --  83.2 83.3  PLT 247 214  --  210  --   --  130* 124*   < > = values in this interval not displayed.   Basic Metabolic Panel: Recent Labs  Lab 12/08/20 0501 12/09/20 0615 12/10/20 0806 12/10/20 1608 12/11/20 0425 12/12/20 0450 12/13/20 0353  NA 141 140 138  --  136 137 136  K 3.3* 3.6 2.9* 4.0 3.9 4.2 4.3  CL 115* 114* 114*  --  111 111 110  CO2 19* 18* 16*  --  18* 19* 20*  GLUCOSE 191* 157* 147*  --  183* 101* 145*  BUN 19 17 16   --  18 20 24*  CREATININE 1.35* 1.29* 1.32*  --  1.52* 1.61* 1.77*  CALCIUM 8.5* 8.3* 7.8*  --   8.4* 8.8* 9.1  MG 1.6* 1.8 1.7  --  2.0 2.1  --   PHOS 2.0* 2.4* 2.7  --  2.8 2.3* 2.2*   GFR: Estimated Creatinine Clearance: 41.9 mL/min (A) (by C-G formula based on SCr of 1.77 mg/dL (H)). Liver Function Tests: Recent Labs  Lab 12/06/20 1647 12/07/20 0437 12/08/20 0501 12/09/20 0615 12/10/20 0806 12/11/20 0425  AST 23 25 27  33  --   --   ALT 34 34 35 41  --   --   ALKPHOS 239* 250* 195* 180*  --   --   BILITOT 1.0 0.5 0.6 0.7  --   --   PROT 4.9* 4.9* 4.3* 4.2*  --   --   ALBUMIN 2.0* 2.0* 1.8* 1.8* 1.5* 1.6*   No results for input(s): LIPASE, AMYLASE in the last 168 hours. No results for input(s): AMMONIA in the last 168 hours. Coagulation Profile: Recent Labs  Lab 12/06/20 1647  INR 1.3*   Cardiac Enzymes: No results for input(s): CKTOTAL, CKMB, CKMBINDEX, TROPONINI in the last 168 hours. BNP (last 3 results) No results for input(s): PROBNP in the last 8760 hours. HbA1C: No results for input(s): HGBA1C in the last 72 hours. CBG: Recent Labs  Lab 12/12/20 1528 12/12/20 2016 12/12/20 2346 12/13/20 0026 12/13/20 0410  GLUCAP 107* 138* 159* 154* 110*   Lipid Profile: No results for input(s): CHOL, HDL, LDLCALC, TRIG, CHOLHDL, LDLDIRECT in the last 72 hours. Thyroid Function Tests: No results for input(s): TSH, T4TOTAL, FREET4, T3FREE, THYROIDAB in the last 72 hours. Anemia Panel: Recent Labs    12/12/20 0500  FOLATE 5.1*  FERRITIN 887*  TIBC NOT CALCULATED  IRON 55   Sepsis Labs: Recent Labs  Lab 12/06/20 1647  PROCALCITON <0.10    Recent Results (from the past 240 hour(s))  Resp Panel by RT-PCR (Flu A&B, Covid) Nasopharyngeal Swab     Status: None   Collection Time: 12/03/20  1:35 PM   Specimen: Nasopharyngeal Swab; Nasopharyngeal(NP) swabs in vial transport medium  Result Value Ref Range Status   SARS Coronavirus 2 by RT PCR NEGATIVE NEGATIVE Final    Comment: (NOTE) SARS-CoV-2 target nucleic acids are NOT DETECTED.  The SARS-CoV-2 RNA is  generally detectable in upper respiratory specimens during the acute phase of infection. The lowest concentration of SARS-CoV-2 viral copies this assay can detect is 138 copies/mL. A negative result does not preclude SARS-Cov-2 infection and should not be used as the sole basis for treatment or other patient management decisions. A negative result may occur with  improper specimen collection/handling, submission of specimen other than nasopharyngeal swab, presence of  viral mutation(s) within the areas targeted by this assay, and inadequate number of viral copies(<138 copies/mL). A negative result must be combined with clinical observations, patient history, and epidemiological information. The expected result is Negative.  Fact Sheet for Patients:  BloggerCourse.com  Fact Sheet for Healthcare Providers:  SeriousBroker.it  This test is no t yet approved or cleared by the [place] FDA and  has been authorized for detection and/or diagnosis of SARS-CoV-2 by FDA under an Emergency Use Authorization (EUA). This EUA will remain  in effect (meaning this test can be used) for the duration of the COVID-19 declaration under Section 564(b)(1) of the Act, 21 U.S.C.section 360bbb-3(b)(1), unless the authorization is terminated  or revoked sooner.       Influenza A by PCR NEGATIVE NEGATIVE Final   Influenza B by PCR NEGATIVE NEGATIVE Final    Comment: (NOTE) The Xpert Xpress SARS-CoV-2/FLU/RSV plus assay is intended as an aid in the diagnosis of influenza from Nasopharyngeal swab specimens and should not be used as a sole basis for treatment. Nasal washings and aspirates are unacceptable for Xpert Xpress SARS-CoV-2/FLU/RSV testing.  Fact Sheet for Patients: BloggerCourse.com  Fact Sheet for Healthcare Providers: SeriousBroker.it  This test is not yet approved or cleared by the [place] FDA and has been authorized for detection and/or diagnosis of SARS-CoV-2 by FDA under an Emergency Use Authorization (EUA). This EUA will remain in effect (meaning this test can be used) for the duration of the COVID-19 declaration under Section 564(b)(1) of the Act, 21 U.S.C. section 360bbb-3(b)(1), unless the authorization is terminated or revoked.  Performed at [facility], [address] [place], [place] [address]   CULTURE, BLOOD (ROUTINE X 2) w Reflex to ID Panel     Status: None   Collection Time: 12/06/20 10:55 AM   Specimen: BLOOD  Result Value Ref Range Status   Specimen Description BLOOD LEFT WRIST  Final   Special Requests   Final    BOTTLES DRAWN AEROBIC ONLY Blood Culture adequate volume   Culture   Final    NO GROWTH 5 DAYS Performed at [facility], [address] [place], [place] [address]    Report Status 12/11/2020 FINAL  Final  CULTURE, BLOOD (ROUTINE X 2) w Reflex to ID Panel     Status: None   Collection Time: 12/06/20 11:05 AM   Specimen: BLOOD  Result Value Ref Range Status   Specimen Description BLOOD RIGHT WRIST  Final   Special Requests   Final    BOTTLES DRAWN AEROBIC ONLY Blood Culture results may not be optimal due to an inadequate volume of blood received in culture bottles   Culture   Final    NO GROWTH 5 DAYS Performed at [facility], [address] [place], [place] [address]    Report Status 12/11/2020 FINAL  Final  MRSA PCR Screening     Status: None   Collection Time: 12/06/20  1:18 PM   Specimen: Nasal Mucosa; Nasopharyngeal  Result Value Ref Range Status   MRSA by PCR NEGATIVE NEGATIVE Final    Comment:        The GeneXpert MRSA Assay (FDA approved for NASAL specimens only), is one component of a comprehensive MRSA colonization surveillance program. It is not intended to diagnose MRSA infection nor to guide or monitor treatment for MRSA infections. Performed at [facility], [address], [place], [place] [address]   Urine Culture     Status: None  Collection Time: 12/06/20  1:18 PM   Specimen: Urine, Random  Result Value Ref Range Status   Specimen Description   Final    URINE, RANDOM Performed at [facility], [address] [place], [place] [address]    Special Requests   Final    NONE Performed at [facility], [address] [place], [place] [address]    Culture   Final    NO GROWTH Performed at [facility], [address] [place], [place] [address]    Report Status 12/08/2020 FINAL  Final         Radiology Studies: No results found.      Scheduled Meds: . sodium chloride   Intravenous Once  . sodium chloride   Intravenous Once  . amiodarone  200 mg Per Tube BID  . vitamin C  250 mg Per Tube BID  . Chlorhexidine Gluconate Cloth  6 each Topical Daily  . diclofenac Sodium  2 g Topical QID  . DULoxetine  30 mg Oral Daily  . feeding supplement (OSMOLITE 1.5 CAL)  237 mL Per Tube 6 X Daily  . feeding supplement (PROSource TF)  45 mL Per Tube BID  . free water  100 mL Per Tube 6 X Daily  . insulin aspart  0-9 Units Subcutaneous Q4H  . lidocaine  1 patch Transdermal Q24H  . midodrine  10 mg Per Tube TID WC  . mirtazapine  45 mg Oral QHS  . OLANZapine zydis  5 mg Oral QHS  . pantoprazole  40 mg Oral BID  . phosphorus  500 mg Per Tube Q4H   Continuous Infusions: . sodium chloride Stopped (12/10/20 2201)  . albumin human 25 g (12/13/20 0553)    Assessment & Plan:   Principal Problem:   Depression Active Problems:   AKI (acute kidney injury) (HCC)   Adult failure to thrive   Hypokalemia   Weight loss   Altered mental status   Tachycardia   Hypotension   Chelsea Thompsonis a 66 y.o.African-American femalewith medical history significant forosteoarthritis, type 2 diabetes mellitus and hypertension, as well as COVID-19 in September 2021, who presented to the emergency room with acute onset of  altered mental status and not acting herself for the last 4 days with excessive sleepiness.She has been having nausea and dry heaves without abdominal pain. Her daughter stated that she has not eaten much and has been bed bound since September 2021 since her COVID-19 infection.    Adult failure to thrive/poor PO intake/generalized deconditioning Hypokalemia/depressed mood -- patient came in with ongoing weight loss and poor PO intake per daughter. Since COVID infection in September 2021 per daughter patient has been going downhill. --She was admitted at [facility] after discharge from [facility] with adult failure to thrive and poor PO intake. Patient also went to rehab in [place]. -- She recently was seen in [facility] emergency room on 10th of February she was discharged to go home with home health and daughter thinking patient will hopefully do better with her mood and eating. -- Daughter says patient has lost more than hundred pounds since September 2021. She has not ambulated and no interest in any acitivity. Stays in bed most of the time --she is requesting if patient can get a feeding tube -- dietitian consult-- for PEG/NG feeding -- speech therapy-- patient showed very little interest in participating with speech therapy. Pt has Dsyphagia and at risk for aspiration. --spoke with IR RN regarding PICC  line and G-tube/peg tube placement  --discussed with daughter risk and complications of feeding tube, she voiced understanding patient's daughter will sign the consent. --3/1-- patient is tachycardic despite IV fluids and IV metoprolol 5 mg x1 dose soft blood pressure and continued encephalopathy. Will's transfer her to ICU --spoke with ICU NP Jeri Modena to follow pt.--pt will need Central line due to poor IV access. 3/2-TRH pickup.  3/4-Plan for IR for Peg placement today pending  3/5-s/p PEG tube placement on 3/4 3/6-Feeding initiated on 3/5 and tolerating 3/8-did not tolerate tube feeding  today and became short of breath.  Questionable aspiration.  Chest x-ray without any changes .O2 sats stable on room air.  Placed on nasal cannula just for comfort.   Hold off on tube feeding for few hours to rest bowel and will resume at a lower rate as tolerated discussed this with nursing.   Last day of albumin infusion today to complete 3 doses     Hypotension with sinus tachycardia, acute renal failure Hypokalemia hypothermic AKI improved with ivf...>up mildly again at 1.35, possibly prerenal due to hypotension 3/4-she is anemic and still hypotensive  3/5-add midodrine Stool occult pending  S/p 1 unit prbc transfusion on 3/4 and 3/5 3/7-BP improving.  Will increase midodrine to 10 mg 3 times daily 3/8 BP improved and stable.   Plan: Continue with midodrine  Continue with IV albumin to complete final dose today  Continue warming device      Atrial fib-rvr Was started on amiodarone infusion ...>remains in NSR.  Cards following 3/4 heparin was discontinued on 3/3 due to drop in hemoglobin  3/7-Hemoccult stool positive. 3/8-continue to hold a/c due to GIB, needing transfusion     Acute drop in H/H GIB -initial hemoglobin this a.m. was 6.5 from 9.6.  Repeat is 7.4. Will hold Toradol, heparin drip 3/7 status post 2 units of transfusion, 3/4 and 3/5 Hemoccult stool positive Started on PPI  IV twice>>>switched to po on 3/7 Post infusion hemoglobin stable Per GI no evidence of active bleed.No indication for inpatient endoscopy evaluation urgently unless patient demonstrates signs of significant active GI bleed such as melena or rectal bleed or hematochezia.  Risk outweighs the benefit of endoscopy procedure at this time Recommended anemia work-up labs.  If presence of iron deficiency, endoscopy evaluation can be pursued.Even if there is occult GI malignancy patient is not a candidate for surgery or chemo due to severe protein caloric malnutrition.,  Therefore recommended  endoscopy evaluation once the patient recovers from failure to thrive and this can be pursued as outpatient 3/8-Hg 9.3>>8.1 today Transfuse if Hg <7    Decrease Urine Output/AKI Nephrology following Creatinine continues to rise, today  1.29>>>1.52>>1.61>>1.77 Urinalysis with proteinuria hematuria History of diabetic nephropathy IV fluids discontinued 3/8-renal function worsening Completing last dose of albumin ordered by nephrology Low threshold for IV diuretics, but will hold off at this time based on increased urine output  Avoid hypotension-continue midodrine    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 Neuro- recommends discussed with neurosurgery. --3/1-- per Dr Adriana Simas "This looks rather stable compared to CT from previous. This is likely benign, possible even cystic. She would need a contrasted image to delineate. No urgent intervention needed as we know it has been there for some time. Unrelated to current issues and wont cause encephalopathy  3/2-f/u neurosurgery as outpt to discuss options going forward once she recovers from acute illness. No intervention recommended at  this time.    Abnormal ultrasound abdomen with gallbladder distention similar to previous imaging studies -- clinically patient does not appear to be septic or having any infection -- discontinue IV antibiotics-- no source of infection noted so far, chest x-ray negative for pneumonia. Appears to have some opacities from previous COVID infection.sats are more than 92% on room air 3/2-surgery Dr. Lady Gary - no surgical indication at present for gallbladder. Labs stable.     Depressed mood -- continue Cymbalta , remeron --  psych consult with Dr. Toni Amend.   -- patient has appointment for geriatric evaluation at [facility] per daughter in April 2022  Type II diabetes with sugars controlled -- sliding scale insulin -- hold PO diabetes meds  History  of tachycardia/SVT -- patient follows with cardiology at [facility] -- recently started on Toprol-XL hundred milligrams daily. With soft blood pressure decreased dose to 25 mg daily..>discontinued now due to hypotension  Generalized deconditioning -- PT evaluation noted--rec SNF  Palliative following   DVT prophylaxis: scd.  No Lovenox due to GI bleed Code Status:full Family Communication: daugter updated  Status is: Inpatient Patient is inpatient due to severity of her illness needing hospitalization , need IV treatments  Dispo: The patient is from: Home              Anticipated d/c is to: SNF              Patient currently is not medically stable to d/c.   Difficult to place patient No                anticipated discharge date:>3 days Pt UO is low, anemic, getting, iv albumin, renal function worsening           LOS: 10 days   Time spent: 35 min with >50% on coc    Chelsea Ito, MD [facility] Pager [phone]  If 7PM-7AM, please contact night-coverage 12/13/2020, 7:54 AM

## 2020-12-13 NOTE — Progress Notes (Signed)
[facility]  ROUNDING NOTE   Subjective:   Patient seen resting in bed Alert and able to answer simple questions Denies nausea and shortness of breath  UOP  Objective:  Vital signs in last 24 hours:  Temp:  [98.2 F (36.8 C)-98.6 F (37 C)] 98.6 F (37 C) (03/08 1202) Pulse Rate:  [96-107] 104 (03/08 1202) Resp:  [16-18] 18 (03/08 1202) BP: (115-128)/(65-99) 128/90 (03/08 1202) SpO2:  [95 %-100 %] 100 % (03/08 1202)  Weight change:  Filed Weights   12/09/20 0045 12/11/20 0119 12/12/20 0500  Weight: 112 kg 120.2 kg 126.4 kg    Intake/Output: I/O last 3 completed shifts: In: 200 [IV Piggyback:200] Out: 1225 [Urine:1225]   Intake/Output this shift:  No intake/output data recorded.  Physical Exam: General: NAD, laying in bed  Head: Normocephalic, atraumatic. Moist oral mucosal membranes  Eyes: Anicteric, PERRL  Neck: Supple, trachea midline  Lungs:  Clear to auscultation  Heart: Regular rate and rhythm  Abdomen:  +G-tube  Extremities:  + peripheral edema.  Neurologic: Able to answer simple questions  Skin: No lesions  GU: Foley with urine    Basic Metabolic Panel: Recent Labs  Lab 12/08/20 0501 12/09/20 0615 12/10/20 0806 12/10/20 1608 12/11/20 0425 12/12/20 0450 12/13/20 0353  NA 141 140 138  --  136 137 136  K 3.3* 3.6 2.9* 4.0 3.9 4.2 4.3  CL 115* 114* 114*  --  111 111 110  CO2 19* 18* 16*  --  18* 19* 20*  GLUCOSE 191* 157* 147*  --  183* 101* 145*  BUN 19 17 16   --  18 20 24*  CREATININE 1.35* 1.29* 1.32*  --  1.52* 1.61* 1.77*  CALCIUM 8.5* 8.3* 7.8*  --  8.4* 8.8* 9.1  MG 1.6* 1.8 1.7  --  2.0 2.1  --   PHOS 2.0* 2.4* 2.7  --  2.8 2.3* 2.2*    Liver Function Tests: Recent Labs  Lab 12/06/20 1647 12/07/20 0437 12/08/20 0501 12/09/20 0615 12/10/20 0806 12/11/20 0425  AST 23 25 27  33  --   --   ALT 34 34 35 41  --   --   ALKPHOS 239* 250* 195* 180*  --   --   BILITOT 1.0 0.5 0.6 0.7  --   --   PROT 4.9* 4.9*  4.3* 4.2*  --   --   ALBUMIN 2.0* 2.0* 1.8* 1.8* 1.5* 1.6*   No results for input(s): LIPASE, AMYLASE in the last 168 hours. No results for input(s): AMMONIA in the last 168 hours.  CBC: Recent Labs  Lab 12/07/20 0730 12/08/20 0810 12/08/20 0930 12/09/20 0615 12/09/20 0805 12/10/20 0806 12/11/20 0425 12/13/20 0353  WBC 10.8* 13.3*  --  13.0*  --   --  11.2* 10.9*  HGB 9.6* 6.5*   < > 6.4* 6.3* 7.1* 9.3* 8.1*  HCT 28.0* 19.1*   < > 18.6* 19.1* 20.4* 26.8* 23.5*  MCV 84.1 83.8  --  84.5  --   --  83.2 83.3  PLT 247 214  --  210  --   --  130* 124*   < > = values in this interval not displayed.    Cardiac Enzymes: No results for input(s): CKTOTAL, CKMB, CKMBINDEX, TROPONINI in the last 168 hours.  BNP: Invalid input(s): POCBNP  CBG: Recent Labs  Lab 12/12/20 2346 12/13/20 0026 12/13/20 0410 12/13/20 0823 12/13/20 1241  GLUCAP 159* 154* 110* 171* 159*    Microbiology:  Results for orders placed or performed during the hospital encounter of 12/03/20  Resp Panel by RT-PCR (Flu A&B, Covid) Nasopharyngeal Swab     Status: None   Collection Time: 12/03/20  1:35 PM   Specimen: Nasopharyngeal Swab; Nasopharyngeal(NP) swabs in vial transport medium  Result Value Ref Range Status   SARS Coronavirus 2 by RT PCR NEGATIVE NEGATIVE Final    Comment: (NOTE) SARS-CoV-2 target nucleic acids are NOT DETECTED.  The SARS-CoV-2 RNA is generally detectable in upper respiratory specimens during the acute phase of infection. The lowest concentration of SARS-CoV-2 viral copies this assay can detect is 138 copies/mL. A negative result does not preclude SARS-Cov-2 infection and should not be used as the sole basis for treatment or other patient management decisions. A negative result may occur with  improper specimen collection/handling, submission of specimen other than nasopharyngeal swab, presence of viral mutation(s) within the areas targeted by this assay, and inadequate number of  viral copies(<138 copies/mL). A negative result must be combined with clinical observations, patient history, and epidemiological information. The expected result is Negative.  Fact Sheet for Patients:  BloggerCourse.com  Fact Sheet for Healthcare Providers:  SeriousBroker.it  This test is no t yet approved or cleared by the [place] FDA and  has been authorized for detection and/or diagnosis of SARS-CoV-2 by FDA under an Emergency Use Authorization (EUA). This EUA will remain  in effect (meaning this test can be used) for the duration of the COVID-19 declaration under Section 564(b)(1) of the Act, 21 U.S.C.section 360bbb-3(b)(1), unless the authorization is terminated  or revoked sooner.       Influenza A by PCR NEGATIVE NEGATIVE Final   Influenza B by PCR NEGATIVE NEGATIVE Final    Comment: (NOTE) The Xpert Xpress SARS-CoV-2/FLU/RSV plus assay is intended as an aid in the diagnosis of influenza from Nasopharyngeal swab specimens and should not be used as a sole basis for treatment. Nasal washings and aspirates are unacceptable for Xpert Xpress SARS-CoV-2/FLU/RSV testing.  Fact Sheet for Patients: BloggerCourse.com  Fact Sheet for Healthcare Providers: SeriousBroker.it  This test is not yet approved or cleared by the [place] FDA and has been authorized for detection and/or diagnosis of SARS-CoV-2 by FDA under an Emergency Use Authorization (EUA). This EUA will remain in effect (meaning this test can be used) for the duration of the COVID-19 declaration under Section 564(b)(1) of the Act, 21 U.S.C. section 360bbb-3(b)(1), unless the authorization is terminated or revoked.  Performed at [facility], [address] [place], [place] [address]   CULTURE, BLOOD (ROUTINE X 2) w Reflex to ID Panel     Status: None   Collection Time: 12/06/20 10:55 AM    Specimen: BLOOD  Result Value Ref Range Status   Specimen Description BLOOD LEFT WRIST  Final   Special Requests   Final    BOTTLES DRAWN AEROBIC ONLY Blood Culture adequate volume   Culture   Final    NO GROWTH 5 DAYS Performed at [facility], [address] [place], [place] [address]    Report Status 12/11/2020 FINAL  Final  CULTURE, BLOOD (ROUTINE X 2) w Reflex to ID Panel     Status: None   Collection Time: 12/06/20 11:05 AM   Specimen: BLOOD  Result Value Ref Range Status   Specimen Description BLOOD RIGHT WRIST  Final   Special Requests   Final    BOTTLES DRAWN AEROBIC ONLY Blood Culture results may not be optimal due to an  inadequate volume of blood received in culture bottles   Culture   Final    NO GROWTH 5 DAYS Performed at [facility], [address] [place], [place] [address]    Report Status 12/11/2020 FINAL  Final  MRSA PCR Screening     Status: None   Collection Time: 12/06/20  1:18 PM   Specimen: Nasal Mucosa; Nasopharyngeal  Result Value Ref Range Status   MRSA by PCR NEGATIVE NEGATIVE Final    Comment:        The GeneXpert MRSA Assay (FDA approved for NASAL specimens only), is one component of a comprehensive MRSA colonization surveillance program. It is not intended to diagnose MRSA infection nor to guide or monitor treatment for MRSA infections. Performed at [facility], [address], [place], [place] [address]   Urine Culture     Status: None   Collection Time: 12/06/20  1:18 PM   Specimen: Urine, Random  Result Value Ref Range Status   Specimen Description   Final    URINE, RANDOM Performed at [facility], [address] [place], [place] [address]    Special Requests   Final    NONE Performed at [facility], [address] [place], [place] [address]    Culture   Final    NO GROWTH Performed at [facility], [address] [place], [place] [address]    Report Status  12/08/2020 FINAL  Final    Coagulation Studies: No results for input(s): LABPROT, INR in the last 72 hours.  Urinalysis: No results for input(s): COLORURINE, LABSPEC, PHURINE, GLUCOSEU, HGBUR, BILIRUBINUR, KETONESUR, PROTEINUR, UROBILINOGEN, NITRITE, LEUKOCYTESUR in the last 72 hours.  Invalid input(s): APPERANCEUR    Imaging: DG Chest Port 1 View  Result Date: 12/13/2020 CLINICAL DATA:  Shortness of breath. EXAM: PORTABLE CHEST 1 VIEW COMPARISON:  12/06/2020.  CT 06/21/2020. FINDINGS: Right PICC line noted with tip over mid SVC. Left IJ line tip noted over mid SVC. Heart size stable. Low lung volumes with progressive left base atelectasis. Bilateral interstitial infiltrates again noted without interim change. Tiny left pleural effusion cannot be excluded. No pneumothorax. IMPRESSION: 1. Right PICC line noted with tip over mid SVC. Left IJ line tip noted over mid SVC. 2. Low lung volumes with progressive left base atelectasis. Bilateral interstitial infiltrates again noted without interim change. Electronically Signed   By: Maisie Fus  Register   On: 12/13/2020 08:25     Medications:   . sodium chloride Stopped (12/10/20 2201)  . albumin human 25 g (12/13/20 0553)   . sodium chloride   Intravenous Once  . sodium chloride   Intravenous Once  . amiodarone  200 mg Per Tube BID  . vitamin C  250 mg Per Tube BID  . Chlorhexidine Gluconate Cloth  6 each Topical Daily  . diclofenac Sodium  2 g Topical QID  . DULoxetine  30 mg Oral Daily  . feeding supplement (OSMOLITE 1.5 CAL)  237 mL Per Tube 6 X Daily  . feeding supplement (PROSource TF)  45 mL Per Tube BID  . folic acid  1 mg Per Tube Daily  . free water  100 mL Per Tube 6 X Daily  . insulin aspart  0-9 Units Subcutaneous Q4H  . lidocaine  1 patch Transdermal Q24H  . midodrine  10 mg Per Tube TID WC  . mirtazapine  45 mg Oral QHS  . OLANZapine zydis  5 mg Oral QHS  . pantoprazole  40 mg Oral BID  . phosphorus  500 mg Per Tube Q4H    sodium chloride, acetaminophen **OR** acetaminophen, fentaNYL (SUBLIMAZE) injection, liver oil-zinc oxide, magnesium hydroxide, ondansetron **OR** ondansetron (ZOFRAN) IV  Assessment/ Plan:  Ms. Chelsea Glass is a 65 y.o. black female with diabetes mellitus type II, hypertension, osteoarthritis, hyperlipidemia who was [facility] 12/03/2020 for AKI (acute kidney injury) (HCC) [N17.9] Altered mental status, unspecified altered mental status type [R41.82]  Found to have aspiration pneumonia  1. Acute kidney injury: baseline creatinine of 0.95 with normal GFR >60 on 12/10/13/2019.  Urinalysis with proteinuria and hematuria.  History of diabetic nephropathy.  -last dose of IV albumin today -Improved BLE edema - Will hold diuretics at this time based on increased UOP  2. Hypotension: Holding home metoprolol dose.  - midodrine  3. Hypokalemia: improved with IV fluids.  -current level 4.3  4. Metabolic acidosis:  - may require diuretics for contration  5. Anemia with renal failure: PRBC transfusion 3/4 and 3/6 Currently 8.1 Will continue monitor   LOS: 10 Shantelle Breeze 3/8/20221:02 PM

## 2020-12-13 NOTE — Progress Notes (Signed)
SUBJECTIVE: Patient still short of breath   Vitals:   12/12/20 1527 12/12/20 2147 12/13/20 0424 12/13/20 0754  BP: 115/65 119/77 (!) 123/99 128/88  Pulse: 96 97 (!) 102 (!) 107  Resp: 16  18 18   Temp: 98.2 F (36.8 C) 98.2 F (36.8 C) 98.4 F (36.9 C) 98.6 F (37 C)  TempSrc: Oral Oral Oral   SpO2: 100% 100% 98% 95%  Weight:      Height:        Intake/Output Summary (Last 24 hours) at 12/13/2020 1003 Last data filed at 12/13/2020 0050 Gross per 24 hour  Intake -  Output 1225 ml  Net -1225 ml    LABS: Basic Metabolic Panel: Recent Labs    12/11/20 0425 12/12/20 0450 12/13/20 0353  NA 136 137 136  K 3.9 4.2 4.3  CL 111 111 110  CO2 18* 19* 20*  GLUCOSE 183* 101* 145*  BUN 18 20 24*  CREATININE 1.52* 1.61* 1.77*  CALCIUM 8.4* 8.8* 9.1  MG 2.0 2.1  --   PHOS 2.8 2.3* 2.2*   Liver Function Tests: Recent Labs    12/11/20 0425  ALBUMIN 1.6*   No results for input(s): LIPASE, AMYLASE in the last 72 hours. CBC: Recent Labs    12/11/20 0425 12/13/20 0353  WBC 11.2* 10.9*  HGB 9.3* 8.1*  HCT 26.8* 23.5*  MCV 83.2 83.3  PLT 130* 124*   Cardiac Enzymes: No results for input(s): CKTOTAL, CKMB, CKMBINDEX, TROPONINI in the last 72 hours. BNP: Invalid input(s): POCBNP D-Dimer: No results for input(s): DDIMER in the last 72 hours. Hemoglobin A1C: No results for input(s): HGBA1C in the last 72 hours. Fasting Lipid Panel: No results for input(s): CHOL, HDL, LDLCALC, TRIG, CHOLHDL, LDLDIRECT in the last 72 hours. Thyroid Function Tests: No results for input(s): TSH, T4TOTAL, T3FREE, THYROIDAB in the last 72 hours.  Invalid input(s): FREET3 Anemia Panel: Recent Labs    12/12/20 0500  FOLATE 5.1*  FERRITIN 887*  TIBC NOT CALCULATED  IRON 55     PHYSICAL EXAM General: Well developed, well nourished, in no acute distress HEENT:  Normocephalic and atramatic Neck:  No JVD.  Lungs: Clear bilaterally to auscultation and percussion. Heart: HRRR . Normal S1  and S2 without gallops or murmurs.  Abdomen: Bowel sounds are positive, abdomen soft and non-tender  Msk:  Back normal, normal gait. Normal strength and tone for age. Extremities: No clubbing, cyanosis or edema.   Neuro: Alert and oriented X 3. Psych:  Good affect, responds appropriately  TELEMETRY: Sinus tachycardia   ASSESSMENT AND PLAN status post atrial fibrillation with rapid ventricular response rate currently on p.o. amiodarone and remains in sinus rhythm.  Principal Problem:   Depression Active Problems:   AKI (acute kidney injury) (HCC)   Adult failure to thrive   Hypokalemia   Weight loss   Altered mental status   Tachycardia   Hypotension    KHAN,SHAUKAT A, MD, [facility] 12/13/2020 10:03 AM

## 2020-12-13 NOTE — Progress Notes (Signed)
Nutrition Follow Up Note   DOCUMENTATION CODES:   Obesity unspecified  INTERVENTION:   Change to Osmolite 1.5 _0 /hr- Initiate at 12m/hr and increase by 144mhr q 12 hours until goal rate is reached.  Free water flushes10054m4 hours  Pro-Source 16m62mvia tube, provides 40kcal and 11g of protein per serving   Regimen provides 2240kcal/day 112g/day protein and 1697ml83m free water  Recommend vitamin C 250mg 60mvia tube  NUTRITION DIAGNOSIS:   Inadequate oral intake related to lethargy/confusion as evidenced by NPO status. -ongoing  GOAL:   Patient will meet greater than or equal to 90% of their needs  -met with tube feeds   MONITOR:   Diet advancement,Labs,Weight trends,TF tolerance,Skin,I & O's  ASSESSMENT:   64 y.o73African-American female with medical history significant for osteoarthritis, type 2 diabetes mellitus, hypertension and COVID-19 in September 2021 who is admitted with failure to thrive  Pt s/p IR G-tube 3/4  Pt not tolerating bolus feeds today. Pt complaining of nausea and has had one episode of vomiting after a feed. Pt is refusing every other tube feeding. Will plan to switch pt over to continuous feeds. This will provide her with more consistent nutrition at a lower rate with lower volume. Can consider switching her back to bolus feeds depending on her discharge plan and once she has been tolerating continuous feeds for a while. Pt continues to refeed.   Medications reviewed and include: vitamin C, folic acid, insulin, remeron, protonix, albumin  Labs reviewed: K 4.3 wnl, BUN 24(H), creat 1.77(H), P 2.2(L) Iron 55, ferritin 887(H), folate 5.1(L) Wbc- 10.9(H), Hgb 8.1(L), Hct 23.5(L) cbgs- 154, 110, 171, 159 x 24 hrs  Diet Order:   Diet Order            Diet NPO time specified  Diet effective now                EDUCATION NEEDS:   No education needs have been identified at this time  Skin:  Skin Assessment: Reviewed RN  Assessment (ecchymosis)  Last BM:  3/7- type 7  Height:   Ht Readings from Last 1 Encounters:  12/03/20 5' 5" (1.651 m)    Weight:   Wt Readings from Last 1 Encounters:  12/12/20 126.4 kg    Ideal Body Weight:  56.8 kg  BMI:  Body mass index is 46.38 kg/m.  Estimated Nutritional Needs:   Kcal:  2000-2300kcal/day  Protein:  100-115g/day  Fluid:  1.7L-2.0L/day  Ayani Ospina Koleen DistanceD, LDN Please refer to [facility] and/or RD on-call/weekend/after hours pager

## 2020-12-14 DIAGNOSIS — F32A Depression, unspecified: Secondary | ICD-10-CM | POA: Diagnosis not present

## 2020-12-14 DIAGNOSIS — Z7189 Other specified counseling: Secondary | ICD-10-CM | POA: Diagnosis not present

## 2020-12-14 DIAGNOSIS — Z515 Encounter for palliative care: Secondary | ICD-10-CM | POA: Diagnosis not present

## 2020-12-14 DIAGNOSIS — L8990 Pressure ulcer of unspecified site, unspecified stage: Secondary | ICD-10-CM | POA: Insufficient documentation

## 2020-12-14 LAB — GLUCOSE, CAPILLARY
Glucose-Capillary: 128 mg/dL — ABNORMAL HIGH (ref 70–99)
Glucose-Capillary: 136 mg/dL — ABNORMAL HIGH (ref 70–99)
Glucose-Capillary: 138 mg/dL — ABNORMAL HIGH (ref 70–99)
Glucose-Capillary: 130 mg/dL — ABNORMAL HIGH (ref 70–99)
Glucose-Capillary: 130 mg/dL — ABNORMAL HIGH (ref 70–99)

## 2020-12-14 LAB — RENAL FUNCTION PANEL
Albumin: 3.5 g/dL (ref 3.5–5.0)
Anion gap: 8 (ref 5–15)
BUN: 29 mg/dL — ABNORMAL HIGH (ref 8–23)
CO2: 18 mmol/L — ABNORMAL LOW (ref 22–32)
Calcium: 9.2 mg/dL (ref 8.9–10.3)
Chloride: 110 mmol/L (ref 98–111)
Creatinine, Ser: 1.8 mg/dL — ABNORMAL HIGH (ref 0.44–1.00)
GFR, Estimated: 31 mL/min — ABNORMAL LOW (ref >60)
Glucose, Bld: 122 mg/dL — ABNORMAL HIGH (ref 70–99)
Phosphorus: 2.8 mg/dL (ref 2.5–4.6)
Potassium: 5.8 mmol/L — ABNORMAL HIGH (ref 3.5–5.1)
Sodium: 136 mmol/L (ref 135–145)

## 2020-12-14 MED ORDER — METOCLOPRAMIDE HCL 5 MG/ML IJ SOLN
5.0000 mg | Freq: Four times a day (QID) | INTRAMUSCULAR | Status: DC
  Administered 2020-12-14 – 2020-12-15 (×3): 5 mg via INTRAVENOUS
  Filled 2020-12-14 (×3): qty 2

## 2020-12-14 MED ORDER — FUROSEMIDE 10 MG/ML IJ SOLN: 40.0000 mg | Freq: Once | INTRAMUSCULAR | Status: DC

## 2020-12-14 NOTE — Progress Notes (Signed)
[facility]  ROUNDING NOTE   Subjective:   Patient seen resting in bed Alert and able to answer simple questions Denies shortness of breath Daughter says she has been dry heaving Patient says she feels full with tube feeds  UOP 575 mL  Objective:  Vital signs in last 24 hours:  Temp:  [97.4 F (36.3 C)-98.6 F (37 C)] 97.9 F (36.6 C) (03/09 1132) Pulse Rate:  [101-108] 105 (03/09 1132) Resp:  [16-18] 16 (03/09 1132) BP: (116-138)/(75-93) 138/93 (03/09 1132) SpO2:  [96 %-100 %] 96 % (03/09 1132) Weight:  [124.3 kg] 124.3 kg (03/09 0452)  Weight change:  Filed Weights   12/11/20 0119 12/12/20 0500 12/14/20 0452  Weight: 120.2 kg 126.4 kg 124.3 kg    Intake/Output: I/O last 3 completed shifts: In: 0  Out: 1350 [Urine:1350]   Intake/Output this shift:  No intake/output data recorded.  Physical Exam: General: NAD, laying in bed  Head: Normocephalic, atraumatic. Moist oral mucosal membranes  Eyes: Anicteric, PERRL  Neck: Supple, trachea midline  Lungs:  Clear to auscultation  Heart: Regular rate and rhythm  Abdomen:  +G-tube  Extremities:  ++ peripheral edema.  Neurologic: Able to answer simple questions  Skin: No lesions  GU: Foley with urine    Basic Metabolic Panel: Recent Labs  Lab 12/08/20 0501 12/09/20 0615 12/10/20 0806 12/10/20 1608 12/11/20 0425 12/12/20 0450 12/13/20 0353 12/14/20 0715  NA 141 140 138  --  136 137 136 136  K 3.3* 3.6 2.9* 4.0 3.9 4.2 4.3 5.8*  CL 115* 114* 114*  --  111 111 110 110  CO2 19* 18* 16*  --  18* 19* 20* 18*  GLUCOSE 191* 157* 147*  --  183* 101* 145* 122*  BUN 19 17 16   --  18 20 24* 29*  CREATININE 1.35* 1.29* 1.32*  --  1.52* 1.61* 1.77* 1.80*  CALCIUM 8.5* 8.3* 7.8*  --  8.4* 8.8* 9.1 9.2  MG 1.6* 1.8 1.7  --  2.0 2.1  --   --   PHOS 2.0* 2.4* 2.7  --  2.8 2.3* 2.2* 2.8    Liver Function Tests: Recent Labs  Lab 12/08/20 0501 12/09/20 0615 12/10/20 0806 12/11/20 0425 12/14/20 0715   AST 27 33  --   --   --   ALT 35 41  --   --   --   ALKPHOS 195* 180*  --   --   --   BILITOT 0.6 0.7  --   --   --   PROT 4.3* 4.2*  --   --   --   ALBUMIN 1.8* 1.8* 1.5* 1.6* 3.5   No results for input(s): LIPASE, AMYLASE in the last 168 hours. No results for input(s): AMMONIA in the last 168 hours.  CBC: Recent Labs  Lab 12/08/20 0810 12/08/20 0930 12/09/20 0615 12/09/20 0805 12/10/20 0806 12/11/20 0425 12/13/20 0353  WBC 13.3*  --  13.0*  --   --  11.2* 10.9*  HGB 6.5*   < > 6.4* 6.3* 7.1* 9.3* 8.1*  HCT 19.1*   < > 18.6* 19.1* 20.4* 26.8* 23.5*  MCV 83.8  --  84.5  --   --  83.2 83.3  PLT 214  --  210  --   --  130* 124*   < > = values in this interval not displayed.    Cardiac Enzymes: No results for input(s): CKTOTAL, CKMB, CKMBINDEX, TROPONINI in the last 168 hours.  BNP:  Invalid input(s): POCBNP  CBG: Recent Labs  Lab 12/13/20 2012 12/13/20 2346 12/14/20 0437 12/14/20 0802 12/14/20 1132  GLUCAP 143* 140* 128* 136* 138*    Microbiology: Results for orders placed or performed during the hospital encounter of 12/03/20  Resp Panel by RT-PCR (Flu A&B, Covid) Nasopharyngeal Swab     Status: None   Collection Time: 12/03/20  1:35 PM   Specimen: Nasopharyngeal Swab; Nasopharyngeal(NP) swabs in vial transport medium  Result Value Ref Range Status   SARS Coronavirus 2 by RT PCR NEGATIVE NEGATIVE Final    Comment: (NOTE) SARS-CoV-2 target nucleic acids are NOT DETECTED.  The SARS-CoV-2 RNA is generally detectable in upper respiratory specimens during the acute phase of infection. The lowest concentration of SARS-CoV-2 viral copies this assay can detect is 138 copies/mL. A negative result does not preclude SARS-Cov-2 infection and should not be used as the sole basis for treatment or other patient management decisions. A negative result may occur with  improper specimen collection/handling, submission of specimen other than nasopharyngeal swab, presence of  viral mutation(s) within the areas targeted by this assay, and inadequate number of viral copies(<138 copies/mL). A negative result must be combined with clinical observations, patient history, and epidemiological information. The expected result is Negative.  Fact Sheet for Patients:  BloggerCourse.com  Fact Sheet for Healthcare Providers:  SeriousBroker.it  This test is no t yet approved or cleared by the [place] FDA and  has been authorized for detection and/or diagnosis of SARS-CoV-2 by FDA under an Emergency Use Authorization (EUA). This EUA will remain  in effect (meaning this test can be used) for the duration of the COVID-19 declaration under Section 564(b)(1) of the Act, 21 U.S.C.section 360bbb-3(b)(1), unless the authorization is terminated  or revoked sooner.       Influenza A by PCR NEGATIVE NEGATIVE Final   Influenza B by PCR NEGATIVE NEGATIVE Final    Comment: (NOTE) The Xpert Xpress SARS-CoV-2/FLU/RSV plus assay is intended as an aid in the diagnosis of influenza from Nasopharyngeal swab specimens and should not be used as a sole basis for treatment. Nasal washings and aspirates are unacceptable for Xpert Xpress SARS-CoV-2/FLU/RSV testing.  Fact Sheet for Patients: BloggerCourse.com  Fact Sheet for Healthcare Providers: SeriousBroker.it  This test is not yet approved or cleared by the [place] FDA and has been authorized for detection and/or diagnosis of SARS-CoV-2 by FDA under an Emergency Use Authorization (EUA). This EUA will remain in effect (meaning this test can be used) for the duration of the COVID-19 declaration under Section 564(b)(1) of the Act, 21 U.S.C. section 360bbb-3(b)(1), unless the authorization is terminated or revoked.  Performed at [facility], [address] [place], [place] [address]   CULTURE, BLOOD (ROUTINE X  2) w Reflex to ID Panel     Status: None   Collection Time: 12/06/20 10:55 AM   Specimen: BLOOD  Result Value Ref Range Status   Specimen Description BLOOD LEFT WRIST  Final   Special Requests   Final    BOTTLES DRAWN AEROBIC ONLY Blood Culture adequate volume   Culture   Final    NO GROWTH 5 DAYS Performed at [facility], [address] [place], [place] [address]    Report Status 12/11/2020 FINAL  Final  CULTURE, BLOOD (ROUTINE X 2) w Reflex to ID Panel     Status: None   Collection Time: 12/06/20 11:05 AM   Specimen: BLOOD  Result Value Ref Range Status   Specimen  Description BLOOD RIGHT WRIST  Final   Special Requests   Final    BOTTLES DRAWN AEROBIC ONLY Blood Culture results may not be optimal due to an inadequate volume of blood received in culture bottles   Culture   Final    NO GROWTH 5 DAYS Performed at [facility], [address] [place], [place] [address]    Report Status 12/11/2020 FINAL  Final  MRSA PCR Screening     Status: None   Collection Time: 12/06/20  1:18 PM   Specimen: Nasal Mucosa; Nasopharyngeal  Result Value Ref Range Status   MRSA by PCR NEGATIVE NEGATIVE Final    Comment:        The GeneXpert MRSA Assay (FDA approved for NASAL specimens only), is one component of a comprehensive MRSA colonization surveillance program. It is not intended to diagnose MRSA infection nor to guide or monitor treatment for MRSA infections. Performed at [facility], [address] [place], [place] [address]   Urine Culture     Status: None   Collection Time: 12/06/20  1:18 PM   Specimen: Urine, Random  Result Value Ref Range Status   Specimen Description   Final    URINE, RANDOM Performed at [facility], [address] [place], [place] [address]    Special Requests   Final    NONE Performed at [facility], [address] [place], [place] [address]    Culture   Final    NO GROWTH Performed at [facility], [address] [place], [place] [address]    Report Status 12/08/2020 FINAL  Final    Coagulation Studies: No results for input(s): LABPROT, INR in the last 72 hours.  Urinalysis: No results for input(s): COLORURINE, LABSPEC, PHURINE, GLUCOSEU, HGBUR, BILIRUBINUR, KETONESUR, PROTEINUR, UROBILINOGEN, NITRITE, LEUKOCYTESUR in the last 72 hours.  Invalid input(s): APPERANCEUR    Imaging: DG Chest Port 1 View  Result Date: 12/13/2020 CLINICAL DATA:  Shortness of breath. EXAM: PORTABLE CHEST 1 VIEW COMPARISON:  12/06/2020.  CT 06/21/2020. FINDINGS: Right PICC line noted with tip over mid SVC. Left IJ line tip noted over mid SVC. Heart size stable. Low lung volumes with progressive left base atelectasis. Bilateral interstitial infiltrates again noted without interim change. Tiny left pleural effusion cannot be excluded. No pneumothorax. IMPRESSION: 1. Right PICC line noted with tip over mid SVC. Left IJ line tip noted over mid SVC. 2. Low lung volumes with progressive left base atelectasis. Bilateral interstitial infiltrates again noted without interim change. Electronically Signed   By: Maisie Fushomas  Register   On: 12/13/2020 08:25     Medications:   . sodium chloride Stopped (12/10/20 2201)  . feeding supplement (OSMOLITE 1.5 CAL) 1,000 mL (12/13/20 1744)   . sodium chloride   Intravenous Once  . sodium chloride   Intravenous Once  . amiodarone  200 mg Per Tube BID  . vitamin C  250 mg Per Tube BID  . Chlorhexidine Gluconate Cloth  6 each Topical Daily  . diclofenac Sodium  2 g Topical QID  . DULoxetine  30 mg Oral Daily  . feeding supplement (PROSource TF)  45 mL Per Tube BID  . folic acid  1 mg Per Tube Daily  . free water  100 mL Per Tube 6 X Daily  . insulin aspart  0-9 Units Subcutaneous Q4H  . lidocaine  1 patch Transdermal Q24H  . midodrine  10 mg Per Tube TID WC  . mirtazapine  45 mg Per Tube QHS  . OLANZapine  5 mg Per Tube QHS  . pantoprazole sodium  40 mg Per  Tube BID   sodium chloride, acetaminophen **OR** acetaminophen, fentaNYL (SUBLIMAZE) injection, liver oil-zinc oxide, magnesium hydroxide, ondansetron **OR** ondansetron (ZOFRAN) IV  Assessment/ Plan:  Ms. Chelsea Glass is a 65 y.o. black female with diabetes mellitus type II, hypertension, osteoarthritis, hyperlipidemia who was admitted to [facility] on 12/03/2020 for AKI (acute kidney injury) (HCC) [N17.9] Altered mental status, unspecified altered mental status type [R41.82]  Found to have aspiration pneumonia  1. Acute kidney injury: baseline creatinine of 0.95 with normal GFR >60 on 12/10/13/2019.  Urinalysis with proteinuria and hematuria.  History of diabetic nephropathy.  -Slightly worse BLE edema - Lasix 40mg  IV  2. Hypotension: Holding home metoprolol dose.  - midodrine  3. Hypokalemia: improved with IV fluids.  -current level 5.8 -likely due to the start of continuous tube feeds -Lasix 40 mg IV once  4. Metabolic acidosis:  - IV Lasix ordered  5. Anemia with renal failure: PRBC transfusion 3/4 and 3/6 Currently 8.1 Will continue monitor   LOS: 11   3/9/202211:41 AM

## 2020-12-14 NOTE — Progress Notes (Addendum)
Speech Language Pathology Treatment: Dysphagia  Patient Details Name: Chelsea Glass MRN: [id number] DOB: 12/10/1955 Today's Date: 12/14/2020 Time: 0950-1050 SLP Time Calculation (min) (ACUTE ONLY): 60 min  Assessment / Plan / Recommendation Clinical Impression  Pt seen today for ongoing assessment of swallowing function; trials to encourage oral intake(interest in). Pt received a PEG placement on 12/09/2020 for nutrition/hydration needs d/t FTT in recent months. Pt is improving in her toleration and comfort of the TFs. Daughter present this session. Pt is alert, awake and verbalizes intermittently but often looks at others then nods/shakes head for communication. Psychiatry is following.  Pt appears to present w/ adequate oropharyngeal swallow function; pt appears hampered by her engagement and desire for oral intake. Per MD discussion previously, there may be a component of Psychiatric illness/depression impacting in her engagement in verbal and oral interaction as well as her desire to eat/drink. Pt has been hampered by the "taste" of po's -- "nothing tasting right", per pt report. Daughter stated this occurred since she had Covid months ago.  During this session today, pt presented w/ more engagement w/ this SLP and nodded head to offers of po's w/ encouragement that she needed to eat/drink "some by mouth" in order to support her digestive system and reduce N/V feelings; MD and family are also hoping for D/C soon. She nodded "yes" to po's offered but then seemed unsure when fed to her. She often made a face of dislike w/ po's in her mouth but did not articulate why. Pt was given Mod verbal/visualcues for encouragement during po trials. No overt clinical s/s of aspiration noted during/after po trials: vocal quality clear when assessed, no coughing, no decline in respiratory status w/ po trials. Oral phase appeared grossly [facility] for bolus management -- min prolonged oral phase time during increased  textured trials for full masticating and oral clearing but overall adequate. Pt was primarily disinterested in taking further po's and stopped after few sips and 2 bites of solids. Full feeding support but pt helped to Hold Cup when drinking. No unilateral weakness noted = she often made faces of dislike w/ po's and offers of options.    D/t pt's declined pt's reduced interest in foods and liquids but overall adequate management of few trials when she accepted them, recommend a Regular diet and Thin liquids w/ foods Cut Small at table(for appearance and encouragement to eat/drink); aspiration precautions; reduce Distractions during meals. Support w/ feeding and give encouragement. Supervision w/ po intake. Frequent oral care for hygiene and stimulation Before and After meals.  Much education given to pt and Dtr on general aspiration precautions; food consistencies/options/prep and suggestions on ways to encourage oral intake at home(behaviorl strategies). Recommend possible f/u post D/C for CBT for ongoing stimulation and engagement to engage in oral intake and stimulate the gut and the brain; as well as education for pt/family. Noted Palliative Care ongoing for [place]. Largely suspect that pt's Mental Status and Behaviors could continue to hamper oral intake and meeting of full nutrition/hydration needs via an oral diet. Precautions posted in room. MD agreed. NSG updated.    HPI HPI: Pt is a 65 y.o. African-American female with medical history significant for osteoarthritis, type 2 diabetes mellitus and hypertension, as well as COVID-19 in September 2021, who presented to the emergency room with acute onset of altered mental status and not acting herself for the last 4 days with excessive sleepiness. She has been having nausea and dry heaves without abdominal pain.  Her daughter stated  that she has not eaten much and has been bed bound since September 2021 since her COVID-19 infection.  No dysuria, oliguria or  hematuria or flank pain.  No chest pain or palpitations.  No headache or dizziness or blurred vision.  No paresthesias or focal muscle weakness.  D/t FTT, PEG tube was placed for nutrition/hydration this admit.      SLP Plan  All goals met while inpatient acute       Recommendations  Diet recommendations: Regular;Thin liquid (for all options for pt) Liquids provided via: Cup;Straw (monitor) Medication Administration: Via alternative means (via PEG) Supervision: Staff to assist with self feeding;Full supervision/cueing for compensatory strategies Compensations: Minimize environmental distractions;Slow rate;Small sips/bites;Lingual sweep for clearance of pocketing;Follow solids with liquid Postural Changes and/or Swallow Maneuvers: Seated upright 90 degrees;Upright 30-60 min after meal                General recommendations:  (Dietician f/u; Palliative care for [place]) Oral Care Recommendations: Oral care BID;Patient independent with oral care;Staff/trained caregiver to provide oral care (pt should take part) Follow up Recommendations: None SLP Visit Diagnosis: Dysphagia, unspecified (R13.10) (FTT -- pt often declines po's) Plan: All goals met       GO                 Orinda Kenner, MS, CCC-SLP Speech Language Pathologist Rehab Services [phone] [facility] 12/14/2020, 3:47 PM

## 2020-12-14 NOTE — Progress Notes (Signed)
Report called and given to 1C nurse and writer also called daughter Eather Colas) to update on transfer to room 152.

## 2020-12-14 NOTE — Progress Notes (Signed)
PHARMACY CONSULT NOTE  Pharmacy Consult for Electrolyte Monitoring and Replacement   Recent Labs: Potassium (mmol/L)  Date Value  12/14/2020 5.8 (H)   Magnesium (mg/dL)  Date Value  30/02/1101 2.1   Calcium (mg/dL)  Date Value  08/24/3566 9.2   Albumin (g/dL)  Date Value  01/41/0301 3.5   Phosphorus (mg/dL)  Date Value  31/43/8887 2.8   Sodium (mmol/L)  Date Value  12/14/2020 136   Assessment: 65 year old female with generalized deconditioning and failure to thrive. Dysphagia limiting safe PO intake, plan for G-tube placement. Pharmacy consult for electrolyte replacement.  Expect patient will be at risk for re-feeding when enteral access is established and feeds are started  K+ 5.8 but due to hemolysis.   Goal of Therapy:  K > 4 Mg > 2 All other electrolytes within normal limits  Plan:  No replacement needed at this time.  --Will follow-up electrolytes with AM labs   Paschal Dopp, PharmD,  Clinical Pharmacist 12/14/2020

## 2020-12-14 NOTE — Progress Notes (Signed)
Patient transferred at this time to room 153 with all belongings via bed. Tube feeding intact and increased to 40/hr before leaving floor. Tolerating well.

## 2020-12-14 NOTE — Progress Notes (Signed)
Daily Progress Note   Patient Name: Chelsea Glass       Date: 12/14/2020 DOB: [birth year]  Age: 65 y.o. MRN#: [id number] Attending Physician: Enedina Finner, MD Primary Care Physician: [facility] Admit Date: 12/03/2020  Reason for Consultation/Follow-up: Establishing goals of care  Subjective: Patent is resting in bed with eyes closed, daughter is at bedside. Discussed that she did not tolerate the bolus tube feeds and is on continuous feeds. Daughter states she is being educated on how to use the PEG at home. Daughter is optimistic her mother will improve and regain functional independence, and a sustaining oral intake with increase in appetite. Discussed ways of increasing oral intake and dicussed participating in home PT/OT. She understands her mother's deconditioning. Talked about the need for ongoing discussion of acceptable interventions,  QOL, and any boundaries on care she may have; also discussed need for ongoing discussions on any time limits Chelsea Glass may set to achieve a particular goal.  Length of Stay: 11  Current Medications: Scheduled Meds:  . sodium chloride   Intravenous Once  . sodium chloride   Intravenous Once  . amiodarone  200 mg Per Tube BID  . vitamin C  250 mg Per Tube BID  . Chlorhexidine Gluconate Cloth  6 each Topical Daily  . diclofenac Sodium  2 g Topical QID  . DULoxetine  30 mg Oral Daily  . feeding supplement (PROSource TF)  45 mL Per Tube BID  . folic acid  1 mg Per Tube Daily  . free water  100 mL Per Tube 6 X Daily  . furosemide  40 mg Intravenous Once  . insulin aspart  0-9 Units Subcutaneous Q4H  . lidocaine  1 patch Transdermal Q24H  . midodrine  10 mg Per Tube TID WC  . mirtazapine  45 mg Per Tube QHS  . OLANZapine  5 mg Per  Tube QHS  . pantoprazole sodium  40 mg Per Tube BID    Continuous Infusions: . sodium chloride Stopped (12/10/20 2201)  . feeding supplement (OSMOLITE 1.5 CAL) 1,000 mL (12/13/20 1744)    PRN Meds: sodium chloride, acetaminophen **OR** acetaminophen, fentaNYL (SUBLIMAZE) injection, liver oil-zinc oxide, magnesium hydroxide, ondansetron **OR** ondansetron (ZOFRAN) IV  Physical Exam Constitutional:      Comments: Eyes closed.  Vital Signs: BP (!) 138/93 (BP Location: Right Wrist)   Pulse (!) 105   Temp 97.9 F (36.6 C) (Oral)   Resp 16   Ht 5\' 5"  (1.651 m)   Wt 124.3 kg   SpO2 96%   BMI 45.60 kg/m  SpO2: SpO2: 96 % O2 Device: O2 Device: Room Air O2 Flow Rate: O2 Flow Rate (L/min): 3 L/min  Intake/output summary:   Intake/Output Summary (Last 24 hours) at 12/14/2020 1346 Last data filed at 12/13/2020 2151 Gross per 24 hour  Intake 0 ml  Output 575 ml  Net -575 ml   LBM: Last BM Date: 12/13/20 Baseline Weight: Weight: 95.8 kg Most recent weight: Weight: 124.3 kg         Flowsheet Rows   Flowsheet Row Most Recent Value  Intake Tab   Referral Department Hospitalist  Unit at Time of Referral Med/Surg Unit  Palliative Care Primary Diagnosis Other (Comment)  Date Notified 12/04/20  Palliative Care Type New Palliative care  Reason for referral Clarify Goals of Care  Date of Admission 12/03/20  Date first seen by Palliative Care 12/06/20  # of days Palliative referral response time 2 Day(s)  # of days IP prior to Palliative referral 1  Clinical Assessment   Psychosocial & Spiritual Assessment   Palliative Care Outcomes       Patient Active Problem List   Diagnosis Date Noted  . Pressure injury of skin 12/14/2020  . Altered mental status   . Tachycardia   . Hypotension   . Weight loss   . Adult failure to thrive   . Hypokalemia   . Depression   . AKI (acute kidney injury) (HCC) 12/03/2020  . Postmenopausal bleeding   . Hypertension   . Acute  respiratory failure with hypoxia (HCC) 06/13/2020  . Obesity, Class III, BMI 40-49.9 (morbid obesity) (HCC) 06/13/2020  . Severe sepsis (HCC) 06/13/2020  . New onset atrial fibrillation (HCC) 06/13/2020  . Pneumonia due to COVID-19 virus 06/12/2020    Palliative Care Assessment & Plan     Recommendations/Plan: Full code/full scope.  Palliative to follow at D/C.     Code Status:    Code Status Orders  (From admission, onward)         Start     Ordered   12/09/20 1207  Full code  Continuous        12/09/20 1206        Code Status History    Date Active Date Inactive Code Status Order ID Comments User Context   12/03/2020 2042 12/09/2020 1206 Full Code 02/08/2021  [id number] Arville Care, MD Inpatient   06/12/2020 1804 06/27/2020 2002 Full Code 2003  [id number], MD ED   Advance Care Planning Activity      Prognosis:  Unable to determine    Thank you for allowing the Palliative Medicine Team to assist in the care of this patient.   Total Time 25 min Prolonged Time Billed no      Greater than 50%  of this time was spent counseling and coordinating care related to the above assessment and plan.  Lurene Shadow, NP  Please contact Palliative Medicine Team phone at [phone] for questions and concerns.

## 2020-12-14 NOTE — Progress Notes (Signed)
[facility]  - [place] [facility]   PATIENT NAME: Chelsea Glass    MR#:  [id number]  DATE OF BIRTH:  1956/04/06  SUBJECTIVE:  patient more awake did answer a few questions. Daughter at bedside. Tolerating tube feeding.  REVIEW OF SYSTEMS:   Review of Systems  Unable to perform ROS: Medical condition   Tolerating Diet: peg feeding. Very little oral intake Tolerating PT:   DRUG ALLERGIES:   Allergies  Allergen Reactions  . Lisinopril Swelling    VITALS:  Blood pressure (!) 138/93, pulse (!) 105, temperature 97.9 F (36.6 C), temperature source Oral, resp. rate 16, height 5\' 5"  (1.651 m), weight 124.3 kg, SpO2 96 %.  PHYSICAL EXAMINATION:   Physical Exam  GENERAL:  65 y.o.-year-old patient lying in the bed with no acute distress. Obese LUNGS: Normal breath sounds bilaterally, no wheezing, rales, rhonchi. No use of accessory muscles of respiration.  CARDIOVASCULAR: S1, S2 normal. No murmurs, rubs, or gallops.  ABDOMEN: Soft, nontender, nondistended. Bowel sounds present. PEG+ EXTREMITIES: upper and lower extremity edema NEUROLOGIC: more awake. Grossly nonfocal.   PSYCHIATRIC:  patient is alert and awake. Mood stable affect flat SKIN:  Pressure Injury 12/11/20 Buttocks Bilateral Stage 2 -  Partial thickness loss of dermis presenting as a shallow open injury with a red, pink wound bed without slough. stage 2 on different parts of buttocks generalized, butterfly mepilex in place also und (Active)  12/11/20 0800  Location: Buttocks  Location Orientation: Bilateral  Staging: Stage 2 -  Partial thickness loss of dermis presenting as a shallow open injury with a red, pink wound bed without slough.  Wound Description (Comments): stage 2 on different parts of buttocks generalized, butterfly mepilex in place also under thigh fold  Present on Admission:         LABORATORY PANEL:  CBC Recent Labs  Lab 12/13/20 0353  WBC 10.9*  HGB 8.1*  HCT 23.5*   PLT 124*    Chemistries  Recent Labs  Lab 12/09/20 0615 12/10/20 0806 12/12/20 0450 12/13/20 0353 12/14/20 0715  NA 140   < > 137   < > 136  K 3.6   < > 4.2   < > 5.8*  CL 114*   < > 111   < > 110  CO2 18*   < > 19*   < > 18*  GLUCOSE 157*   < > 101*   < > 122*  BUN 17   < > 20   < > 29*  CREATININE 1.29*   < > 1.61*   < > 1.80*  CALCIUM 8.3*   < > 8.8*   < > 9.2  MG 1.8   < > 2.1  --   --   AST 33  --   --   --   --   ALT 41  --   --   --   --   ALKPHOS 180*  --   --   --   --   BILITOT 0.7  --   --   --   --    < > = values in this interval not displayed.   Cardiac Enzymes No results for input(s): TROPONINI in the last 168 hours. RADIOLOGY:  DG Chest Port 1 View  Result Date: 12/13/2020 CLINICAL DATA:  Shortness of breath. EXAM: PORTABLE CHEST 1 VIEW COMPARISON:  12/06/2020.  CT 06/21/2020. FINDINGS: Right PICC line noted with tip over mid SVC. Left IJ line tip  noted over mid SVC. Heart size stable. Low lung volumes with progressive left base atelectasis. Bilateral interstitial infiltrates again noted without interim change. Tiny left pleural effusion cannot be excluded. No pneumothorax. IMPRESSION: 1. Right PICC line noted with tip over mid SVC. Left IJ line tip noted over mid SVC. 2. Low lung volumes with progressive left base atelectasis. Bilateral interstitial infiltrates again noted without interim change. Electronically Signed   By: Maisie Fus  Register   On: 12/13/2020 08:25   ASSESSMENT AND PLAN:  Chelsea Thompsonis a 65 y.o.African-American femalewith medical history significant forosteoarthritis, type 2 diabetes mellitus and hypertension, as well as COVID-19 in September 2021, who presented to the emergency room with acute onset of altered mental status and not acting herself for the last 4 days with excessive sleepiness.She has been having nausea and dry heaves without abdominal pain. Her daughter stated that she has not eaten much and has been bed bound since  September 2021 since her COVID-19 infection.   Adult failure to thrive/poor PO intake/generalized deconditioning Hypokalemia/depressed mood -- patient came in with ongoing weight loss and poor PO intake per daughter. Since COVID infection in September 2021 per daughter patient has been going downhill. -- Daughter says patient has lost more than hundred pounds since September 2021. She has not ambulated and no interest in any acitivity. Stays in bed most of the time --s/p PEG tube placement on 3/4 --3/9-- to feeding resume. Tolerating okay. Patient did work with speech therapy. She is inconsistent with her PO intake. Daughter at bedside learning to manage peg feeding. Will discuss with dietitian regarding bolus feedings since patient's daughter is considering taking her home. -- Daughter recommended to continue encourage oral intake.  Hypotension with sinus tachycardia, acute renal failure --AKI improved with ivf -- patient received three doses of IV albumin -- creatinine 1.8  Atrial fib-rvr --Was started on amiodarone infusion ...>remains in NSR>> now on PO amiodarone 200 mg bid Cardiology consult with Dr Welton Flakes --3/9-- not a candidate for chronic anticoagulation due to drop in hemoglobin. Heparin drip discontinued.  Acute drop in H/H GIB -- Seen by G.I. No G.I. workup recommended at present. Continue to monitor labs. 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 Neuro- recommends discussed with neurosurgery. --3/1--per Dr Adriana Simas "This looks rather stable compared to CT from previous. This is likely benign, possible even cystic. She would need a contrasted image to delineate. No urgent intervention needed as we know it has been there for some time. Unrelated to current issues and wont cause encephalopathy 3/2-f/u neurosurgery as outpt to discuss options going forward once she recovers from acute illness. No intervention recommended at this time.  Depressed mood -- continue Cymbalta, remeron and zyprexa -- psych consult with Dr. Toni Amend.   Type II diabetes with sugars controlled -- sliding scale insulin -- hold PO diabetes meds  Generalized deconditioning -- PT evaluation noted--rec SNF however dter would like to take her home  Palliative following --appreciate input  Nutrition Status: Nutrition Problem: Inadequate oral intake Etiology: lethargy/confusion Signs/Symptoms: NPO status   Pressure Injury 12/11/20 Buttocks Bilateral  Stage 2 -  Partial thickness loss of dermis presenting as a shallow open injury with a red, pink wound bed without slough. stage 2 on different parts of buttocks generalized, butterfly mepilex in place also und (Active)  12/11/20 0800  Location: Buttocks  Location Orientation: Bilateral  Staging: Stage 2 -  Partial thickness loss of dermis presenting as a shallow open injury with a red, pink wound bed without slough.  Wound Description (Comments): stage 2 on different parts of buttocks generalized, butterfly mepilex in place also under thigh fold  Present on Admission:     DVT prophylaxis: scd.  No Lovenox due to GI bleed Code Status:full Family Communication: daugter updated at bedside  Status is: Inpatient Patient is inpatient due to severity of her illness needing hospitalization , need IV treatments  Dispo: The patient is from: Home  Anticipated d/c is to: SNF   Patient currently is not medically stable to d/c.              Difficult to place patient No                anticipated discharge date:>3 days Pt UO is low, anemic, getting, iv albumin, renal function worsening advancing tube feeding slowly.        TOTAL TIME TAKING CARE OF THIS PATIENT: *25* minutes.  >50% time spent on counselling and coordination of care  Note: This dictation was prepared with Dragon dictation along with smaller phrase technology. Any transcriptional errors that result from this process are unintentional.  Enedina Finner M.D    [facility]   CC: Primary care physician; Inc, [facility] ServicesPatient ID: Chelsea Glass, female   DOB: 1956/07/14, 65 y.o.   MRN: [id number]

## 2020-12-14 NOTE — TOC Progression Note (Signed)
Transition of Care [facility]) - Progression Note    Patient Details  Name: Chelsea Glass MRN: [id number] Date of Birth: September 16, 1956  [facility]) CM/SW Contact  Hetty Ely, RN Phone Number: 12/14/2020, 3:34 PM  Clinical Narrative:   Spoke with patients daughter who was at bedside about discharge needs, DME, HH. Daughter states they have everything to include hoyer lift. HH Services by [facility]. I did call [facility], spoke with Elnita Maxwell who comfirmed that patient is currently receiving HHPT/OT and to have Attending include to write discharge orders.to continue [facility] PT/OT, I also recommend RN, Elnita Maxwell says they can add service if ordered.    Expected Discharge Plan: Home w Home Health Services Barriers to Discharge: Continued Medical Work up  Expected Discharge Plan and Services Expected Discharge Plan: Home w Home Health Services   Discharge Planning Services: CM Consult   Living arrangements for the past 2 months: Single Family Home                           HH Arranged: PT [facility] Agency: [facility] Date [facility] Agency Contacted: 12/05/20   Representative spoke with at [facility] Agency: Elnita Maxwell   Social Determinants of Health (SDOH) Interventions    Readmission Risk Interventions No flowsheet data found.

## 2020-12-15 LAB — GLUCOSE, CAPILLARY
Glucose-Capillary: 111 mg/dL — ABNORMAL HIGH (ref 70–99)
Glucose-Capillary: 134 mg/dL — ABNORMAL HIGH (ref 70–99)
Glucose-Capillary: 141 mg/dL — ABNORMAL HIGH (ref 70–99)
Glucose-Capillary: 147 mg/dL — ABNORMAL HIGH (ref 70–99)
Glucose-Capillary: 150 mg/dL — ABNORMAL HIGH (ref 70–99)
Glucose-Capillary: 152 mg/dL — ABNORMAL HIGH (ref 70–99)

## 2020-12-15 LAB — PHOSPHORUS: Phosphorus: 2.8 mg/dL (ref 2.5–4.6)

## 2020-12-15 LAB — BASIC METABOLIC PANEL
Anion gap: 9 (ref 5–15)
BUN: 33 mg/dL — ABNORMAL HIGH (ref 8–23)
CO2: 19 mmol/L — ABNORMAL LOW (ref 22–32)
Calcium: 8.9 mg/dL (ref 8.9–10.3)
Chloride: 108 mmol/L (ref 98–111)
Creatinine, Ser: 1.93 mg/dL — ABNORMAL HIGH (ref 0.44–1.00)
GFR, Estimated: 28 mL/min — ABNORMAL LOW (ref >60)
Glucose, Bld: 116 mg/dL — ABNORMAL HIGH (ref 70–99)
Potassium: 5.6 mmol/L — ABNORMAL HIGH (ref 3.5–5.1)
Sodium: 136 mmol/L (ref 135–145)

## 2020-12-15 LAB — MAGNESIUM: Magnesium: 2.1 mg/dL (ref 1.7–2.4)

## 2020-12-15 MED ORDER — TRAMADOL HCL 50 MG PO TABS
50.0000 mg | ORAL_TABLET | Freq: Four times a day (QID) | ORAL | Status: DC | PRN
  Administered 2020-12-15 – 2020-12-16 (×2): 50 mg via ORAL
  Filled 2020-12-15 (×3): qty 1

## 2020-12-15 MED ORDER — AMIODARONE HCL 200 MG PO TABS
200.0000 mg | ORAL_TABLET | Freq: Every day | ORAL | Status: DC
  Administered 2020-12-16: 200 mg via ORAL
  Filled 2020-12-15: qty 1

## 2020-12-15 MED ORDER — METOCLOPRAMIDE HCL 5 MG/5ML PO SOLN: 5.0000 mg | Freq: Three times a day (TID) | ORAL | Status: DC

## 2020-12-15 MED ORDER — MIRTAZAPINE 15 MG PO TBDP
45.0000 mg | ORAL_TABLET | Freq: Every day | ORAL | Status: DC
  Administered 2020-12-15: 45 mg via ORAL
  Filled 2020-12-15 (×2): qty 3

## 2020-12-15 MED ORDER — SODIUM ZIRCONIUM CYCLOSILICATE 5 G PO PACK
5.0000 g | PACK | Freq: Once | ORAL | Status: AC
  Administered 2020-12-15: 5 g via ORAL
  Filled 2020-12-15: qty 1

## 2020-12-15 MED ORDER — FUROSEMIDE 10 MG/ML IJ SOLN: 40.0000 mg | Freq: Once | INTRAMUSCULAR | Status: DC

## 2020-12-15 MED ORDER — METOCLOPRAMIDE HCL 5 MG/5ML PO SOLN
5.0000 mg | Freq: Three times a day (TID) | ORAL | Status: DC
  Filled 2020-12-15 (×7): qty 5

## 2020-12-15 MED ORDER — METOPROLOL SUCCINATE ER 25 MG PO TB24
25.0000 mg | ORAL_TABLET | Freq: Every day | ORAL | Status: DC
  Administered 2020-12-15 – 2020-12-16 (×2): 25 mg via ORAL
  Filled 2020-12-15 (×2): qty 1

## 2020-12-15 MED ORDER — POLYETHYLENE GLYCOL 3350 17 G PO PACK
17.0000 g | PACK | Freq: Two times a day (BID) | ORAL | Status: DC
  Administered 2020-12-15 – 2020-12-16 (×2): 17 g
  Filled 2020-12-15 (×3): qty 1

## 2020-12-15 NOTE — Plan of Care (Addendum)
PMT note:  Patient is resting in bed with daughter at bedside. Plans for D/C soon. Discussed need for ongoing GOC and recommendation for outpatient palliative. She is amenable.   No charge.

## 2020-12-15 NOTE — Progress Notes (Addendum)
[facility]  ROUNDING NOTE   Subjective:   Patient seen resting in bed Alert and able to answer simple questions Says she feel better today Denies current nausea Denies shortness of breath   Tube Feeds @ 40 ml/hr  Objective:  Vital signs in last 24 hours:  Temp:  [97.5 F (36.4 C)-97.9 F (36.6 C)] 97.7 F (36.5 C) (03/10 0747) Pulse Rate:  [100-108] 102 (03/10 0747) Resp:  [15-18] 18 (03/10 0747) BP: (116-149)/(74-97) 139/94 (03/10 0747) SpO2:  [95 %-100 %] 100 % (03/10 0747)  Weight change:  Filed Weights   12/11/20 0119 12/12/20 0500 12/14/20 0452  Weight: 120.2 kg 126.4 kg 124.3 kg    Intake/Output: I/O last 3 completed shifts: In: 1396 [NG/GT:1396] Out: 375 [Urine:375]   Intake/Output this shift:  No intake/output data recorded.  Physical Exam: General: NAD, laying in bed  Head: Normocephalic, atraumatic. Moist oral mucosal membranes  Eyes: Anicteric,  Neck: Supple, trachea midline  Lungs:  Clear to auscultation  Heart: Regular rate and rhythm  Abdomen:  +G-tube  Extremities:  ++ peripheral edema.  Neurologic: Able to answer simple questions  Skin: No lesions       Basic Metabolic Panel: Recent Labs  Lab 12/09/20 0615 12/10/20 0806 12/10/20 1608 12/11/20 0425 12/12/20 0450 12/13/20 0353 12/14/20 0715 12/15/20 0539  NA 140 138  --  136 137 136 136 136  K 3.6 2.9*   < > 3.9 4.2 4.3 5.8* 5.6*  CL 114* 114*  --  111 111 110 110 108  CO2 18* 16*  --  18* 19* 20* 18* 19*  GLUCOSE 157* 147*  --  183* 101* 145* 122* 116*  BUN 17 16  --  18 20 24* 29* 33*  CREATININE 1.29* 1.32*  --  1.52* 1.61* 1.77* 1.80* 1.93*  CALCIUM 8.3* 7.8*  --  8.4* 8.8* 9.1 9.2 8.9  MG 1.8 1.7  --  2.0 2.1  --   --  2.1  PHOS 2.4* 2.7  --  2.8 2.3* 2.2* 2.8 2.8   < > = values in this interval not displayed.    Liver Function Tests: Recent Labs  Lab 12/09/20 0615 12/10/20 0806 12/11/20 0425 12/14/20 0715  AST 33  --   --   --   ALT 41  --   --    --   ALKPHOS 180*  --   --   --   BILITOT 0.7  --   --   --   PROT 4.2*  --   --   --   ALBUMIN 1.8* 1.5* 1.6* 3.5   No results for input(s): LIPASE, AMYLASE in the last 168 hours. No results for input(s): AMMONIA in the last 168 hours.  CBC: Recent Labs  Lab 12/09/20 0615 12/09/20 0805 12/10/20 0806 12/11/20 0425 12/13/20 0353  WBC 13.0*  --   --  11.2* 10.9*  HGB 6.4* 6.3* 7.1* 9.3* 8.1*  HCT 18.6* 19.1* 20.4* 26.8* 23.5*  MCV 84.5  --   --  83.2 83.3  PLT 210  --   --  130* 124*    Cardiac Enzymes: No results for input(s): CKTOTAL, CKMB, CKMBINDEX, TROPONINI in the last 168 hours.  BNP: Invalid input(s): POCBNP  CBG: Recent Labs  Lab 12/14/20 1132 12/14/20 1553 12/14/20 2222 12/15/20 0523 12/15/20 0745  GLUCAP 138* 130* 130* 134* 111*    Microbiology: Results for orders placed or performed during the hospital encounter of 12/03/20  Resp Panel by  RT-PCR (Flu A&B, Covid) Nasopharyngeal Swab     Status: None   Collection Time: 12/03/20  1:35 PM   Specimen: Nasopharyngeal Swab; Nasopharyngeal(NP) swabs in vial transport medium  Result Value Ref Range Status   SARS Coronavirus 2 by RT PCR NEGATIVE NEGATIVE Final    Comment: (NOTE) SARS-CoV-2 target nucleic acids are NOT DETECTED.  The SARS-CoV-2 RNA is generally detectable in upper respiratory specimens during the acute phase of infection. The lowest concentration of SARS-CoV-2 viral copies this assay can detect is 138 copies/mL. A negative result does not preclude SARS-Cov-2 infection and should not be used as the sole basis for treatment or other patient management decisions. A negative result may occur with  improper specimen collection/handling, submission of specimen other than nasopharyngeal swab, presence of viral mutation(s) within the areas targeted by this assay, and inadequate number of viral copies(<138 copies/mL). A negative result must be combined with clinical observations, patient history,  and epidemiological information. The expected result is Negative.  Fact Sheet for Patients:  BloggerCourse.com  Fact Sheet for Healthcare Providers:  SeriousBroker.it  This test is no t yet approved or cleared by the [place] FDA and  has been authorized for detection and/or diagnosis of SARS-CoV-2 by FDA under an Emergency Use Authorization (EUA). This EUA will remain  in effect (meaning this test can be used) for the duration of the COVID-19 declaration under Section 564(b)(1) of the Act, 21 U.S.C.section 360bbb-3(b)(1), unless the authorization is terminated  or revoked sooner.       Influenza A by PCR NEGATIVE NEGATIVE Final   Influenza B by PCR NEGATIVE NEGATIVE Final    Comment: (NOTE) The Xpert Xpress SARS-CoV-2/FLU/RSV plus assay is intended as an aid in the diagnosis of influenza from Nasopharyngeal swab specimens and should not be used as a sole basis for treatment. Nasal washings and aspirates are unacceptable for Xpert Xpress SARS-CoV-2/FLU/RSV testing.  Fact Sheet for Patients: BloggerCourse.com  Fact Sheet for Healthcare Providers: SeriousBroker.it  This test is not yet approved or cleared by the [place] FDA and has been authorized for detection and/or diagnosis of SARS-CoV-2 by FDA under an Emergency Use Authorization (EUA). This EUA will remain in effect (meaning this test can be used) for the duration of the COVID-19 declaration under Section 564(b)(1) of the Act, 21 U.S.C. section 360bbb-3(b)(1), unless the authorization is terminated or revoked.  Performed at [facility], [address] [place], [place] [address]   CULTURE, BLOOD (ROUTINE X 2) w Reflex to ID Panel     Status: None   Collection Time: 12/06/20 10:55 AM   Specimen: BLOOD  Result Value Ref Range Status   Specimen Description BLOOD LEFT WRIST  Final   Special Requests    Final    BOTTLES DRAWN AEROBIC ONLY Blood Culture adequate volume   Culture   Final    NO GROWTH 5 DAYS Performed at [facility], [address] [place], [place] [address]    Report Status 12/11/2020 FINAL  Final  CULTURE, BLOOD (ROUTINE X 2) w Reflex to ID Panel     Status: None   Collection Time: 12/06/20 11:05 AM   Specimen: BLOOD  Result Value Ref Range Status   Specimen Description BLOOD RIGHT WRIST  Final   Special Requests   Final    BOTTLES DRAWN AEROBIC ONLY Blood Culture results may not be optimal due to an inadequate volume of blood received in culture bottles   Culture   Final  NO GROWTH 5 DAYS Performed at [facility], [address] [place], [place] [address]    Report Status 12/11/2020 FINAL  Final  MRSA PCR Screening     Status: None   Collection Time: 12/06/20  1:18 PM   Specimen: Nasal Mucosa; Nasopharyngeal  Result Value Ref Range Status   MRSA by PCR NEGATIVE NEGATIVE Final    Comment:        The GeneXpert MRSA Assay (FDA approved for NASAL specimens only), is one component of a comprehensive MRSA colonization surveillance program. It is not intended to diagnose MRSA infection nor to guide or monitor treatment for MRSA infections. Performed at [facility], [address], [place], [place] [address]   Urine Culture     Status: None   Collection Time: 12/06/20  1:18 PM   Specimen: Urine, Random  Result Value Ref Range Status   Specimen Description   Final    URINE, RANDOM Performed at [facility] [address] [place], [place] [address]    Special Requests   Final    NONE Performed at [facility], [address], [place], [place] [address]    Culture   Final    NO GROWTH Performed at [facility], [address] [place], [place] [address]    Report Status 12/08/2020 FINAL  Final    Coagulation Studies: No results for input(s): LABPROT, INR in the last 72  hours.  Urinalysis: No results for input(s): COLORURINE, LABSPEC, PHURINE, GLUCOSEU, HGBUR, BILIRUBINUR, KETONESUR, PROTEINUR, UROBILINOGEN, NITRITE, LEUKOCYTESUR in the last 72 hours.  Invalid input(s): APPERANCEUR    Imaging: No results found.   Medications:   . sodium chloride Stopped (12/10/20 2201)  . feeding supplement (OSMOLITE 1.5 CAL) 1,000 mL (12/13/20 1744)   . sodium chloride   Intravenous Once  . sodium chloride   Intravenous Once  . amiodarone  200 mg Per Tube BID  . vitamin C  250 mg Per Tube BID  . Chlorhexidine Gluconate Cloth  6 each Topical Daily  . diclofenac Sodium  2 g Topical QID  . DULoxetine  30 mg Oral Daily  . feeding supplement (PROSource TF)  45 mL Per Tube BID  . folic acid  1 mg Per Tube Daily  . free water  100 mL Per Tube 6 X Daily  . insulin aspart  0-9 Units Subcutaneous Q4H  . lidocaine  1 patch Transdermal Q24H  . metoCLOPramide (REGLAN) injection  5 mg Intravenous Q6H  . midodrine  10 mg Per Tube TID WC  . mirtazapine  45 mg Per Tube QHS  . OLANZapine  5 mg Per Tube QHS  . pantoprazole sodium  40 mg Per Tube BID  . polyethylene glycol  17 g Per Tube BID   sodium chloride, acetaminophen **OR** acetaminophen, fentaNYL (SUBLIMAZE) injection, liver oil-zinc oxide, magnesium hydroxide, ondansetron **OR** ondansetron (ZOFRAN) IV, traMADol  Assessment/ Plan:  Ms. Chelsea Glass is a 65 y.o. black female with diabetes mellitus type II, hypertension, osteoarthritis, hyperlipidemia who was admitted to [facility] on 12/03/2020 for AKI (acute kidney injury) (HCC) [N17.9] Altered mental status, unspecified altered mental status type [R41.82]  Found to have aspiration pneumonia  1. Acute kidney injury: baseline creatinine of 0.95 with normal GFR >60 on 12/10/13/2019.  Urinalysis with proteinuria and hematuria.  History of diabetic nephropathy.  - BLE edema - Lasix 40mg  IV given yesterday - Creatinine 1.9  2. Hypotension: Holding home metoprolol  dose.  - midodrine  3. Hypokalemia: improved with IV fluids.  -current level 5.6 -likely due to the start of continuous tube feeds -Lokelma ordered by primary team  4. Metabolic acidosis:  - IV Lasix   5. Anemia with renal failure: PRBC transfusion 3/4 and 3/6 Currently 8.1 Will continue monitor   LOS: 12 Chelsea Glass 3/10/20229:53 AM

## 2020-12-15 NOTE — Care Management Important Message (Signed)
Important Message  Patient Details  Name: Chelsea Glass MRN: [id number] Date of Birth: 07/29/1956   Medicare Important Message Given:  Yes     Olegario Messier A Lorik Guo 12/15/2020, 2:13 PM

## 2020-12-15 NOTE — Progress Notes (Signed)
SUBJECTIVE: Patient resting comfortably in bed.  Patient verbalized that she is not in any discomfort, denies shortness of breath, and denies chest pain.   Vitals:   12/15/20 0200 12/15/20 0345 12/15/20 0747 12/15/20 1157  BP: 127/88 120/82 (!) 139/94 121/84  Pulse: (!) 108 100 (!) 102 (!) 103  Resp: 18 15 18 19   Temp: 97.9 F (36.6 C) 97.7 F (36.5 C) 97.7 F (36.5 C) 97.9 F (36.6 C)  TempSrc: Oral Oral  Oral  SpO2: 95% 99% 100% 91%  Weight:      Height:        Intake/Output Summary (Last 24 hours) at 12/15/2020 1253 Last data filed at 12/15/2020 1033 Gross per 24 hour  Intake 1396 ml  Output -  Net 1396 ml    LABS: Basic Metabolic Panel: Recent Labs    12/14/20 0715 12/15/20 0539  NA 136 136  K 5.8* 5.6*  CL 110 108  CO2 18* 19*  GLUCOSE 122* 116*  BUN 29* 33*  CREATININE 1.80* 1.93*  CALCIUM 9.2 8.9  MG  --  2.1  PHOS 2.8 2.8   Liver Function Tests: Recent Labs    12/14/20 0715  ALBUMIN 3.5   No results for input(s): LIPASE, AMYLASE in the last 72 hours. CBC: Recent Labs    12/13/20 0353  WBC 10.9*  HGB 8.1*  HCT 23.5*  MCV 83.3  PLT 124*   Cardiac Enzymes: No results for input(s): CKTOTAL, CKMB, CKMBINDEX, TROPONINI in the last 72 hours. BNP: Invalid input(s): POCBNP D-Dimer: No results for input(s): DDIMER in the last 72 hours. Hemoglobin A1C: No results for input(s): HGBA1C in the last 72 hours. Fasting Lipid Panel: No results for input(s): CHOL, HDL, LDLCALC, TRIG, CHOLHDL, LDLDIRECT in the last 72 hours. Thyroid Function Tests: No results for input(s): TSH, T4TOTAL, T3FREE, THYROIDAB in the last 72 hours.  Invalid input(s): FREET3 Anemia Panel: No results for input(s): VITAMINB12, FOLATE, FERRITIN, TIBC, IRON, RETICCTPCT in the last 72 hours.   PHYSICAL EXAM General: Well developed, well nourished, in no acute distress HEENT:  Normocephalic and atramatic Neck:  No JVD.  Lungs: Clear bilaterally to auscultation and  percussion. Heart: ST . Normal S1 and S2 without gallops or murmurs.  Abdomen: Bowel sounds are positive, abdomen soft and non-tender  Msk:  Back normal, normal gait. Normal strength and tone for age. Extremities: No clubbing, cyanosis or edema.   Neuro: Alert and oriented X 3. Psych:  Good affect, responds appropriately  TELEMETRY: Not on Telemetry  ASSESSMENT AND PLAN: Patient presenting to the emergency department with altered mental status later developed most likely aspiration pneumonia and atrial fibrillation with RVR.Patient remains in sinus rhythm..  Plan to decrease amiodarone dose to 200 mg daily.  Patient is now becoming tachycardic.  Previous home dosing of metoprolol had been held due to hypotension.  Patient no longer hypotensive therefore will resume metoprolol succinate at lower dosing of 25 mg daily.  Hemoglobin continues to be variable.  FOBT positive but plan is to complete endoscopic procedures as an outpatient.  With unknown source of possible GI bleed we will continue to hold any anticoagulants.  From a cardiac point of view the patient is stable and we will sign off now.  Recommend follow-up at our office within 2 weeks of discharge.  Plan discussed with daughter at bedside  Principal Problem:   Depression Active Problems:   AKI (acute kidney injury) (HCC)   Adult failure to thrive   Hypokalemia  Weight loss   Altered mental status   Tachycardia   Hypotension   Pressure injury of skin    Maryelizabeth Kaufmann, NP-C 12/15/2020 12:53 PM

## 2020-12-15 NOTE — Progress Notes (Signed)
[facility]  - [place] [facility]   PATIENT NAME: Chelsea Glass    MR#:  [id number]  DATE OF BIRTH:  1956-10-05  SUBJECTIVE:  patient more awake did answer a few questions. Daughter at bedside. Tolerating tube feeding. No chest pain. Patient has depend on upper and lower extremity edema.  REVIEW OF SYSTEMS:   Review of Systems  Constitutional: Negative for chills, fever and weight loss.  HENT: Negative for ear discharge, ear pain and nosebleeds.   Eyes: Negative for blurred vision, pain and discharge.  Respiratory: Negative for sputum production, shortness of breath, wheezing and stridor.   Cardiovascular: Negative for chest pain, palpitations, orthopnea and PND.  Gastrointestinal: Negative for abdominal pain, diarrhea, nausea and vomiting.  Genitourinary: Negative for frequency and urgency.  Musculoskeletal: Negative for back pain and joint pain.  Neurological: Positive for weakness. Negative for sensory change, speech change and focal weakness.  Psychiatric/Behavioral: Negative for depression and hallucinations. The patient is not nervous/anxious.    Tolerating Diet: peg feeding. Very little oral intake Tolerating PT:   DRUG ALLERGIES:   Allergies  Allergen Reactions  . Lisinopril Swelling    VITALS:  Blood pressure 121/84, pulse (!) 103, temperature 97.9 F (36.6 C), temperature source Oral, resp. rate 19, height 5\' 5"  (1.651 m), weight 124.3 kg, SpO2 91 %.  PHYSICAL EXAMINATION:   Physical Exam  GENERAL:  65 y.o.-year-old patient lying in the bed with no acute distress. Obese LUNGS: Normal breath sounds bilaterally, no wheezing, rales, rhonchi. No use of accessory muscles of respiration.  CARDIOVASCULAR: S1, S2 normal. No murmurs, rubs, or gallops.  ABDOMEN: Soft, nontender, nondistended. Bowel sounds present. PEG+ EXTREMITIES: upper and lower extremity dependent edema NEUROLOGIC: more awake. Grossly nonfocal.   PSYCHIATRIC:  patient is  alert and awake. Mood stable affect flat SKIN:  Pressure Injury 12/11/20 Buttocks Bilateral Stage 2 -  Partial thickness loss of dermis presenting as a shallow open injury with a red, pink wound bed without slough. stage 2 on different parts of buttocks generalized, butterfly mepilex in place also und (Active)  12/11/20 0800  Location: Buttocks  Location Orientation: Bilateral  Staging: Stage 2 -  Partial thickness loss of dermis presenting as a shallow open injury with a red, pink wound bed without slough.  Wound Description (Comments): stage 2 on different parts of buttocks generalized, butterfly mepilex in place also under thigh fold  Present on Admission:    LABORATORY PANEL:  CBC Recent Labs  Lab 12/13/20 0353  WBC 10.9*  HGB 8.1*  HCT 23.5*  PLT 124*    Chemistries  Recent Labs  Lab 12/09/20 0615 12/10/20 0806 12/15/20 0539  NA 140   < > 136  K 3.6   < > 5.6*  CL 114*   < > 108  CO2 18*   < > 19*  GLUCOSE 157*   < > 116*  BUN 17   < > 33*  CREATININE 1.29*   < > 1.93*  CALCIUM 8.3*   < > 8.9  MG 1.8   < > 2.1  AST 33  --   --   ALT 41  --   --   ALKPHOS 180*  --   --   BILITOT 0.7  --   --    < > = values in this interval not displayed.   Cardiac Enzymes No results for input(s): TROPONINI in the last 168 hours. RADIOLOGY:  No results found. ASSESSMENT AND PLAN:  Chelsea Thompsonis  a 65 y.o.African-American femalewith medical history significant forosteoarthritis, type 2 diabetes mellitus and hypertension, as well as COVID-19 in September 2021, who presented to the emergency room with acute onset of altered mental status and not acting herself for the last 4 days with excessive sleepiness.She has been having nausea and dry heaves without abdominal pain. Her daughter stated that she has not eaten much and has been bed bound since September 2021 since her COVID-19 infection.   Adult failure to thrive/poor PO intake/generalized  deconditioning Hypokalemia/depressed mood -- patient came in with ongoing weight loss and poor PO intake per daughter. Since COVID infection in September 2021 per daughter patient has been going downhill. -- Daughter says patient has lost more than hundred pounds since September 2021. She has not ambulated and no interest in any acitivity. Stays in bed most of the time --s/p PEG tube placement on 3/4 --3/9-- to feeding resume. Tolerating okay. Patient did work with speech therapy. She is inconsistent with her PO intake. Daughter at bedside learning to manage peg feeding. Will discuss with dietitian regarding bolus feedings since patient's daughter is considering taking her home. -- d/w Daughter and  recommended to continue encourage oral intake. --3/10--PEG at 50cc/min. dter at bed side going to help pt eat lunch. Pt in better spirits. Discussed do some mobility exercise in bed  Hypotension with sinus tachycardia, acute renal failure --AKI improved with ivf -- patient received three doses of IV albumin -- creatinine 1.8 --d/w Dr Wynelle Link to hold lasix given rising creat. Pt has dependent edema and she needs to mobilize a bit more--d/w dter  Atrial fib-rvr --Was started on amiodarone infusion ...>remains in NSR>> now on PO amiodarone 200 mg bid --Cardiology consult with Dr Welton Flakes --3/9-- not a candidate for chronic anticoagulation due to drop in hemoglobin. Heparin drip discontinued. --HR stable  Acute drop in H/H GIB -- Seen by G.I. No G.I. workup recommended at present. Continue to monitor labs. -- Continue Protonix -- patient is status post two unit blood transfusion during hospitalization -- hemoglobin 8.1  Decrease Urine Output/AKI Anasarca --Nephrology following --Creatinine continues to rise, today  1.29>>>1.52>>1.61>>1.77>>1.8 --Urinalysis with proteinuria hematuria --Completed last dose of albumin ordered by nephrology --Low threshold for IV diuretics, but will hold off at  this time based on increased urine output and increasing creat. 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 Neuro- recommends discussed with neurosurgery. --3/1--per Dr Adriana Simas "This looks rather stable compared to CT from previous. This is likely benign, possible even cystic. She would need a contrasted image to delineate. No urgent intervention needed as we know it has been there for some time. Unrelated to current issues and wont cause encephalopathy 3/2-f/u neurosurgery as outpt to discuss options going forward once she recovers from acute illness. No intervention recommended at this time.  Depressed mood -- continue Cymbalta, remeron and zyprexa -- psych consult with Dr. Toni Amend.   Type II diabetes with sugars controlled -- sliding scale insulin -- hold PO diabetes meds  Generalized deconditioning -- PT evaluation noted--rec SNF however dter would like to take her home  Palliative following --appreciate input  Nutrition Status: Nutrition Problem: Inadequate oral intake Etiology: lethargy/confusion Signs/Symptoms: NPO status   Pressure Injury 12/11/20 Buttocks Bilateral Stage 2 -  Partial thickness loss of dermis presenting as a shallow open injury with a red, pink wound bed  without slough. stage 2 on different parts of buttocks generalized, butterfly mepilex in place also und (Active)  12/11/20 0800  Location: Buttocks  Location Orientation: Bilateral  Staging: Stage 2 -  Partial thickness loss of dermis presenting as a shallow open injury with a red, pink wound bed without slough.  Wound Description (Comments): stage 2 on different parts of buttocks generalized, butterfly mepilex in place also under thigh fold  Present on Admission:     DVT prophylaxis: scd.  No Lovenox due to GI bleed Code  Status:full Family Communication: daugter updated at bedside  Status is: Inpatient Patient is inpatient due to severity of her illness needing hospitalization , need IV treatments  Dispo: The patient is from: Home  Anticipated d/c is to: home with [facility]  Patient currently is medically stable to d/c.              Difficult to place patient No                anticipated discharge date:12/17/18  Patient has come along ways!! Discussed at length regarding discharge plan, tube feeding, exercise/PT, prevention of bedsores, oral intake and encouraging patient to do all of the above at length with patient's daughter in the room and patient. Daughter understands patient is at a high risk for readmission.   0Will discharge her tomorrow via EMS bonds goal rate for to feeding is reached between 55 to 60cc /min    TOTAL TIME TAKING CARE OF THIS PATIENT: *25* minutes.  >50% time spent on counselling and coordination of care  Note: This dictation was prepared with Dragon dictation along with smaller phrase technology. Any transcriptional errors that result from this process are unintentional.  Enedina Finner M.D    [facility]   CC: Primary care physician; [facility], [facility] ServicesPatient ID: Shereda Graw, female   DOB: 06-Jun-1956, 65 y.o.   MRN: [id number]

## 2020-12-15 NOTE — Progress Notes (Signed)
Patient c/o epigastric discomfort. No SOB/chest pressure reported. Administered Reglan as scheduled. Pt then c/o migraine-like headache. Obtained ordered for Tramadol and administered. Now pt c/o nausea. Gave Zofran IV. Reports decrease in nausea, but also now c/o fullness in stomach. RN stopped tube feed (64mls/hr) to help alleviate sensation. Will report to provider and continue to monitor for improvement.

## 2020-12-15 NOTE — Progress Notes (Signed)
PHARMACY CONSULT NOTE  Pharmacy Consult for Electrolyte Monitoring and Replacement   Recent Labs: Potassium (mmol/L)  Date Value  12/15/2020 5.6 (H)   Magnesium (mg/dL)  Date Value  78/46/9629 2.1   Calcium (mg/dL)  Date Value  52/84/1324 8.9   Albumin (g/dL)  Date Value  40/07/2724 3.5   Phosphorus (mg/dL)  Date Value  36/64/4034 2.8   Sodium (mmol/L)  Date Value  12/15/2020 136   Assessment: 65 year old female with generalized deconditioning and failure to thrive. Dysphagia limiting safe PO intake, plan for G-tube placement. Pharmacy consult for electrolyte replacement.  Expect patient will be at risk for re-feeding when enteral access is established and feeds are started  K+ 5.8>5.6 (due to hemolysis).   Goal of Therapy:  K > 4 Mg > 2 All other electrolytes within normal limits  Plan:  No replacement needed at this time.  --Will follow-up electrolytes with AM labs   Martyn Malay, [facility] Clinical Pharmacist 12/15/2020

## 2020-12-15 NOTE — Consult Note (Signed)
[facility] Face-to-Face Psychiatry Consult   Reason for Consult: Follow-up consult 65 year old woman who has been severely withdrawn for quite some time.  Finally got a PEG tube. Referring Physician: Allena Katz Patient Identification: Amariss Glass MRN:  [id number] Principal Diagnosis: Depression Diagnosis:  Principal Problem:   Depression Active Problems:   AKI (acute kidney injury) (HCC)   Adult failure to thrive   Hypokalemia   Weight loss   Altered mental status   Tachycardia   Hypotension   Pressure injury of skin   Total Time spent with patient: 30 minutes  Subjective:   Chelsea Glass is a 65 y.o. female patient admitted with "I am feeling better".  HPI: Follow-up with this patient.  It is been several days since I last saw her.  She appears much better than the last time I saw her.  Today she actually spoke words to me and made eye contact.  Told me she was feeling better.  Mentioned back pain but otherwise denied any symptoms.  Denied having any suicidal thoughts.  According to notes she is starting to take p.o. better.  Seems to have improved dramatically whether just from getting some nutrition or any medication unclear.  Past Psychiatric History: See past history.  Appears to have had a major downturn since COVID  Risk to Self:   Risk to Others:   Prior Inpatient Therapy:   Prior Outpatient Therapy:    Past Medical History:  Past Medical History:  Diagnosis Date  . Arthritis   . Diabetes mellitus without complication (HCC)   . Hypertension     Past Surgical History:  Procedure Laterality Date  . IR FLUORO GUIDE CV LINE RIGHT  12/09/2020  . IR GASTROSTOMY TUBE MOD SED  12/09/2020  . TUBAL LIGATION    . UTERINE FIBROID SURGERY     Family History: History reviewed. No pertinent family history. Family Psychiatric  History: See previous Social History:  Social History   Substance and Sexual Activity  Alcohol Use No     Social History   Substance and Sexual  Activity  Drug Use No    Social History   Socioeconomic History  . Marital status: Widowed    Spouse name: Not on file  . Number of children: Not on file  . Years of education: Not on file  . Highest education level: Not on file  Occupational History  . Not on file  Tobacco Use  . Smoking status: Never Smoker  . Smokeless tobacco: Never Used  Substance and Sexual Activity  . Alcohol use: No  . Drug use: No  . Sexual activity: Not on file  Other Topics Concern  . Not on file  Social History Narrative  . Not on file   Social Determinants of Health   Financial Resource Strain: Not on file  Food Insecurity: Not on file  Transportation Needs: Not on file  Physical Activity: Not on file  Stress: Not on file  Social Connections: Not on file   Additional Social History:    Allergies:   Allergies  Allergen Reactions  . Lisinopril Swelling    Labs:  Results for orders placed or performed during the hospital encounter of 12/03/20 (from the past 48 hour(s))  Glucose, capillary     Status: Abnormal   Collection Time: 12/13/20  8:12 PM  Result Value Ref Range   Glucose-Capillary 143 (H) 70 - 99 mg/dL    Comment: Glucose reference range applies only to samples taken after fasting for at least  8 hours.  Glucose, capillary     Status: Abnormal   Collection Time: 12/13/20 11:46 PM  Result Value Ref Range   Glucose-Capillary 140 (H) 70 - 99 mg/dL    Comment: Glucose reference range applies only to samples taken after fasting for at least 8 hours.   Comment 1 Notify RN   Glucose, capillary     Status: Abnormal   Collection Time: 12/14/20  4:37 AM  Result Value Ref Range   Glucose-Capillary 128 (H) 70 - 99 mg/dL    Comment: Glucose reference range applies only to samples taken after fasting for at least 8 hours.  Renal function panel     Status: Abnormal   Collection Time: 12/14/20  7:15 AM  Result Value Ref Range   Sodium 136 135 - 145 mmol/L   Potassium 5.8 (H) 3.5 - 5.1  mmol/L    Comment: HEMOLYSIS AT THIS LEVEL MAY AFFECT RESULT   Chloride 110 98 - 111 mmol/L   CO2 18 (L) 22 - 32 mmol/L   Glucose, Bld 122 (H) 70 - 99 mg/dL    Comment: Glucose reference range applies only to samples taken after fasting for at least 8 hours.   BUN 29 (H) 8 - 23 mg/dL   Creatinine, Ser 8.93 (H) 0.44 - 1.00 mg/dL   Calcium 9.2 8.9 - 81.0 mg/dL   Phosphorus 2.8 2.5 - 4.6 mg/dL   Albumin 3.5 3.5 - 5.0 g/dL   GFR, Estimated 31 (L) >60 mL/min    Comment: (NOTE) Calculated using the CKD-EPI Creatinine Equation (2021)    Anion gap 8 5 - 15    Comment: Performed at [facility], [address] [place], [place] [address]  Glucose, capillary     Status: Abnormal   Collection Time: 12/14/20  8:02 AM  Result Value Ref Range   Glucose-Capillary 136 (H) 70 - 99 mg/dL    Comment: Glucose reference range applies only to samples taken after fasting for at least 8 hours.  Glucose, capillary     Status: Abnormal   Collection Time: 12/14/20 11:32 AM  Result Value Ref Range   Glucose-Capillary 138 (H) 70 - 99 mg/dL    Comment: Glucose reference range applies only to samples taken after fasting for at least 8 hours.  Glucose, capillary     Status: Abnormal   Collection Time: 12/14/20  3:53 PM  Result Value Ref Range   Glucose-Capillary 130 (H) 70 - 99 mg/dL    Comment: Glucose reference range applies only to samples taken after fasting for at least 8 hours.  Glucose, capillary     Status: Abnormal   Collection Time: 12/14/20 10:22 PM  Result Value Ref Range   Glucose-Capillary 130 (H) 70 - 99 mg/dL    Comment: Glucose reference range applies only to samples taken after fasting for at least 8 hours.   Comment 1 Notify RN   Glucose, capillary     Status: Abnormal   Collection Time: 12/15/20  5:23 AM  Result Value Ref Range   Glucose-Capillary 134 (H) 70 - 99 mg/dL    Comment: Glucose reference range applies only to samples taken after fasting for at least 8 hours.    Comment 1 Notify RN   Basic metabolic panel     Status: Abnormal   Collection Time: 12/15/20  5:39 AM  Result Value Ref Range   Sodium 136 135 - 145 mmol/L   Potassium 5.6 (H) 3.5 - 5.1 mmol/L  Comment: HEMOLYSIS AT THIS LEVEL MAY AFFECT RESULT   Chloride 108 98 - 111 mmol/L   CO2 19 (L) 22 - 32 mmol/L   Glucose, Bld 116 (H) 70 - 99 mg/dL    Comment: Glucose reference range applies only to samples taken after fasting for at least 8 hours.   BUN 33 (H) 8 - 23 mg/dL   Creatinine, Ser 8.01 (H) 0.44 - 1.00 mg/dL   Calcium 8.9 8.9 - 65.5 mg/dL   GFR, Estimated 28 (L) >60 mL/min    Comment: (NOTE) Calculated using the CKD-EPI Creatinine Equation (2021)    Anion gap 9 5 - 15    Comment: Performed at [facility], [address] [place], [place] [address]  Magnesium     Status: None   Collection Time: 12/15/20  5:39 AM  Result Value Ref Range   Magnesium 2.1 1.7 - 2.4 mg/dL    Comment: Performed at [facility], [address], [place], [place] [address]  Phosphorus     Status: None   Collection Time: 12/15/20  5:39 AM  Result Value Ref Range   Phosphorus 2.8 2.5 - 4.6 mg/dL    Comment: Performed at [facility], [address] [place], [place] [address]  Glucose, capillary     Status: Abnormal   Collection Time: 12/15/20  7:45 AM  Result Value Ref Range   Glucose-Capillary 111 (H) 70 - 99 mg/dL    Comment: Glucose reference range applies only to samples taken after fasting for at least 8 hours.   Comment 1 Notify RN   Glucose, capillary     Status: Abnormal   Collection Time: 12/15/20 11:54 AM  Result Value Ref Range   Glucose-Capillary 147 (H) 70 - 99 mg/dL    Comment: Glucose reference range applies only to samples taken after fasting for at least 8 hours.   Comment 1 Notify RN   Glucose, capillary     Status: Abnormal   Collection Time: 12/15/20  4:54 PM  Result Value Ref Range   Glucose-Capillary 141 (H) 70 - 99 mg/dL    Comment: Glucose  reference range applies only to samples taken after fasting for at least 8 hours.    Current Facility-Administered Medications  Medication Dose Route Frequency Provider Last Rate Last Admin  . 0.9 %  sodium chloride infusion (Manually program via Guardrails IV Fluids)   Intravenous Once Lynn Ito, MD      . 0.9 %  sodium chloride infusion (Manually program via Guardrails IV Fluids)   Intravenous Once Lynn Ito, MD      . 0.9 %  sodium chloride infusion   Intravenous PRN Lynn Ito, MD   Stopped at 12/10/20 2201  . acetaminophen (TYLENOL) tablet 650 mg  650 mg Per Tube Q6H PRN Lynn Ito, MD   650 mg at 12/14/20 2247   Or  . acetaminophen (TYLENOL) suppository 650 mg  650 mg Rectal Q6H PRN Lynn Ito, MD      . Melene Muller ON 12/16/2020] amiodarone (PACERONE) tablet 200 mg  200 mg Oral Daily Maryelizabeth Kaufmann, NP      . ascorbic acid (VITAMIN C) tablet 250 mg  250 mg Per Tube BID Lynn Ito, MD   250 mg at 12/15/20 1228  . Chlorhexidine Gluconate Cloth 2 % PADS 6 each  6 each Topical Daily Enedina Finner, MD   6 each at 12/15/20 1229  . diclofenac Sodium (VOLTAREN) 1 % topical gel 2 g  2  g Topical QID Harlon Ditty D, NP   2 g at 12/15/20 1601  . DULoxetine (CYMBALTA) DR capsule 30 mg  30 mg Oral Daily Otelia Sergeant, RPH   30 mg at 12/15/20 0930  . feeding supplement (OSMOLITE 1.5 CAL) liquid 1,000 mL  1,000 mL Per Tube Continuous Lynn Ito, MD 20 mL/hr at 12/15/20 1648 Rate Change at 12/15/20 1648  . feeding supplement (PROSource TF) liquid 45 mL  45 mL Per Tube BID Lynn Ito, MD   45 mL at 12/15/20 1228  . folic acid (FOLVITE) tablet 1 mg  1 mg Per Tube Daily Lynn Ito, MD   1 mg at 12/15/20 0930  . free water 100 mL  100 mL Per Tube 6 X Daily Lynn Ito, MD   100 mL at 12/15/20 1624  . insulin aspart (novoLOG) injection 0-9 Units  0-9 Units Subcutaneous Q4H Harlon Ditty D, NP   1 Units at 12/15/20 1223  . lidocaine (LIDODERM) 5 % 1 patch  1 patch Transdermal Q24H Lynn Ito, MD   1 patch at 12/15/20 1557  . liver oil-zinc oxide (DESITIN) 40 % ointment   Topical TID PRN Jimmye Norman, NP      . magnesium hydroxide (MILK OF MAGNESIA) suspension 30 mL  30 mL Per Tube Daily PRN Lynn Ito, MD      . metoCLOPramide (REGLAN) 5 MG/5ML solution 5 mg  5 mg Oral TID Darvin Neighbours, MD      . metoprolol succinate (TOPROL-XL) 24 hr tablet 25 mg  25 mg Oral Daily Maryelizabeth Kaufmann, NP   25 mg at 12/15/20 1600  . midodrine (PROAMATINE) tablet 10 mg  10 mg Per Tube TID WC Lynn Ito, MD   10 mg at 12/15/20 1600  . mirtazapine (REMERON SOL-TAB) disintegrating tablet 45 mg  45 mg Oral QHS Homer Miller T, MD      . OLANZapine (ZYPREXA) tablet 5 mg  5 mg Per Tube QHS Otelia Sergeant, RPH   5 mg at 12/14/20 2247  . ondansetron (ZOFRAN) tablet 4 mg  4 mg Per Tube Q6H PRN Lynn Ito, MD       Or  . ondansetron (ZOFRAN) injection 4 mg  4 mg Intravenous Q6H PRN Lynn Ito, MD   4 mg at 12/13/20 0431  . pantoprazole sodium (PROTONIX) 40 mg/20 mL oral suspension 40 mg  40 mg Per Tube BID Otelia Sergeant, RPH   40 mg at 12/15/20 0930  . polyethylene glycol (MIRALAX / GLYCOLAX) packet 17 g  17 g Per Tube BID Enedina Finner, MD   17 g at 12/15/20 0931  . traMADol (ULTRAM) tablet 50 mg  50 mg Oral Q6H PRN Mansy, Jan A, MD   50 mg at 12/15/20 0240    Musculoskeletal: Strength & Muscle Tone: within normal limits Gait & Station: unable to stand Patient leans: N/A            Psychiatric Specialty Exam:  Presentation  General Appearance: No data recorded Eye Contact:No data recorded Speech:No data recorded Speech Volume:No data recorded Handedness:No data recorded  Mood and Affect  Mood:No data recorded Affect:No data recorded  Thought Process  Thought Processes:No data recorded Descriptions of Associations:No data recorded Orientation:No data recorded Thought Content:No data recorded History of Schizophrenia/Schizoaffective disorder:No data  recorded Duration of Psychotic Symptoms:No data recorded Hallucinations:No data recorded Ideas of Reference:No data recorded Suicidal Thoughts:No data recorded Homicidal Thoughts:No data recorded  Sensorium  Memory:No data recorded  Judgment:No data recorded Insight:No data recorded  Executive Functions  Concentration:No data recorded Attention Span:No data recorded Recall:No data recorded Fund of Knowledge:No data recorded Language:No data recorded  Psychomotor Activity  Psychomotor Activity:No data recorded  Assets  Assets:No data recorded  Sleep  Sleep:No data recorded  Physical Exam: Physical Exam Vitals and nursing note reviewed.  Constitutional:      Appearance: Normal appearance.  HENT:     Head: Normocephalic and atraumatic.     Mouth/Throat:     Pharynx: Oropharynx is clear.  Eyes:     Pupils: Pupils are equal, round, and reactive to light.  Cardiovascular:     Rate and Rhythm: Normal rate and regular rhythm.  Pulmonary:     Effort: Pulmonary effort is normal.     Breath sounds: Normal breath sounds.  Abdominal:     General: Abdomen is flat.     Palpations: Abdomen is soft.  Musculoskeletal:        General: Normal range of motion.  Skin:    General: Skin is warm and dry.  Neurological:     General: No focal deficit present.     Mental Status: She is alert. Mental status is at baseline.  Psychiatric:        Attention and Perception: She is inattentive.        Mood and Affect: Affect is blunt.        Speech: Speech is delayed.        Behavior: Behavior is slowed.        Thought Content: Thought content normal.        Cognition and Memory: Memory is impaired.    Review of Systems  Constitutional: Negative.   HENT: Negative.   Eyes: Negative.   Respiratory: Negative.   Cardiovascular: Negative.   Gastrointestinal: Negative.   Musculoskeletal: Positive for back pain.  Skin: Negative.   Neurological: Negative.   Psychiatric/Behavioral:  Negative.    Blood pressure (!) 150/97, pulse (!) 109, temperature 97.7 F (36.5 C), resp. rate 19, height 5\' 5"  (1.651 m), weight 124.3 kg, SpO2 (!) 84 %. Body mass index is 45.6 kg/m.  Treatment Plan Summary: Medication management and Plan This is a 65 year old woman with possible major depression as part of the reason for her impairment.  Looking much better now.  Getting 45 mg of Remeron plus the 30 mg of duloxetine.  Appears to be getting closer to discharge.  Supportive counseling reminded patient that she is on medication.  No change to orders for now.  Disposition: Patient does not meet criteria for psychiatric inpatient admission. Supportive therapy provided about ongoing stressors.  Mordecai RasmussenJohn Saadia Dewitt, MD 12/15/2020 5:23 PM

## 2020-12-16 DIAGNOSIS — L89300 Pressure ulcer of unspecified buttock, unstageable: Secondary | ICD-10-CM

## 2020-12-16 DIAGNOSIS — I4891 Unspecified atrial fibrillation: Secondary | ICD-10-CM

## 2020-12-16 DIAGNOSIS — L8990 Pressure ulcer of unspecified site, unspecified stage: Secondary | ICD-10-CM | POA: Insufficient documentation

## 2020-12-16 LAB — GLUCOSE, CAPILLARY
Glucose-Capillary: 157 mg/dL — ABNORMAL HIGH (ref 70–99)
Glucose-Capillary: 155 mg/dL — ABNORMAL HIGH (ref 70–99)
Glucose-Capillary: 150 mg/dL — ABNORMAL HIGH (ref 70–99)
Glucose-Capillary: 158 mg/dL — ABNORMAL HIGH (ref 70–99)

## 2020-12-16 LAB — CBC
HCT: 24.5 % — ABNORMAL LOW (ref 36.0–46.0)
Hemoglobin: 8.3 g/dL — ABNORMAL LOW (ref 12.0–15.0)
MCH: 28.9 pg (ref 26.0–34.0)
MCHC: 33.9 g/dL (ref 30.0–36.0)
MCV: 85.4 fL (ref 80.0–100.0)
Platelets: 234 10*3/uL (ref 150–400)
RBC: 2.87 MIL/uL — ABNORMAL LOW (ref 3.87–5.11)
RDW: 21.5 % — ABNORMAL HIGH (ref 11.5–15.5)
WBC: 17.3 10*3/uL — ABNORMAL HIGH (ref 4.0–10.5)
nRBC: 0.6 % — ABNORMAL HIGH (ref 0.0–0.2)

## 2020-12-16 LAB — POTASSIUM: Potassium: 5.4 mmol/L — ABNORMAL HIGH (ref 3.5–5.1)

## 2020-12-16 LAB — PHOSPHORUS: Phosphorus: 3.7 mg/dL (ref 2.5–4.6)

## 2020-12-16 LAB — MAGNESIUM: Magnesium: 2.2 mg/dL (ref 1.7–2.4)

## 2020-12-16 MED ORDER — MIDODRINE HCL 10 MG PO TABS: 10.0000 mg | ORAL_TABLET | Freq: Three times a day (TID) | ORAL | 0 refills | Status: AC

## 2020-12-16 MED ORDER — METOCLOPRAMIDE HCL 5 MG/5ML PO SOLN: 5.0000 mg | Freq: Three times a day (TID) | ORAL | 0 refills | Status: AC

## 2020-12-16 MED ORDER — ZINC OXIDE 40 % EX OINT: TOPICAL_OINTMENT | Freq: Three times a day (TID) | CUTANEOUS | 0 refills | Status: AC | PRN

## 2020-12-16 MED ORDER — TRAMADOL HCL 50 MG PO TABS: 50.0000 mg | ORAL_TABLET | Freq: Two times a day (BID) | ORAL | 0 refills | Status: AC | PRN

## 2020-12-16 MED ORDER — AMIODARONE HCL 200 MG PO TABS: 200.0000 mg | ORAL_TABLET | Freq: Every day | ORAL | 2 refills | Status: AC

## 2020-12-16 MED ORDER — FOLIC ACID 1 MG PO TABS: 1.0000 mg | ORAL_TABLET | Freq: Every day | ORAL | 2 refills | Status: AC

## 2020-12-16 MED ORDER — ESOMEPRAZOLE MAGNESIUM 20 MG PO CPDR: 20.0000 mg | DELAYED_RELEASE_CAPSULE | Freq: Every day | ORAL | 0 refills | Status: AC

## 2020-12-16 MED ORDER — POLYETHYLENE GLYCOL 3350 17 G PO PACK: 17.0000 g | PACK | Freq: Two times a day (BID) | ORAL | 0 refills | Status: AC

## 2020-12-16 MED ORDER — SODIUM ZIRCONIUM CYCLOSILICATE 10 G PO PACK
10.0000 g | PACK | Freq: Once | ORAL | Status: AC
  Administered 2020-12-16: 10 g via ORAL
  Filled 2020-12-16: qty 1

## 2020-12-16 MED ORDER — OSMOLITE 1.5 CAL PO LIQD: 1000.0000 mL | ORAL | 0 refills | Status: AC

## 2020-12-16 MED ORDER — PROSOURCE TF PO LIQD: 45.0000 mL | Freq: Two times a day (BID) | ORAL | 2 refills | Status: AC

## 2020-12-16 MED ORDER — METOPROLOL SUCCINATE ER 25 MG PO TB24: 25.0000 mg | ORAL_TABLET | Freq: Every day | ORAL | 1 refills | Status: AC

## 2020-12-16 MED ORDER — LIDOCAINE 5 % EX PTCH: 1.0000 | MEDICATED_PATCH | CUTANEOUS | 0 refills | Status: AC

## 2020-12-16 MED ORDER — DICLOFENAC SODIUM 1 % EX GEL: 2.0000 g | Freq: Four times a day (QID) | CUTANEOUS | 0 refills | Status: AC

## 2020-12-16 MED ORDER — OLANZAPINE 5 MG PO TABS: 5.0000 mg | ORAL_TABLET | Freq: Every day | ORAL | 1 refills | Status: AC

## 2020-12-16 MED ORDER — MIRTAZAPINE 45 MG PO TBDP: 45.0000 mg | ORAL_TABLET | Freq: Every day | ORAL | 1 refills | Status: AC

## 2020-12-16 MED ORDER — FREE WATER: 100.0000 mL | Freq: Every day | 2 refills | Status: AC

## 2020-12-16 MED ORDER — METOCLOPRAMIDE HCL 5 MG/5ML PO SOLN
5.0000 mg | Freq: Three times a day (TID) | ORAL | Status: DC
  Administered 2020-12-16 (×2): 5 mg via ORAL
  Filled 2020-12-16 (×3): qty 10

## 2020-12-16 MED ORDER — LACTULOSE 10 GM/15ML PO SOLN
20.0000 g | Freq: Once | ORAL | Status: AC
  Administered 2020-12-16: 20 g
  Filled 2020-12-16: qty 30

## 2020-12-16 MED ORDER — ASCORBIC ACID 250 MG PO TABS: 250.0000 mg | ORAL_TABLET | Freq: Two times a day (BID) | ORAL | 0 refills | Status: AC

## 2020-12-16 NOTE — Progress Notes (Signed)
[facility]  ROUNDING NOTE   Subjective:   Patient seen resting in bed Alert and smiling on arrival Denies nausea Denies shortness of breath Says she is ready to go home  Tube Feeds @ 40 ml/hr  Objective:  Vital signs in last 24 hours:  Temp:  [97.6 F (36.4 C)-98 F (36.7 C)] 97.6 F (36.4 C) (03/11 0746) Pulse Rate:  [99-109] 99 (03/11 0746) Resp:  [14-21] 21 (03/11 0746) BP: (114-150)/(70-98) 114/70 (03/11 0746) SpO2:  [91 %-100 %] 94 % (03/11 0746)  Weight change:  Filed Weights   12/11/20 0119 12/12/20 0500 12/14/20 0452  Weight: 120.2 kg 126.4 kg 124.3 kg    Intake/Output: I/O last 3 completed shifts: In: 3007 [P.O.:150; I.V.:30; NG/GT:2827] Out: -    Intake/Output this shift:  No intake/output data recorded.  Physical Exam: General: NAD, laying in bed  Head: Normocephalic, atraumatic. Moist oral mucosal membranes  Eyes: Anicteric,  Neck: Supple, trachea midline  Lungs:  Clear to auscultation  Heart: Regular rate and rhythm  Abdomen:  +G-tube  Extremities:  ++ peripheral edema.  Neurologic: Able to answer simple questions  Skin: No lesions       Basic Metabolic Panel: Recent Labs  Lab 12/10/20 0806 12/10/20 1608 12/11/20 0425 12/12/20 0450 12/13/20 0353 12/14/20 0715 12/15/20 0539 12/16/20 0529  NA 138  --  136 137 136 136 136  --   K 2.9*   < > 3.9 4.2 4.3 5.8* 5.6* 5.4*  CL 114*  --  111 111 110 110 108  --   CO2 16*  --  18* 19* 20* 18* 19*  --   GLUCOSE 147*  --  183* 101* 145* 122* 116*  --   BUN 16  --  18 20 24* 29* 33*  --   CREATININE 1.32*  --  1.52* 1.61* 1.77* 1.80* 1.93*  --   CALCIUM 7.8*  --  8.4* 8.8* 9.1 9.2 8.9  --   MG 1.7  --  2.0 2.1  --   --  2.1 2.2  PHOS 2.7  --  2.8 2.3* 2.2* 2.8 2.8 3.7   < > = values in this interval not displayed.    Liver Function Tests: Recent Labs  Lab 12/10/20 0806 12/11/20 0425 12/14/20 0715  ALBUMIN 1.5* 1.6* 3.5   No results for input(s): LIPASE, AMYLASE in the  last 168 hours. No results for input(s): AMMONIA in the last 168 hours.  CBC: Recent Labs  Lab 12/10/20 0806 12/11/20 0425 12/13/20 0353 12/16/20 0529  WBC  --  11.2* 10.9* 17.3*  HGB 7.1* 9.3* 8.1* 8.3*  HCT 20.4* 26.8* 23.5* 24.5*  MCV  --  83.2 83.3 85.4  PLT  --  130* 124* 234    Cardiac Enzymes: No results for input(s): CKTOTAL, CKMB, CKMBINDEX, TROPONINI in the last 168 hours.  BNP: Invalid input(s): POCBNP  CBG: Recent Labs  Lab 12/15/20 1654 12/15/20 2128 12/15/20 2342 12/16/20 0444 12/16/20 0742  GLUCAP 141* 152* 150* 157* 155*    Microbiology: Results for orders placed or performed during the hospital encounter of 12/03/20  Resp Panel by RT-PCR (Flu A&B, Covid) Nasopharyngeal Swab     Status: None   Collection Time: 12/03/20  1:35 PM   Specimen: Nasopharyngeal Swab; Nasopharyngeal(NP) swabs in vial transport medium  Result Value Ref Range Status   SARS Coronavirus 2 by RT PCR NEGATIVE NEGATIVE Final    Comment: (NOTE) SARS-CoV-2 target nucleic acids are NOT DETECTED.  The  SARS-CoV-2 RNA is generally detectable in upper respiratory specimens during the acute phase of infection. The lowest concentration of SARS-CoV-2 viral copies this assay can detect is 138 copies/mL. A negative result does not preclude SARS-Cov-2 infection and should not be used as the sole basis for treatment or other patient management decisions. A negative result may occur with  improper specimen collection/handling, submission of specimen other than nasopharyngeal swab, presence of viral mutation(s) within the areas targeted by this assay, and inadequate number of viral copies(<138 copies/mL). A negative result must be combined with clinical observations, patient history, and epidemiological information. The expected result is Negative.  Fact Sheet for Patients:  BloggerCourse.com  Fact Sheet for Healthcare Providers:   SeriousBroker.it  This test is no t yet approved or cleared by the [place] FDA and  has been authorized for detection and/or diagnosis of SARS-CoV-2 by FDA under an Emergency Use Authorization (EUA). This EUA will remain  in effect (meaning this test can be used) for the duration of the COVID-19 declaration under Section 564(b)(1) of the Act, 21 U.S.C.section 360bbb-3(b)(1), unless the authorization is terminated  or revoked sooner.       Influenza A by PCR NEGATIVE NEGATIVE Final   Influenza B by PCR NEGATIVE NEGATIVE Final    Comment: (NOTE) The Xpert Xpress SARS-CoV-2/FLU/RSV plus assay is intended as an aid in the diagnosis of influenza from Nasopharyngeal swab specimens and should not be used as a sole basis for treatment. Nasal washings and aspirates are unacceptable for Xpert Xpress SARS-CoV-2/FLU/RSV testing.  Fact Sheet for Patients: BloggerCourse.com  Fact Sheet for Healthcare Providers: SeriousBroker.it  This test is not yet approved or cleared by the [place] FDA and has been authorized for detection and/or diagnosis of SARS-CoV-2 by FDA under an Emergency Use Authorization (EUA). This EUA will remain in effect (meaning this test can be used) for the duration of the COVID-19 declaration under Section 564(b)(1) of the Act, 21 U.S.C. section 360bbb-3(b)(1), unless the authorization is terminated or revoked.  Performed at [facility], [address] [place], [place] [address]   CULTURE, BLOOD (ROUTINE X 2) w Reflex to ID Panel     Status: None   Collection Time: 12/06/20 10:55 AM   Specimen: BLOOD  Result Value Ref Range Status   Specimen Description BLOOD LEFT WRIST  Final   Special Requests   Final    BOTTLES DRAWN AEROBIC ONLY Blood Culture adequate volume   Culture   Final    NO GROWTH 5 DAYS Performed at [facility], [address]  [place], [place] [address]    Report Status 12/11/2020 FINAL  Final  CULTURE, BLOOD (ROUTINE X 2) w Reflex to ID Panel     Status: None   Collection Time: 12/06/20 11:05 AM   Specimen: BLOOD  Result Value Ref Range Status   Specimen Description BLOOD RIGHT WRIST  Final   Special Requests   Final    BOTTLES DRAWN AEROBIC ONLY Blood Culture results may not be optimal due to an inadequate volume of blood received in culture bottles   Culture   Final    NO GROWTH 5 DAYS Performed at [facility], [address], [place], [place] [address]    Report Status 12/11/2020 FINAL  Final  MRSA PCR Screening     Status: None   Collection Time: 12/06/20  1:18 PM   Specimen: Nasal Mucosa; Nasopharyngeal  Result Value Ref Range Status   MRSA by PCR NEGATIVE  NEGATIVE Final    Comment:        The GeneXpert MRSA Assay (FDA approved for NASAL specimens only), is one component of a comprehensive MRSA colonization surveillance program. It is not intended to diagnose MRSA infection nor to guide or monitor treatment for MRSA infections. Performed at [facility], [address], [place], [place] [address]   Urine Culture     Status: None   Collection Time: 12/06/20  1:18 PM   Specimen: Urine, Random  Result Value Ref Range Status   Specimen Description   Final    URINE, RANDOM Performed at [facility], [address] [place], [place] [address]    Special Requests   Final    NONE Performed at [facility], [address], [place], [place] [address]    Culture   Final    NO GROWTH Performed at [facility], [address], [place], [place] [address]    Report Status 12/08/2020 FINAL  Final    Coagulation Studies: No results for input(s): LABPROT, INR in the last 72 hours.  Urinalysis: No results for input(s): COLORURINE, LABSPEC, PHURINE, GLUCOSEU, HGBUR, BILIRUBINUR, KETONESUR, PROTEINUR, UROBILINOGEN, NITRITE, LEUKOCYTESUR in the last 72  hours.  Invalid input(s): APPERANCEUR    Imaging: No results found.   Medications:   . sodium chloride Stopped (12/10/20 2201)  . feeding supplement (OSMOLITE 1.5 CAL) 60 mL/hr at 12/15/20 1648   . sodium chloride   Intravenous Once  . sodium chloride   Intravenous Once  . amiodarone  200 mg Oral Daily  . vitamin C  250 mg Per Tube BID  . Chlorhexidine Gluconate Cloth  6 each Topical Daily  . diclofenac Sodium  2 g Topical QID  . DULoxetine  30 mg Oral Daily  . feeding supplement (PROSource TF)  45 mL Per Tube BID  . folic acid  1 mg Per Tube Daily  . free water  100 mL Per Tube 6 X Daily  . insulin aspart  0-9 Units Subcutaneous Q4H  . lidocaine  1 patch Transdermal Q24H  . metoCLOPramide  5 mg Oral TID AC  . metoprolol succinate  25 mg Oral Daily  . midodrine  10 mg Per Tube TID WC  . mirtazapine  45 mg Oral QHS  . OLANZapine  5 mg Per Tube QHS  . pantoprazole sodium  40 mg Per Tube BID  . polyethylene glycol  17 g Per Tube BID   sodium chloride, acetaminophen **OR** acetaminophen, liver oil-zinc oxide, magnesium hydroxide, ondansetron **OR** ondansetron (ZOFRAN) IV, traMADol  Assessment/ Plan:  Ms. Chelsea Glass is a 65 y.o. black female with diabetes mellitus type II, hypertension, osteoarthritis, hyperlipidemia who was admitted to [facility] on 12/03/2020 for AKI (acute kidney injury) (HCC) [N17.9] Altered mental status, unspecified altered mental status type [R41.82]  Found to have aspiration pneumonia  1. Acute kidney injury: baseline creatinine of 0.95 with normal GFR >60 on 12/10/13/2019.  Urinalysis with proteinuria and hematuria.  History of diabetic nephropathy.  - BLE edema, RUE edema - steady Creatinine 1.9  2. Hypotension: Holding home metoprolol dose.  - midodrine  3. Hypokalemia: improved with IV fluids.  -current level 5.4 -likely due to the start of continuous tube feeds -Will continue to monitor  4. Metabolic acidosis:  - improved  5. Anemia  with renal failure: PRBC transfusion 3/4 and 3/6 Currently 8.3 Will continue monitor   LOS: 13   3/11/202211:39 AM

## 2020-12-16 NOTE — TOC Progression Note (Addendum)
Transition of Care [facility]) - Progression Note    Patient Details  Name: Chelsea Glass MRN: [id number] Date of Birth: 1956/06/14  Transition of Care [facility]) CM/SW Contact  Caryn Section, RN Phone Number: 12/16/2020, 12:36 PM  Clinical Narrative:   Update 1254pm:  Daughter at bedside, confirmed that patient will need EMS to transport to [address] in [place].  Daughter is awaiting nursing to instruct her on administering tube feedings, patients assigned nurse notified.  Daughter informed that oxygen will be delivered today, states she will ensure someone is home to accept delivery.   Patient in room sleeping, left message for daughter re: discharge planning.  Tube feeds delivered yesterday, home health ([facility]) aware and able to come to home Sunday.  O2 ordered through [employer], sats note pending.  Awaiting discharge orders.  TOC to follow.   Expected Discharge Plan: Home w Home Health Services Barriers to Discharge: Continued Medical Work up  Expected Discharge Plan and Services Expected Discharge Plan: Home w Home Health Services   Discharge Planning Services: CM Consult   Living arrangements for the past 2 months: Single Family Home Expected Discharge Date: 12/16/20                         HH Arranged: PT HH Agency: [facility] Date HH Agency Contacted: 12/05/20   Representative spoke with at [facility] Agency: Elnita Maxwell   Social Determinants of Health (SDOH) Interventions    Readmission Risk Interventions No flowsheet data found.

## 2020-12-16 NOTE — Progress Notes (Signed)
Left ig removed, per policy. vasaline gauze and tegaderm covering site. Right Picc line removed , per policy, vasaline gauze and tegaderm in place. No sign of hematoma.

## 2020-12-16 NOTE — Plan of Care (Signed)
  Problem: Activity: Goal: Risk for activity intolerance will decrease 12/16/2020 0458 by Margie Ege, RN Outcome: Not Progressing 12/16/2020 0457 by Margie Ege, RN Outcome: Not Progressing   Problem: Nutrition: Goal: Adequate nutrition will be maintained 12/16/2020 0458 by Margie Ege, RN Outcome: Not Progressing 12/16/2020 0457 by Margie Ege, RN Outcome: Not Progressing

## 2020-12-16 NOTE — Discharge Summary (Signed)
[facility] - [place] at [facility]   PATIENT NAME: Chelsea Glass    MR#:  [id number]  DATE OF BIRTH:  Dec 04, 1955  DATE OF ADMISSION:  12/03/2020 ADMITTING PHYSICIAN: Hannah Beat, MD  DATE OF DISCHARGE: 12/16/2020  PRIMARY CARE PHYSICIAN: [facility]    ADMISSION DIAGNOSIS:  AKI (acute kidney injury) (HCC) [N17.9] Altered mental status, unspecified altered mental status type [R41.82]  DISCHARGE DIAGNOSIS:  adult failure to thrive/generalized deconditioning status post peg tube placement a fib with RVR acute renal failure SECONDARY DIAGNOSIS:   Past Medical History:  Diagnosis Date  . Arthritis   . Diabetes mellitus without complication (HCC)   . Hypertension     HOSPITAL COURSE:   Chelsea Thompsonis a 65 y.o.African-American femalewith medical history significant forosteoarthritis, type 2 diabetes mellitus and hypertension, as well as COVID-19 in September 2021, who presented to the emergency room with acute onset of altered mental status and not acting herself for the last 4 days with excessive sleepiness.She has been having nausea and dry heaves without abdominal pain. Her daughter stated that she has not eaten much and has been bed bound since September 2021 since her COVID-19 infection.   Adult failure to thrive/poor PO intake/generalized deconditioning Hypokalemia/depressed mood -- patient came in with ongoing weight loss and poor PO intake per daughter. Since COVID infection in September 2021 per daughter patient has been going downhill. -- Daughter says patient has lost more than hundred pounds since September 2021. She has not ambulated and no interest in any acitivity. Stays in bed most of the time --s/p PEG tube placement on 3/4 --3/9-- to feeding resume. Tolerating okay. Patient did work with speech therapy. She is inconsistent with her PO intake. Daughter at bedside learning to manage peg feeding. Will discuss with  dietitian regarding bolus feedings since patient's daughter is considering taking her home. -- d/w Daughter and  recommended to continue encourage oral intake. --3/10--PEG at 50cc/min. dter at bed side going to help pt eat lunch. Pt in better spirits. Discussed do some mobility exercise in bed --3/11-- doing about the same. Having BM's with lactulose and lokelma. Wants to go home  Hypotension with sinus tachycardia, acute renal failure -- patient received three doses of IV albumin -- creatinine 1.8 --d/w Dr Wynelle Link to hold lasix given rising creat. Pt has dependent edema and she needs to mobilize a bit more--d/w dter  Atrial fib-rvr --Was started on amiodarone infusion...>remains in NSR>> now on PO amiodarone 200 mg qd and low dose BB --Cardiology consult with Dr Welton Flakes --3/9-- not a candidate for chronic anticoagulation due to drop in hemoglobin. Heparin drip discontinued. --HR stable  Acute drop in H/H GIB -- Seen by G.I. No G.I. workup recommended at present. -- Continue Protonix -- patient is status post two unit blood transfusion during hospitalization -- hemoglobin 8.3  Decrease Urine Output/AKI Anasarca --Nephrology following --Creatinine 1.29>>>1.52>>1.61>>1.77>>1.8. UOP stable --Completed last dose of albumin ordered by nephrology --Low threshold for IV diuretics, but will hold off at this time based on increased urine output and increasing creat. 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 Neuro- recommends discussed with neurosurgery. --3/1--per Dr Adriana Simas "This looks rather stable compared to CT from  previous. This is likely benign, possible even cystic.  She would need a contrasted image to delineate. No urgent intervention needed as we know it has been there for some time. Unrelated to current issues and wont cause encephalopathy 3/2-f/u neurosurgery as outpt to discuss options going forward once she recovers from acute illness. No intervention recommended at this time.  Depressed mood -- continue Cymbalta, remeron and zyprexa -- psych consult with Dr. Toni Amend.   Type II diabetes with sugars controlled -- sliding scale insulin -- hold PO diabetes meds  Suspected OSA with desaturations --oxygen 2liters/min --pt will benefit from Sleep study  Generalized deconditioning -- PT evaluation noted--rec SNF however dter would like to take her home  Palliative following --appreciate input. [facility] palliative will f/u as out ot  Nutrition Status: Nutrition Problem: Inadequate oral intake Etiology: lethargy/confusion Signs/Symptoms: NPO status Pressure Injury 12/11/20 Buttocks Bilateral Stage 2 -  Partial thickness loss of dermis presenting as a shallow open injury with a red, pink wound bed without slough. stage 2 on different parts of buttocks generalized, butterfly mepilex in place also und (Active)  12/11/20 0800  Location: Buttocks  Location Orientation: Bilateral  Staging: Stage 2 -  Partial thickness loss of dermis presenting as a shallow open injury with a red, pink wound bed without slough.  Wound Description (Comments): stage 2 on different parts of buttocks generalized, butterfly mepilex in place also under thigh fold  Present on Admission:     DVT prophylaxis:scd. No Lovenox due to GI bleed Code Status:full Family Communication:daugter updated at bedside  Status is: Inpatient Patient is inpatient due to severity of her illness needing hospitalization , need IV treatments  Dispo: The patient is from: Home Anticipated d/c is to: home with [facility] Patient currently is medically best  optimized  to d/c. Difficult to place patient No anticipated discharge date:12/17/18  3/10--Patient has come along ways!! Discussed at length regarding discharge plan, tube feeding, exercise/PT, prevention of bedsores, oral intake and encouraging patient to do all of the above at length with patient's daughter in the room and patient. Daughter understands patient is at a high risk for readmission.   3/11-- left message for dter regarding d/c plans for today   CONSULTS OBTAINED:  Treatment Team:  Clapacs, Jackquline Denmark, MD Erick Blinks, MD Lucy Chris, MD  DRUG ALLERGIES:   Allergies  Allergen Reactions  . Lisinopril Swelling    DISCHARGE MEDICATIONS:   Allergies as of 12/16/2020      Reactions   Lisinopril Swelling      Medication List    STOP taking these medications   mirtazapine 15 MG tablet Commonly known as: REMERON Replaced by: mirtazapine 45 MG disintegrating tablet     TAKE these medications   amiodarone 200 MG tablet Commonly known as: PACERONE Take 1 tablet (200 mg total) by mouth daily. Start taking on: December 17, 2020   ascorbic acid 250 MG tablet Commonly known as: VITAMIN C Place 1 tablet (250 mg total) into feeding tube 2 (two) times daily.   diclofenac Sodium 1 % Gel Commonly known as: VOLTAREN Apply 2 g topically 4 (four) times daily.   DULoxetine 30 MG capsule Commonly known as: CYMBALTA Take 30 mg by mouth daily.   esomeprazole 20 MG capsule Commonly known as: NEXIUM Take 1 capsule (20 mg total) by mouth daily at 12 noon.   feeding supplement (OSMOLITE 1.5 CAL) Liqd Place 1,000 mLs into feeding tube continuous.   feeding supplement (PROSource TF) liquid Place 45  mLs into feeding tube 2 (two) times daily.   folic acid 1 MG tablet Commonly known as: FOLVITE Place 1 tablet (1 mg total) into feeding tube daily. Start taking on: 18-Dec-2020   free water Soln Place 100 mLs into feeding tube 6 (six)  times daily.   lidocaine 5 % Commonly known as: LIDODERM Place 1 patch onto the skin daily. Remove & Discard patch within 12 hours or as directed by MD   liver oil-zinc oxide 40 % ointment Commonly known as: DESITIN Apply topically 3 (three) times daily as needed for irritation.   metoCLOPramide 5 MG/5ML solution Commonly known as: REGLAN Take 5 mLs (5 mg total) by mouth 3 (three) times daily before meals.   metoprolol succinate 25 MG 24 hr tablet Commonly known as: TOPROL-XL Take 1 tablet (25 mg total) by mouth daily. Start taking on: 2020/12/18 What changed:   medication strength  how much to take   midodrine 10 MG tablet Commonly known as: PROAMATINE Place 1 tablet (10 mg total) into feeding tube 3 (three) times daily with meals.   mirtazapine 45 MG disintegrating tablet Commonly known as: REMERON SOL-TAB Take 1 tablet (45 mg total) by mouth at bedtime. Replaces: mirtazapine 15 MG tablet   OLANZapine 5 MG tablet Commonly known as: ZYPREXA Place 1 tablet (5 mg total) into feeding tube at bedtime.   polyethylene glycol 17 g packet Commonly known as: MIRALAX / GLYCOLAX Place 17 g into feeding tube 2 (two) times daily.   traMADol 50 MG tablet Commonly known as: ULTRAM Take 1 tablet (50 mg total) by mouth every 12 (twelve) hours as needed for moderate pain or severe pain.            Durable Medical Equipment  (From admission, onward)         Start     Ordered   12/16/20 1144  For home use only DME oxygen  Once       Question Answer Comment  Length of Need 6 Months   Mode or (Route) Nasal cannula   Liters per Minute 2   Frequency Continuous (stationary and portable oxygen unit needed)   Oxygen conserving device Yes   Oxygen delivery system Gas      12/16/20 1143           Discharge Care Instructions  (From admission, onward)         Start     Ordered   12/16/20 0000  Discharge wound care:       Comments: Butterfly mepilex on buttocks   Tegaderm on right lateral lumbar area   12/16/20 1142          If you experience worsening of your admission symptoms, develop shortness of breath, life threatening emergency, suicidal or homicidal thoughts you must seek medical attention immediately by calling 911 or calling your MD immediately  if symptoms less severe.  You Must read complete instructions/literature along with all the possible adverse reactions/side effects for all the Medicines you take and that have been prescribed to you. Take any new Medicines after you have completely understood and accept all the possible adverse reactions/side effects.   Please note  You were cared for by a hospitalist during your hospital stay. If you have any questions about your discharge medications or the care you received while you were in the hospital after you are discharged, you can call the unit and asked to speak with the hospitalist on call if  the hospitalist that took care of you is not available. Once you are discharged, your primary care physician will handle any further medical issues. Please note that NO REFILLS for any discharge medications will be authorized once you are discharged, as it is imperative that you return to your primary care physician (or establish a relationship with a primary care physician if you do not have one) for your aftercare needs so that they can reassess your need for medications and monitor your lab values. Today   SUBJECTIVE  doing about the same TF at goal rate per RN  VITAL SIGNS:  Blood pressure 114/70, pulse 99, temperature 97.6 F (36.4 C), resp. rate (!) 21, height  (1.651 m), weight 124.3 kg, SpO2 94 %.  I/O:    Intake/Output Summary (Last 24 hours) at 12/16/2020 1144 Last data filed at 12/16/2020 0500 Gross per 24 hour  Intake 3007 ml  Output -  Net 3007 ml    PHYSICAL EXAMINATION:   GENERAL:  65 y.o.-year-old patient lying in the bed with no acute distress. Obese LUNGS: Normal  breath sounds bilaterally, no wheezing, rales, rhonchi. No use of accessory muscles of respiration.  CARDIOVASCULAR: S1, S2 normal. No murmurs, rubs, or gallops.  ABDOMEN: Soft, nontender, nondistended. Bowel sounds present. PEG+ EXTREMITIES: upper and lower extremity dependent edema NEUROLOGIC: more awake. Grossly nonfocal.   PSYCHIATRIC:  patient is alert and awake. Mood stable affect flat SKIN:  Pressure Injury 12/11/20 Buttocks Bilateral Stage 2 -  Partial thickness loss of dermis presenting as a shallow open injury with a red, pink wound bed without slough. stage 2 on different parts of buttocks generalized, butterfly mepilex in place also und (Active)  12/11/20 0800  Location: Buttocks  Location Orientation: Bilateral  Staging: Stage 2 -  Partial thickness loss of dermis presenting as a shallow open injury with a red, pink wound bed without slough.  Wound Description (Comments): stage 2 on different parts of buttocks generalized, butterfly mepilex in place also under thigh fold  Present on Admission:     DATA REVIEW:   CBC  Recent Labs  Lab 12/16/20 0529  WBC 17.3*  HGB 8.3*  HCT 24.5*  PLT 234    Chemistries  Recent Labs  Lab 12/15/20 0539 12/16/20 0529  NA 136  --   K 5.6* 5.4*  CL 108  --   CO2 19*  --   GLUCOSE 116*  --   BUN 33*  --   CREATININE 1.93*  --   CALCIUM 8.9  --   MG 2.1 2.2    Microbiology Results   Recent Results (from the past 240 hour(s))  MRSA PCR Screening     Status: None   Collection Time: 12/06/20  1:18 PM   Specimen: Nasal Mucosa; Nasopharyngeal  Result Value Ref Range Status   MRSA by PCR NEGATIVE NEGATIVE Final    Comment:        The GeneXpert MRSA Assay (FDA approved for NASAL specimens only), is one component of a comprehensive MRSA colonization surveillance program. It is not intended to diagnose MRSA infection nor to guide or monitor treatment for MRSA infections. Performed at [facility], [address] [place], [place] [address]   Urine Culture     Status: None   Collection Time: 12/06/20  1:18 PM   Specimen: Urine, Random  Result Value Ref Range Status   Specimen Description   Final    URINE, RANDOM Performed at [facility], [address] [place], [place] [address]    Special Requests   Final    NONE Performed at [facility], [address] [place], [place] [address]    Culture   Final    NO GROWTH Performed at [facility], [address] [place], [place] [address]    Report Status 12/08/2020 FINAL  Final    RADIOLOGY:  No results found.   CODE STATUS:     Code Status Orders  (From admission, onward)         Start     Ordered   12/09/20 1207  Full code  Continuous        12/09/20 1206        Code Status History    Date Active Date Inactive Code Status Order ID Comments User Context   12/03/2020 2042 12/09/2020 1206 Full Code [id number]  Arville CareMansy, Vernetta HoneyJan A, MD Inpatient   06/12/2020 1804 06/27/2020 2002 Full Code [id number]  Lurene ShadowAyiku, Bernard, MD ED   Advance Care Planning Activity       TOTAL TIME TAKING CARE OF THIS PATIENT: *40* minutes.    Enedina FinnerSona Patel M.D  [facility]    CC: Primary care physician; [facility]

## 2020-12-16 NOTE — Progress Notes (Signed)
Pt oxygen on room air is  87%  Oxygen level on 1L is 95%

## 2020-12-16 NOTE — Progress Notes (Signed)
PHARMACY CONSULT NOTE  Pharmacy Consult for Electrolyte Monitoring and Replacement   Recent Labs: Potassium (mmol/L)  Date Value  12/16/2020 5.4 (H)   Magnesium (mg/dL)  Date Value  94/17/4081 2.2   Calcium (mg/dL)  Date Value  44/81/8563 8.9   Albumin (g/dL)  Date Value  14/97/0263 3.5   Phosphorus (mg/dL)  Date Value  78/58/8502 3.7   Sodium (mmol/L)  Date Value  12/15/2020 136   Assessment: 65 year old female with generalized deconditioning and failure to thrive. Dysphagia limiting safe PO intake, plan for G-tube placement. Pharmacy consult for electrolyte replacement.  Expect patient will be at risk for re-feeding when enteral access is established and feeds are started  K+ 5.8>5.6>(lokelma 5g x1)>5.4 Mg/Phos WNL  Goal of Therapy:  K > 4 Mg > 2 All other electrolytes within normal limits  Plan:  No replacement needed at this time.  --Will follow-up electrolytes with AM labs   Martyn Malay, [facility] Clinical Pharmacist 12/16/2020

## 2020-12-16 NOTE — Progress Notes (Signed)
g tube flushed and clapped.

## 2020-12-16 NOTE — TOC Transition Note (Signed)
Transition of Care [facility]) - CM/SW Discharge Note   Patient Details  Name: Chelsea Glass MRN: [id number] Date of Birth: [birth year]  [facility]) CM/SW Contact:  Margarito Liner, LCSW Phone Number: 12/16/2020, 4:24 PM   Clinical Narrative: Patient has orders to discharge home today. Oxygen has been delivered to the home. EMS transport has been arranged and she is 5th on the list. Asked RN to call daughter once EMS is here to pick her up. No further concerns. CSW signing off.    Final next level of care: Home w Home Health Services Barriers to Discharge: Barriers Resolved   Patient Goals and CMS Choice     Choice offered to / list presented to : NA  Discharge Placement                Patient to be transferred to facility by: EMS Name of family member notified: Deloris Chambers Patient and family notified of of transfer: 12/16/20  Discharge Plan and Services   Discharge Planning Services: CM Consult            DME Arranged: Tamsen Snider feeding,Oxygen DME Agency: [employer] Date DME Agency Contacted: 12/16/20   Representative spoke with at DME Agency: Oletha Cruel HH Arranged: RN,PT,OT HH Agency: [facility] Date [facility] Agency Contacted: 12/16/20   Representative spoke with at [facility] Agency: Becky Sax  Social Determinants of Health (SDOH) Interventions     Readmission Risk Interventions No flowsheet data found.

## 2020-12-17 ENCOUNTER — Emergency Department: Payer: Medicare Other

## 2020-12-17 ENCOUNTER — Encounter: Payer: Self-pay | Admitting: Radiology

## 2020-12-17 ENCOUNTER — Inpatient Hospital Stay
Admission: EM | Admit: 2020-12-17 | Discharge: 2021-01-06 | DRG: 871 | Disposition: E | Payer: Medicare Other | Attending: Family Medicine | Admitting: Family Medicine

## 2020-12-17 DIAGNOSIS — D638 Anemia in other chronic diseases classified elsewhere: Secondary | ICD-10-CM | POA: Diagnosis present

## 2020-12-17 DIAGNOSIS — E871 Hypo-osmolality and hyponatremia: Secondary | ICD-10-CM | POA: Diagnosis present

## 2020-12-17 DIAGNOSIS — Z8701 Personal history of pneumonia (recurrent): Secondary | ICD-10-CM

## 2020-12-17 DIAGNOSIS — R748 Abnormal levels of other serum enzymes: Secondary | ICD-10-CM | POA: Diagnosis present

## 2020-12-17 DIAGNOSIS — I700 Atherosclerosis of aorta: Secondary | ICD-10-CM | POA: Diagnosis present

## 2020-12-17 DIAGNOSIS — J9600 Acute respiratory failure, unspecified whether with hypoxia or hypercapnia: Secondary | ICD-10-CM | POA: Diagnosis present

## 2020-12-17 DIAGNOSIS — M1990 Unspecified osteoarthritis, unspecified site: Secondary | ICD-10-CM | POA: Diagnosis present

## 2020-12-17 DIAGNOSIS — E872 Acidosis: Secondary | ICD-10-CM | POA: Diagnosis present

## 2020-12-17 DIAGNOSIS — L8990 Pressure ulcer of unspecified site, unspecified stage: Secondary | ICD-10-CM | POA: Diagnosis present

## 2020-12-17 DIAGNOSIS — R627 Adult failure to thrive: Secondary | ICD-10-CM | POA: Diagnosis present

## 2020-12-17 DIAGNOSIS — A419 Sepsis, unspecified organism: Secondary | ICD-10-CM | POA: Diagnosis not present

## 2020-12-17 DIAGNOSIS — Z79899 Other long term (current) drug therapy: Secondary | ICD-10-CM

## 2020-12-17 DIAGNOSIS — Z452 Encounter for adjustment and management of vascular access device: Secondary | ICD-10-CM

## 2020-12-17 DIAGNOSIS — R601 Generalized edema: Secondary | ICD-10-CM | POA: Diagnosis present

## 2020-12-17 DIAGNOSIS — N390 Urinary tract infection, site not specified: Secondary | ICD-10-CM

## 2020-12-17 DIAGNOSIS — Z66 Do not resuscitate: Secondary | ICD-10-CM | POA: Diagnosis not present

## 2020-12-17 DIAGNOSIS — Z6841 Body Mass Index (BMI) 40.0 and over, adult: Secondary | ICD-10-CM

## 2020-12-17 DIAGNOSIS — E875 Hyperkalemia: Secondary | ICD-10-CM | POA: Diagnosis present

## 2020-12-17 DIAGNOSIS — Z515 Encounter for palliative care: Secondary | ICD-10-CM

## 2020-12-17 DIAGNOSIS — N179 Acute kidney failure, unspecified: Secondary | ICD-10-CM | POA: Diagnosis present

## 2020-12-17 DIAGNOSIS — E785 Hyperlipidemia, unspecified: Secondary | ICD-10-CM | POA: Diagnosis present

## 2020-12-17 DIAGNOSIS — I480 Paroxysmal atrial fibrillation: Secondary | ICD-10-CM | POA: Diagnosis present

## 2020-12-17 DIAGNOSIS — Y95 Nosocomial condition: Secondary | ICD-10-CM | POA: Diagnosis present

## 2020-12-17 DIAGNOSIS — E876 Hypokalemia: Secondary | ICD-10-CM | POA: Diagnosis present

## 2020-12-17 DIAGNOSIS — L89022 Pressure ulcer of left elbow, stage 2: Secondary | ICD-10-CM | POA: Diagnosis present

## 2020-12-17 DIAGNOSIS — I10 Essential (primary) hypertension: Secondary | ICD-10-CM | POA: Diagnosis present

## 2020-12-17 DIAGNOSIS — L89322 Pressure ulcer of left buttock, stage 2: Secondary | ICD-10-CM | POA: Diagnosis present

## 2020-12-17 DIAGNOSIS — R4182 Altered mental status, unspecified: Principal | ICD-10-CM

## 2020-12-17 DIAGNOSIS — Z20822 Contact with and (suspected) exposure to covid-19: Secondary | ICD-10-CM | POA: Diagnosis present

## 2020-12-17 DIAGNOSIS — L89312 Pressure ulcer of right buttock, stage 2: Secondary | ICD-10-CM | POA: Diagnosis present

## 2020-12-17 DIAGNOSIS — E8809 Other disorders of plasma-protein metabolism, not elsewhere classified: Secondary | ICD-10-CM | POA: Diagnosis present

## 2020-12-17 DIAGNOSIS — Z4659 Encounter for fitting and adjustment of other gastrointestinal appliance and device: Secondary | ICD-10-CM

## 2020-12-17 DIAGNOSIS — E119 Type 2 diabetes mellitus without complications: Secondary | ICD-10-CM | POA: Diagnosis present

## 2020-12-17 DIAGNOSIS — J189 Pneumonia, unspecified organism: Secondary | ICD-10-CM

## 2020-12-17 DIAGNOSIS — G0401 Postinfectious acute disseminated encephalitis and encephalomyelitis (postinfectious ADEM): Secondary | ICD-10-CM | POA: Diagnosis present

## 2020-12-17 DIAGNOSIS — J690 Pneumonitis due to inhalation of food and vomit: Secondary | ICD-10-CM | POA: Diagnosis present

## 2020-12-17 DIAGNOSIS — R0602 Shortness of breath: Secondary | ICD-10-CM

## 2020-12-17 DIAGNOSIS — R7401 Elevation of levels of liver transaminase levels: Secondary | ICD-10-CM

## 2020-12-17 DIAGNOSIS — Z931 Gastrostomy status: Secondary | ICD-10-CM

## 2020-12-17 DIAGNOSIS — U099 Post covid-19 condition, unspecified: Secondary | ICD-10-CM | POA: Diagnosis present

## 2020-12-17 DIAGNOSIS — B965 Pseudomonas (aeruginosa) (mallei) (pseudomallei) as the cause of diseases classified elsewhere: Secondary | ICD-10-CM | POA: Diagnosis present

## 2020-12-17 DIAGNOSIS — G9341 Metabolic encephalopathy: Secondary | ICD-10-CM | POA: Diagnosis present

## 2020-12-17 DIAGNOSIS — Z888 Allergy status to other drugs, medicaments and biological substances status: Secondary | ICD-10-CM

## 2020-12-17 LAB — URINALYSIS, COMPLETE (UACMP) WITH MICROSCOPIC
Bilirubin Urine: NEGATIVE
Glucose, UA: NEGATIVE mg/dL
Ketones, ur: NEGATIVE mg/dL
Nitrite: NEGATIVE
Protein, ur: 100 mg/dL — AB
RBC / HPF: >50 RBC/hpf — ABNORMAL HIGH (ref 0–5)
Specific Gravity, Urine: 1.013 (ref 1.005–1.030)
WBC, UA: >50 WBC/hpf — ABNORMAL HIGH (ref 0–5)
pH: 5 (ref 5.0–8.0)

## 2020-12-17 LAB — PROTIME-INR
INR: 1.1 (ref 0.8–1.2)
Prothrombin Time: 13.8 seconds (ref 11.4–15.2)

## 2020-12-17 LAB — LACTIC ACID, PLASMA: Lactic Acid, Venous: 1.1 mmol/L (ref 0.5–1.9)

## 2020-12-17 IMAGING — DX DG CHEST 1V PORT
1 series · 1 of 1 positions shown · non-contrast
Comparison: chest x-ray [DATE]

CLINICAL DATA: Shortness of breath, altered mental status.

EXAM:
PORTABLE CHEST 1 VIEW

[chest ap]
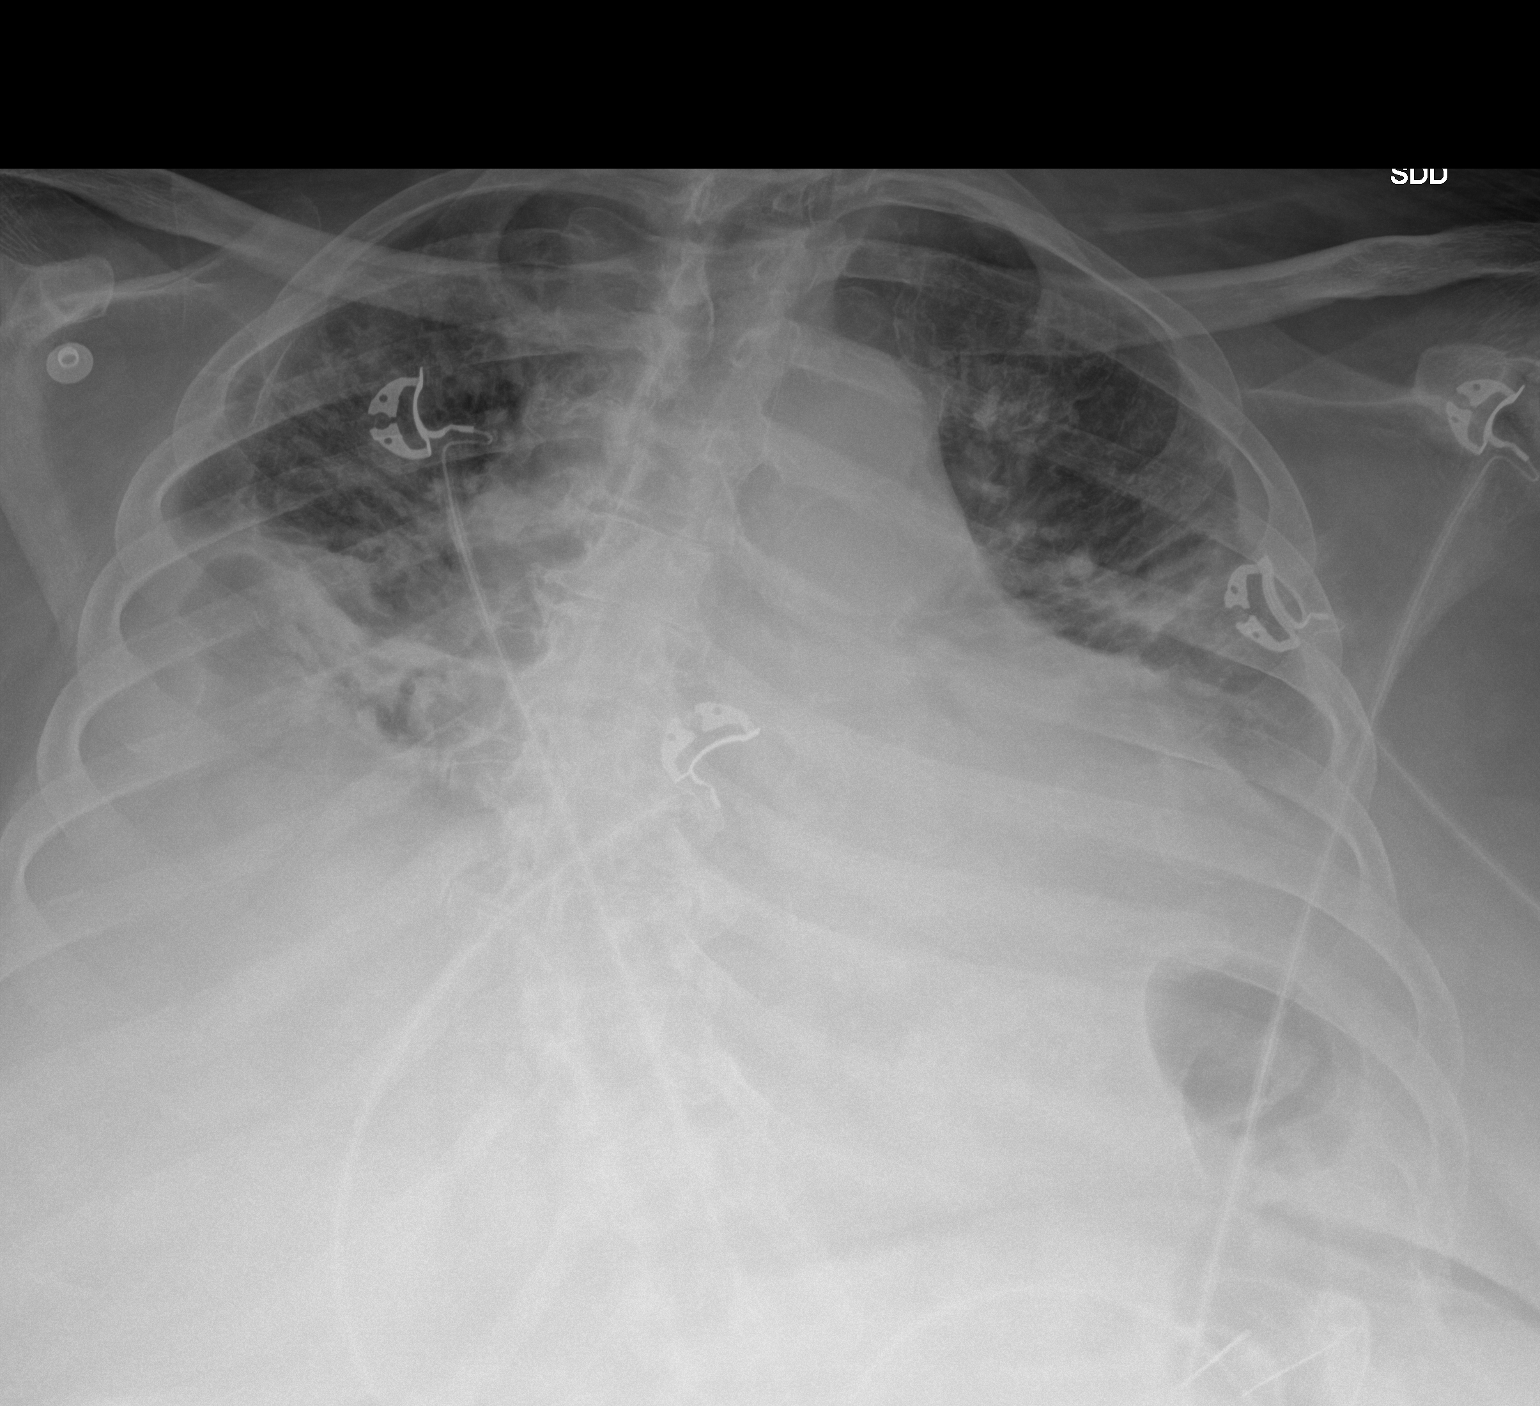

[1 of 1 positions shown; findings below may reference images not displayed]

FINDINGS: Interval removal of left internal jugular central venous catheter
and right subclavian catheter.

The heart size and mediastinal contours are unchanged.

Low lung volumes. Interval development of bilateral trace to small
volume pleural effusions, right greater than left. Interval increase
in patchy airspace opacities of bilateral mid to lower lung zones.
Increased interstitial markings within bilateral upper lung zones.
No pneumothorax.

No acute osseous abnormality.
IMPRESSION: Diffuse patchy airspace opacities suggestive of
infection/inflammation with associated bilateral trace to small view
volume pleural effusions and likely superimposed pulmonary edema.

## 2020-12-17 MED ORDER — ALBUTEROL SULFATE HFA 108 (90 BASE) MCG/ACT IN AERS
2.0000 | INHALATION_SPRAY | RESPIRATORY_TRACT | Status: DC | PRN
  Filled 2020-12-17 (×2): qty 6.7

## 2020-12-17 MED ORDER — SODIUM CHLORIDE 0.9 % IV SOLN
2.0000 g | Freq: Once | INTRAVENOUS | Status: AC
  Administered 2020-12-18: 2 g via INTRAVENOUS
  Filled 2020-12-17: qty 2

## 2020-12-17 MED ORDER — VANCOMYCIN HCL IN DEXTROSE 1-5 GM/200ML-% IV SOLN: 1000.0000 mg | Freq: Once | INTRAVENOUS | Status: DC

## 2020-12-17 NOTE — ED Notes (Signed)
Attempted US IV without success. IV team consult placed 

## 2020-12-17 NOTE — ED Notes (Signed)
UTA blood cultures from IV start. Lab called for assistance.

## 2020-12-17 NOTE — ED Triage Notes (Signed)
Pt from home via EMS, per EMS pt's daughter called for concern over "hearnig a noise" and thinking pt was having trouble breathing. Pt non-verbal on arrival, per EMS daughter reports she has been at this as baseline since she has returned home from hospital yesterday.

## 2020-12-17 NOTE — ED Notes (Signed)
Lab at bedside

## 2020-12-17 NOTE — ED Provider Notes (Incomplete)
[facility] Emergency Department [name] Note  {** REMINDER - THIS NOTE IS NOT A FINAL MEDICAL RECORD UNTIL IT IS SIGNED.  UNTIL THEN, THE CONTENT BELOW MAY REFLECT INFORMATION FROM A DOCUMENTATION TEMPLATE, NOT THE ACTUAL PATIENT VISIT. **}  ____________________________________________   I have reviewed the triage vital signs and the nursing notes.   HISTORY  Chief Complaint Shortness of Breath and Altered Mental Status   History limited by: Altered Mental Status   HPI Chelsea Glass is a 65 y.o. female who presents to the emergency department today from home because of concern for altered mental status.  The patient is unable to give any history.  Per chart review she did have a recent complicated admission to the hospital.  Was found to be hypokalemic, failure to thrive, AKI, A. fib, anemia.  Per EMS the daughter states that the patient has been essentially nonverbal since discharge from the hospital.   Records reviewed.   Past Medical History:  Diagnosis Date  . Arthritis   . Diabetes mellitus without complication (HCC)   . Hypertension     Patient Active Problem List   Diagnosis Date Noted  . Pressure injury of skin 12/16/2020  . Tachycardia   . Weight loss   . Adult failure to thrive   . Depression   . Acute kidney failure, unspecified (HCC) 12/03/2020  . Postmenopausal bleeding   . Hypertension   . Acute respiratory failure with hypoxia (HCC) 06/13/2020  . Obesity, Class III, BMI 40-49.9 (morbid obesity) (HCC) 06/13/2020  . Severe sepsis (HCC) 06/13/2020  . Atrial fibrillation (HCC) 06/13/2020  . Pneumonia due to COVID-19 virus 06/12/2020    Past Surgical History:  Procedure Laterality Date  . IR FLUORO GUIDE CV LINE RIGHT  12/09/2020  . IR GASTROSTOMY TUBE MOD SED  12/09/2020  . TUBAL LIGATION    . UTERINE FIBROID SURGERY      Prior to Admission medications   Medication Sig Start Date End Date Taking? Authorizing Margeret Stachnik   amiodarone (PACERONE) 200 MG tablet Take 1 tablet (200 mg total) by mouth daily. 12/06/2020   Enedina Finner, MD  ascorbic acid (VITAMIN C) 250 MG tablet Place 1 tablet (250 mg total) into feeding tube 2 (two) times daily. 12/16/20   Enedina Finner, MD  diclofenac Sodium (VOLTAREN) 1 % GEL Apply 2 g topically 4 (four) times daily. 12/16/20   Enedina Finner, MD  DULoxetine (CYMBALTA) 30 MG capsule Take 30 mg by mouth daily. 11/18/20   Quinnlan Abruzzo, Historical, MD  esomeprazole (NEXIUM) 20 MG capsule Take 1 capsule (20 mg total) by mouth daily at 12 noon. 12/16/20   Enedina Finner, MD  folic acid (FOLVITE) 1 MG tablet Place 1 tablet (1 mg total) into feeding tube daily. 01/02/2021   Enedina Finner, MD  lidocaine (LIDODERM) 5 % Place 1 patch onto the skin daily. Remove & Discard patch within 12 hours or as directed by MD 12/16/20   Enedina Finner, MD  liver oil-zinc oxide (DESITIN) 40 % ointment Apply topically 3 (three) times daily as needed for irritation. 12/16/20   Enedina Finner, MD  metoCLOPramide (REGLAN) 5 MG/5ML solution Take 5 mLs (5 mg total) by mouth 3 (three) times daily before meals. 12/16/20   Enedina Finner, MD  metoprolol succinate (TOPROL-XL) 25 MG 24 hr tablet Take 1 tablet (25 mg total) by mouth daily. 01/04/2021   Enedina Finner, MD  midodrine (PROAMATINE) 10 MG tablet Place 1 tablet (10 mg total) into feeding tube 3 (three) times  daily with meals. 12/16/20   Enedina Finner, MD  mirtazapine (REMERON SOL-TAB) 45 MG disintegrating tablet Take 1 tablet (45 mg total) by mouth at bedtime. 12/16/20   Enedina Finner, MD  Nutritional Supplements (FEEDING SUPPLEMENT, OSMOLITE 1.5 CAL,) LIQD Place 1,000 mLs into feeding tube continuous. 12/16/20   Enedina Finner, MD  Nutritional Supplements (FEEDING SUPPLEMENT, PROSOURCE TF,) liquid Place 45 mLs into feeding tube 2 (two) times daily. 12/16/20   Enedina Finner, MD  OLANZapine (ZYPREXA) 5 MG tablet Place 1 tablet (5 mg total) into feeding tube at bedtime. 12/16/20   Enedina Finner, MD  polyethylene glycol  (MIRALAX / GLYCOLAX) 17 g packet Place 17 g into feeding tube 2 (two) times daily. 12/16/20   Enedina Finner, MD  traMADol (ULTRAM) 50 MG tablet Take 1 tablet (50 mg total) by mouth every 12 (twelve) hours as needed for moderate pain or severe pain. 12/16/20   Enedina Finner, MD  Water For Irrigation, Sterile (FREE WATER) SOLN Place 100 mLs into feeding tube 6 (six) times daily. 12/16/20   Enedina Finner, MD    Allergies Lisinopril  No family history on file.  Social History Social History   Tobacco Use  . Smoking status: Never Smoker  . Smokeless tobacco: Never Used  Substance Use Topics  . Alcohol use: No  . Drug use: No    Review of Systems Unable to obtain secondary to AMS. ____________________________________________   PHYSICAL EXAM:  VITAL SIGNS: ED Triage Vitals  Enc Vitals Group     BP 12/21/2020 2253 130/82     Pulse Rate 12/11/2020 2243 83     Resp 12/08/2020 2243 20     Temp 12/24/2020 2259 99.5 F (37.5 C)     Temp Source 12/31/2020 2259 Rectal     SpO2 12/11/2020 2243 100 %    Constitutional: Awake and alert. Not oriented.  Eyes: Conjunctivae are normal.  ENT      Head: Normocephalic and atraumatic.      Nose: No congestion/rhinnorhea.      Mouth/Throat: Mucous membranes are moist.      Neck: No stridor. Hematological/Lymphatic/Immunilogical: No cervical lymphadenopathy. Cardiovascular: Normal rate, irregular rhythm.  No murmurs, rubs, or gallops.  Respiratory: Slightly increased respiratory rate. No crackles or rhonchi appreciated.  Gastrointestinal: Soft and non tender. No rebound. No guarding.  Genitourinary: Deferred Musculoskeletal: Normal range of motion in all extremities. No lower extremity edema. Neurologic:  Normal speech and language. No gross focal neurologic deficits are appreciated.  Skin:  Skin is warm, dry and intact. No rash noted. Psychiatric: Mood and affect are normal. Speech and behavior are normal. Patient exhibits appropriate insight and  judgment.  ____________________________________________    LABS (pertinent positives/negatives)  CBC wbc 14.0, hgb 8.0, plt 284 UA turbid, large leukocytes, RBC and WBC >50 PH 7.22  ____________________________________________   EKG  I, Phineas Semen, attending physician, personally viewed and interpreted this EKG  EKG Time: 2249 Rate: 88 Rhythm: atrial fibrillation Axis: normal Intervals: qtc 495 QRS: IVCD ST changes: no st elevation Impression: abnormal ekg ____________________________________________    RADIOLOGY  CXR Diffuse airspace opacities  ____________________________________________   PROCEDURES  Procedures  ____________________________________________   INITIAL IMPRESSION / ASSESSMENT AND PLAN / ED COURSE  Pertinent labs & imaging results that were available during my care of the patient were reviewed by me and considered in my medical decision making (see chart for details).   ***  Discussed with patient/family results of testing/physical exam, differential plan and return precautions.  ____________________________________________   FINAL CLINICAL IMPRESSION(S) / ED DIAGNOSES  Final diagnoses:  None     Note: This dictation was prepared with Dragon dictation. Any transcriptional errors that result from this process are unintentional

## 2020-12-17 NOTE — ED Provider Notes (Addendum)
[facility] Emergency Department Provider Note    ____________________________________________   I have reviewed the triage vital signs and the nursing notes.   HISTORY  Chief Complaint Shortness of Breath and Altered Mental Status   History limited by: Altered Mental Status   HPI Chelsea Glass is a 65 y.o. female who presents to the emergency department today from home because of concern for altered mental status.  The patient is unable to give any history.  Per chart review she did have a recent complicated admission to the hospital.  Was found to be hypokalemic, failure to thrive, AKI, A. fib, anemia.  Per EMS the daughter states that the patient has been essentially nonverbal since discharge from the hospital.   Records reviewed.   Past Medical History:  Diagnosis Date  . Arthritis   . Diabetes mellitus without complication (HCC)   . Hypertension     Patient Active Problem List   Diagnosis Date Noted  . Pressure injury of skin 12/16/2020  . Tachycardia   . Weight loss   . Adult failure to thrive   . Depression   . Acute kidney failure, unspecified (HCC) 12/03/2020  . Postmenopausal bleeding   . Hypertension   . Acute respiratory failure with hypoxia (HCC) 06/13/2020  . Obesity, Class III, BMI 40-49.9 (morbid obesity) (HCC) 06/13/2020  . Severe sepsis (HCC) 06/13/2020  . Atrial fibrillation (HCC) 06/13/2020  . Pneumonia due to COVID-19 virus 06/12/2020    Past Surgical History:  Procedure Laterality Date  . IR FLUORO GUIDE CV LINE RIGHT  12/09/2020  . IR GASTROSTOMY TUBE MOD SED  12/09/2020  . TUBAL LIGATION    . UTERINE FIBROID SURGERY      Prior to Admission medications   Medication Sig Start Date End Date Taking? Authorizing Provider  amiodarone (PACERONE) 200 MG tablet Take 1 tablet (200 mg total) by mouth daily. 12/11/2020   Enedina Finner, MD  ascorbic acid (VITAMIN C) 250 MG tablet Place 1 tablet (250 mg total) into feeding tube 2  (two) times daily. 12/16/20   Enedina Finner, MD  diclofenac Sodium (VOLTAREN) 1 % GEL Apply 2 g topically 4 (four) times daily. 12/16/20   Enedina Finner, MD  DULoxetine (CYMBALTA) 30 MG capsule Take 30 mg by mouth daily. 11/18/20   [provider]  esomeprazole (NEXIUM) 20 MG capsule Take 1 capsule (20 mg total) by mouth daily at 12 noon. 12/16/20   Enedina Finner, MD  folic acid (FOLVITE) 1 MG tablet Place 1 tablet (1 mg total) into feeding tube daily. 01/05/2021   Enedina Finner, MD  lidocaine (LIDODERM) 5 % Place 1 patch onto the skin daily. Remove & Discard patch within 12 hours or as directed by MD 12/16/20   Enedina Finner, MD  liver oil-zinc oxide (DESITIN) 40 % ointment Apply topically 3 (three) times daily as needed for irritation. 12/16/20   Enedina Finner, MD  metoCLOPramide (REGLAN) 5 MG/5ML solution Take 5 mLs (5 mg total) by mouth 3 (three) times daily before meals. 12/16/20   Enedina Finner, MD  metoprolol succinate (TOPROL-XL) 25 MG 24 hr tablet Take 1 tablet (25 mg total) by mouth daily. 12/20/2020   Enedina Finner, MD  midodrine (PROAMATINE) 10 MG tablet Place 1 tablet (10 mg total) into feeding tube 3 (three) times daily with meals. 12/16/20   Enedina Finner, MD  mirtazapine (REMERON SOL-TAB) 45 MG disintegrating tablet Take 1 tablet (45 mg total) by mouth at bedtime. 12/16/20   Enedina Finner, MD  Nutritional Supplements (FEEDING SUPPLEMENT, OSMOLITE 1.5 CAL,) LIQD Place 1,000 mLs into feeding tube continuous. 12/16/20   Enedina Finner, MD  Nutritional Supplements (FEEDING SUPPLEMENT, PROSOURCE TF,) liquid Place 45 mLs into feeding tube 2 (two) times daily. 12/16/20   Enedina Finner, MD  OLANZapine (ZYPREXA) 5 MG tablet Place 1 tablet (5 mg total) into feeding tube at bedtime. 12/16/20   Enedina Finner, MD  polyethylene glycol (MIRALAX / GLYCOLAX) 17 g packet Place 17 g into feeding tube 2 (two) times daily. 12/16/20   Enedina Finner, MD  traMADol (ULTRAM) 50 MG tablet Take 1 tablet (50 mg total) by mouth every 12 (twelve) hours  as needed for moderate pain or severe pain. 12/16/20   Enedina Finner, MD  Water For Irrigation, Sterile (FREE WATER) SOLN Place 100 mLs into feeding tube 6 (six) times daily. 12/16/20   Enedina Finner, MD    Allergies Lisinopril  No family history on file.  Social History Social History   Tobacco Use  . Smoking status: Never Smoker  . Smokeless tobacco: Never Used  Substance Use Topics  . Alcohol use: No  . Drug use: No    Review of Systems Unable to obtain secondary to AMS. ____________________________________________   PHYSICAL EXAM:  VITAL SIGNS: ED Triage Vitals  Enc Vitals Group     BP 25-Dec-2020 2253 130/82     Pulse Rate 2020/12/25 2243 83     Resp 2020/12/25 2243 20     Temp 2020/12/25 2259 99.5 F (37.5 C)     Temp Source 12-25-20 2259 Rectal     SpO2 December 25, 2020 2243 100 %    Constitutional: Awake and alert. Not oriented.  Eyes: Conjunctivae are normal.  ENT      Head: Normocephalic and atraumatic.      Nose: No congestion/rhinnorhea.      Mouth/Throat: Mucous membranes are moist.      Neck: No stridor. Hematological/Lymphatic/Immunilogical: No cervical lymphadenopathy. Cardiovascular: Normal rate, irregular rhythm.  No murmurs, rubs, or gallops.  Respiratory: Slightly increased respiratory rate. No crackles or rhonchi appreciated.  Gastrointestinal: Soft and non tender. No rebound. No guarding.  Genitourinary: Deferred Musculoskeletal: Bilateral lower extremity edema.  Neurologic:  Awake and alert. Essentially non verbal. Appears to move all extremities.  Skin:  Diffuse rash to pannus and gluteal fold.   ____________________________________________    LABS (pertinent positives/negatives)  CBC wbc 14.0, hgb 8.0, plt 284 UA turbid, large leukocytes, RBC and WBC >50 PH 7.22  ____________________________________________   EKG  I, Phineas Semen, attending physician, personally viewed and interpreted this EKG  EKG Time: 2249 Rate: 88 Rhythm: atrial  fibrillation Axis: normal Intervals: qtc 495 QRS: IVCD ST changes: no st elevation Impression: abnormal ekg ____________________________________________    RADIOLOGY  CXR Diffuse airspace opacities  ____________________________________________   PROCEDURES  Procedures  CRITICAL CARE Performed by: Phineas Semen   Total critical care time: 40 minutes  Critical care time was exclusive of separately billable procedures and treating other patients.  Critical care was necessary to treat or prevent imminent or life-threatening deterioration.  Critical care was time spent personally by me on the following activities: development of treatment plan with patient and/or surrogate as well as nursing, discussions with consultants, evaluation of patient's response to treatment, examination of patient, obtaining history from patient or surrogate, ordering and performing treatments and interventions, ordering and review of laboratory studies, ordering and review of radiographic studies, pulse oximetry and re-evaluation of patient's condition.  ____________________________________________   INITIAL IMPRESSION / ASSESSMENT  AND PLAN / ED COURSE  Pertinent labs & imaging results that were available during my care of the patient were reviewed by me and considered in my medical decision making (see chart for details).   Patient presented to the emergency department today via EMS because of concerns for respiratory distress.  On exam patient is awake and alert.  She is essentially nonverbal.  It does appear that she has slightly increased respiratory distress.  No rhonchi or crackles were appreciated although exam is limited secondary to body habitus.  Did have concern for possible sepsis given altered mental status and increased respiratory rate.  Chest x-ray is concerning for possible pneumonia.  Because of recent hospitalization patient was started on broad-spectrum antibiotics.  Additionally  urine is concerning for urinary tract infection. Patient will require readmission to the hospital.    ____________________________________________   FINAL CLINICAL IMPRESSION(S) / ED DIAGNOSES  Final diagnoses:  Altered mental status, unspecified altered mental status type  Lower urinary tract infectious disease  Pneumonia due to infectious organism, unspecified laterality, unspecified part of lung     Note: This dictation was prepared with Dragon dictation. Any transcriptional errors that result from this process are unintentional     Phineas Semen, MD 12/18/20 0007    Phineas Semen, MD 12/18/20 [phone]

## 2020-12-17 NOTE — ED Notes (Signed)
Multiple wounds noted during assessment/cath/rectal temp. Pt had wound dressing below umbilicus with green drainage noted. Dressings removed, pt given CHG bath, clean gauze placed over wounds.

## 2020-12-18 ENCOUNTER — Inpatient Hospital Stay: Payer: Medicare Other

## 2020-12-18 ENCOUNTER — Encounter: Payer: Self-pay | Admitting: Pulmonary Disease

## 2020-12-18 DIAGNOSIS — Y95 Nosocomial condition: Secondary | ICD-10-CM | POA: Diagnosis present

## 2020-12-18 DIAGNOSIS — J9600 Acute respiratory failure, unspecified whether with hypoxia or hypercapnia: Secondary | ICD-10-CM | POA: Diagnosis present

## 2020-12-18 DIAGNOSIS — E875 Hyperkalemia: Secondary | ICD-10-CM | POA: Diagnosis present

## 2020-12-18 DIAGNOSIS — Z7189 Other specified counseling: Secondary | ICD-10-CM | POA: Diagnosis not present

## 2020-12-18 DIAGNOSIS — L89022 Pressure ulcer of left elbow, stage 2: Secondary | ICD-10-CM | POA: Diagnosis present

## 2020-12-18 DIAGNOSIS — N390 Urinary tract infection, site not specified: Secondary | ICD-10-CM | POA: Diagnosis present

## 2020-12-18 DIAGNOSIS — J189 Pneumonia, unspecified organism: Secondary | ICD-10-CM | POA: Diagnosis not present

## 2020-12-18 DIAGNOSIS — I480 Paroxysmal atrial fibrillation: Secondary | ICD-10-CM | POA: Diagnosis present

## 2020-12-18 DIAGNOSIS — R627 Adult failure to thrive: Secondary | ICD-10-CM | POA: Diagnosis present

## 2020-12-18 DIAGNOSIS — Z8701 Personal history of pneumonia (recurrent): Secondary | ICD-10-CM | POA: Diagnosis not present

## 2020-12-18 DIAGNOSIS — Z66 Do not resuscitate: Secondary | ICD-10-CM | POA: Diagnosis not present

## 2020-12-18 DIAGNOSIS — R4182 Altered mental status, unspecified: Secondary | ICD-10-CM | POA: Diagnosis present

## 2020-12-18 DIAGNOSIS — R401 Stupor: Secondary | ICD-10-CM | POA: Diagnosis not present

## 2020-12-18 DIAGNOSIS — N179 Acute kidney failure, unspecified: Secondary | ICD-10-CM | POA: Diagnosis present

## 2020-12-18 DIAGNOSIS — J690 Pneumonitis due to inhalation of food and vomit: Secondary | ICD-10-CM | POA: Diagnosis present

## 2020-12-18 DIAGNOSIS — E871 Hypo-osmolality and hyponatremia: Secondary | ICD-10-CM | POA: Diagnosis present

## 2020-12-18 DIAGNOSIS — U099 Post covid-19 condition, unspecified: Secondary | ICD-10-CM | POA: Diagnosis present

## 2020-12-18 DIAGNOSIS — L89312 Pressure ulcer of right buttock, stage 2: Secondary | ICD-10-CM | POA: Diagnosis present

## 2020-12-18 DIAGNOSIS — I10 Essential (primary) hypertension: Secondary | ICD-10-CM | POA: Diagnosis present

## 2020-12-18 DIAGNOSIS — E119 Type 2 diabetes mellitus without complications: Secondary | ICD-10-CM | POA: Diagnosis present

## 2020-12-18 DIAGNOSIS — Z20822 Contact with and (suspected) exposure to covid-19: Secondary | ICD-10-CM | POA: Diagnosis present

## 2020-12-18 DIAGNOSIS — Z515 Encounter for palliative care: Secondary | ICD-10-CM | POA: Diagnosis not present

## 2020-12-18 DIAGNOSIS — L89322 Pressure ulcer of left buttock, stage 2: Secondary | ICD-10-CM | POA: Diagnosis present

## 2020-12-18 DIAGNOSIS — G0401 Postinfectious acute disseminated encephalitis and encephalomyelitis (postinfectious ADEM): Secondary | ICD-10-CM | POA: Diagnosis present

## 2020-12-18 DIAGNOSIS — Z6841 Body Mass Index (BMI) 40.0 and over, adult: Secondary | ICD-10-CM | POA: Diagnosis not present

## 2020-12-18 DIAGNOSIS — G9341 Metabolic encephalopathy: Secondary | ICD-10-CM | POA: Diagnosis present

## 2020-12-18 DIAGNOSIS — E872 Acidosis: Secondary | ICD-10-CM | POA: Diagnosis present

## 2020-12-18 DIAGNOSIS — A419 Sepsis, unspecified organism: Secondary | ICD-10-CM | POA: Diagnosis present

## 2020-12-18 LAB — COMPREHENSIVE METABOLIC PANEL
ALT: 74 U/L — ABNORMAL HIGH (ref 0–44)
ALT: 75 U/L — ABNORMAL HIGH (ref 0–44)
AST: 103 U/L — ABNORMAL HIGH (ref 15–41)
AST: 99 U/L — ABNORMAL HIGH (ref 15–41)
Albumin: 3.2 g/dL — ABNORMAL LOW (ref 3.5–5.0)
Albumin: 3.2 g/dL — ABNORMAL LOW (ref 3.5–5.0)
Alkaline Phosphatase: 1228 U/L — ABNORMAL HIGH (ref 38–126)
Alkaline Phosphatase: 1272 U/L — ABNORMAL HIGH (ref 38–126)
Anion gap: 5 (ref 5–15)
Anion gap: 6 (ref 5–15)
BUN: 50 mg/dL — ABNORMAL HIGH (ref 8–23)
BUN: 50 mg/dL — ABNORMAL HIGH (ref 8–23)
CO2: 20 mmol/L — ABNORMAL LOW (ref 22–32)
CO2: 20 mmol/L — ABNORMAL LOW (ref 22–32)
Calcium: 9.4 mg/dL (ref 8.9–10.3)
Calcium: 9.5 mg/dL (ref 8.9–10.3)
Chloride: 104 mmol/L (ref 98–111)
Chloride: 105 mmol/L (ref 98–111)
Creatinine, Ser: 2.28 mg/dL — ABNORMAL HIGH (ref 0.44–1.00)
Creatinine, Ser: 2.35 mg/dL — ABNORMAL HIGH (ref 0.44–1.00)
GFR, Estimated: 22 mL/min — ABNORMAL LOW (ref >60)
GFR, Estimated: 23 mL/min — ABNORMAL LOW (ref >60)
Glucose, Bld: 166 mg/dL — ABNORMAL HIGH (ref 70–99)
Glucose, Bld: 177 mg/dL — ABNORMAL HIGH (ref 70–99)
Potassium: 6.2 mmol/L — ABNORMAL HIGH (ref 3.5–5.1)
Potassium: 6.3 mmol/L (ref 3.5–5.1)
Sodium: 130 mmol/L — ABNORMAL LOW (ref 135–145)
Sodium: 130 mmol/L — ABNORMAL LOW (ref 135–145)
Total Bilirubin: 1 mg/dL (ref 0.3–1.2)
Total Bilirubin: 1 mg/dL (ref 0.3–1.2)
Total Protein: 5.7 g/dL — ABNORMAL LOW (ref 6.5–8.1)
Total Protein: 5.8 g/dL — ABNORMAL LOW (ref 6.5–8.1)

## 2020-12-18 LAB — PHOSPHORUS: Phosphorus: 4.2 mg/dL (ref 2.5–4.6)

## 2020-12-18 LAB — CBC WITH DIFFERENTIAL/PLATELET
Abs Immature Granulocytes: 1.11 10*3/uL — ABNORMAL HIGH (ref 0.00–0.07)
Basophils Absolute: 0.1 10*3/uL (ref 0.0–0.1)
Basophils Relative: 0 %
Eosinophils Absolute: 0 10*3/uL (ref 0.0–0.5)
Eosinophils Relative: 0 %
HCT: 24.3 % — ABNORMAL LOW (ref 36.0–46.0)
Hemoglobin: 8 g/dL — ABNORMAL LOW (ref 12.0–15.0)
Immature Granulocytes: 8 %
Lymphocytes Relative: 16 %
Lymphs Abs: 2.2 10*3/uL (ref 0.7–4.0)
MCH: 29.5 pg (ref 26.0–34.0)
MCHC: 32.9 g/dL (ref 30.0–36.0)
MCV: 89.7 fL (ref 80.0–100.0)
Monocytes Absolute: 2.9 10*3/uL — ABNORMAL HIGH (ref 0.1–1.0)
Monocytes Relative: 20 %
Neutro Abs: 7.7 10*3/uL (ref 1.7–7.7)
Neutrophils Relative %: 56 %
Platelets: 284 10*3/uL (ref 150–400)
RBC: 2.71 MIL/uL — ABNORMAL LOW (ref 3.87–5.11)
RDW: 22.9 % — ABNORMAL HIGH (ref 11.5–15.5)
Smear Review: NORMAL
WBC: 14 10*3/uL — ABNORMAL HIGH (ref 4.0–10.5)
nRBC: 2.9 % — ABNORMAL HIGH (ref 0.0–0.2)

## 2020-12-18 LAB — GLUCOSE, CAPILLARY
Glucose-Capillary: 134 mg/dL — ABNORMAL HIGH (ref 70–99)
Glucose-Capillary: 125 mg/dL — ABNORMAL HIGH (ref 70–99)
Glucose-Capillary: 124 mg/dL — ABNORMAL HIGH (ref 70–99)
Glucose-Capillary: 101 mg/dL — ABNORMAL HIGH (ref 70–99)
Glucose-Capillary: 106 mg/dL — ABNORMAL HIGH (ref 70–99)

## 2020-12-18 LAB — LACTIC ACID, PLASMA
Lactic Acid, Venous: 1.1 mmol/L (ref 0.5–1.9)
Lactic Acid, Venous: 1.3 mmol/L (ref 0.5–1.9)

## 2020-12-18 LAB — BASIC METABOLIC PANEL
Anion gap: 6 (ref 5–15)
BUN: 55 mg/dL — ABNORMAL HIGH (ref 8–23)
CO2: 22 mmol/L (ref 22–32)
Calcium: 9.6 mg/dL (ref 8.9–10.3)
Chloride: 104 mmol/L (ref 98–111)
Creatinine, Ser: 2.42 mg/dL — ABNORMAL HIGH (ref 0.44–1.00)
GFR, Estimated: 22 mL/min — ABNORMAL LOW (ref >60)
Glucose, Bld: 125 mg/dL — ABNORMAL HIGH (ref 70–99)
Potassium: 5.7 mmol/L — ABNORMAL HIGH (ref 3.5–5.1)
Sodium: 132 mmol/L — ABNORMAL LOW (ref 135–145)

## 2020-12-18 LAB — CBC
HCT: 24 % — ABNORMAL LOW (ref 36.0–46.0)
Hemoglobin: 7.6 g/dL — ABNORMAL LOW (ref 12.0–15.0)
MCH: 29 pg (ref 26.0–34.0)
MCHC: 31.7 g/dL (ref 30.0–36.0)
MCV: 91.6 fL (ref 80.0–100.0)
Platelets: 277 10*3/uL (ref 150–400)
RBC: 2.62 MIL/uL — ABNORMAL LOW (ref 3.87–5.11)
RDW: 22.6 % — ABNORMAL HIGH (ref 11.5–15.5)
WBC: 13.8 10*3/uL — ABNORMAL HIGH (ref 4.0–10.5)
nRBC: 2.8 % — ABNORMAL HIGH (ref 0.0–0.2)

## 2020-12-18 LAB — RESP PANEL BY RT-PCR (FLU A&B, COVID) ARPGX2
Influenza A by PCR: NEGATIVE
Influenza B by PCR: NEGATIVE
SARS Coronavirus 2 by RT PCR: NEGATIVE

## 2020-12-18 LAB — MRSA PCR SCREENING: MRSA by PCR: NEGATIVE

## 2020-12-18 LAB — MAGNESIUM: Magnesium: 2.2 mg/dL (ref 1.7–2.4)

## 2020-12-18 LAB — CORTISOL: Cortisol, Plasma: 37.4 ug/dL

## 2020-12-18 LAB — BRAIN NATRIURETIC PEPTIDE: B Natriuretic Peptide: 1719.4 pg/mL — ABNORMAL HIGH (ref 0.0–100.0)

## 2020-12-18 IMAGING — DX DG CHEST 1V
1 series · 1 of 1 positions shown · non-contrast
Comparison: Portable chest [DATE] and earlier.

CLINICAL DATA: 64-year-old female central line placement.

EXAM:
CHEST  1 VIEW

[chest ap]
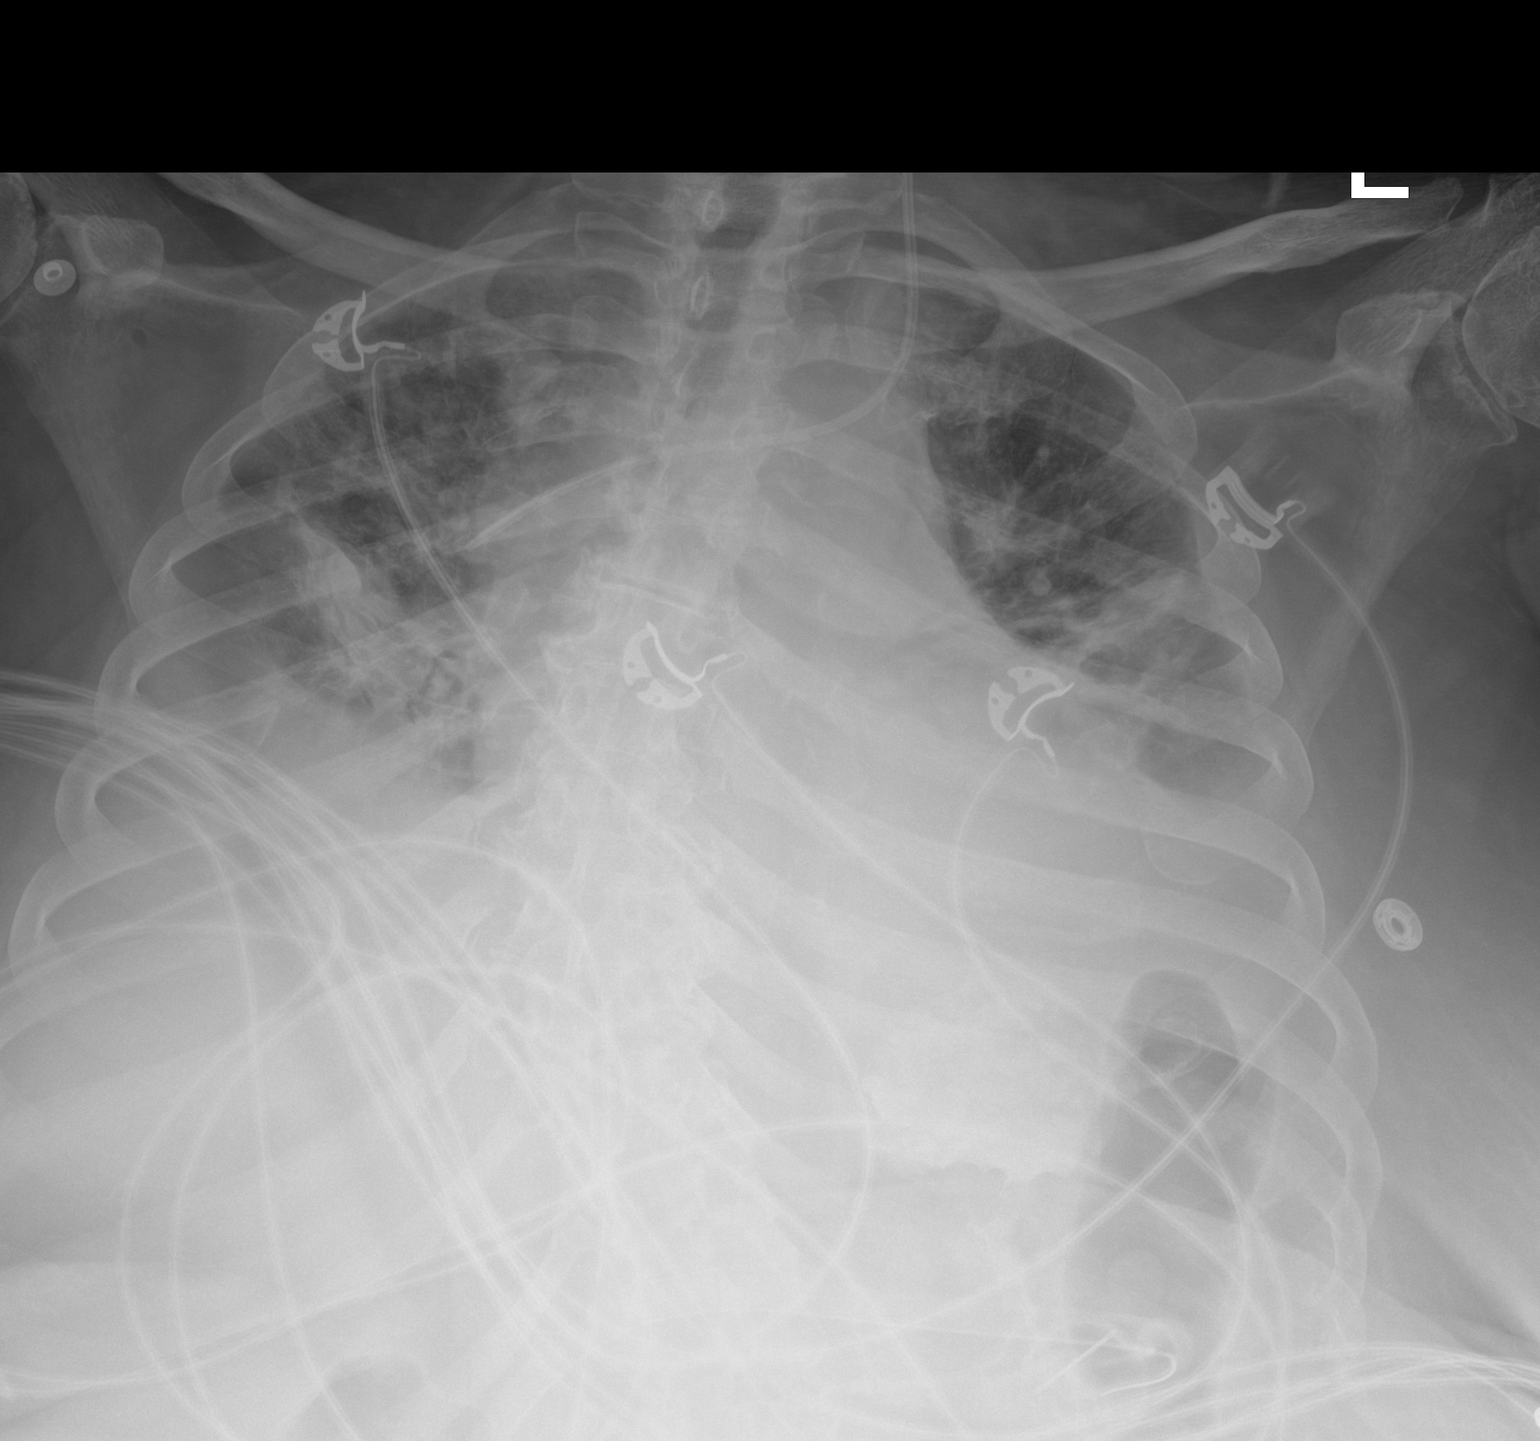

[1 of 1 positions shown; findings below may reference images not displayed]

FINDINGS: Portable AP view at [YM] hours. Left IJ approach central line
placed, tip projects along the lateral margin of the right
mediastinum at the level of the carina. No pneumothorax. Stable
cardiac size and mediastinal contours. Platelike atelectasis in the
right lower lung persists. Additional bilateral veiling pulmonary
opacity. Overall ventilation has not significantly changed from
yesterday. Small volume of oral contrast in the gastric fundus.
Scoliosis. No acute osseous abnormality identified.
IMPRESSION: 1. Left IJ central line placed with tip at the SVC level along the
lateral mediastinal contour. Consider retracting up to 1 cm in case
the catheter tip is up against lateral SVC wall.
2. No pneumothorax.  Stable ventilation.

## 2020-12-18 IMAGING — DX DG ABDOMEN 1V
1 series · 1 of 1 positions shown · non-contrast
Comparison: None

CLINICAL DATA: Nasogastric tube placement

EXAM:
ABDOMEN - 1 VIEW

[abdomen supine]
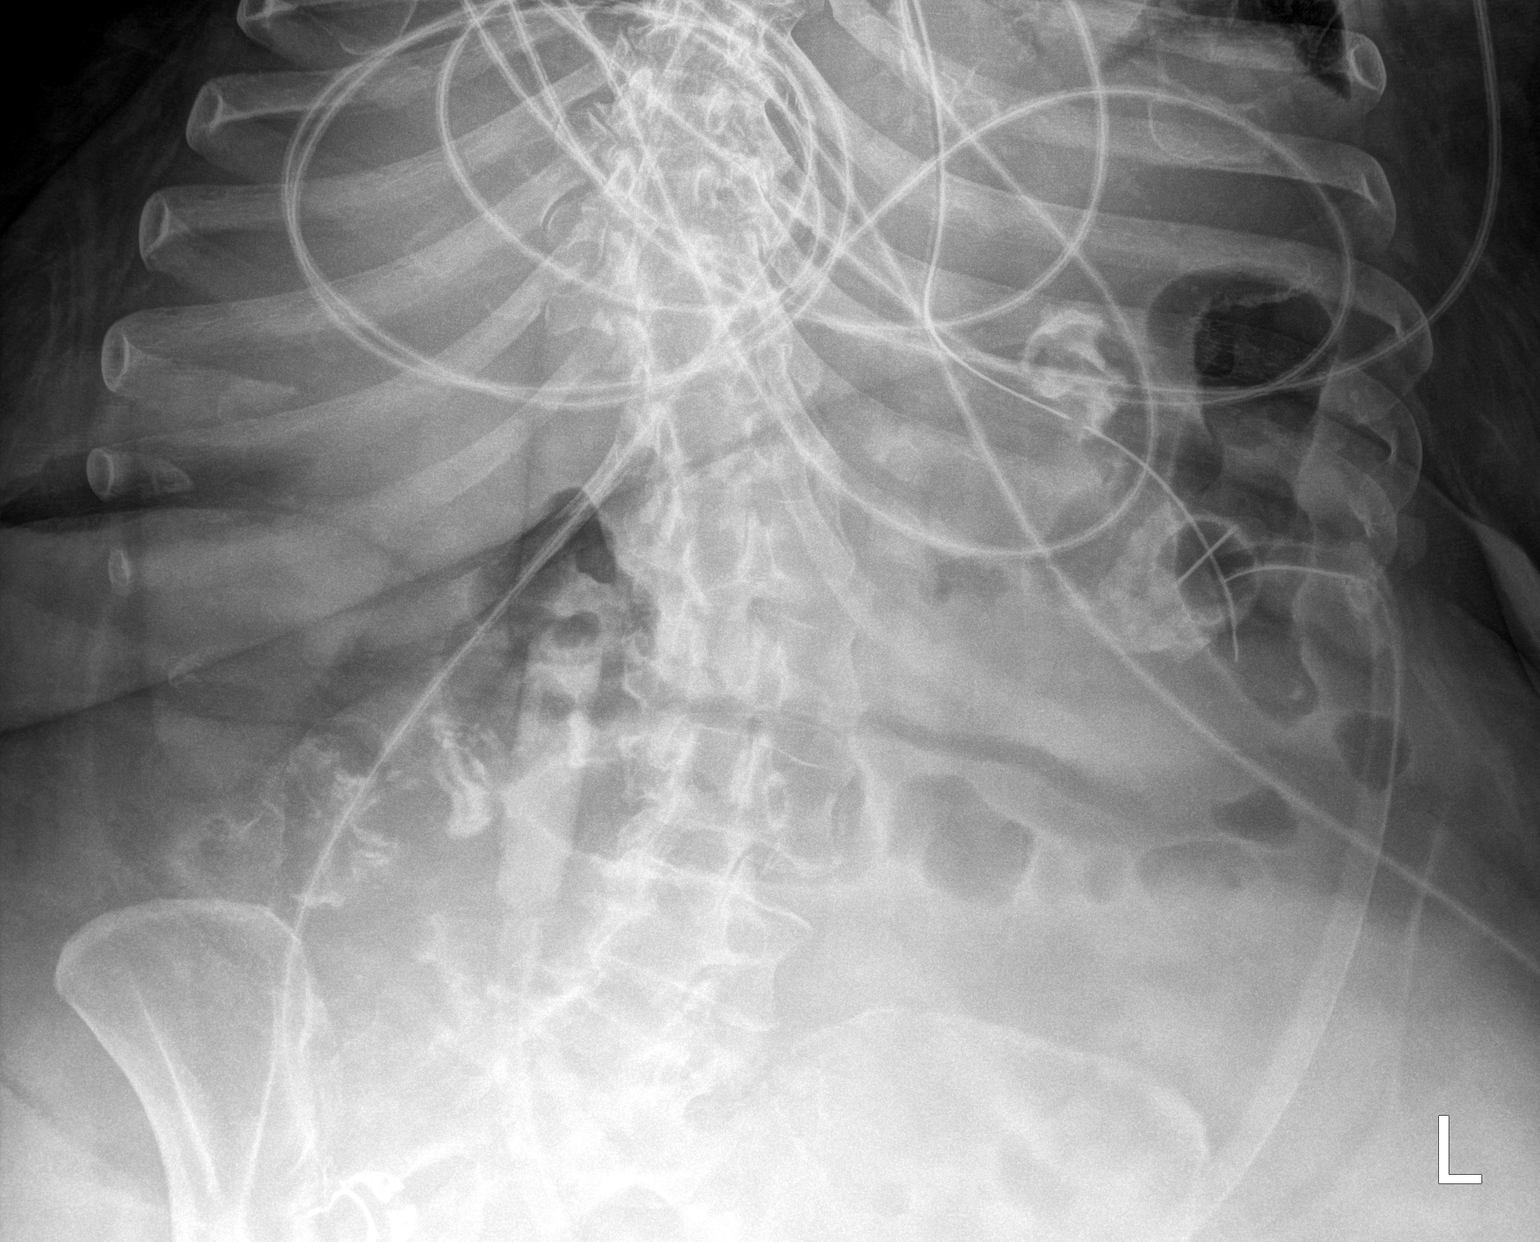

[1 of 1 positions shown; findings below may reference images not displayed]

FINDINGS: Nasogastric tube tip and side port in stomach. Gastrostomy catheter
positioned in stomach as well. No appreciable bowel dilatation or
air-fluid level to suggest bowel obstruction. No free air evident on
supine examination. There is lumbar levoscoliosis with rotatory
component.
IMPRESSION: Nasogastric tube tip and side port in stomach. Gastrostomy
positioned in stomach as well. No bowel obstruction or free air
evident.

## 2020-12-18 IMAGING — US US ABDOMEN LIMITED RUQ/ASCITES
1 series · 14 of 25 positions shown · non-contrast
Comparison: None.

CLINICAL DATA: Elevated liver enzymes

EXAM:
ULTRASOUND ABDOMEN LIMITED RIGHT UPPER QUADRANT

[Series 1: us abdomen limited ruq (liver/gb) · 14 of 33 slices shown]
[im 1/33]
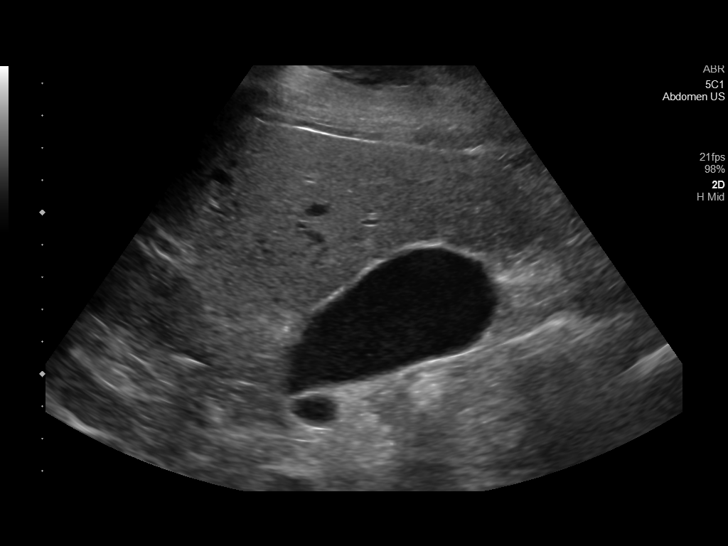
[im 3/33]
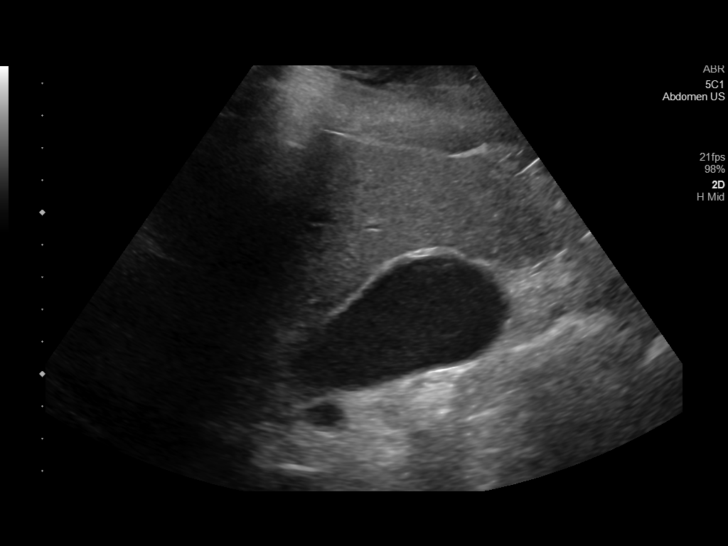
[im 6/33]
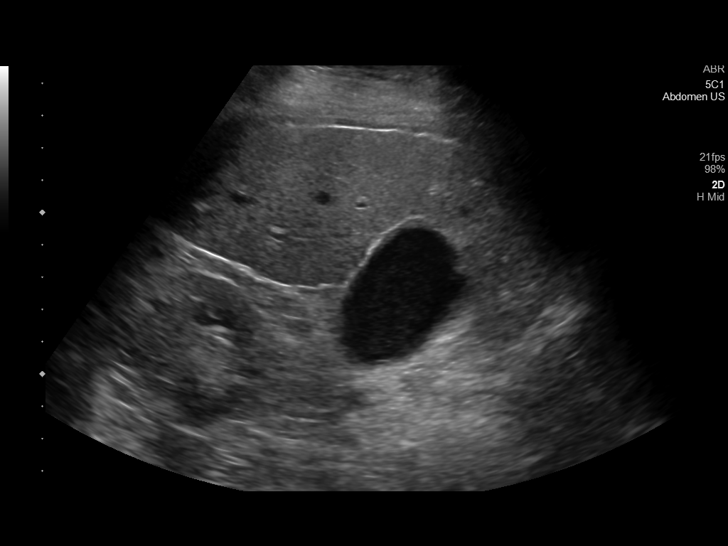
[im 9/33]
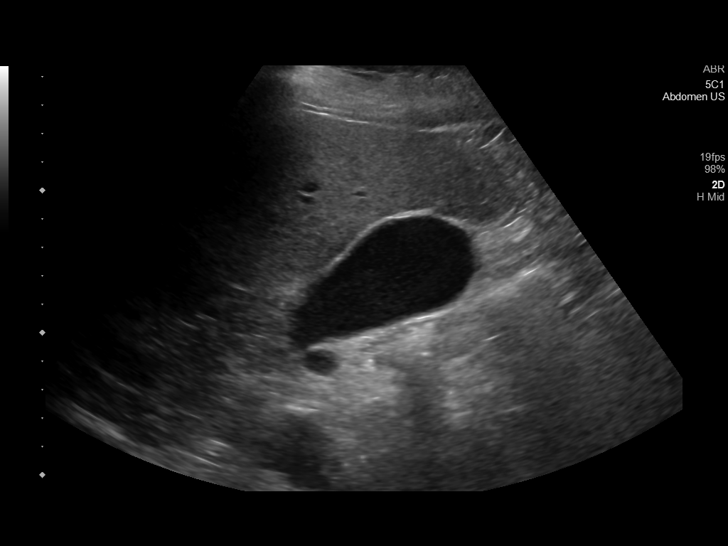
[im 11/33]
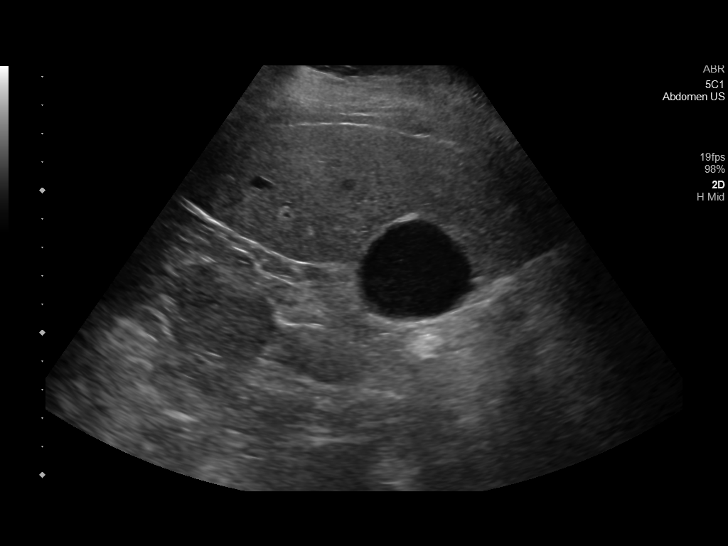
[im 13/33]
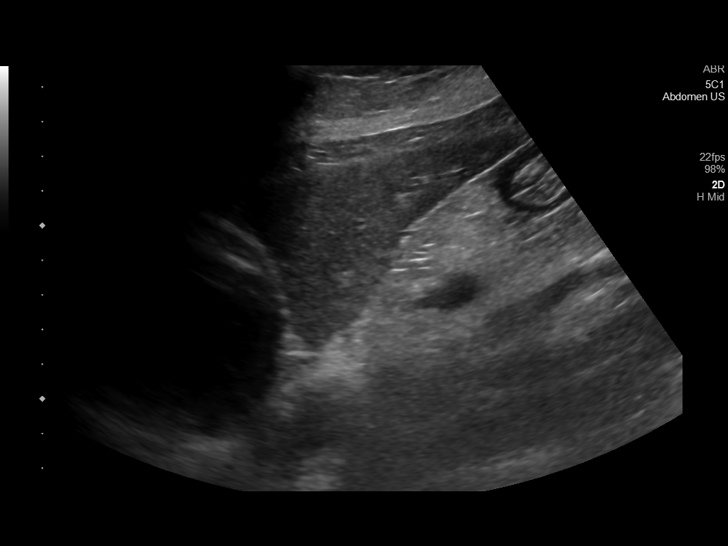
[im 15/33]
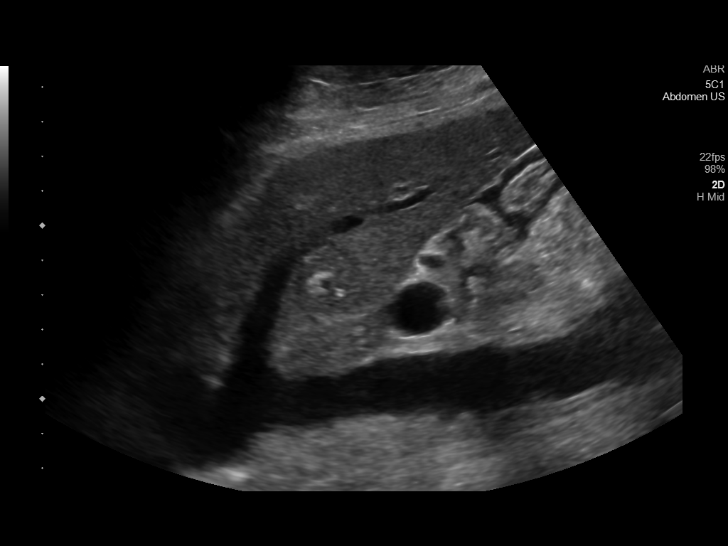
[im 18/33]
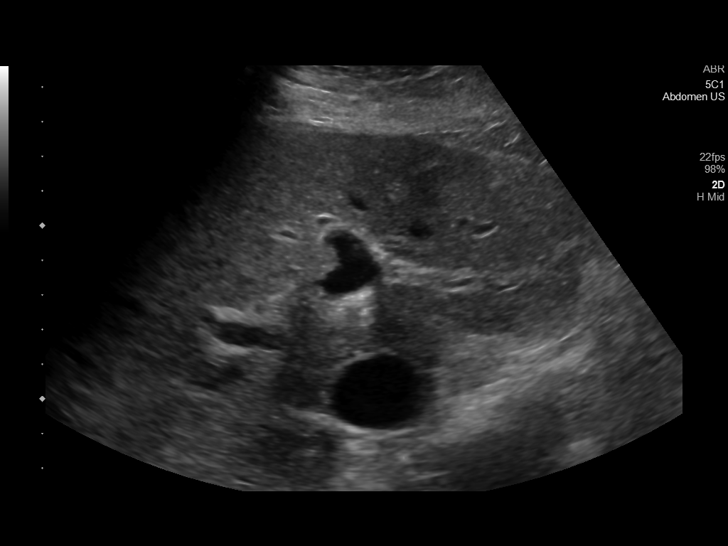
[im 21/33]
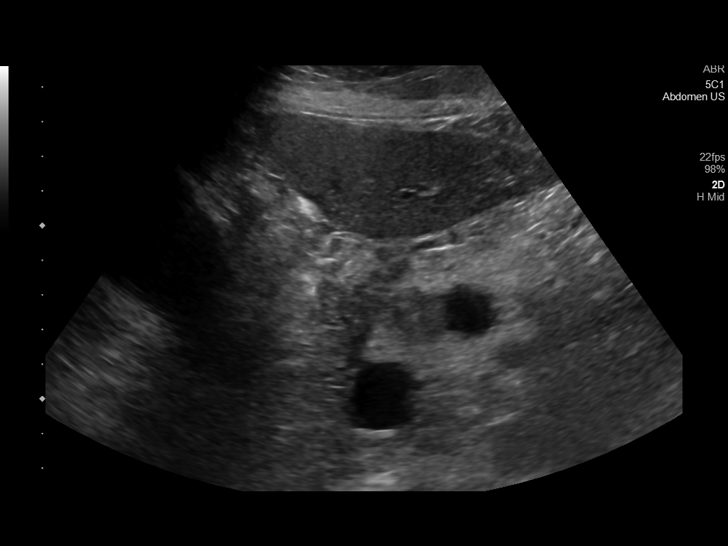
[im 22/33]
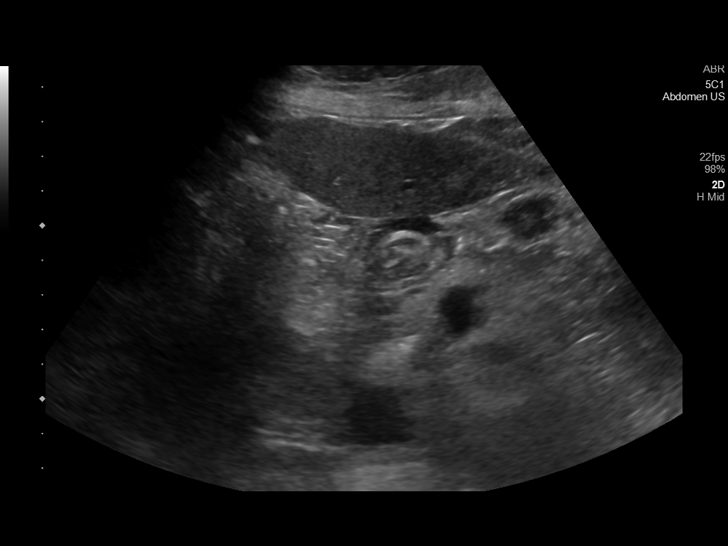
[im 25/33]
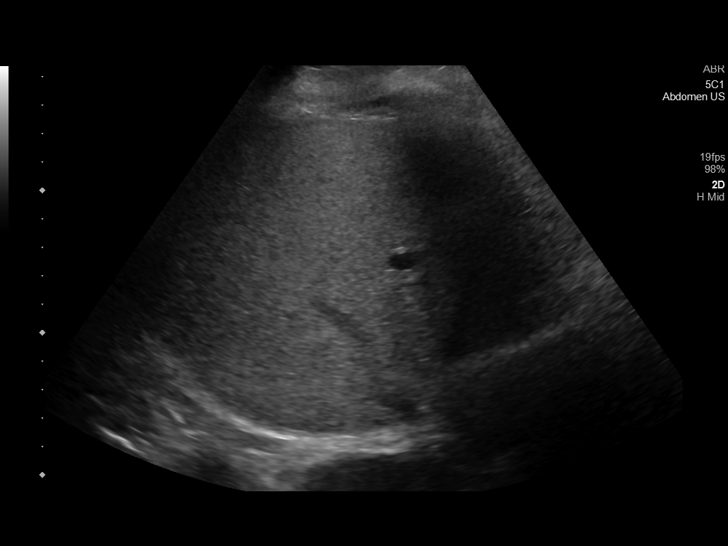
[im 27/33]
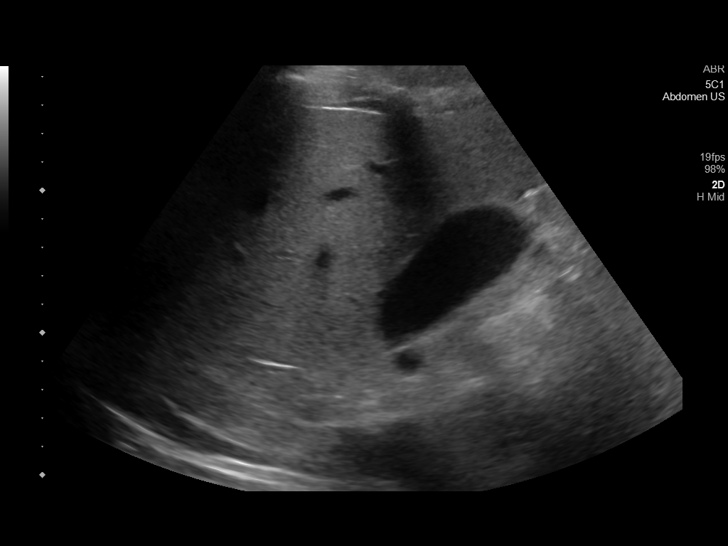
[im 30/33]
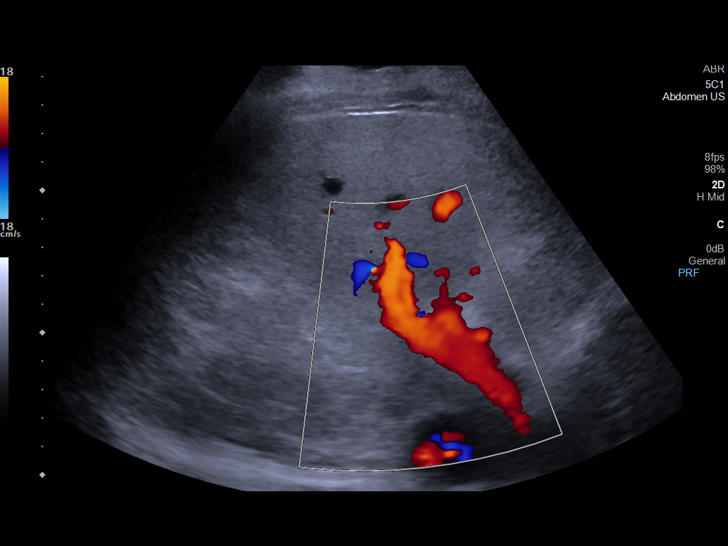
[im 33/33]
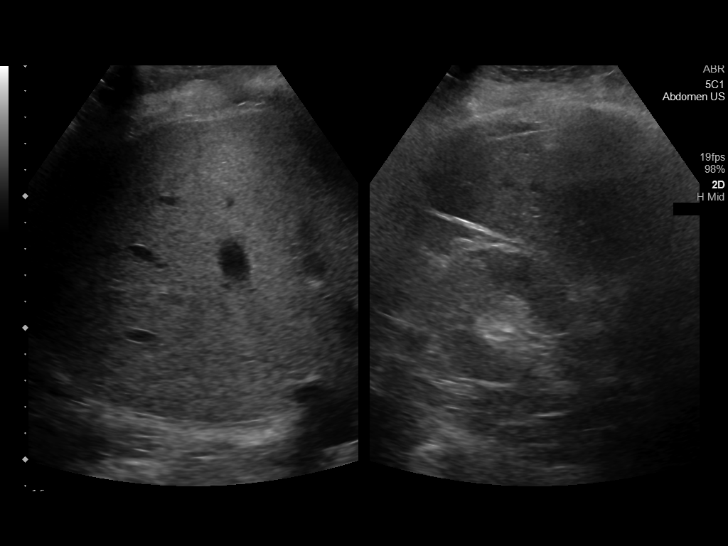

[14 of 25 positions shown; findings below may reference images not displayed]

FINDINGS: Gallbladder:

No gallstones or wall thickening visualized. No pericholecystic
fluid. There is mild sludge in the gallbladder. No sonographic
Murphy sign noted by sonographer.

Common bile duct:

Not appreciable due to body habitus and overlying gas. No biliary
duct dilatation is appreciable.

Liver:

No focal lesion identified. Within normal limits in parenchymal
echogenicity. Portal vein is patent on color Doppler imaging with
normal direction of blood flow towards the liver.

Other: None.
IMPRESSION: 1. Mild sludge in gallbladder. No gallstones, gallbladder wall
thickening, or pericholecystic fluid.

2. Common bile duct could not be seen with certainty. No biliary
ductal dilatation seen. If there is concern for biliary duct
pathology, MRCP would be the optimum study of choice for further
assessment.

3.  No appreciable liver lesions.

## 2020-12-18 IMAGING — CT CT HEAD W/O CM
3 of 6 series · 15 of 47 positions shown, 18 images · non-contrast
Comparison: Brain MRI [DATE], [DATE]. Head CTs [DATE]
and earlier.

CLINICAL DATA: 64-year-old female with altered mental status.
Pineal mass diagnosed recently on MRI.

EXAM:
CT HEAD WITHOUT CONTRAST
TECHNIQUE: Contiguous axial images were obtained from the base of the skull
through the vertex without intravenous contrast.

[Series 2: head wo · axial · 0.42mm/px · z∈[-257,-127]mm · 9 of 32 slices shown, 12 images]
[im 3/32  brain]
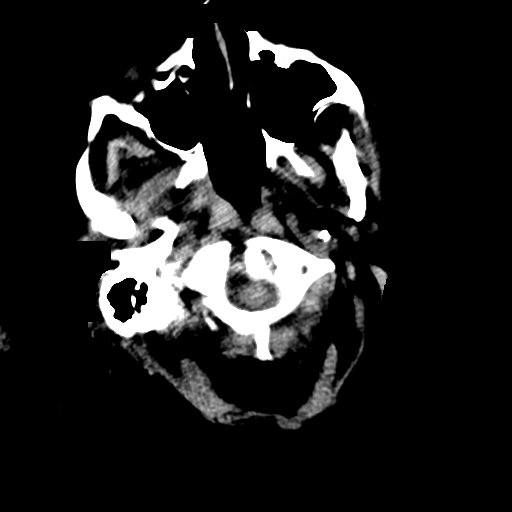
[im 3/32  bone]
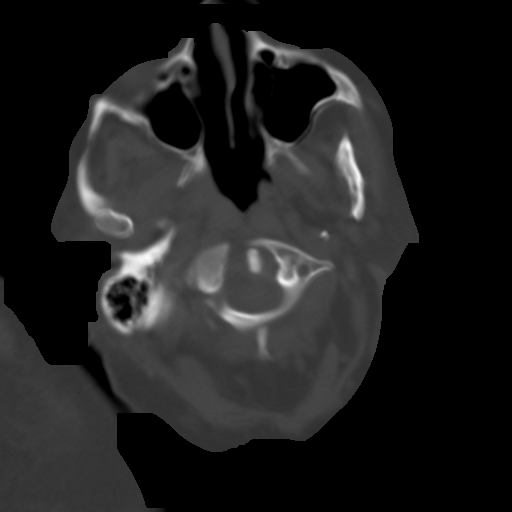
[im 7/32  brain]
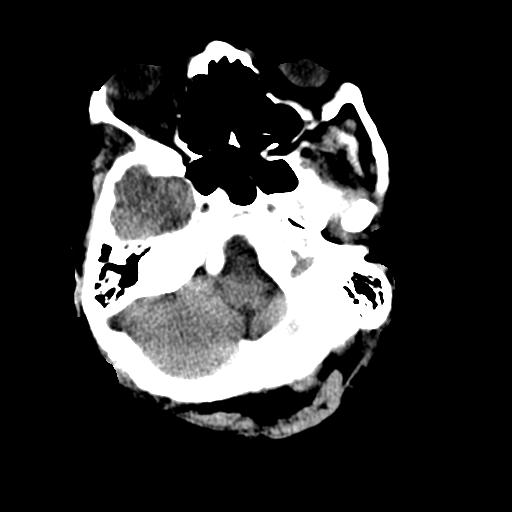
[im 9/32  brain]
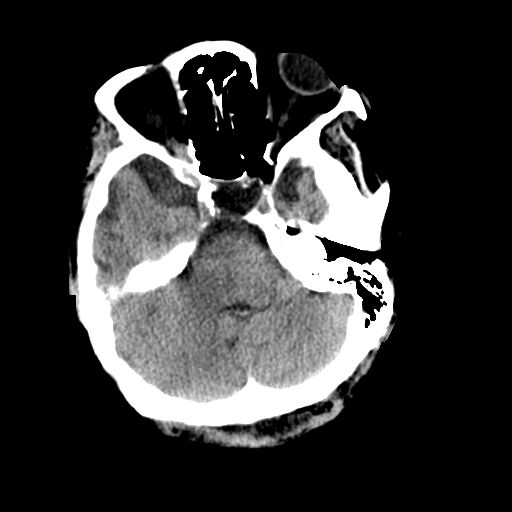
[im 14/32  brain]
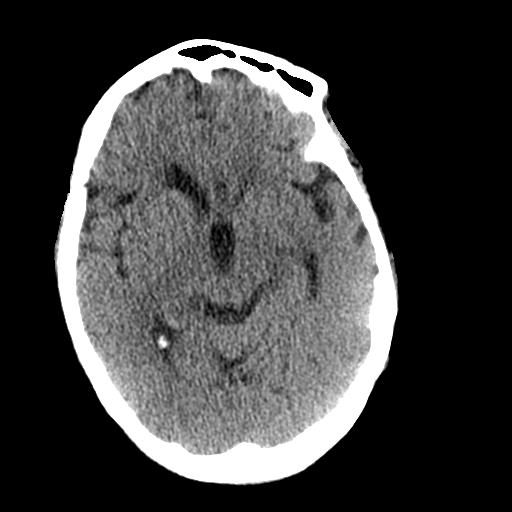
[im 16/32  brain]
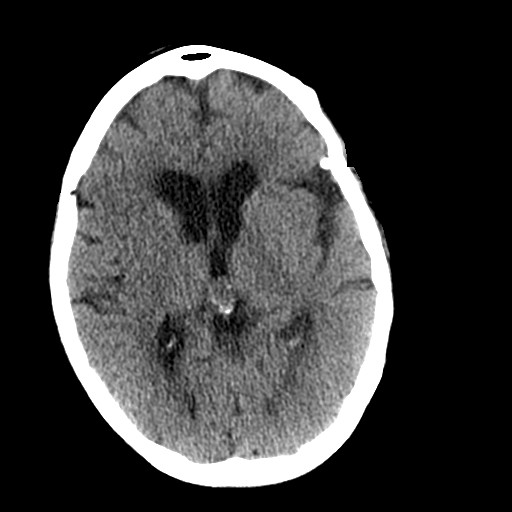
[im 16/32  bone]
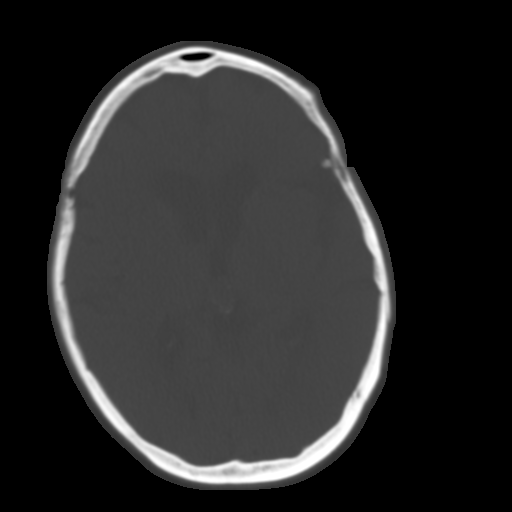
[im 18/32  brain]
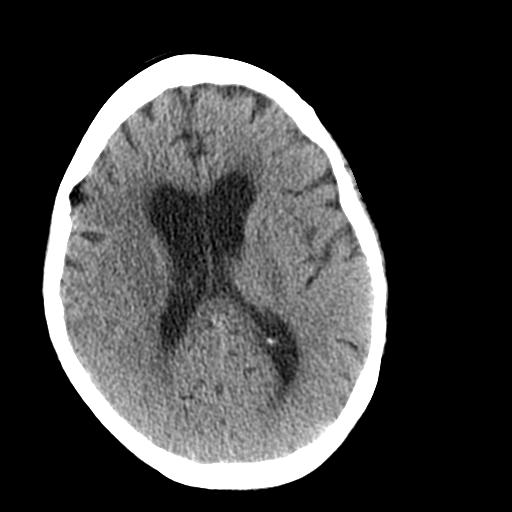
[im 23/32  brain]
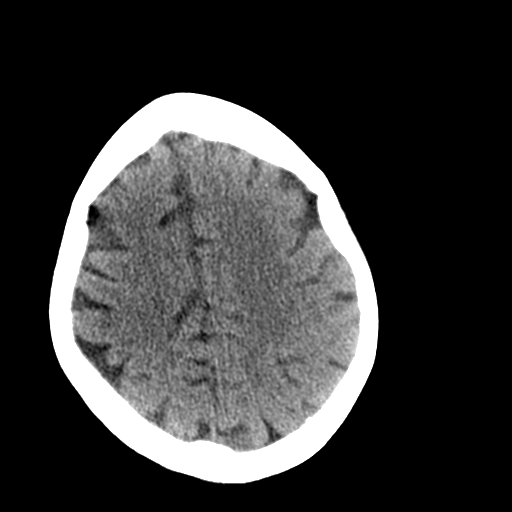
[im 25/32  brain]
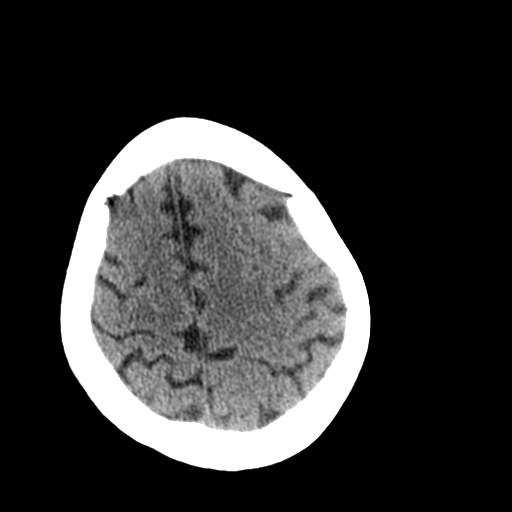
[im 29/32  brain]
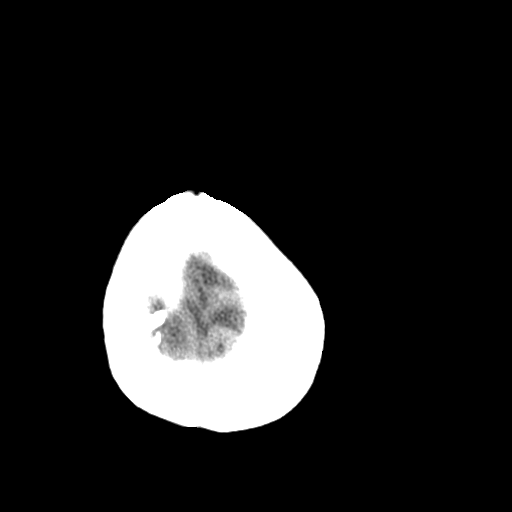
[im 29/32  bone]
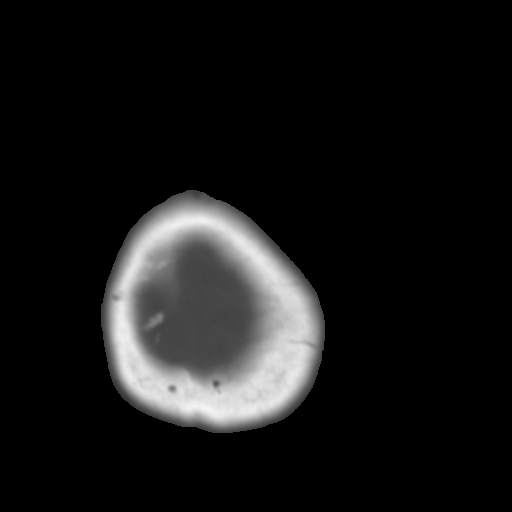

[Series 4: coronal soft tissue · coronal · 0.33mm/px · 3 of 72 slices shown]
[im 18/72  brain]
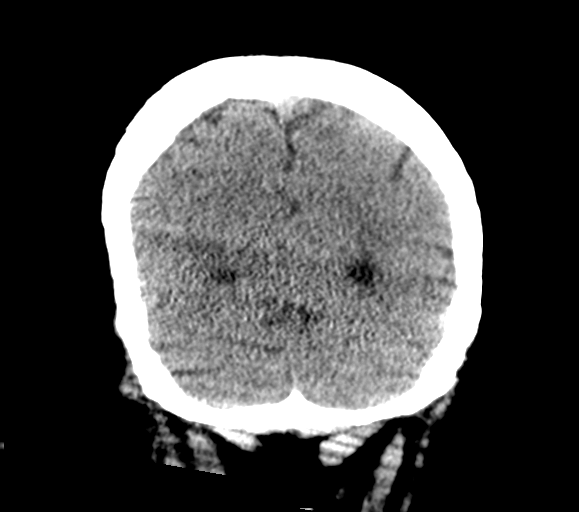
[im 36/72  brain]
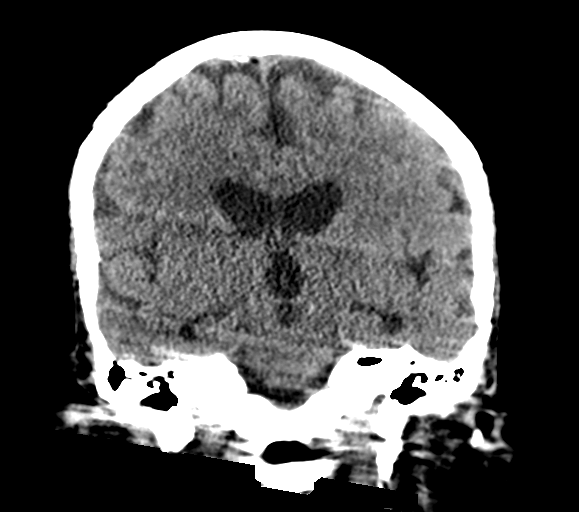
[im 54/72  brain]
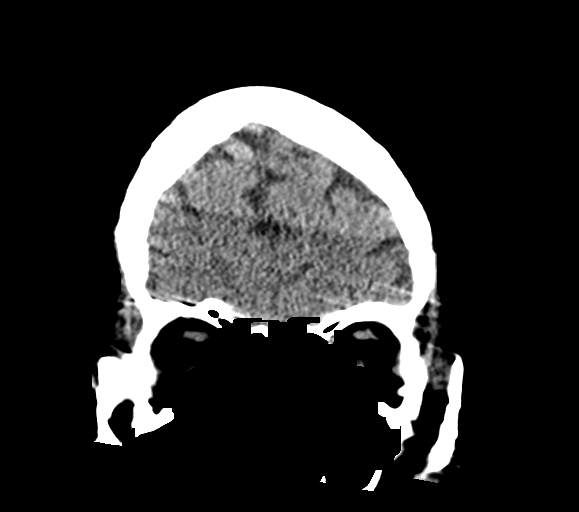

[Series 9: sagittal soft tissue · sagittal · 0.33mm/px · 3 of 60 slices shown]
[im 15/60  brain]
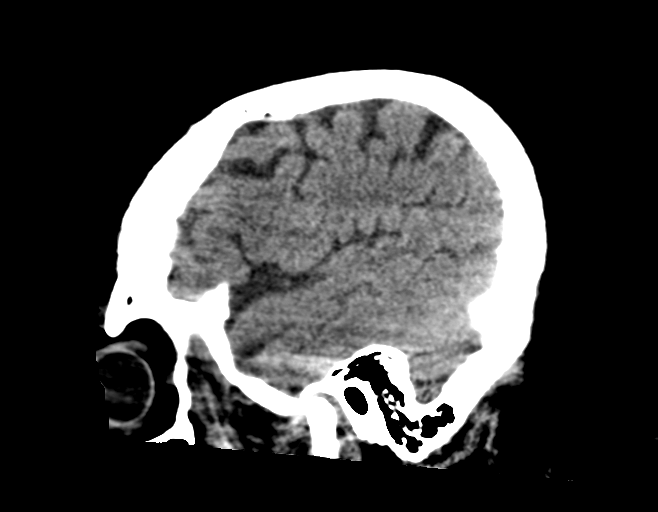
[im 26/60  brain]
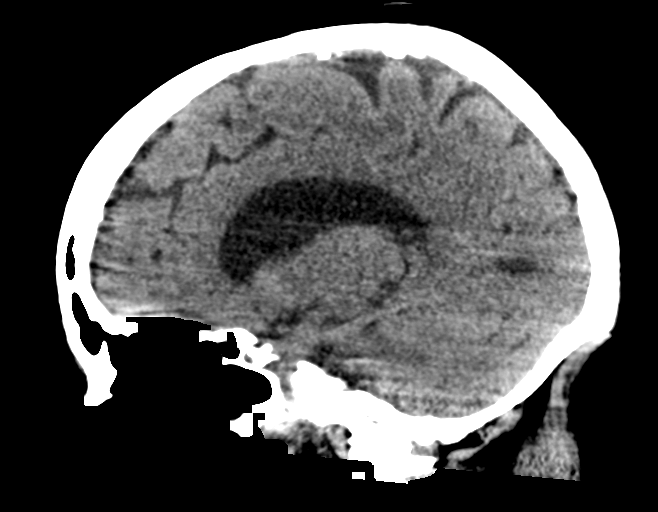
[im 38/60  brain]
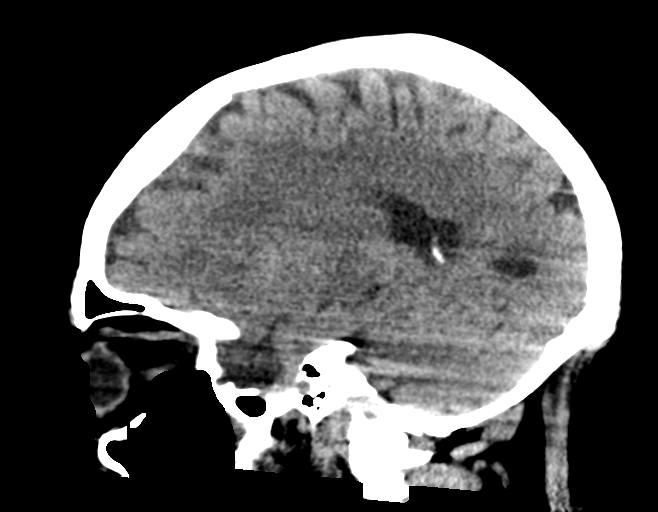

[15 of 47 positions shown; findings below may reference images not displayed]

FINDINGS: Brain: Chronic pineal region soft tissue mass as described on MRI,
with mild if any growth since CT [DATE].

Stable ventricle size and configuration without convincing
ventriculomegaly or transependymal edema. No midline shift. No acute
intracranial hemorrhage identified. No cortically based acute
infarct identified. Stable gray-white matter differentiation
throughout the brain.

Vascular: Calcified atherosclerosis at the skull base.

Skull: Stable.  No acute osseous abnormality identified.

Sinuses/Orbits: Visualized paranasal sinuses and mastoids are stable
and well pneumatized.

Other: Visualized orbits and scalp soft tissues are within normal
limits.
IMPRESSION: 1. No acute intracranial abnormality.
2. Noncontrast CT appearance of the brain not significantly changed
since [10]. Indolent pineal mass as described on recent MRIs.

## 2020-12-18 MED ORDER — SODIUM ZIRCONIUM CYCLOSILICATE 5 G PO PACK
10.0000 g | PACK | Freq: Three times a day (TID) | ORAL | Status: AC
  Administered 2020-12-18 – 2020-12-19 (×5): 10 g
  Filled 2020-12-18 (×2): qty 2
  Filled 2020-12-18: qty 1
  Filled 2020-12-18: qty 2
  Filled 2020-12-18: qty 1

## 2020-12-18 MED ORDER — ORAL CARE MOUTH RINSE
15.0000 mL | Freq: Two times a day (BID) | OROMUCOSAL | Status: DC
  Administered 2020-12-18 – 2020-12-22 (×9): 15 mL via OROMUCOSAL

## 2020-12-18 MED ORDER — PANTOPRAZOLE SODIUM 40 MG IV SOLR
40.0000 mg | Freq: Every day | INTRAVENOUS | Status: DC
  Administered 2020-12-18 – 2020-12-21 (×4): 40 mg via INTRAVENOUS
  Filled 2020-12-18 (×6): qty 40

## 2020-12-18 MED ORDER — ROCURONIUM BROMIDE 50 MG/5ML IV SOLN
INTRAVENOUS | Status: AC
  Filled 2020-12-18: qty 1

## 2020-12-18 MED ORDER — CALCIUM GLUCONATE-NACL 1-0.675 GM/50ML-% IV SOLN
1.0000 g | Freq: Once | INTRAVENOUS | Status: AC
  Administered 2020-12-18: 1000 mg via INTRAVENOUS
  Filled 2020-12-18: qty 50

## 2020-12-18 MED ORDER — DEXTROSE 50 % IV SOLN
1.0000 | Freq: Once | INTRAVENOUS | Status: AC
  Administered 2020-12-18: 50 mL via INTRAVENOUS
  Filled 2020-12-18: qty 50

## 2020-12-18 MED ORDER — DOCUSATE SODIUM 100 MG PO CAPS: 100.0000 mg | ORAL_CAPSULE | Freq: Two times a day (BID) | ORAL | Status: DC | PRN

## 2020-12-18 MED ORDER — ETOMIDATE 2 MG/ML IV SOLN
INTRAVENOUS | Status: AC
  Filled 2020-12-18: qty 10

## 2020-12-18 MED ORDER — DEXTROSE 50 % IV SOLN
50.0000 mL | Freq: Once | INTRAVENOUS | Status: AC
  Filled 2020-12-18: qty 50

## 2020-12-18 MED ORDER — MIDAZOLAM HCL 2 MG/2ML IJ SOLN
INTRAMUSCULAR | Status: AC
  Filled 2020-12-18: qty 4

## 2020-12-18 MED ORDER — SODIUM BICARBONATE 8.4 % IV SOLN
50.0000 meq | Freq: Once | INTRAVENOUS | Status: AC
  Administered 2020-12-18: 50 meq via INTRAVENOUS
  Filled 2020-12-18: qty 50

## 2020-12-18 MED ORDER — VANCOMYCIN HCL 1500 MG/300ML IV SOLN
1500.0000 mg | Freq: Once | INTRAVENOUS | Status: AC
  Administered 2020-12-18: 1500 mg via INTRAVENOUS
  Filled 2020-12-18: qty 300

## 2020-12-18 MED ORDER — DEXTROSE 50 % IV SOLN
INTRAVENOUS | Status: AC
  Administered 2020-12-18: 50 mL via INTRAVENOUS
  Filled 2020-12-18: qty 50

## 2020-12-18 MED ORDER — SODIUM CHLORIDE 0.9 % IV SOLN
2.0000 g | Freq: Two times a day (BID) | INTRAVENOUS | Status: DC
  Administered 2020-12-18 – 2020-12-20 (×5): 2 g via INTRAVENOUS
  Filled 2020-12-18 (×8): qty 2

## 2020-12-18 MED ORDER — INSULIN ASPART 100 UNIT/ML IV SOLN
10.0000 [IU] | Freq: Once | INTRAVENOUS | Status: AC
  Administered 2020-12-18: 10 [IU] via INTRAVENOUS
  Filled 2020-12-18: qty 0.1

## 2020-12-18 MED ORDER — HEPARIN SODIUM (PORCINE) 5000 UNIT/ML IJ SOLN
5000.0000 [IU] | Freq: Three times a day (TID) | INTRAMUSCULAR | Status: DC
  Administered 2020-12-18 – 2020-12-22 (×12): 5000 [IU] via SUBCUTANEOUS
  Filled 2020-12-18 (×12): qty 1

## 2020-12-18 MED ORDER — INSULIN ASPART 100 UNIT/ML ~~LOC~~ SOLN
10.0000 [IU] | Freq: Once | SUBCUTANEOUS | Status: AC
  Administered 2020-12-18: 10 [IU] via INTRAVENOUS
  Filled 2020-12-18: qty 1

## 2020-12-18 MED ORDER — CHLORHEXIDINE GLUCONATE 0.12 % MT SOLN
15.0000 mL | Freq: Two times a day (BID) | OROMUCOSAL | Status: DC
  Administered 2020-12-18 – 2020-12-21 (×8): 15 mL via OROMUCOSAL
  Filled 2020-12-18 (×7): qty 15

## 2020-12-18 MED ORDER — FENTANYL CITRATE (PF) 100 MCG/2ML IJ SOLN
INTRAMUSCULAR | Status: AC
  Filled 2020-12-18: qty 2

## 2020-12-18 MED ORDER — LACTATED RINGERS IV SOLN: INTRAVENOUS | Status: DC

## 2020-12-18 MED ORDER — AMIODARONE HCL 200 MG PO TABS
200.0000 mg | ORAL_TABLET | Freq: Every day | ORAL | Status: DC
  Administered 2020-12-18 – 2020-12-21 (×4): 200 mg
  Filled 2020-12-18 (×4): qty 1

## 2020-12-18 MED ORDER — MIDODRINE HCL 5 MG PO TABS: 10.0000 mg | ORAL_TABLET | Freq: Three times a day (TID) | ORAL | Status: DC

## 2020-12-18 MED ORDER — VANCOMYCIN HCL IN DEXTROSE 1-5 GM/200ML-% IV SOLN: 1000.0000 mg | Freq: Once | INTRAVENOUS | Status: DC

## 2020-12-18 MED ORDER — POLYETHYLENE GLYCOL 3350 17 G PO PACK: 17.0000 g | PACK | Freq: Every day | ORAL | Status: DC | PRN

## 2020-12-18 MED ORDER — CHLORHEXIDINE GLUCONATE CLOTH 2 % EX PADS
6.0000 | MEDICATED_PAD | Freq: Every day | CUTANEOUS | Status: DC
  Administered 2020-12-18 – 2020-12-22 (×5): 6 via TOPICAL
  Filled 2020-12-18: qty 6

## 2020-12-18 MED ORDER — LACTATED RINGERS IV BOLUS: 1000.0000 mL | Freq: Once | INTRAVENOUS | Status: DC

## 2020-12-18 NOTE — Progress Notes (Signed)
While patient was in the ED there was a concern that she would need to be intubated for airway protection because of altered mental status.  However patient was ventilating and the daughter wanted to explore every option prior to intubation. Therefore the decision was made to reevaluate upon arrival to ICU.  After transport the patient was significantly more arousable.  She would respond to her name focus and raise her eyebrows when asked questions.  Some head nodding to questions as well.  Vital signs remained stable, patient ventilating well on nasal cannula.  Decision made to hold off on intubation at this time, will repeat ABG.   Cheryll Cockayne Rust-Chester, AGACNP-BC Acute Care Nurse Practitioner [facility]   [phone] / [phone] Please see Amion for pager details.

## 2020-12-18 NOTE — ED Notes (Signed)
RT at bedside assessing possibility of BiPAP

## 2020-12-18 NOTE — Progress Notes (Signed)
RT obtained ABG on patient and no critical values on pt but patient is in an unresponsive state. Pt vitals signs on monitor dont reflect how patient looks at this time. Pt is to unresponsive for BIPAP but needs to be intubated if anything to not only protect her airway but BIPAP wont fix her metabolic state. PT is on a non rebreather and daughter at the bedside is undecided on what to do. RT will consult with other therapist and make a clinical decision.

## 2020-12-18 NOTE — ED Notes (Signed)
Chelsea Glass at bedside speaking with pt's daughter again at this time discussing all possible options.

## 2020-12-18 NOTE — Procedures (Signed)
Central Venous Catheter Insertion Procedure Note  Chelsea Glass  [id number]  08-08-1956  Date:12/18/20  Time:8:17 PM   Provider Performing:Britton L Rust-Chester   Procedure: Insertion of Non-tunneled Central Venous [phone]) with US guidance ([id number])   Indication(s) Medication administration and Difficult access  Consent Risks of the procedure as well as the alternatives and risks of each were explained to the patient and/or caregiver.  Consent for the procedure was obtained and is signed in the bedside chart  Anesthesia Topical only with 1% lidocaine   Timeout Verified patient identification, verified procedure, site/side was marked, verified correct patient position, special equipment/implants available, medications/allergies/relevant history reviewed, required imaging and test results available.  Sterile Technique Maximal sterile technique including full sterile barrier drape, hand hygiene, sterile gown, sterile gloves, mask, hair covering, sterile ultrasound probe cover (if used).  Procedure Description Area of catheter insertion was cleaned with chlorhexidine and draped in sterile fashion.  With real-time ultrasound guidance a central venous catheter was placed into the left internal jugular vein. Nonpulsatile blood flow and easy flushing noted in all ports.  The catheter was sutured in place and sterile dressing applied.  Complications/Tolerance None; patient tolerated the procedure well. Chest X-ray is ordered to verify placement for internal jugular or subclavian cannulation.   Chest x-ray is not ordered for femoral cannulation.  EBL Minimal  Specimen(s) None  CVC noted to be at the SVC level along the lateral mediastinal contour. Radiologist and Dr. Jayme Cloud recommended retracting the line 1 cm.  CVC retracted to 17 cm.   Cheryll Cockayne Rust-Chester, AGACNP-BC Acute Care Nurse Practitioner [facility]   [phone] /  [phone] Please see Amion for pager details.

## 2020-12-18 NOTE — ED Notes (Signed)
K+ 6.3 per lab, dr. Derrill Kay notified

## 2020-12-18 NOTE — Progress Notes (Signed)
Pt decision was made from daughter to NP to intubate her mother and so NP asked could we take PT to cat scan first. Pt was taken to cat scan and then brought up to unit for intubation. Pt got to the unit and after transfer to the bed patient began to arose a little. RT took non-rebreather off and applied 6L/Cross and saturation stayed around 97%. NP asked to hold off on intubation and do another ABG in an hr and see what happens after that. RT will draw ABG at 05:30

## 2020-12-18 NOTE — ED Notes (Signed)
Lab called at this time to draw repeat lactic and all morning labs. Per [place], [place], okay to draw all morning labs at this time to recheck potassium. Lab states they will work on sending a phlebotomist to assist.

## 2020-12-18 NOTE — ED Notes (Signed)
Pt brought to CT by this RN, RT walter, and CT tech [place]. Pt remains on all monitors. Pt to be brought straight to ICU following scan, Beth in ICU reports they are aware and will be ready for pt to be brought up.

## 2020-12-18 NOTE — ED Notes (Signed)
IV team unable to place IV with ultrasound

## 2020-12-18 NOTE — Progress Notes (Signed)
CODE SEPSIS - PHARMACY COMMUNICATION  **Broad Spectrum Antibiotics should be administered within 1 hour of Sepsis diagnosis**  Time Code Sepsis Called/Page Received: 0018  Antibiotics Ordered: Cefepime and Vancomycin  Time of 1st antibiotic administration: 0012  Otelia Sergeant, PharmD, [facility] 12/18/2020 12:34 AM

## 2020-12-18 NOTE — Progress Notes (Signed)
Pt being followed by [facility] for sepsis protocol. 

## 2020-12-18 NOTE — ED Notes (Signed)
IV team at bedside 

## 2020-12-18 NOTE — Progress Notes (Signed)
Pharmacy Antibiotic Note  Vernel Statzer is a 65 y.o. female admitted on 01-03-21 with HCAP.  Pharmacy has been consulted for Cefepime dosing. CXR:  "Diffuse patchy airspace opacities suggestive of infection/inflammation with associated bilateral trace to small view volume pleural effusions."  Plan: Ordered Cefepime 2 gm q12h per indication and renal fxn. Pharmacy will continue to follow SCr and will adjust dose if warranted.    Temp (24hrs), Avg:99.5 F (37.5 C), Min:99.5 F (37.5 C), Max:99.5 F (37.5 C)  Recent Labs  Lab 12/11/20 0425 12/12/20 0450 12/13/20 0353 12/14/20 0715 12/15/20 0539 12/16/20 0529 01/03/21 2316 12/18/20 0007  WBC 11.2*  --  10.9*  --   --  17.3* 14.0*  --   CREATININE 1.52* 1.61* 1.77* 1.80* 1.93*  --  2.28*  --   LATICACIDVEN  --   --   --   --   --   --  1.1 1.1    Estimated Creatinine Clearance: 32.1 mL/min (A) (by C-G formula based on SCr of 2.28 mg/dL (H)).    Allergies  Allergen Reactions  . Lisinopril Swelling    Antimicrobials this admission: 03/13 Vanc >> LD 2500 mg x 1 03/13 Cefepime >>   Microbiology results: 03/12 BCx: Pending 0./12 UCx: Pending   Thank you for allowing pharmacy to be a part of this patient's care.  Otelia Sergeant, PharmD, [facility] 12/18/2020 3:54 AM

## 2020-12-18 NOTE — H&P (Signed)
NAME:  Chelsea Glass, MRN:  [id number], DOB:  [birth year], LOS: 0 ADMISSION DATE:  12/25/2020, CONSULTATION DATE: 12/18/2020 REFERRING MD: Dr. Archie Balboa, CHIEF COMPLAINT: Altered mental status  Brief History:  65 year old female admitted to stepdown with altered mental status.  History of Present Illness:  65 year old female who presented to the emergency department from home due to her daughter's concerns of shortness of breath and altered mental status.  Patient was recently discharged on 7/40/8144 after a complicated hospital stay for AKI, altered mental status, failure to thrive and atrial fibrillation with rapid ventricular response.  Daughter confirms that the patient has been essentially nonverbal since before discharge from the hospital. ED course: Concerns for sepsis due to altered mental status, chest x-ray concerning for pneumonia and urinalysis concerning for urinary tract infection.  Lactic unimpressive at 1.1.  Patient received cefepime and vancomycin.  She was also found to be hyperkalemic at 6.3 and was given calcium gluconate, 10 units of insulin IV and D 50.  Concerns for respiratory status and overall protection of airway with ongoing lethargy.  ABG concerning for some respiratory acidosis 7.22/53/70/21.7.  Initial vitals: T 99.5, RR 20, HR 83, BP 130/82, SPO2 100% on a nonrebreather at 15 L. Significant labs: Hyponatremic at 130, hyperkalemic at 6.3, serum CO2 20, BUN 50, creatinine 2.28, alk phos 1228, AST 103, ALT 74, BNP 1719, lactic 1.1, WBC 14, hemoglobin 8. 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  Micro Data:  12/18/2020 COVID-19 >> negative 12/21/2020 influenza A/B >> negative 12/18/2020 urine culture >> 12/18/2020 blood cultures x 2 >> 12/18/2020 MRSA PCR >>  Antimicrobials:  12/18/2020 vancomycin x 1 01/03/2021 cefepime >>  Interim History / Subjective:  Upon arrival to ED patient lying in bed unresponsive opening eyes intermittently, no tracking or focusing noted.  Intermittent eye fluttering to the sound of her name, but not consistent.  Patient unable to follow commands, but currently protecting airway ventilating well all vital signs stable.  Discussed with daughter at length the concern for airway protection and the probable need to intubate and mechanically ventilate the patient.  Discussed concerns regarding this plan of action in terms of the patient's overall deconditioning and progressive decline. Daughter Chelsea Glass stated she would like to move forward with intubation if necessary but understands that this might not be the best option for the patient.  Labs/ Imaging personally reviewed Na+/ K+: 130/6.3 (treated) BUN/Cr.:  50/2.28 Serum CO2/ AG: 20/6 Alk phos: 1228 AST 103, ALT 74  Hgb: 8 BNP: 1719  WBC/ TMAX: 14/37.5 Lactic: 1.1  ABG: 7.22/53/70/21 CXR 01/01/2021: Diffuse patchy airspace opacities suggestive of infection with associated bilateral trace to small pleural effusions and likely superimposed pulmonary edema Objective   Blood pressure 119/75, pulse 84, temperature 99.5 F (37.5 C), temperature source Rectal, resp. rate 17, SpO2 100 %.       No intake or output data in the 24 hours ending 12/18/20 0041 There were no vitals filed for this visit.  Examination: General: Adult female, critically/chronically ill, lying in bed NAD HEENT: MM pink/moist, anicteric, atraumatic, neck supple Neuro: Minimally responsive, unable to follow commands, PERRL +3 CV: s1s2  RRR, NSR on monitor, no r/m/g Pulm: Regular, non  labored on NRB, breath sounds rhonchi-BUL, diminished-BLL GI: soft, rounded, non tender with PEG tube in place, bs x 4 Skin: Limited exam-scattered abrasions/ecchymosis noted Extremities: warm/dry, pulses + 2 R/P, +3 edema noted BLE  Resolved Hospital Problem list     Assessment & Plan:  Healthcare acquired pneumonia Urinary tract infection Chest x-ray suggestive of pneumonia, urinalysis positive for UTI with greater than 50 WBC's & large number of leukocytes.  Concern for sepsis, however patient currently hemodynamically stable with no signs of SIRS and lactic unimpressive at 1.1 -Trend lactic -Continue cefepime IV -Continuous cardiac monitoring -Daily CBC, monitor white blood cell and fever curve -Follow-up cultures, follow-up ABG -Consider BiPAP if appropriate  Acute Kidney Injury-recent admission with AKI creatinine 1.2-1.9 Creatinine in September 202: 0.96, Cr on admission: 2.28 - Strict I/O's: alert provider if UOP < 0.5 mL/kg/hr - gentle IVF hydration  - Daily BMP, replace electrolytes PRN - Avoid nephrotoxic agents as able, ensure adequate renal perfusion - Nephrology consulted, appreciate input   Transaminitis Alk phos-1228, AST 103, ALT 74 -RUQ ultrasound -Trend hepatic function  Acute encephalopathy Multifocal-in the setting of pneumonia, UTI, acidosis and overall deconditioning> daughter describes the patient becoming nonverbal during last admission which has remained unchanged.  Risk for intubation for airway protection. Head CT negative for acute intracranial abnormality -Treat infections as above, correct acidosis as possible, replace electrolytes -Supportive care -Avoid sedating meds as possible  Best practice (evaluated daily)  Diet: N.p.o. Pain/Anxiety/Delirium protocol (if indicated): N/A VAP protocol (if indicated): N/A DVT prophylaxis: Heparin SQ GI prophylaxis: Protonix IV Glucose control: Monitor every 4 Mobility: Bedrest Disposition:  Stepdown  Goals of Care:  Last date of multidisciplinary goals of care discussion: 12/18/2020 Family and staff present: APP, daughter, patient & care RN Summary of discussion: Discussed plan of care including risk for intubation, all questions and concerns answered at this time Follow up goals of care discussion due: 12/19/2020 Code Status: Full  Labs   CBC: Recent Labs  Lab 12/11/20 0425 12/13/20 0353 12/16/20 0529 12/16/2020 2316  WBC 11.2* 10.9* 17.3* 14.0*  NEUTROABS  --   --   --  PENDING  HGB 9.3* 8.1* 8.3* 8.0*  HCT 26.8* 23.5* 24.5* 24.3*  MCV 83.2 83.3 85.4 89.7  PLT 130* 124* 234 836    Basic Metabolic Panel: Recent Labs  Lab 12/11/20 0425 12/12/20 0450 12/13/20 0353 12/14/20 0715 12/15/20 0539 12/16/20 0529 12/14/2020 2316  NA 136 137 136 136 136  --  130*  K 3.9 4.2 4.3 5.8* 5.6* 5.4* 6.3*  CL 111 111 110 110 108  --  104  CO2 18* 19* 20* 18* 19*  --  20*  GLUCOSE 183* 101* 145* 122* 116*  --  177*  BUN 18 20 24* 29* 33*  --  50*  CREATININE 1.52* 1.61* 1.77* 1.80* 1.93*  --  2.28*  CALCIUM 8.4* 8.8* 9.1 9.2 8.9  --  9.4  MG 2.0 2.1  --   --  2.1 2.2  --   PHOS 2.8 2.3* 2.2* 2.8 2.8 3.7  --    GFR: Estimated Creatinine Clearance: 32.1 mL/min (A) (by C-G formula based on SCr of 2.28 mg/dL (H)). Recent Labs  Lab 12/11/20 0425 12/13/20 0353 12/16/20 0529 12/18/2020 2316 12/18/20 0007  WBC 11.2* 10.9* 17.3* 14.0*  --   LATICACIDVEN  --   --   --  1.1 1.1    Liver Function Tests: Recent Labs  Lab 12/11/20 0425 12/14/20 0715 12/16/2020 2316  AST  --   --  103*  ALT  --   --  74*  ALKPHOS  --   --  1,228*  BILITOT  --   --  1.0  PROT  --   --  5.7*  ALBUMIN 1.6* 3.5 3.2*   No results for input(s): LIPASE, AMYLASE in the last 168 hours. No results for input(s): AMMONIA in the last 168 hours.  ABG    Component Value Date/Time   PHART 7.22 (L) 12/19/2020 2245   PCO2ART 53 (H) 12/09/2020 2245   PO2ART 70 (L) 12/25/2020 2245   HCO3 21.7  12/23/2020 2245   ACIDBASEDEF 5.8 (H) 12/14/2020 2245   O2SAT 89.8 12/11/2020 2245     Coagulation Profile: Recent Labs  Lab 12/08/2020 2316  INR 1.1    Cardiac Enzymes: No results for input(s): CKTOTAL, CKMB, CKMBINDEX, TROPONINI in the last 168 hours.  HbA1C: Hgb A1c MFr Bld  Date/Time Value Ref Range Status  12/03/2020 01:01 PM 4.6 (L) 4.8 - 5.6 % Final    Comment:    (NOTE) Pre diabetes:          5.7%-6.4%  Diabetes:              >6.4%  Glycemic control for   <7.0% adults with diabetes   06/13/2020 01:21 PM 8.2 (H) 4.8 - 5.6 % Final    Comment:    (NOTE) Pre diabetes:          5.7%-6.4%  Diabetes:              >6.4%  Glycemic control for   <7.0% adults with diabetes     CBG: Recent Labs  Lab 12/15/20 2342 12/16/20 0444 12/16/20 0742 12/16/20 1138 12/16/20 1634  GLUCAP 150* 157* 155* 150* 158*    Review of Systems:   Unable to assess, patient is minimally responsive, and has been nonverbal since discharge  Past Medical History:  She,  has a past medical history of Arthritis, Diabetes mellitus without complication ([place]), and Hypertension.   Surgical History:   Past Surgical History:  Procedure Laterality Date  . IR FLUORO GUIDE CV LINE RIGHT  12/09/2020  . IR GASTROSTOMY TUBE MOD SED  12/09/2020  . TUBAL LIGATION    . UTERINE FIBROID SURGERY       Social History:   reports that she has never smoked. She has never used smokeless tobacco. She reports that she does not drink alcohol and does not use drugs.   Family History:  Her family history is not on file.   Allergies Allergies  Allergen Reactions  . Lisinopril Swelling     Home Medications  Prior to Admission medications   Medication Sig Start Date End Date Taking? Authorizing Provider  amiodarone (PACERONE) 200 MG tablet Take 1 tablet (200 mg total) by mouth daily. 12/07/2020   Fritzi Mandes, MD  ascorbic acid (VITAMIN C) 250 MG tablet Place 1 tablet (250 mg total) into feeding tube 2 (two)  times daily. 12/16/20   Fritzi Mandes, MD  diclofenac Sodium (VOLTAREN) 1 % GEL Apply 2 g topically 4 (four) times daily. 12/16/20   Fritzi Mandes, MD  DULoxetine (CYMBALTA) 30 MG capsule Take 30 mg by mouth daily. 11/18/20   [provider]  esomeprazole (NEXIUM) 20 MG capsule Take 1 capsule (20 mg total) by mouth daily at 12 noon. 12/16/20   Fritzi Mandes, MD  folic acid (FOLVITE) 1 MG tablet Place  1 tablet (1 mg total) into feeding tube daily. 12/27/2020   Fritzi Mandes, MD  lidocaine (LIDODERM) 5 % Place 1 patch onto the skin daily. Remove & Discard patch within 12 hours or as directed by MD 12/16/20   Fritzi Mandes, MD  liver oil-zinc oxide (DESITIN) 40 % ointment Apply topically 3 (three) times daily as needed for irritation. 12/16/20   Fritzi Mandes, MD  metoCLOPramide (REGLAN) 5 MG/5ML solution Take 5 mLs (5 mg total) by mouth 3 (three) times daily before meals. 12/16/20   Fritzi Mandes, MD  metoprolol succinate (TOPROL-XL) 25 MG 24 hr tablet Take 1 tablet (25 mg total) by mouth daily. 12/15/2020   Fritzi Mandes, MD  midodrine (PROAMATINE) 10 MG tablet Place 1 tablet (10 mg total) into feeding tube 3 (three) times daily with meals. 12/16/20   Fritzi Mandes, MD  mirtazapine (REMERON SOL-TAB) 45 MG disintegrating tablet Take 1 tablet (45 mg total) by mouth at bedtime. 12/16/20   Fritzi Mandes, MD  Nutritional Supplements (FEEDING SUPPLEMENT, OSMOLITE 1.5 CAL,) LIQD Place 1,000 mLs into feeding tube continuous. 12/16/20   Fritzi Mandes, MD  Nutritional Supplements (FEEDING SUPPLEMENT, PROSOURCE TF,) liquid Place 45 mLs into feeding tube 2 (two) times daily. 12/16/20   Fritzi Mandes, MD  OLANZapine (ZYPREXA) 5 MG tablet Place 1 tablet (5 mg total) into feeding tube at bedtime. 12/16/20   Fritzi Mandes, MD  polyethylene glycol (MIRALAX / GLYCOLAX) 17 g packet Place 17 g into feeding tube 2 (two) times daily. 12/16/20   Fritzi Mandes, MD  traMADol (ULTRAM) 50 MG tablet Take 1 tablet (50 mg total) by mouth every 12 (twelve) hours as  needed for moderate pain or severe pain. 12/16/20   Fritzi Mandes, MD  Water For Irrigation, Sterile (FREE WATER) SOLN Place 100 mLs into feeding tube 6 (six) times daily. 12/16/20   Fritzi Mandes, MD     Critical care time: 45 minutes       Venetia Night, AGACNP-BC Acute Care Nurse Practitioner [facility]   [phone] / [phone] Please see Amion for pager details.

## 2020-12-18 NOTE — ED Notes (Signed)
Room prepared and set up for intubation at request of Cheryll Cockayne. [name] at bedside, states she would like to talk to provider again about other options.

## 2020-12-18 NOTE — ED Notes (Signed)
[place] team remains at bedside attempting IV

## 2020-12-18 NOTE — ED Notes (Signed)
Per ED charge RN, working to get bed request placed and pt with be going to [place]. Bedside report to be given when arriving to unit.

## 2020-12-19 DIAGNOSIS — Z7189 Other specified counseling: Secondary | ICD-10-CM | POA: Diagnosis not present

## 2020-12-19 DIAGNOSIS — G0401 Postinfectious acute disseminated encephalitis and encephalomyelitis (postinfectious ADEM): Secondary | ICD-10-CM

## 2020-12-19 LAB — CBC
HCT: 23.1 % — ABNORMAL LOW (ref 36.0–46.0)
Hemoglobin: 7.2 g/dL — ABNORMAL LOW (ref 12.0–15.0)
MCH: 28.7 pg (ref 26.0–34.0)
MCHC: 31.2 g/dL (ref 30.0–36.0)
MCV: 92 fL (ref 80.0–100.0)
Platelets: 276 10*3/uL (ref 150–400)
RBC: 2.51 MIL/uL — ABNORMAL LOW (ref 3.87–5.11)
RDW: 22.9 % — ABNORMAL HIGH (ref 11.5–15.5)
WBC: 12.8 10*3/uL — ABNORMAL HIGH (ref 4.0–10.5)
nRBC: 1.9 % — ABNORMAL HIGH (ref 0.0–0.2)

## 2020-12-19 LAB — COMPREHENSIVE METABOLIC PANEL
ALT: 55 U/L — ABNORMAL HIGH (ref 0–44)
AST: 40 U/L (ref 15–41)
Albumin: 3.2 g/dL — ABNORMAL LOW (ref 3.5–5.0)
Alkaline Phosphatase: 964 U/L — ABNORMAL HIGH (ref 38–126)
Anion gap: 6 (ref 5–15)
BUN: 56 mg/dL — ABNORMAL HIGH (ref 8–23)
CO2: 23 mmol/L (ref 22–32)
Calcium: 9.6 mg/dL (ref 8.9–10.3)
Chloride: 104 mmol/L (ref 98–111)
Creatinine, Ser: 2.4 mg/dL — ABNORMAL HIGH (ref 0.44–1.00)
GFR, Estimated: 22 mL/min — ABNORMAL LOW (ref >60)
Glucose, Bld: 100 mg/dL — ABNORMAL HIGH (ref 70–99)
Potassium: 5.4 mmol/L — ABNORMAL HIGH (ref 3.5–5.1)
Sodium: 133 mmol/L — ABNORMAL LOW (ref 135–145)
Total Bilirubin: 1.1 mg/dL (ref 0.3–1.2)
Total Protein: 5.9 g/dL — ABNORMAL LOW (ref 6.5–8.1)

## 2020-12-19 LAB — GLUCOSE, CAPILLARY
Glucose-Capillary: 123 mg/dL — ABNORMAL HIGH (ref 70–99)
Glucose-Capillary: 89 mg/dL (ref 70–99)
Glucose-Capillary: 87 mg/dL (ref 70–99)
Glucose-Capillary: 106 mg/dL — ABNORMAL HIGH (ref 70–99)
Glucose-Capillary: 98 mg/dL (ref 70–99)
Glucose-Capillary: 94 mg/dL (ref 70–99)

## 2020-12-19 LAB — POTASSIUM: Potassium: 5.1 mmol/L (ref 3.5–5.1)

## 2020-12-19 LAB — MAGNESIUM: Magnesium: 2.3 mg/dL (ref 1.7–2.4)

## 2020-12-19 LAB — PHOSPHORUS: Phosphorus: 4.6 mg/dL (ref 2.5–4.6)

## 2020-12-19 MED ORDER — SODIUM ZIRCONIUM CYCLOSILICATE 10 G PO PACK
10.0000 g | PACK | Freq: Every day | ORAL | Status: DC
  Administered 2020-12-20: 10 g
  Filled 2020-12-19: qty 1

## 2020-12-19 MED ORDER — MIRTAZAPINE 15 MG PO TABS
45.0000 mg | ORAL_TABLET | Freq: Every day | ORAL | Status: DC
  Administered 2020-12-19 – 2020-12-21 (×3): 45 mg via ORAL
  Filled 2020-12-19 (×3): qty 3

## 2020-12-19 MED ORDER — SODIUM ZIRCONIUM CYCLOSILICATE 5 G PO PACK: 10.0000 g | PACK | Freq: Every day | ORAL | Status: DC

## 2020-12-19 MED ORDER — ASCORBIC ACID 500 MG PO TABS
250.0000 mg | ORAL_TABLET | Freq: Two times a day (BID) | ORAL | Status: DC
  Administered 2020-12-19 – 2020-12-21 (×5): 250 mg
  Filled 2020-12-19 (×5): qty 1

## 2020-12-19 MED ORDER — FREE WATER
120.0000 mL | Status: DC
  Administered 2020-12-19 – 2020-12-22 (×14): 120 mL

## 2020-12-19 MED ORDER — METOCLOPRAMIDE HCL 10 MG/10ML PO SOLN
5.0000 mg | Freq: Three times a day (TID) | ORAL | Status: DC
  Administered 2020-12-20 – 2020-12-21 (×4): 5 mg
  Filled 2020-12-19: qty 10
  Filled 2020-12-19: qty 5
  Filled 2020-12-19: qty 10
  Filled 2020-12-19: qty 5
  Filled 2020-12-19: qty 10
  Filled 2020-12-19 (×5): qty 5
  Filled 2020-12-19: qty 10
  Filled 2020-12-19: qty 5
  Filled 2020-12-19 (×3): qty 10

## 2020-12-19 MED ORDER — NEPRO/CARBSTEADY PO LIQD
1000.0000 mL | ORAL | Status: DC
  Administered 2020-12-19 – 2020-12-21 (×3): 1000 mL via ORAL

## 2020-12-19 MED ORDER — OSMOLITE 1.2 CAL PO LIQD
1000.0000 mL | ORAL | Status: DC
  Administered 2020-12-19 (×2): 1000 mL

## 2020-12-19 MED ORDER — DULOXETINE HCL 30 MG PO CPEP
30.0000 mg | ORAL_CAPSULE | Freq: Every day | ORAL | Status: DC
  Administered 2020-12-20 – 2020-12-21 (×2): 30 mg via ORAL
  Filled 2020-12-19 (×2): qty 1

## 2020-12-19 MED ORDER — OLANZAPINE 5 MG PO TABS
5.0000 mg | ORAL_TABLET | Freq: Every day | ORAL | Status: DC
  Administered 2020-12-19 – 2020-12-20 (×2): 5 mg via ORAL
  Filled 2020-12-19 (×2): qty 1

## 2020-12-19 MED ORDER — PROSOURCE TF PO LIQD
45.0000 mL | Freq: Two times a day (BID) | ORAL | Status: DC
  Administered 2020-12-20 – 2020-12-21 (×4): 45 mL
  Filled 2020-12-19 (×5): qty 45

## 2020-12-19 NOTE — Progress Notes (Addendum)
NAME:  Chelsea Glass, MRN:  [id number], DOB:  [birth year], LOS: 1 ADMISSION DATE:  01/02/2021,    Brief History:  65 year old female admitted to stepdown with altered mental status.  History of Present Illness:  65 year old female who presented to the emergency department from home due to her daughter's concerns of shortness of breath and altered mental status.  Patient was recently discharged on 2/87/8676 after a complicated hospital stay for AKI, altered mental status, failure to thrive and atrial fibrillation with rapid ventricular response.  Daughter confirms that the patient has been essentially nonverbal since before discharge from the hospital. ED course: Concerns for sepsis due to altered mental status, chest x-ray concerning for pneumonia and urinalysis concerning for urinary tract infection.  Lactic unimpressive at 1.1.  Patient received cefepime and vancomycin.  She was also found to be hyperkalemic at 6.3 and was given calcium gluconate, 10 units of insulin IV and D 50.  Concerns for respiratory status and overall protection of airway with ongoing lethargy.  ABG concerning for some respiratory acidosis 7.22/53/70/21.7.  Initial vitals: T 99.5, RR 20, HR 83, BP 130/82, SPO2 100% on a nonrebreather at 15 L. Significant labs: Hyponatremic at 130, hyperkalemic at 6.3, serum CO2 20, BUN 50, creatinine 2.28, alk phos 1228, AST 103, ALT 74, BNP 1719, lactic 1.1, WBC 14, hemoglobin 8. 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  Micro Data:  01/05/2021 COVID-19 >>  negative 12/29/2020 influenza A/B >> negative 12/18/2020 urine culture >> 12/18/2020 blood cultures x 2 >> 12/18/2020 MRSA PCR >>  Antimicrobials:  12/18/2020 vancomycin x 1 12/20/2020 cefepime >>   Interim History / Subjective:  Patient with significant neurological deficits from [place] 19 Was admitted to [facility] service for possible intubation Patient on minimal oxygen Failure to thrive since COVID lost 100 pounds S/p PEG  patient's overall deconditioning and progressive decline.      Objective   Blood pressure 124/67, pulse 87, temperature (!) 97.5 F (36.4 C), temperature source Axillary, resp. rate 18, weight 128 kg, SpO2 100 %.        Intake/Output Summary (Last 24 hours) at 12/19/2020 1357 Last data filed at 12/19/2020 1100 Gross per 24 hour  Intake 317 ml  Output 650 ml  Net -333 ml   Filed Weights   12/18/20 0500 12/19/20 0500  Weight: 124.3 kg 128 kg    REVIEW OF SYSTEMS  PATIENT IS UNABLE TO PROVIDE COMPLETE REVIEW OF SYSTEM S DUE TO SEVERE ENCEPHALOPATHY   PHYSICAL EXAMINATION:  GENERAL: ill appearing,  HEAD: Normocephalic, atraumatic.  EYES: Pupils equal, round, reactive to light.  No scleral icterus.  MOUTH: Moist mucosal membrane. NECK: Supple. No thyromegaly. No nodules. No JVD.  PULMONARY: +rhonchi, CARDIOVASCULAR: S1 and S2. Regular rate and rhythm. No murmurs, rubs, or gallops.  GASTROINTESTINAL: Soft, nontender, -distended. Positive bowel sounds.  MUSCULOSKELETAL: No swelling, clubbing, or edema.  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 with failure to thrive and very poor chance of meaningful recovery Patient with severe post infectious encephalitis  NEUROLOGY Acute toxic metabolic encephalopathy most likley from COVID 19 encephalitis Oxygen as needed daughter describes the patient becoming nonverbal during last admission which has remained unchanged  RESP Healthcare acquired pneumonia Urinary tract infection -continue antibiotics as prescribed -follow up cultures   ACUTE KIDNEY INJURY/Renal Failure -continue Foley Catheter-assess need -Avoid nephrotoxic agents -Follow urine output, BMP -Ensure adequate renal perfusion, optimize oxygenation -Renal dose medications    GI GI PROPHYLAXIS as indicated  NUTRITIONAL STATUS DIET-->TF'sPER PEG TUBE Constipation protocol as indicated  ENDO - ICU hypoglycemic\Hyperglycemia protocol -check FSBS per protocol  ACUTE ANEMIA- TRANSFUSE AS NEEDED DVT PRX with TED/SCD's ONLY TRANSFUSE if HGB<7     DVT/GI PRX ordered and assessed TRANSFUSIONS AS NEEDED MONITOR FSBS I Assessed the need for Labs I Assessed the need for Foley I Assessed the need for Central Venous Line Family Discussion when available I Assessed the need for Mobilization I made an Assessment of medications to be adjusted accordingly Safety Risk assessment completed  CASE DISCUSSED IN MULTIDISCIPLINARY ROUNDS WITH ICU TEAM   Labs   CBC: Recent Labs  Lab 12/13/20 0353 12/16/20 0529 12/07/2020 2316 12/18/20 0007 12/19/20 0326  WBC 10.9* 17.3* 14.0* 13.8* 12.8*  NEUTROABS  --   --  7.7  --   --   HGB 8.1* 8.3* 8.0* 7.6* 7.2*  HCT 23.5* 24.5* 24.3* 24.0* 23.1*  MCV 83.3 85.4 89.7 91.6 92.0  PLT 124* 234 284 277 224    Basic Metabolic Panel: Recent Labs  Lab 12/14/20 0715 12/15/20 0539 12/16/20 0529 12/12/2020 2316 12/18/20 0007 12/18/20 1536 12/19/20 0326  NA 136 136  --  130* 130* 132* 133*  K 5.8* 5.6* 5.4*  6.3* 6.2* 5.7* 5.4*  CL 110 108  --  104 105 104 104  CO2 18* 19*  --  20* 20* 22 23  GLUCOSE 122* 116*  --  177* 166* 125* 100*  BUN 29* 33*  --  50* 50* 55* 56*  CREATININE 1.80* 1.93*  --  2.28* 2.35* 2.42* 2.40*  CALCIUM 9.2 8.9  --  9.4 9.5 9.6 9.6  MG  --  2.1 2.2  --  2.2  --  2.3  PHOS 2.8 2.8 3.7  --  4.2  --  4.6   GFR: Estimated Creatinine Clearance: 31.1 mL/min (A) (by C-G formula based on SCr of 2.4 mg/dL (H)). Recent Labs  Lab 12/16/20 0529 12/13/2020 2316 12/18/20 0007 12/18/20 0527 12/19/20 0326  WBC 17.3* 14.0* 13.8*  --  12.8*  LATICACIDVEN  --  1.1 1.1 1.3  --     Liver Function Tests: Recent Labs  Lab 12/14/20 0715 12/18/2020 2316 12/18/20 0007 12/19/20 0326  AST  --  103* 99* 40  ALT  --  74* 75* 55*  ALKPHOS  --  1,228* 1,272* 964*  BILITOT  --  1.0 1.0 1.1  PROT  --  5.7* 5.8* 5.9*  ALBUMIN 3.5 3.2* 3.2* 3.2*   No results for input(s): LIPASE, AMYLASE in the last 168 hours. No results for input(s): AMMONIA in the last 168 hours.  ABG    Component Value Date/Time   PHART 7.15 (LL) 12/18/2020 0537   PCO2ART 59 (H) 12/18/2020 4975  PO2ART 137 (H) 12/18/2020 0537   HCO3 20.6 12/18/2020 0537   ACIDBASEDEF 7.9 (H) 12/18/2020 0537   O2SAT 98.3 12/18/2020 0537     Coagulation Profile: Recent Labs  Lab 12/29/2020 2316  INR 1.1    Cardiac Enzymes: No results for input(s): CKTOTAL, CKMB, CKMBINDEX, TROPONINI in the last 168 hours.  HbA1C: Hgb A1c MFr Bld  Date/Time Value Ref Range Status  12/03/2020 01:01 PM 4.6 (L) 4.8 - 5.6 % Final    Comment:    (NOTE) Pre diabetes:          5.7%-6.4%  Diabetes:              >6.4%  Glycemic control for   <7.0% adults with diabetes   06/13/2020 01:21 PM 8.2 (H) 4.8 - 5.6 % Final    Comment:    (NOTE) Pre diabetes:          5.7%-6.4%  Diabetes:              >6.4%  Glycemic control for   <7.0% adults with diabetes     CBG: Recent Labs  Lab 12/18/20 1929 12/18/20 2315 12/19/20 0316  12/19/20 0742 12/19/20 1247  GLUCAP 101* 106* 89 87 106*    Allergies Allergies  Allergen Reactions  . Lisinopril Swelling     Home Medications  Prior to Admission medications   Medication Sig Start Date End Date Taking? Authorizing Provider  amiodarone (PACERONE) 200 MG tablet Take 1 tablet (200 mg total) by mouth daily. 12/25/2020  Yes Fritzi Mandes, MD  ascorbic acid (VITAMIN C) 250 MG tablet Place 1 tablet (250 mg total) into feeding tube 2 (two) times daily. 12/16/20  Yes Fritzi Mandes, MD  diclofenac Sodium (VOLTAREN) 1 % GEL Apply 2 g topically 4 (four) times daily. 12/16/20  Yes Fritzi Mandes, MD  DULoxetine (CYMBALTA) 30 MG capsule Take 30 mg by mouth daily. 11/18/20  Yes [provider]  esomeprazole (NEXIUM) 20 MG capsule Take 1 capsule (20 mg total) by mouth daily at 12 noon. 12/16/20  Yes Fritzi Mandes, MD  folic acid (FOLVITE) 1 MG tablet Place 1 tablet (1 mg total) into feeding tube daily. 12/24/2020  Yes Fritzi Mandes, MD  lidocaine (LIDODERM) 5 % Place 1 patch onto the skin daily. Remove & Discard patch within 12 hours or as directed by MD 12/16/20  Yes Fritzi Mandes, MD  metoCLOPramide (REGLAN) 5 MG/5ML solution Take 5 mLs (5 mg total) by mouth 3 (three) times daily before meals. 12/16/20  Yes Fritzi Mandes, MD  metoprolol succinate (TOPROL-XL) 25 MG 24 hr tablet Take 1 tablet (25 mg total) by mouth daily. 12/16/2020  Yes Fritzi Mandes, MD  mirtazapine (REMERON SOL-TAB) 45 MG disintegrating tablet Take 1 tablet (45 mg total) by mouth at bedtime. 12/16/20  Yes Fritzi Mandes, MD  OLANZapine (ZYPREXA) 5 MG tablet Place 1 tablet (5 mg total) into feeding tube at bedtime. 12/16/20  Yes Fritzi Mandes, MD  liver oil-zinc oxide (DESITIN) 40 % ointment Apply topically 3 (three) times daily as needed for irritation. 12/16/20   Fritzi Mandes, MD  midodrine (PROAMATINE) 10 MG tablet Place 1 tablet (10 mg total) into feeding tube 3 (three) times daily with meals. 12/16/20   Fritzi Mandes, MD  Nutritional Supplements  (FEEDING SUPPLEMENT, OSMOLITE 1.5 CAL,) LIQD Place 1,000 mLs into feeding tube continuous. 12/16/20   Fritzi Mandes, MD  Nutritional Supplements (FEEDING SUPPLEMENT, PROSOURCE TF,) liquid Place 45 mLs into feeding tube 2 (two) times daily. 12/16/20  Fritzi Mandes, MD  polyethylene glycol (MIRALAX / GLYCOLAX) 17 g packet Place 17 g into feeding tube 2 (two) times daily. 12/16/20   Fritzi Mandes, MD  traMADol (ULTRAM) 50 MG tablet Take 1 tablet (50 mg total) by mouth every 12 (twelve) hours as needed for moderate pain or severe pain. 12/16/20   Fritzi Mandes, MD  Water For Irrigation, Sterile (FREE WATER) SOLN Place 100 mLs into feeding tube 6 (six) times daily. 12/16/20   Fritzi Mandes, MD     CAN TRANSFER TO [facility] CAN TRANSFER TO GEN MED FLOOR   Corrin Parker, M.D.  [facility]  Medical Director [place] Director [facility] Cardio-Pulmonary Department

## 2020-12-19 NOTE — Progress Notes (Signed)
[facility], [place] 12/19/20  Subjective:   LOS: 1 03/13 0701 - 03/14 0700 In: 317 [NG/GT:17; IV Piggyback:200] Out: 825 [Urine:825]  Patient known to our practice from previous admission and she was followed for AKI.  This time she presents to the emergency room from home due to altered mental status.  She was recently discharged from the hospital on March 11 after complicated stay for a creatinine, altered mental status, failure to thrive and atrial fibrillation with rapid ventricular response At present patient is fairly lethargic and not able to provide meaningful information. In the ER they were concerned about pneumonia and UTI based on findings of chest x-ray and urinalysis.  She was admitted for further evaluation and management. She was also noted to be hyperkalemic.  Potassium was treated with shifting measures     Objective:  Vital signs in last 24 hours:  Temp:  [97.4 F (36.3 C)-97.8 F (36.6 C)] 97.5 F (36.4 C) (03/14 0900) Pulse Rate:  [78-92] 87 (03/14 1100) Resp:  [11-22] 18 (03/14 1100) BP: (115-131)/(67-89) 124/67 (03/14 1100) SpO2:  [96 %-100 %] 100 % (03/14 1100) Weight:  [601 kg] 128 kg (03/14 0500)  Weight change: 3.7 kg Filed Weights   12/18/20 0500 12/19/20 0500  Weight: 124.3 kg 128 kg    Intake/Output:    Intake/Output Summary (Last 24 hours) at 12/19/2020 1357 Last data filed at 12/19/2020 1100 Gross per 24 hour  Intake 317 ml  Output 650 ml  Net -333 ml     Physical Exam: General:  Lethargic, not able to provide any information  HEENT  mouth is dry  Pulm/lungs  [place] O2, normal breathing effort, mild crackles at bases  CVS/Heart  regular, tachycardic  Abdomen:   Soft, mildly distended  Extremities:  2+ dependent edema  Neurologic:  Lethargic, moaning  Skin:  Warm, dry    Foley in place with clear yellow urine       Basic Metabolic Panel:  Recent Labs  Lab 12/14/20 0715 12/15/20 0539 12/16/20 0529  Jan 09, 2021 2316 12/18/20 0007 12/18/20 1536 12/19/20 0326  NA 136 136  --  130* 130* 132* 133*  K 5.8* 5.6* 5.4* 6.3* 6.2* 5.7* 5.4*  CL 110 108  --  104 105 104 104  CO2 18* 19*  --  20* 20* 22 23  GLUCOSE 122* 116*  --  177* 166* 125* 100*  BUN 29* 33*  --  50* 50* 55* 56*  CREATININE 1.80* 1.93*  --  2.28* 2.35* 2.42* 2.40*  CALCIUM 9.2 8.9  --  9.4 9.5 9.6 9.6  MG  --  2.1 2.2  --  2.2  --  2.3  PHOS 2.8 2.8 3.7  --  4.2  --  4.6     CBC: Recent Labs  Lab 12/13/20 0353 12/16/20 0529 01/09/2021 2316 12/18/20 0007 12/19/20 0326  WBC 10.9* 17.3* 14.0* 13.8* 12.8*  NEUTROABS  --   --  7.7  --   --   HGB 8.1* 8.3* 8.0* 7.6* 7.2*  HCT 23.5* 24.5* 24.3* 24.0* 23.1*  MCV 83.3 85.4 89.7 91.6 92.0  PLT 124* 234 284 277 276      Lab Results  Component Value Date   HEPBSAG NON REACTIVE 06/13/2020   HEPBIGM NON REACTIVE 06/13/2020      Microbiology:  Recent Results (from the past 240 hour(s))  Urine culture     Status: Abnormal (Preliminary result)   Collection Time: 01/09/2021 11:07 PM  Specimen: Urine, Random  Result Value Ref Range Status   Specimen Description   Final    URINE, RANDOM Performed at [facility], [address], [place], [place] [address]    Special Requests   Final    NONE Performed at [facility], [address] [place], [place] [address]    Culture (A)  Final    >=100,000 COLONIES/mL PSEUDOMONAS AERUGINOSA SUSCEPTIBILITIES TO FOLLOW Performed at [facility], [address] [place], [place] [address]    Report Status PENDING  Incomplete  Resp Panel by RT-PCR (Flu A&B, Covid) Nasopharyngeal Swab     Status: None   Collection Time: December 22, 2020 11:15 PM   Specimen: Nasopharyngeal Swab; Nasopharyngeal(NP) swabs in vial transport medium  Result Value Ref Range Status   SARS Coronavirus 2 by RT PCR NEGATIVE NEGATIVE Final    Comment: (NOTE) SARS-CoV-2 target nucleic acids are NOT DETECTED.  The SARS-CoV-2 RNA is  generally detectable in upper respiratory specimens during the acute phase of infection. The lowest concentration of SARS-CoV-2 viral copies this assay can detect is 138 copies/mL. A negative result does not preclude SARS-Cov-2 infection and should not be used as the sole basis for treatment or other patient management decisions. A negative result may occur with  improper specimen collection/handling, submission of specimen other than nasopharyngeal swab, presence of viral mutation(s) within the areas targeted by this assay, and inadequate number of viral copies(<138 copies/mL). A negative result must be combined with clinical observations, patient history, and epidemiological information. The expected result is Negative.  Fact Sheet for Patients:  BloggerCourse.com  Fact Sheet for Healthcare Providers:  SeriousBroker.it  This test is no t yet approved or cleared by the [place] FDA and  has been authorized for detection and/or diagnosis of SARS-CoV-2 by FDA under an Emergency Use Authorization (EUA). This EUA will remain  in effect (meaning this test can be used) for the duration of the COVID-19 declaration under Section 564(b)(1) of the Act, 21 U.S.C.section 360bbb-3(b)(1), unless the authorization is terminated  or revoked sooner.       Influenza A by PCR NEGATIVE NEGATIVE Final   Influenza B by PCR NEGATIVE NEGATIVE Final    Comment: (NOTE) The Xpert Xpress SARS-CoV-2/FLU/RSV plus assay is intended as an aid in the diagnosis of influenza from Nasopharyngeal swab specimens and should not be used as a sole basis for treatment. Nasal washings and aspirates are unacceptable for Xpert Xpress SARS-CoV-2/FLU/RSV testing.  Fact Sheet for Patients: BloggerCourse.com  Fact Sheet for Healthcare Providers: SeriousBroker.it  This test is not yet approved or cleared by the [place] FDA and has been authorized for detection and/or diagnosis of SARS-CoV-2 by FDA under an Emergency Use Authorization (EUA). This EUA will remain in effect (meaning this test can be used) for the duration of the COVID-19 declaration under Section 564(b)(1) of the Act, 21 U.S.C. section 360bbb-3(b)(1), unless the authorization is terminated or revoked.  Performed at [facility], [address] [place], [place] [address]   Culture, blood (Routine x 2)     Status: None (Preliminary result)   Collection Time: 12/09/2020 11:27 PM   Specimen: BLOOD  Result Value Ref Range Status   Specimen Description BLOOD BLOOD LEFT HAND  Final   Special Requests   Final    BOTTLES DRAWN AEROBIC AND ANAEROBIC Blood Culture results may not be optimal due to an inadequate volume of blood received in culture bottles   Culture   Final  NO GROWTH 1 DAY Performed at [facility], [address] [place], [place] [address]    Report Status PENDING  Incomplete  Culture, blood (Routine x 2)     Status: None (Preliminary result)   Collection Time: 12/18/20 12:06 AM   Specimen: BLOOD  Result Value Ref Range Status   Specimen Description BLOOD BLOOD LEFT FOREARM  Final   Special Requests   Final    BOTTLES DRAWN AEROBIC AND ANAEROBIC Blood Culture adequate volume   Culture   Final    NO GROWTH 1 DAY Performed at [facility], [address] [place], [place] [address]    Report Status PENDING  Incomplete  MRSA PCR Screening     Status: None   Collection Time: 12/18/20  8:52 AM   Specimen: Nasal Mucosa; Nasopharyngeal  Result Value Ref Range Status   MRSA by PCR NEGATIVE NEGATIVE Final    Comment:        The GeneXpert MRSA Assay (FDA approved for NASAL specimens only), is one component of a comprehensive MRSA colonization surveillance program. It is not intended to diagnose MRSA infection nor to guide or monitor treatment for MRSA infections. Performed at  [facility], [address] [place], [place] [address]     Coagulation Studies: Recent Labs    May 08, 2021 2316  LABPROT 13.8  INR 1.1    Urinalysis: Recent Labs    May 08, 2021 2307  COLORURINE AMBER*  LABSPEC 1.013  PHURINE 5.0  GLUCOSEU NEGATIVE  HGBUR MODERATE*  BILIRUBINUR NEGATIVE  KETONESUR NEGATIVE  PROTEINUR 100*  NITRITE NEGATIVE  LEUKOCYTESUR LARGE*      Imaging: DG Chest 1 View  Result Date: 12/18/2020 CLINICAL DATA:  65 year old female central line placement. EXAM: CHEST  1 VIEW COMPARISON:  Portable chest Jun 23, 2021 and earlier. FINDINGS: Portable AP view at 0633 hours. Left IJ approach central line placed, tip projects along the lateral margin of the right mediastinum at the level of the carina. No pneumothorax. Stable cardiac size and mediastinal contours. Platelike atelectasis in the right lower lung persists. Additional bilateral veiling pulmonary opacity. Overall ventilation has not significantly changed from yesterday. Small volume of oral contrast in the gastric fundus. Scoliosis. No acute osseous abnormality identified. IMPRESSION: 1. Left IJ central line placed with tip at the SVC level along the lateral mediastinal contour. Consider retracting up to 1 cm in case the catheter tip is up against lateral SVC wall. 2. No pneumothorax.  Stable ventilation. Electronically Signed   By: Odessa FlemingH  Hall M.D.   On: 12/18/2020 06:42   DG Abd 1 View  Result Date: 12/18/2020 CLINICAL DATA:  Nasogastric tube placement EXAM: ABDOMEN - 1 VIEW COMPARISON:  None FINDINGS: Nasogastric tube tip and side port in stomach. Gastrostomy catheter positioned in stomach as well. No appreciable bowel dilatation or air-fluid level to suggest bowel obstruction. No free air evident on supine examination. 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 IMPRESSION: Nasogastric tube tip and side port in stomach. Gastrostomy positioned in stomach as well. No bowel obstruction or free air  evident. Electronically Signed   By: Bretta BangWilliam  Woodruff III M.D.   On: 12/18/2020 15:06   CT HEAD WO CONTRAST  Result Date: 12/18/2020 CLINICAL DATA:  65 year old female with altered mental status. 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 EXAM: CT HEAD WITHOUT CONTRAST TECHNIQUE: Contiguous axial images were obtained from the base of the skull through the vertex without intravenous contrast. COMPARISON:  Brain MRI 12/06/2020, 12/05/2020. Head CTs 12/03/2020 and earlier.  FINDINGS: Brain: Chronic pineal region soft tissue mass as described on MRI, with mild if any growth since CT 02/22/2017. Stable ventricle size and configuration without convincing ventriculomegaly or transependymal edema. No midline shift. No acute intracranial hemorrhage identified. No cortically based acute infarct identified. Stable gray-white matter differentiation throughout the brain. Vascular: Calcified atherosclerosis at the skull base. Skull: Stable.  No acute osseous abnormality identified. Sinuses/Orbits: Visualized paranasal sinuses and mastoids are stable and well pneumatized. Other: Visualized orbits and scalp soft tissues are within normal limits. IMPRESSION: 1. No acute intracranial abnormality. 2. Noncontrast CT appearance of the brain not significantly changed since 2018. 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 Electronically Signed   By: Odessa Fleming M.D.   On: 12/18/2020 04:40   DG Chest Portable 1 View  Result Date: 19-Dec-2020 CLINICAL DATA:  Shortness of breath, altered mental status. EXAM: PORTABLE CHEST 1 VIEW COMPARISON:  chest x-ray 12/13/2020 FINDINGS: Interval removal of left internal jugular central venous catheter and right subclavian catheter. The heart size and mediastinal contours are unchanged. Low lung volumes. Interval development of bilateral trace to small volume pleural effusions, right greater than left. Interval increase in patchy airspace opacities of bilateral mid to lower lung zones. Increased  interstitial markings within bilateral upper lung zones. No pneumothorax. No acute osseous abnormality. IMPRESSION: Diffuse patchy airspace opacities suggestive of infection/inflammation with associated bilateral trace to small view volume pleural effusions and likely superimposed pulmonary edema. Electronically Signed   By: Tish Frederickson M.D.   On: Dec 19, 2020 23:05   US Abdomen Limited RUQ (LIVER/GB)  Result Date: 12/18/2020 CLINICAL DATA:  Elevated liver enzymes EXAM: ULTRASOUND ABDOMEN LIMITED RIGHT UPPER QUADRANT COMPARISON:  None. FINDINGS: Gallbladder: No gallstones or wall thickening visualized. No pericholecystic fluid. There is mild sludge in the gallbladder. No sonographic Murphy sign noted by sonographer. Common bile duct: Not appreciable due to body habitus and overlying gas. No biliary duct dilatation is appreciable. Liver: No focal lesion identified. Within normal limits in parenchymal echogenicity. Portal vein is patent on color Doppler imaging with normal direction of blood flow towards the liver. Other: None. IMPRESSION: 1. Mild sludge in gallbladder. No gallstones, gallbladder wall thickening, or pericholecystic fluid. 2. Common bile duct could not be seen with certainty. No biliary ductal dilatation seen. If there is concern for biliary duct pathology, MRCP would be the optimum study of choice for further assessment. 3.  No appreciable liver lesions. Electronically Signed   By: Bretta Bang III M.D.   On: 12/18/2020 10:55     Medications:   . ceFEPime (MAXIPIME) IV Stopped (12/19/20 1009)  . feeding supplement (OSMOLITE 1.2 CAL) 1,000 mL (12/19/20 0509)  . vancomycin Stopped (12/18/20 0305)   . amiodarone  200 mg Per Tube Daily  . chlorhexidine  15 mL Mouth Rinse BID  . Chlorhexidine Gluconate Cloth  6 each Topical Daily  . DULoxetine  30 mg Oral Daily  . heparin  5,000 Units Subcutaneous Q8H  . mouth rinse  15 mL Mouth Rinse q12n4p  . mirtazapine  45 mg Oral QHS  .  OLANZapine  5 mg Oral QHS  . pantoprazole (PROTONIX) IV  40 mg Intravenous QHS  . sodium zirconium cyclosilicate  10 g Per Tube TID   albuterol, docusate sodium, polyethylene glycol  Assessment/ Plan:  65 y.o. female with diabetes mellitus type II, hypertension, osteoarthritis, hyperlipidemia, aortic atherosclerosis who was [facility] to [facility] on 2020-12-19   Active Problems:   Altered mental status   #. AKI -  Baseline Creatinine of 0.95 with normal GFR >60 on 12/10/13/2019.  Recent Labs    09-Jan-2021 2316 12/18/20 0007 12/18/20 1536 12/19/20 0326  CREATININE 2.28* 2.35* 2.42* 2.40*   Pertinent studies: Imaging: CT abdomen without contrast on December 03, 2020.  No evidence of obstructive uropathy or nephrolithiasis. Urinalysis: 2021/01/09: Moderate hemoglobin, large leukocyte, protein 100, greater than 50 RBCs greater than 50 WBCs Serum creatinine is slowly worsening since early March.  Creatinine of 1.05 on December 07, 2020 Differential diagnosis includes interstitial nephritis vs prolonged ATN  #Generalized edema Likely third spacing from low albumin Supportive care for now   #.  Hyperkalemia Potassium level of 6.3 at admission Treated with shifting measures Agree with continuing [place] Lab Results  Component Value Date   K 5.4 (H) 12/19/2020     #Urinary tract infection Urine culture from Jan 09, 2021 is growing greater than 100,000 Pseudomonas aeruginosa Currently being treated with cefepime and vancomycin       LOS: 1 [facility] 3/14/20221:57 PM  [facility] [place], [place] [phone]

## 2020-12-19 NOTE — Progress Notes (Signed)
Patient Details Name: Chelsea Glass MRN: [id number] DOB: 1956-01-08   Cancelled treatment:    Chart reviewed. Pt is very familiar to ST services and was seen during last recent admission (discharged 12/16/20) when she received PEG tube placement for poor intake and dysphagia. Once consistently alert Pt may be offered mechanical soft/ cut up foods per last treatment recsSLP Cancellation NoteOtherwise rec continue nutrition though feeding tube. See evaluation and treatment notes from last admission. Please notify ST if we can be of further assistance.Will hold off on formal assessment.        Eather Colas 12/19/2020, 11:58 AM  .

## 2020-12-19 NOTE — Progress Notes (Addendum)
Nutrition Initial Assessment   DOCUMENTATION CODES:   Obesity unspecified  INTERVENTION:   Change to Nepro 1.8 @50ml /hr- Initiate at 89ml/hr and increase by 95ml/hr q 12 hours until goal rate is reached.  Pro-Source 9m tube, provides 40kcal and 11g of protein per serving   Free water flushes q4 hours   Regimen provides 2240kcal/day 119g/day protein and 157ml/day free water  Recommend vitamin C 250mg  BID via tube  NUTRITION DIAGNOSIS:   Inadequate oral intake related to lethargy/confusion as evidenced by meal completion < 25%.  GOAL:   Patient will meet greater than or equal to 90% of their needs   MONITOR:   PO intake,Labs,Weight trends,TF tolerance,Skin,I & O's  ASSESSMENT:   65 y.o. African-American female with medical history significant for osteoarthritis, type 2 diabetes mellitus, hypertension and COVID-19 in September 2021 who is admitted with failure to thrive  Pt s/p IR G-tube 3/4  RD familiar with this pt from her previous admit. Pt discharged 3/11 and returned 3/12 for AMS. Pt has been receiving tube feeds at home via G-tube along with eating some food by mouth. Pt currently tolerating Osmolite 1.2 @20ml /hr. RD will change pt to Nepro in setting of hyperkalemia. Pt receiving reglan at home as she reported fullness with feeds.   Medications reviewed and include: heparin, remeron, protonix, cefepime   Labs reviewed: Na 133(L), K 5.4(H), BUN 56(H), creat 2.40(H), P 4.6 wnl, Mg 2.3 wnl Wbc- wbc- 12.8(H), Hgb 7.2(L), Hct 23.1(L)  Diet Order:   Diet Order            Diet NPO time specified  Diet effective now                EDUCATION NEEDS:   No education needs have been identified at this time  Skin:  Skin Assessment: Reviewed RN Assessment (ecchymosis, Stage II L elbowm Stage II b/l buttocks, MASD)  Last BM:  3/11- type 7  Height:   Ht Readings from Last 1 Encounters:  12/03/20 5\' 5"  (1.651 m)    Weight:   Wt Readings from  Last 1 Encounters:  12/19/20 128 kg    Ideal Body Weight:  56.8 kg  BMI:  Body mass index is 46.96 kg/m.  Estimated Nutritional Needs:   Kcal:  2000-2300kcal/day  Protein:  100-115g/day  Fluid:  1.7-2.0L/day  5/11 MS, RD, LDN Please refer to [facility] for RD and/or RD on-call/weekend/after hours pager

## 2020-12-19 NOTE — Consult Note (Signed)
Consultation Note Date: 12/19/2020   Patient Name: Chelsea Glass  DOB: 1956/02/12  MRN: [id number]  Age / Sex: 65 y.o., female  PCP: [facility] Referring Physician: Erin Fulling, MD  Reason for Consultation: Establishing goals of care  HPI/Patient Profile: 65 year old female who presented to the emergency department from home due to her daughter's concerns of shortness of breath and altered mental status.Patient was recently discharged on 12/16/2020 after a complicated hospital stay for AKI,altered mental status,failure to thrive and atrial fibrillation with rapid ventricular response.  Clinical Assessment and Goals of Care: Patient is resting in bed, and does not respond.  No family at bedside.  Patient was evaluated by PMT team on most recent admission, more specifically by myself.  Called to speak to daughter Chelsea Glass.  Chelsea Glass states that her mother was only  home for one day, began having difficulty breathing, and she had to call an ambulance.    We discussed her diagnosis, prognosis, GOC, EOL wishes disposition and options.  A detailed discussion was had today regarding advanced directives.  Concepts specific to code status, artifical feeding and hydration, IV antibiotics and rehospitalization were discussed.  The difference between an aggressive medical intervention path and a comfort care path was discussed.  Values and goals of care important to patient and family were attempted to be elicited.  Discussed limitations of medical interventions to prolong quality of life in some situations and discussed the concept of human mortality.  She understands her mother status.  We discussed the goals from last admission of her being able to become functionally independent and live alone again.  We discussed her overall frailty, and how the body is integral.  This admission, she   discusses prognosis.  I discussed with her my thoughts on her mother's poor prognosis overall, and answered her questions.  Discussed the best and worst case scenarios.  Discussed CODE STATUS.  She states she will consider all of this, and we can discuss this further tomorrow.    SUMMARY OF RECOMMENDATIONS   We began discussing goals of care and discussed CODE STATUS.  She is considering.  Will speak to daughter more tomorrow.   Prognosis:   Very poor      Primary Diagnoses: Present on Admission:  Altered mental status   I have reviewed the medical record, interviewed the patient and family, and examined the patient. The following aspects are pertinent.  Past Medical History:  Diagnosis Date   Arthritis    Diabetes mellitus without complication (HCC)    Hypertension    Social History   Socioeconomic History   Marital status: Widowed    Spouse name: Not on file   Number of children: Not on file   Years of education: Not on file   Highest education level: Not on file  Occupational History   Not on file  Tobacco Use   Smoking status: Never Smoker   Smokeless tobacco: Never Used  Substance and Sexual Activity   Alcohol use: No   Drug use:  No   Sexual activity: Not on file  Other Topics Concern   Not on file  Social History Narrative   Not on file   Social Determinants of Health   Financial Resource Strain: Not on file  Food Insecurity: Not on file  Transportation Needs: Not on file  Physical Activity: Not on file  Stress: Not on file  Social Connections: Not on file   No family history on file. Scheduled Meds:  amiodarone  200 mg Per Tube Daily   vitamin C  250 mg Per Tube BID   chlorhexidine  15 mL Mouth Rinse BID   Chlorhexidine Gluconate Cloth  6 each Topical Daily   DULoxetine  30 mg Oral Daily   [START ON 12/20/2020] feeding supplement (PROSource TF)  45 mL Per Tube BID   free water  120 mL Per Tube Q4H   heparin  5,000 Units  Subcutaneous Q8H   mouth rinse  15 mL Mouth Rinse q12n4p   metoCLOPramide  5 mg Per Tube TID AC   mirtazapine  45 mg Oral QHS   OLANZapine  5 mg Oral QHS   pantoprazole (PROTONIX) IV  40 mg Intravenous QHS   sodium zirconium cyclosilicate  10 g Per Tube TID   Continuous Infusions:  ceFEPime (MAXIPIME) IV Stopped (12/19/20 1009)   feeding supplement (NEPRO CARB STEADY)     vancomycin Stopped (12/18/20 0305)   PRN Meds:.albuterol, docusate sodium, polyethylene glycol Medications Prior to Admission:  Prior to Admission medications   Medication Sig Start Date End Date Taking? Authorizing Provider  amiodarone (PACERONE) 200 MG tablet Take 1 tablet (200 mg total) by mouth daily. 01/02/2021  Yes Enedina Finner, MD  ascorbic acid (VITAMIN C) 250 MG tablet Place 1 tablet (250 mg total) into feeding tube 2 (two) times daily. 12/16/20  Yes Enedina Finner, MD  diclofenac Sodium (VOLTAREN) 1 % GEL Apply 2 g topically 4 (four) times daily. 12/16/20  Yes Enedina Finner, MD  DULoxetine (CYMBALTA) 30 MG capsule Take 30 mg by mouth daily. 11/18/20  Yes [provider]  esomeprazole (NEXIUM) 20 MG capsule Take 1 capsule (20 mg total) by mouth daily at 12 noon. 12/16/20  Yes Enedina Finner, MD  folic acid (FOLVITE) 1 MG tablet Place 1 tablet (1 mg total) into feeding tube daily. 12/11/2020  Yes Enedina Finner, MD  lidocaine (LIDODERM) 5 % Place 1 patch onto the skin daily. Remove & Discard patch within 12 hours or as directed by MD 12/16/20  Yes Enedina Finner, MD  metoCLOPramide (REGLAN) 5 MG/5ML solution Take 5 mLs (5 mg total) by mouth 3 (three) times daily before meals. 12/16/20  Yes Enedina Finner, MD  metoprolol succinate (TOPROL-XL) 25 MG 24 hr tablet Take 1 tablet (25 mg total) by mouth daily. 12/21/2020  Yes Enedina Finner, MD  mirtazapine (REMERON SOL-TAB) 45 MG disintegrating tablet Take 1 tablet (45 mg total) by mouth at bedtime. 12/16/20  Yes Enedina Finner, MD  OLANZapine (ZYPREXA) 5 MG tablet Place 1 tablet (5 mg  total) into feeding tube at bedtime. 12/16/20  Yes Enedina Finner, MD  liver oil-zinc oxide (DESITIN) 40 % ointment Apply topically 3 (three) times daily as needed for irritation. 12/16/20   Enedina Finner, MD  midodrine (PROAMATINE) 10 MG tablet Place 1 tablet (10 mg total) into feeding tube 3 (three) times daily with meals. 12/16/20   Enedina Finner, MD  Nutritional Supplements (FEEDING SUPPLEMENT, OSMOLITE 1.5 CAL,) LIQD Place 1,000 mLs into feeding tube continuous. 12/16/20  Enedina Finner, MD  Nutritional Supplements (FEEDING SUPPLEMENT, PROSOURCE TF,) liquid Place 45 mLs into feeding tube 2 (two) times daily. 12/16/20   Enedina Finner, MD  polyethylene glycol (MIRALAX / GLYCOLAX) 17 g packet Place 17 g into feeding tube 2 (two) times daily. 12/16/20   Enedina Finner, MD  traMADol (ULTRAM) 50 MG tablet Take 1 tablet (50 mg total) by mouth every 12 (twelve) hours as needed for moderate pain or severe pain. 12/16/20   Enedina Finner, MD  Water For Irrigation, Sterile (FREE WATER) SOLN Place 100 mLs into feeding tube 6 (six) times daily. 12/16/20   Enedina Finner, MD   Allergies  Allergen Reactions   Lisinopril Swelling   Review of Systems  Unable to perform ROS   Physical Exam Constitutional:      Comments: Patient does not respond to voice or touch.     Vital Signs: BP 137/67    Pulse 89    Temp (!) 97.5 F (36.4 C) (Axillary)    Resp 19    Wt 128 kg    SpO2 98%    BMI 46.96 kg/m  Pain Scale: CPOT   Pain Score: 0-No pain   SpO2: SpO2: 98 % O2 Device:SpO2: 98 % O2 Flow Rate: .O2 Flow Rate (L/min): 3 L/min  IO: Intake/output summary:   Intake/Output Summary (Last 24 hours) at 12/19/2020 1551 Last data filed at 12/19/2020 1100 Gross per 24 hour  Intake 422 ml  Output 790 ml  Net -368 ml    LBM: Last BM Date: 12/16/20 Baseline Weight: Weight: 124.3 kg Most recent weight: Weight: 128 kg       Time In: 3:15 Time Out: 3:50 Time Total:35 min Greater than 50%  of this time was spent counseling and  coordinating care related to the above assessment and plan.  Signed by: Morton Stall, NP   Please contact Palliative Medicine Team phone at [phone] for questions and concerns.  For individual provider: See Loretha Stapler

## 2020-12-19 NOTE — Progress Notes (Signed)
PHARMACY CONSULT NOTE  Pharmacy Consult for Electrolyte Monitoring and Replacement   Recent Labs: Potassium (mmol/L)  Date Value  12/19/2020 5.4 (H)   Magnesium (mg/dL)  Date Value  04/06/1600 2.3   Calcium (mg/dL)  Date Value  09/32/3557 9.6   Albumin (g/dL)  Date Value  32/20/2542 3.2 (L)   Phosphorus (mg/dL)  Date Value  70/62/3762 4.6   Sodium (mmol/L)  Date Value  12/19/2020 133 (L)    Assessment: 65 y.o. femalewith medical history significant forosteoarthritis, type 2 diabetes mellitus and hypertension, as well as COVID-19 in September 2021 who presented to the emergency department from home due to her daughter's concerns of shortness of breath and altered mental status.  She had hyperkalemia on admission which has improved following treatment.  Goal of Therapy:  Electrolytes WNL  Plan:   Lokelma 10 grams TID per NP (2 more doses due following the above lab values  Re-check potassium at 1800 following final Lokelma administration  Re-check electrolytes in am  Lowella Bandy ,PharmD Clinical Pharmacist 12/19/2020 9:12 AM

## 2020-12-20 ENCOUNTER — Inpatient Hospital Stay: Payer: Medicare Other

## 2020-12-20 DIAGNOSIS — Z7189 Other specified counseling: Secondary | ICD-10-CM | POA: Diagnosis not present

## 2020-12-20 DIAGNOSIS — N390 Urinary tract infection, site not specified: Secondary | ICD-10-CM

## 2020-12-20 DIAGNOSIS — J189 Pneumonia, unspecified organism: Secondary | ICD-10-CM | POA: Diagnosis not present

## 2020-12-20 DIAGNOSIS — R4182 Altered mental status, unspecified: Secondary | ICD-10-CM

## 2020-12-20 LAB — BASIC METABOLIC PANEL
Anion gap: 8 (ref 5–15)
BUN: 58 mg/dL — ABNORMAL HIGH (ref 8–23)
CO2: 19 mmol/L — ABNORMAL LOW (ref 22–32)
Calcium: 8.9 mg/dL (ref 8.9–10.3)
Chloride: 107 mmol/L (ref 98–111)
Creatinine, Ser: 2.64 mg/dL — ABNORMAL HIGH (ref 0.44–1.00)
GFR, Estimated: 19 mL/min — ABNORMAL LOW (ref >60)
Glucose, Bld: 85 mg/dL (ref 70–99)
Potassium: 5.6 mmol/L — ABNORMAL HIGH (ref 3.5–5.1)
Sodium: 134 mmol/L — ABNORMAL LOW (ref 135–145)

## 2020-12-20 LAB — URINE CULTURE: Culture: >=100000 — AB

## 2020-12-20 LAB — GLUCOSE, CAPILLARY
Glucose-Capillary: 94 mg/dL (ref 70–99)
Glucose-Capillary: 91 mg/dL (ref 70–99)
Glucose-Capillary: 75 mg/dL (ref 70–99)
Glucose-Capillary: 95 mg/dL (ref 70–99)
Glucose-Capillary: 109 mg/dL — ABNORMAL HIGH (ref 70–99)

## 2020-12-20 LAB — THYROID PANEL WITH TSH
Free Thyroxine Index: 1 — ABNORMAL LOW (ref 1.2–4.9)
T3 Uptake Ratio: 31 % (ref 24–39)
T4, Total: 3.2 ug/dL — ABNORMAL LOW (ref 4.5–12.0)
TSH: 8.22 u[IU]/mL — ABNORMAL HIGH (ref 0.450–4.500)

## 2020-12-20 LAB — CBC
HCT: 25.5 % — ABNORMAL LOW (ref 36.0–46.0)
Hemoglobin: 7.9 g/dL — ABNORMAL LOW (ref 12.0–15.0)
MCH: 29 pg (ref 26.0–34.0)
MCHC: 31 g/dL (ref 30.0–36.0)
MCV: 93.8 fL (ref 80.0–100.0)
Platelets: 281 10*3/uL (ref 150–400)
RBC: 2.72 MIL/uL — ABNORMAL LOW (ref 3.87–5.11)
RDW: 23.6 % — ABNORMAL HIGH (ref 11.5–15.5)
WBC: 10.4 10*3/uL (ref 4.0–10.5)
nRBC: 3.1 % — ABNORMAL HIGH (ref 0.0–0.2)

## 2020-12-20 LAB — POTASSIUM: Potassium: 4.6 mmol/L (ref 3.5–5.1)

## 2020-12-20 LAB — MAGNESIUM: Magnesium: 2.2 mg/dL (ref 1.7–2.4)

## 2020-12-20 LAB — PHOSPHORUS: Phosphorus: 4.6 mg/dL (ref 2.5–4.6)

## 2020-12-20 IMAGING — DX DG CHEST 1V PORT
1 series · 1 of 1 positions shown · non-contrast
Comparison: [DATE], CT [DATE], radiograph [DATE],
[DATE]

CLINICAL DATA: Shortness of breath

EXAM:
PORTABLE CHEST 1 VIEW

[chest ap]
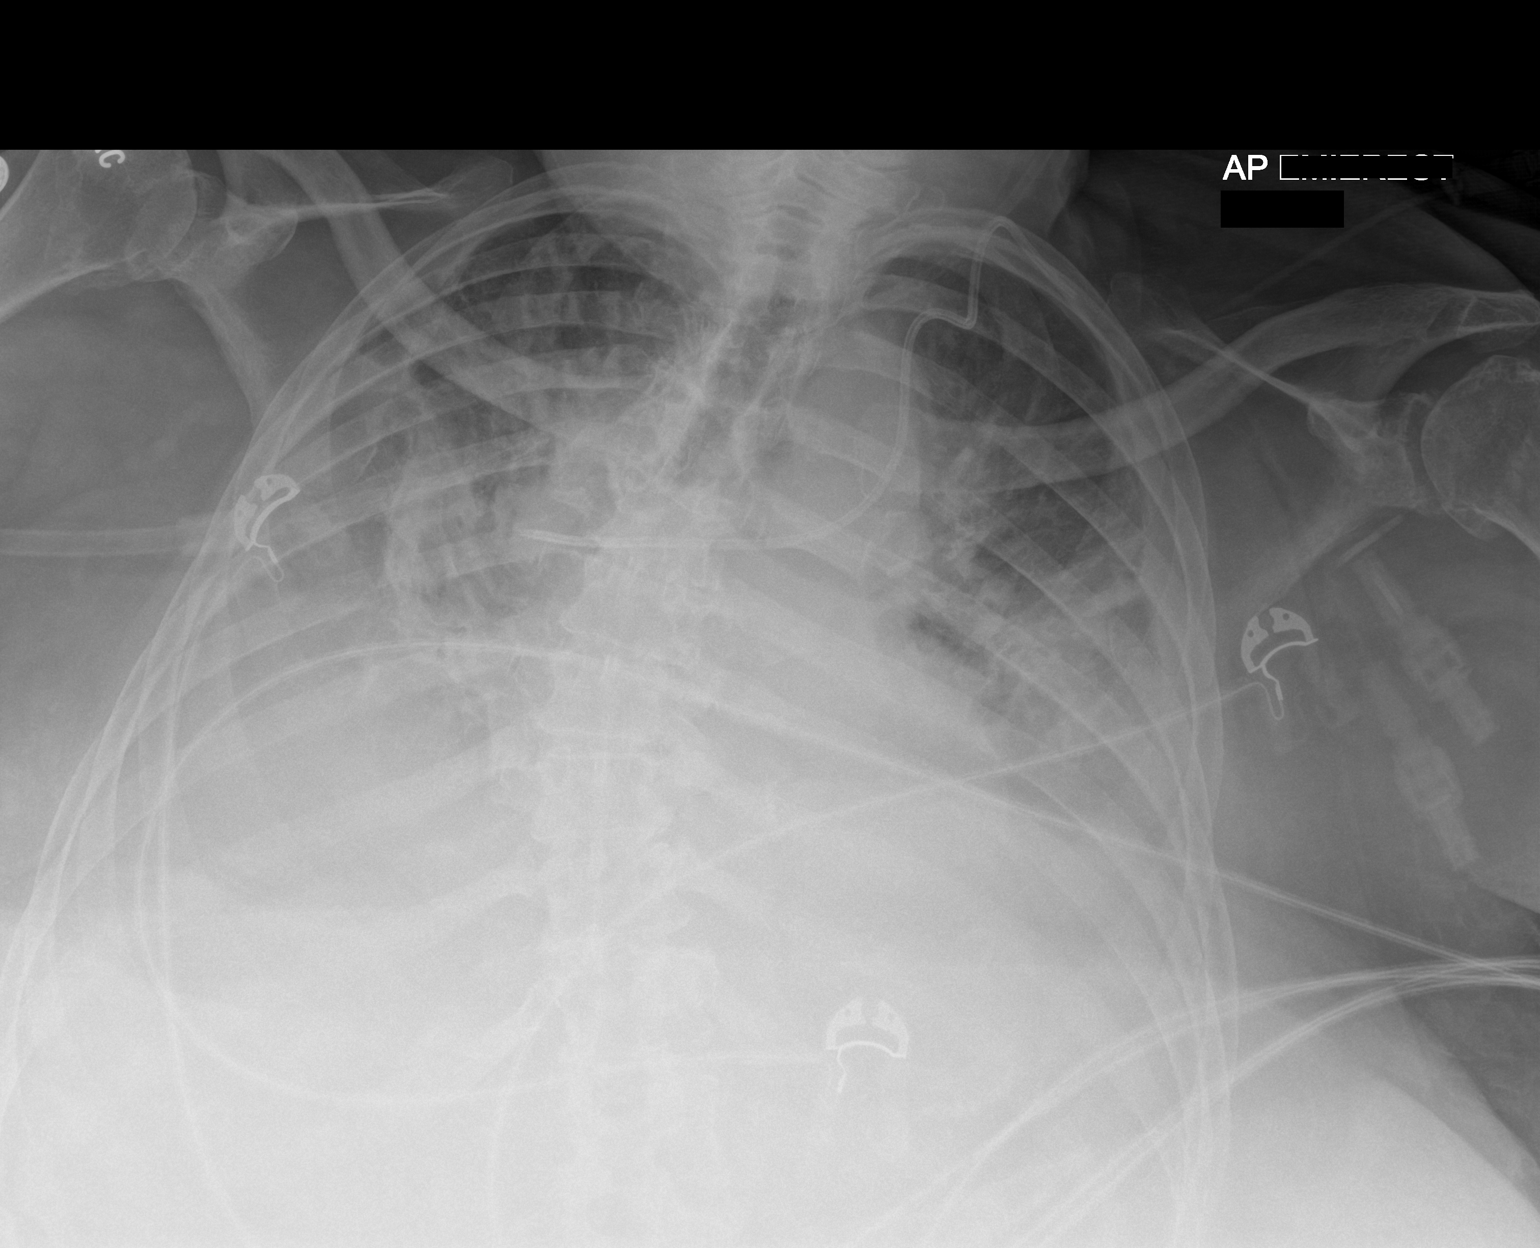

[1 of 1 positions shown; findings below may reference images not displayed]

FINDINGS: Left IJ central venous catheter tip directed to the patient's right,
in the general region of SVC origin but potentially directed against
lateral wall of SVC. Bilateral pleural effusions and worsening
airspace disease in the right greater than left chest. Obscured
cardiomediastinal silhouette. No pneumothorax.
IMPRESSION: Bilateral pleural effusions and slight worsening of airspace disease
in the right greater than left chest. Left IJ central venous
catheter tip directed to patient's right in the general region of
SVC though question directed towards the lateral wall.

## 2020-12-20 MED ORDER — SCOPOLAMINE 1 MG/3DAYS TD PT72
1.0000 | MEDICATED_PATCH | TRANSDERMAL | Status: DC
  Administered 2020-12-20: 1.5 mg via TRANSDERMAL
  Filled 2020-12-20: qty 1

## 2020-12-20 MED ORDER — SODIUM CHLORIDE 0.9 % IV SOLN
2.0000 g | INTRAVENOUS | Status: DC
  Administered 2020-12-21: 2 g via INTRAVENOUS
  Filled 2020-12-20: qty 2
  Filled 2020-12-20: qty 1.94

## 2020-12-20 MED ORDER — SODIUM CHLORIDE 0.9 % IV SOLN: INTRAVENOUS | Status: DC | PRN

## 2020-12-20 NOTE — Progress Notes (Addendum)
Daily Progress Note   Patient Name: Chelsea Glass       Date: 12/20/2020 DOB: 1956/04/15  Age: 65 y.o. MRN#: [id number] Attending Physician: Arnetha Courser, MD Primary Care Physician: [facility] Admit Date: 12/10/2020  Reason for Consultation/Follow-up: Establishing goals of care  Subjective: Patient is resting in bed, no family at bedside. She is not responsive to voice or touch. Patient with audible secretions noted in upper airway with breathing. RT at bedside. Suctioned patient, white sputum/salavia in canister. Minimal gag reflex with suction and patient does not appear to be protecting her airway. She slightly and weakly moves 1 arm toward her mouth during suction. RT and nursing speaking with attending MD regarding transfer to ICU and intubation.   Called to speak with daughter. Discussed concerns for her status in detail. Discussed poor prognosis and multiple senerios. She is frustrated about her mother's previous discharge. She hangs up with me to speak with other family members. She calls back and states she does not want her mother intubated. She states she wants to continue current care, and if she declines she would shift to comfort care. She states she does not want CPR. She would want BIPAP and high flow cannula. Discussed the purpose of a MOST form and outlining her wishes. Attempted to complete an electronic MOST form via phone,  however she states she is driving and unable to do so. She states she will call back in 10 minutes. Around half an hour later I called her back with no answer.   ADDENDUM: Reached out to daughter an additional time. She states she is ready to complete the form. She does have a brother Redell Bhandari whom she conference called. He was  updated and states he is allowing his sister to make decisions for their mother and is okay with her decisions. Electronic MOST form completed with her.   I completed a MOST form today and the signed original was placed in the chart. A photocopy was placed in the chart to be scanned into EMR. The patient outlined their wishes for the following treatment decisions:  Cardiopulmonary Resuscitation: Do Not Attempt Resuscitation (DNR/No CPR)  Medical Interventions: Limited Additional Interventions: Use medical treatment, IV fluids and cardiac monitoring as indicated, DO NOT USE intubation or mechanical ventilation. May consider use of less invasive  airway support such as BiPAP or CPAP. Also provide comfort measures. Transfer to the hospital if indicated. Avoid intensive care.   Antibiotics: Antibiotics if indicated  IV Fluids: IV fluids if indicated  Feeding Tube: Feeding tube long-term if indicated       Length of Stay: 2  Current Medications: Scheduled Meds:   amiodarone  200 mg Per Tube Daily   vitamin C  250 mg Per Tube BID   chlorhexidine  15 mL Mouth Rinse BID   Chlorhexidine Gluconate Cloth  6 each Topical Daily   DULoxetine  30 mg Oral Daily   feeding supplement (PROSource TF)  45 mL Per Tube BID   free water  120 mL Per Tube Q4H   heparin  5,000 Units Subcutaneous Q8H   mouth rinse  15 mL Mouth Rinse q12n4p   metoCLOPramide  5 mg Per Tube TID AC   mirtazapine  45 mg Oral QHS   OLANZapine  5 mg Oral QHS   pantoprazole (PROTONIX) IV  40 mg Intravenous QHS   scopolamine  1 patch Transdermal Q72H   sodium zirconium cyclosilicate  10 g Per Tube Daily    Continuous Infusions:  sodium chloride 10 mL/hr at 12/20/20 1135   [START ON 12/21/2020] ceFEPime (MAXIPIME) IV     feeding supplement (NEPRO CARB STEADY) 1,000 mL (12/20/20 1228)    PRN Meds: sodium chloride, albuterol, docusate sodium, polyethylene glycol  Physical Exam Constitutional:      Comments: Eyes  closed. Not responsive.              Vital Signs: BP 140/74    Pulse 96    Temp 98 F (36.7 C)    Resp 20    Wt 125.3 kg    SpO2 93%    BMI 45.97 kg/m  SpO2: SpO2: 93 % O2 Device: O2 Device: Nasal Cannula O2 Flow Rate: O2 Flow Rate (L/min): 2 L/min  Intake/output summary:   Intake/Output Summary (Last 24 hours) at 12/20/2020 1442 Last data filed at 12/20/2020 0000 Gross per 24 hour  Intake 546.17 ml  Output 110 ml  Net 436.17 ml   LBM: Last BM Date: 12/20/20 Baseline Weight: Weight: 124.3 kg Most recent weight: Weight: 125.3 kg        Patient Active Problem List   Diagnosis Date Noted   Altered mental status 12/18/2020   Pressure injury of skin 12/16/2020   Tachycardia    Weight loss    Adult failure to thrive    Depression    Acute kidney failure, unspecified (HCC) 12/03/2020   Postmenopausal bleeding    Hypertension    Acute respiratory failure with hypoxia (HCC) 06/13/2020   Obesity, Class III, BMI 40-49.9 (morbid obesity) (HCC) 06/13/2020   Severe sepsis (HCC) 06/13/2020   Atrial fibrillation (HCC) 06/13/2020   Pneumonia due to COVID-19 virus 06/12/2020    Palliative Care Assessment & Plan    Recommendations/Plan: Daughter verbalizes she does not want CPR or for her mother to be placed on a ventilator.  DNR/DNI     Code Status:    Code Status Orders  (From admission, onward)         Start     Ordered   12/18/20 0039  Full code  Continuous        12/18/20 0040        Code Status History    Date Active Date Inactive Code Status Order ID Comments User Context   12/09/2020 1206 12/21/2020 0237 Full Code  [id number]  Ernestene Kiel Inpatient   12/03/2020 2042 12/09/2020 1206 Full Code [id number]  [facility]ville [facility] Vernetta Honey, MD Inpatient   06/12/2020 1804 06/27/2020 2002 Full Code [id number]  Lurene Shadow, MD ED   Advance Care Planning Activity      Prognosis:  < 2 weeks   Care plan was discussed with RN  Thank you for allowing the  Palliative Medicine Team to assist in the care of this patient.   Time In: 2:40 3:30 Time Out: 3:20 3:45 Total Time 50 min 15 min 65 min Prolonged Time Billed  yes       Greater than 50%  of this time was spent counseling and coordinating care related to the above assessment and plan.  Morton Stall, NP  Please contact Palliative Medicine Team phone at [phone] for questions and concerns.

## 2020-12-20 NOTE — Progress Notes (Signed)
Patient transferred to Room 227 by this RN and Dow Adolph, RN. Reported to RN on floor. VSS

## 2020-12-20 NOTE — Progress Notes (Signed)
PROGRESS NOTE    Chelsea Glass  HYW:[id number] DOB: [birth year] DOA: December 20, 2020 PCP: [facility]   Brief Narrative: Taken from H&P and prior notes. 65 year old female who presented to the emergency department from home due to her daughter's concerns of shortness of breath and altered mental status.  Patient was recently admitted for 2 weeks, discharged on 12/16/2020 after having a complicated hospital stay for AKI, altered mental status, failure to thrive and atrial fibrillation with RVR.  Apparently patient had Covid in September 2021 and since then she is suffering with encephalopathy, failure to thrive, has lost more than 100 pounds for her daughter, PEG tube was placed on 12/09/2020 during recent hospitalization to help with feeding. Renal function continued to get worse. Patient with a lot of upper respiratory secretions, very weak cough and no gag reflex, unable to protect her airway, initially admitted with ICU, per Dr. Belia Heman note not a candidate for intubation as she has very poor prognosis and very remote chance of any meaningful recovery. Palliative care was also consulted-having discussions with daughter but patient currently is full code with full scope of medical care.  Concern of sepsis secondary to pneumonia and UTI, given cefepime and vancomycin. Vancomycin was discontinued and she was continued with cefepime.  Multiple electrolyte abnormalities which include hyponatremia, hyperkalemia, azotemia, elevated liver enzymes, leukocytosis and anemia.  Patient is very high risk for deterioration and death.  Subjective: Patient was unresponsive when seen today.  A lot of gurgling sounds from upper respiratory secretions, very weak cough.  Daughter at bedside.  Assessment & Plan:   Active Problems:   Altered mental status  Sepsis secondary to pneumonia and UTI.  Patient is unable to protect her airway so high risk for aspiration pneumonia.  Urine cultures with  Pseudomonas aeruginosa and good sensitivity.  A lot of upper respiratory secretions and gurgling sounds. Not a candidate for intubation per PCCM note secondary to poor chances of any meaningful recovery.  Patient has become nonverbal and completely dependent for all her ADLs, progressively declining since she had Covid in September 2021. -Continue with cefepime -Scopolamine patch to help with upper respiratory secretions. -Patient is appropriate for comfort care. -Appreciate palliative care help-ongoing discussion  AKI.  Worsening creatinine.  Does not need dialysis at this time per nephrology. -Continue to monitor. -Avoid nephrotoxins  Atrial fibrillation.  Currently rate controlled.  Not a candidate for anticoagulation. -Continue amiodarone and beta-blocker.  Anemia of chronic disease.  There was some concern of GI bleed during prior hospitalization.  GI was consulted and no GI further work-up was recommended at that time.  Patient received 2 unit of PRBC and discharge hemoglobin was 8.3.  Hemoglobin at 7.9 this morning. -Monitor hemoglobin -Transfuse if below 7  Anasarca.  Secondary to hypoalbuminemia.  She received IV albumin during recent hospitalization.  Lasix was discontinued by nephrology due to worsening renal function. -Nephrology is recommending supportive care at this time.  Hyperkalemia.  Patient was discharged on [facility].  Received calcium gluconate and insulin on admission.  Potassium at 5.6 but sample was hemolyzed, repeat at 4.6. -Continue to monitor -Lokelma as needed  Pineal gland mass??  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  Neurosurgery was consulted and they were recommending outpatient follow-up once recovered from this acute illness.  Type 2 diabetes mellitus. -SSI  Failure to thrive.  Patient with failure to thrive, PEG tube was placed on 12/09/2020 to help with  nutrition.   Multiple pressure injuries. -Continue with tube feed.  Patient is very high risk for deterioration and death-appropriate for comfort measures.  Objective: Vitals:   12/20/20 0435 12/20/20 0443 12/20/20 0757 12/20/20 1135  BP: 134/74  116/79 140/74  Pulse: 97  86 96  Resp: Temp:   98.5 F (36.9 C) 98 F (36.7 C)  TempSrc:   Axillary   SpO2: 99%  93% 93%  Weight:  125.3 kg      Intake/Output Summary (Last 24 hours) at 12/20/2020 1431 Last data filed at 12/20/2020 0000 Gross per 24 hour  Intake 546.17 ml  Output 110 ml  Net 436.17 ml   Filed Weights   12/18/20 0500 12/19/20 0500 12/20/20 0443  Weight: 124.3 kg 128 kg 125.3 kg    Examination:  General exam: Chronically ill-appearing, unresponsive lady, a lot of gurgling sounds. Respiratory system: A lot of transmitted upper respiratory sounds with few basal crackles. Cardiovascular system: S1 & S2 heard, RRR.  Gastrointestinal system: Soft, bowel sounds positive. Central nervous system: Unresponsive, not following any commands. Extremities: 1+ LE edema,pulses intact and symmetrical. Psychiatry: Judgement and insight appear impaired.    DVT prophylaxis: SCDs Code Status: Full Family Communication: Discussed with daughter at bedside. Disposition Plan:  Status is: Inpatient  Remains inpatient appropriate because:Inpatient level of care appropriate due to severity of illness   Dispo: The patient is from: Home              Anticipated d/c is to: To be determined              Patient currently is not medically stable to d/c.   Difficult to place patient No              Level of care: Med-Surg  All the records are reviewed and case discussed with Care Management/Social Worker. Management plans discussed with the patient, nursing and they are in agreement.  Consultants:   PCCM  Nephrology  Procedures:  Antimicrobials:  Cefepime  Data Reviewed: I have personally reviewed following labs and imaging  studies  CBC: Recent Labs  Lab 12/16/20 0529 12/06/2020 2316 12/18/20 0007 12/19/20 0326 12/20/20 0644  WBC 17.3* 14.0* 13.8* 12.8* 10.4  NEUTROABS  --  7.7  --   --   --   HGB 8.3* 8.0* 7.6* 7.2* 7.9*  HCT 24.5* 24.3* 24.0* 23.1* 25.5*  MCV 85.4 89.7 91.6 92.0 93.8  PLT 234 284 277 276 281   Basic Metabolic Panel: Recent Labs  Lab 12/15/20 0539 12/16/20 0529 12/25/2020 2316 12/18/20 0007 12/18/20 1536 12/19/20 0326 12/19/20 1822 12/20/20 0549 12/20/20 1037  NA 136  --  130* 130* 132* 133*  --  134*  --   K 5.6* 5.4* 6.3* 6.2* 5.7* 5.4* 5.1 5.6* 4.6  CL 108  --  104 105 104 104  --  107  --   CO2 19*  --  20* 20* 22 23  --  19*  --   GLUCOSE 116*  --  177* 166* 125* 100*  --  85  --   BUN 33*  --  50* 50* 55* 56*  --  58*  --   CREATININE 1.93*  --  2.28* 2.35* 2.42* 2.40*  --  2.64*  --   CALCIUM 8.9  --  9.4 9.5 9.6 9.6  --  8.9  --   MG 2.1 2.2  --  2.2  --  2.3  --  2.2  --   PHOS 2.8 3.7  --  4.2  --  4.6  --  4.6  --    GFR: Estimated Creatinine Clearance: 27.9 mL/min (A) (by C-G formula based on SCr of 2.64 mg/dL (H)). Liver Function Tests: Recent Labs  Lab 12/14/20 0715 12/25/20 2316 12/18/20 0007 12/19/20 0326  AST  --  103* 99* 40  ALT  --  74* 75* 55*  ALKPHOS  --  1,228* 1,272* 964*  BILITOT  --  1.0 1.0 1.1  PROT  --  5.7* 5.8* 5.9*  ALBUMIN 3.5 3.2* 3.2* 3.2*   No results for input(s): LIPASE, AMYLASE in the last 168 hours. No results for input(s): AMMONIA in the last 168 hours. Coagulation Profile: Recent Labs  Lab Dec 25, 2020 2316  INR 1.1   Cardiac Enzymes: No results for input(s): CKTOTAL, CKMB, CKMBINDEX, TROPONINI in the last 168 hours. BNP (last 3 results) No results for input(s): PROBNP in the last 8760 hours. HbA1C: No results for input(s): HGBA1C in the last 72 hours. CBG: Recent Labs  Lab 12/19/20 2221 12/20/20 0234 12/20/20 0444 12/20/20 0746 12/20/20 1132  GLUCAP 94 94 91 75 95   Lipid Profile: No results for  input(s): CHOL, HDL, LDLCALC, TRIG, CHOLHDL, LDLDIRECT in the last 72 hours. Thyroid Function Tests: Recent Labs    12/18/20 0007  TSH 8.220*  T4TOTAL 3.2*   Anemia Panel: No results for input(s): VITAMINB12, FOLATE, FERRITIN, TIBC, IRON, RETICCTPCT in the last 72 hours. Sepsis Labs: Recent Labs  Lab 12-25-2020 2316 12/18/20 0007 12/18/20 0527  LATICACIDVEN 1.1 1.1 1.3    Recent Results (from the past 240 hour(s))  Urine culture     Status: Abnormal   Collection Time: 12-25-20 11:07 PM   Specimen: Urine, Random  Result Value Ref Range Status   Specimen Description   Final    URINE, RANDOM Performed at [facility], [address] [place], [place] [address]    Special Requests   Final    NONE Performed at [facility], [address] [place], [place] [address]    Culture >=100,000 COLONIES/mL PSEUDOMONAS AERUGINOSA (A)  Final   Report Status 12/20/2020 FINAL  Final   Organism ID, Bacteria PSEUDOMONAS AERUGINOSA (A)  Final      Susceptibility   Pseudomonas aeruginosa - MIC*    CEFTAZIDIME 2 SENSITIVE Sensitive     CIPROFLOXACIN <=0.25 SENSITIVE Sensitive     GENTAMICIN <=1 SENSITIVE Sensitive     IMIPENEM 1 SENSITIVE Sensitive     PIP/TAZO <=4 SENSITIVE Sensitive     CEFEPIME 2 SENSITIVE Sensitive     * >=100,000 COLONIES/mL PSEUDOMONAS AERUGINOSA  Resp Panel by RT-PCR (Flu A&B, Covid) Nasopharyngeal Swab     Status: None   Collection Time: 2020/12/25 11:15 PM   Specimen: Nasopharyngeal Swab; Nasopharyngeal(NP) swabs in vial transport medium  Result Value Ref Range Status   SARS Coronavirus 2 by RT PCR NEGATIVE NEGATIVE Final    Comment: (NOTE) SARS-CoV-2 target nucleic acids are NOT DETECTED.  The SARS-CoV-2 RNA is generally detectable in upper respiratory specimens during the acute phase of infection. The lowest concentration of SARS-CoV-2 viral copies this assay can detect is 138 copies/mL. A negative result does not preclude  SARS-Cov-2 infection and should not be used as the sole basis for treatment or other patient management decisions. A negative result may occur with  improper specimen collection/handling, submission of specimen other than nasopharyngeal swab, presence of viral mutation(s) within the areas targeted by  this assay, and inadequate number of viral copies(<138 copies/mL). A negative result must be combined with clinical observations, patient history, and epidemiological information. The expected result is Negative.  Fact Sheet for Patients:  BloggerCourse.com  Fact Sheet for Healthcare Providers:  SeriousBroker.it  This test is no t yet approved or cleared by the [place] FDA and  has been authorized for detection and/or diagnosis of SARS-CoV-2 by FDA under an Emergency Use Authorization (EUA). This EUA will remain  in effect (meaning this test can be used) for the duration of the COVID-19 declaration under Section 564(b)(1) of the Act, 21 U.S.C.section 360bbb-3(b)(1), unless the authorization is terminated  or revoked sooner.       Influenza A by PCR NEGATIVE NEGATIVE Final   Influenza B by PCR NEGATIVE NEGATIVE Final    Comment: (NOTE) The Xpert Xpress SARS-CoV-2/FLU/RSV plus assay is intended as an aid in the diagnosis of influenza from Nasopharyngeal swab specimens and should not be used as a sole basis for treatment. Nasal washings and aspirates are unacceptable for Xpert Xpress SARS-CoV-2/FLU/RSV testing.  Fact Sheet for Patients: BloggerCourse.com  Fact Sheet for Healthcare Providers: SeriousBroker.it  This test is not yet approved or cleared by the [place] FDA and has been authorized for detection and/or diagnosis of SARS-CoV-2 by FDA under an Emergency Use Authorization (EUA). This EUA will remain in effect (meaning this test can be used) for the duration of  the COVID-19 declaration under Section 564(b)(1) of the Act, 21 U.S.C. section 360bbb-3(b)(1), unless the authorization is terminated or revoked.  Performed at [facility], [address] [place], [place] [address]   Culture, blood (Routine x 2)     Status: None (Preliminary result)   Collection Time: 12/26/2020 11:27 PM   Specimen: BLOOD  Result Value Ref Range Status   Specimen Description BLOOD BLOOD LEFT HAND  Final   Special Requests   Final    BOTTLES DRAWN AEROBIC AND ANAEROBIC Blood Culture results may not be optimal due to an inadequate volume of blood received in culture bottles   Culture   Final    NO GROWTH 2 DAYS Performed at [facility], [address], [place], [place] [address]    Report Status PENDING  Incomplete  Culture, blood (Routine x 2)     Status: None (Preliminary result)   Collection Time: 12/18/20 12:06 AM   Specimen: BLOOD  Result Value Ref Range Status   Specimen Description BLOOD BLOOD LEFT FOREARM  Final   Special Requests   Final    BOTTLES DRAWN AEROBIC AND ANAEROBIC Blood Culture adequate volume   Culture   Final    NO GROWTH 2 DAYS Performed at [facility], [address], [place], [place] [address]    Report Status PENDING  Incomplete  MRSA PCR Screening     Status: None   Collection Time: 12/18/20  8:52 AM   Specimen: Nasal Mucosa; Nasopharyngeal  Result Value Ref Range Status   MRSA by PCR NEGATIVE NEGATIVE Final    Comment:        The GeneXpert MRSA Assay (FDA approved for NASAL specimens only), is one component of a comprehensive MRSA colonization surveillance program. It is not intended to diagnose MRSA infection nor to guide or monitor treatment for MRSA infections. Performed at [facility], [address] [place], [place] [address]      Radiology Studies: DG Abd 1 View  Result Date: 12/18/2020 CLINICAL DATA:  Nasogastric tube placement EXAM:  ABDOMEN - 1 VIEW COMPARISON:  None  FINDINGS: Nasogastric tube tip and side port in stomach. Gastrostomy catheter positioned in stomach as well. No appreciable bowel dilatation or air-fluid level to suggest bowel obstruction. No free air evident on supine examination. 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 IMPRESSION: Nasogastric tube tip and side port in stomach. Gastrostomy positioned in stomach as well. No bowel obstruction or free air evident. Electronically Signed   By: Bretta BangWilliam  Woodruff III M.D.   On: 12/18/2020 15:06   DG Chest Port 1 View  Result Date: 12/20/2020 CLINICAL DATA:  Shortness of breath EXAM: PORTABLE CHEST 1 VIEW COMPARISON:  12/18/2020, CT 06/21/2020, radiograph 12/10/2020, 12/13/2020 FINDINGS: Left IJ central venous catheter tip directed to the patient's right, in the general region of SVC origin but potentially directed against lateral wall of SVC. Bilateral pleural effusions and worsening airspace disease in the right greater than left chest. Obscured cardiomediastinal silhouette. No pneumothorax. IMPRESSION: Bilateral pleural effusions and slight worsening of airspace disease in the right greater than left chest. Left IJ central venous catheter tip directed to patient's right in the general region of SVC though question directed towards the lateral wall. Electronically Signed   By: Jasmine PangKim  Fujinaga M.D.   On: 12/20/2020 02:02    Scheduled Meds: . amiodarone  200 mg Per Tube Daily  . vitamin C  250 mg Per Tube BID  . chlorhexidine  15 mL Mouth Rinse BID  . Chlorhexidine Gluconate Cloth  6 each Topical Daily  . DULoxetine  30 mg Oral Daily  . feeding supplement (PROSource TF)  45 mL Per Tube BID  . free water  120 mL Per Tube Q4H  . heparin  5,000 Units Subcutaneous Q8H  . mouth rinse  15 mL Mouth Rinse q12n4p  . metoCLOPramide  5 mg Per Tube TID AC  . mirtazapine  45 mg Oral QHS  . OLANZapine  5 mg Oral QHS  . pantoprazole (PROTONIX) IV  40 mg Intravenous QHS  . scopolamine  1 patch  Transdermal Q72H  . sodium zirconium cyclosilicate  10 g Per Tube Daily   Continuous Infusions: . sodium chloride 10 mL/hr at 12/20/20 1135  . [START ON 12/21/2020] ceFEPime (MAXIPIME) IV    . feeding supplement (NEPRO CARB STEADY) 1,000 mL (12/20/20 1228)     LOS: 2 days   Time spent: 45 minutes. More than 50% of the time was spent in counseling/coordination of care  Arnetha CourserSumayya Aliya Sol, MD [facility]  If 7PM-7AM, please contact night-coverage [email or web address]  12/20/2020, 2:31 PM   This record has been created using Conservation officer, historic buildingsDragon voice recognition software. Errors have been sought and corrected,but may not always be located. Such creation errors do not reflect on the standard of care.

## 2020-12-20 NOTE — Progress Notes (Signed)
[facility], [place] 12/20/20  Subjective:   LOS: 2 03/14 0701 - 03/15 0700 In: 751.2 [NG/GT:431.2; IV Piggyback:200] Out: 250 [Urine:250]  Patient known to our practice from previous admission and she was followed for AKI.  This time she presents to the emergency room from home due to altered mental status.  She was recently discharged from the hospital on March 11 after complicated stay for a creatinine, altered mental status, failure to thrive and atrial fibrillation with rapid ventricular response At present patient is fairly lethargic and not able to provide meaningful information. In the ER they were concerned about pneumonia and UTI based on findings of chest x-ray and urinalysis.  She was admitted for further evaluation and management. She was also noted to be hyperkalemic.  Potassium was treated with shifting measures  Patient seen resting in bed Daughter at bedside Alert, but drowsy Denies nausea Denies shortness of breath    Objective:  Vital signs in last 24 hours:  Temp:  [97.4 F (36.3 C)-98.5 F (36.9 C)] 98.5 F (36.9 C) (03/15 0757) Pulse Rate:  [86-100] 86 (03/15 0757) Resp:  [15-23] 18 (03/15 0757) BP: (112-142)/(61-115) 116/79 (03/15 0757) SpO2:  [93 %-100 %] 93 % (03/15 0757) Weight:  [125.3 kg] 125.3 kg (03/15 0443)  Weight change: -2.7 kg Filed Weights   12/18/20 0500 12/19/20 0500 12/20/20 0443  Weight: 124.3 kg 128 kg 125.3 kg    Intake/Output:    Intake/Output Summary (Last 24 hours) at 12/20/2020 1033 Last data filed at 12/20/2020 0000 Gross per 24 hour  Intake 646.17 ml  Output 250 ml  Net 396.17 ml     Physical Exam: General:  Lethargic, not able to provide any information  HEENT  mouth is dry  Pulm/lungs  [place] O2, normal breathing effort, mild crackles at bases  CVS/Heart  regular, tachycardic  Abdomen:   Soft, mildly distended  Extremities:  2+ dependent edema  Neurologic:  Lethargic  Skin:  Warm, dry            Basic Metabolic Panel:  Recent Labs  Lab 12/15/20 0539 12/16/20 0529 12/25/20 2316 12/18/20 0007 12/18/20 1536 12/19/20 0326 12/19/20 1822 12/20/20 0549  NA 136  --  130* 130* 132* 133*  --  134*  K 5.6* 5.4* 6.3* 6.2* 5.7* 5.4* 5.1 5.6*  CL 108  --  104 105 104 104  --  107  CO2 19*  --  20* 20* 22 23  --  19*  GLUCOSE 116*  --  177* 166* 125* 100*  --  85  BUN 33*  --  50* 50* 55* 56*  --  58*  CREATININE 1.93*  --  2.28* 2.35* 2.42* 2.40*  --  2.64*  CALCIUM 8.9  --  9.4 9.5 9.6 9.6  --  8.9  MG 2.1 2.2  --  2.2  --  2.3  --  2.2  PHOS 2.8 3.7  --  4.2  --  4.6  --  4.6     CBC: Recent Labs  Lab 12/16/20 0529 2020-12-25 2316 12/18/20 0007 12/19/20 0326 12/20/20 0644  WBC 17.3* 14.0* 13.8* 12.8* 10.4  NEUTROABS  --  7.7  --   --   --   HGB 8.3* 8.0* 7.6* 7.2* 7.9*  HCT 24.5* 24.3* 24.0* 23.1* 25.5*  MCV 85.4 89.7 91.6 92.0 93.8  PLT 234 284 277 276 281      Lab Results  Component Value Date   HEPBSAG NON  REACTIVE 06/13/2020   HEPBIGM NON REACTIVE 06/13/2020      Microbiology:  Recent Results (from the past 240 hour(s))  Urine culture     Status: Abnormal   Collection Time: 12/27/2020 11:07 PM   Specimen: Urine, Random  Result Value Ref Range Status   Specimen Description   Final    URINE, RANDOM Performed at [facility], [address] [place], [place] [address]    Special Requests   Final    NONE Performed at [facility], [address] [place], [place] [address]    Culture >=100,000 COLONIES/mL PSEUDOMONAS AERUGINOSA (A)  Final   Report Status 12/20/2020 FINAL  Final   Organism ID, Bacteria PSEUDOMONAS AERUGINOSA (A)  Final      Susceptibility   Pseudomonas aeruginosa - MIC*    CEFTAZIDIME 2 SENSITIVE Sensitive     CIPROFLOXACIN <=0.25 SENSITIVE Sensitive     GENTAMICIN <=1 SENSITIVE Sensitive     IMIPENEM 1 SENSITIVE Sensitive     PIP/TAZO <=4 SENSITIVE Sensitive     CEFEPIME 2 SENSITIVE Sensitive      * >=100,000 COLONIES/mL PSEUDOMONAS AERUGINOSA  Resp Panel by RT-PCR (Flu A&B, Covid) Nasopharyngeal Swab     Status: None   Collection Time: 12/08/2020 11:15 PM   Specimen: Nasopharyngeal Swab; Nasopharyngeal(NP) swabs in vial transport medium  Result Value Ref Range Status   SARS Coronavirus 2 by RT PCR NEGATIVE NEGATIVE Final    Comment: (NOTE) SARS-CoV-2 target nucleic acids are NOT DETECTED.  The SARS-CoV-2 RNA is generally detectable in upper respiratory specimens during the acute phase of infection. The lowest concentration of SARS-CoV-2 viral copies this assay can detect is 138 copies/mL. A negative result does not preclude SARS-Cov-2 infection and should not be used as the sole basis for treatment or other patient management decisions. A negative result may occur with  improper specimen collection/handling, submission of specimen other than nasopharyngeal swab, presence of viral mutation(s) within the areas targeted by this assay, and inadequate number of viral copies(<138 copies/mL). A negative result must be combined with clinical observations, patient history, and epidemiological information. The expected result is Negative.  Fact Sheet for Patients:  BloggerCourse.com  Fact Sheet for Healthcare Providers:  SeriousBroker.it  This test is no t yet approved or cleared by the [place] FDA and  has been authorized for detection and/or diagnosis of SARS-CoV-2 by FDA under an Emergency Use Authorization (EUA). This EUA will remain  in effect (meaning this test can be used) for the duration of the COVID-19 declaration under Section 564(b)(1) of the Act, 21 U.S.C.section 360bbb-3(b)(1), unless the authorization is terminated  or revoked sooner.       Influenza A by PCR NEGATIVE NEGATIVE Final   Influenza B by PCR NEGATIVE NEGATIVE Final    Comment: (NOTE) The Xpert Xpress SARS-CoV-2/FLU/RSV plus assay is intended as an  aid in the diagnosis of influenza from Nasopharyngeal swab specimens and should not be used as a sole basis for treatment. Nasal washings and aspirates are unacceptable for Xpert Xpress SARS-CoV-2/FLU/RSV testing.  Fact Sheet for Patients: BloggerCourse.com  Fact Sheet for Healthcare Providers: SeriousBroker.it  This test is not yet approved or cleared by the [place] FDA and has been authorized for detection and/or diagnosis of SARS-CoV-2 by FDA under an Emergency Use Authorization (EUA). This EUA will remain in effect (meaning this test can be used) for the duration of the COVID-19 declaration under Section 564(b)(1) of the Act, 21 U.S.C. section 360bbb-3(b)(1), unless the  authorization is terminated or revoked.  Performed at [facility], [address] [place], [place] [address]   Culture, blood (Routine x 2)     Status: None (Preliminary result)   Collection Time: Jun 26, 2021 11:27 PM   Specimen: BLOOD  Result Value Ref Range Status   Specimen Description BLOOD BLOOD LEFT HAND  Final   Special Requests   Final    BOTTLES DRAWN AEROBIC AND ANAEROBIC Blood Culture results may not be optimal due to an inadequate volume of blood received in culture bottles   Culture   Final    NO GROWTH 2 DAYS Performed at [facility], [address] [place], [place] [address]    Report Status PENDING  Incomplete  Culture, blood (Routine x 2)     Status: None (Preliminary result)   Collection Time: 12/18/20 12:06 AM   Specimen: BLOOD  Result Value Ref Range Status   Specimen Description BLOOD BLOOD LEFT FOREARM  Final   Special Requests   Final    BOTTLES DRAWN AEROBIC AND ANAEROBIC Blood Culture adequate volume   Culture   Final    NO GROWTH 2 DAYS Performed at [facility], [address] [place], [place] [address]    Report Status PENDING  Incomplete  MRSA PCR Screening     Status: None    Collection Time: 12/18/20  8:52 AM   Specimen: Nasal Mucosa; Nasopharyngeal  Result Value Ref Range Status   MRSA by PCR NEGATIVE NEGATIVE Final    Comment:        The GeneXpert MRSA Assay (FDA approved for NASAL specimens only), is one component of a comprehensive MRSA colonization surveillance program. It is not intended to diagnose MRSA infection nor to guide or monitor treatment for MRSA infections. Performed at [facility], [address] [place], [place] [address]     Coagulation Studies: Recent Labs    Jun 26, 2021 2316  LABPROT 13.8  INR 1.1    Urinalysis: Recent Labs    Jun 26, 2021 2307  COLORURINE AMBER*  LABSPEC 1.013  PHURINE 5.0  GLUCOSEU NEGATIVE  HGBUR MODERATE*  BILIRUBINUR NEGATIVE  KETONESUR NEGATIVE  PROTEINUR 100*  NITRITE NEGATIVE  LEUKOCYTESUR LARGE*      Imaging: DG Abd 1 View  Result Date: 12/18/2020 CLINICAL DATA:  Nasogastric tube placement EXAM: ABDOMEN - 1 VIEW COMPARISON:  None FINDINGS: Nasogastric tube tip and side port in stomach. Gastrostomy catheter positioned in stomach as well. No appreciable bowel dilatation or air-fluid level to suggest bowel obstruction. No free air evident on supine examination. 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 IMPRESSION: Nasogastric tube tip and side port in stomach. Gastrostomy positioned in stomach as well. No bowel obstruction or free air evident. Electronically Signed   By: Bretta BangWilliam  Woodruff III M.D.   On: 12/18/2020 15:06   DG Chest Port 1 View  Result Date: 12/20/2020 CLINICAL DATA:  Shortness of breath EXAM: PORTABLE CHEST 1 VIEW COMPARISON:  12/18/2020, CT 06/21/2020, radiograph 2021-10-02, 12/13/2020 FINDINGS: Left IJ central venous catheter tip directed to the patient's right, in the general region of SVC origin but potentially directed against lateral wall of SVC. Bilateral pleural effusions and worsening airspace disease in the right greater than left chest. Obscured  cardiomediastinal silhouette. No pneumothorax. IMPRESSION: Bilateral pleural effusions and slight worsening of airspace disease in the right greater than left chest. Left IJ central venous catheter tip directed to patient's right in the general region of SVC though question directed towards the lateral  wall. Electronically Signed   By: Jasmine Pang M.D.   On: 12/20/2020 02:02     Medications:   . ceFEPime (MAXIPIME) IV Stopped (12/19/20 2236)  . feeding supplement (NEPRO CARB STEADY) 1,000 mL (12/19/20 1639)   . amiodarone  200 mg Per Tube Daily  . vitamin C  250 mg Per Tube BID  . chlorhexidine  15 mL Mouth Rinse BID  . Chlorhexidine Gluconate Cloth  6 each Topical Daily  . DULoxetine  30 mg Oral Daily  . feeding supplement (PROSource TF)  45 mL Per Tube BID  . free water  120 mL Per Tube Q4H  . heparin  5,000 Units Subcutaneous Q8H  . mouth rinse  15 mL Mouth Rinse q12n4p  . metoCLOPramide  5 mg Per Tube TID AC  . mirtazapine  45 mg Oral QHS  . OLANZapine  5 mg Oral QHS  . pantoprazole (PROTONIX) IV  40 mg Intravenous QHS  . sodium zirconium cyclosilicate  10 g Per Tube Daily   albuterol, docusate sodium, polyethylene glycol  Assessment/ Plan:  65 y.o. female with diabetes mellitus type II, hypertension, osteoarthritis, hyperlipidemia, aortic atherosclerosis who was admitted to [facility] on 12/10/2020   Active Problems:   Altered mental status   #. AKI - Baseline Creatinine of 0.95 with normal GFR >60 on 12/10/13/2019.  Recent Labs    12/18/20 0007 12/18/20 1536 12/19/20 0326 12/20/20 0549  CREATININE 2.35* 2.42* 2.40* 2.64*   Pertinent studies: Imaging: CT abdomen without contrast on December 03, 2020.  No evidence of obstructive uropathy or nephrolithiasis. Urinalysis: 12/06/2020: Moderate hemoglobin, large leukocyte, protein 100, greater than 50 RBCs greater than 50 WBCs Serum creatinine is slowly worsening since early March.  Creatinine of 1.05 on December 07, 2020 Differential diagnosis includes interstitial nephritis vs prolonged ATN Increased creatinine to 2.6 Will monitor and offer conservative management, if needed No acute need for dialysis at this time  #Generalized edema - Likely third spacing from low albumin Supportive care for now   #.  Hyperkalemia Potassium level of 6.3 at admission Treated with shifting measures Agree with continuing [place] Lab Results  Component Value Date   K 5.6 (H) 12/20/2020   redraw level-4.6  #Urinary tract infection Urine culture from 12/12/2020 is growing greater than 100,000 Pseudomonas aeruginosa Currently being treated with cefepime and vancomycin    LOS: 2 Wendee Beavers 3/15/202210:33 AM  [facility] [place], [place] [phone]

## 2020-12-20 NOTE — Progress Notes (Signed)
Pharmacy Antibiotic Note  Chelsea Glass is a 65 y.o. female admitted on December 27, 2020 with HCAP.  Pharmacy has been consulted for Cefepime dosing. CXR:  "Diffuse patchy airspace opacities suggestive of infection/inflammation with associated bilateral trace to small view volume pleural effusions."  Today, 12/20/2020 Day #3 cefepime  Renal: scr trending up  WBC improving  Urine: pan-sensitive pseudomonas  CXR: BL pleural effusions  Plan:  Based on worsening renal function, adjust cefepime to 2gm IV q24h  Weight: 125.3 kg (276 lb 3.8 oz)  Temp (24hrs), Avg:98 F (36.7 C), Min:97.4 F (36.3 C), Max:98.5 F (36.9 C)  Recent Labs  Lab 12/16/20 0529 12-27-20 2316 12/18/20 0007 12/18/20 0527 12/18/20 1536 12/19/20 0326 12/20/20 0549 12/20/20 0644  WBC 17.3* 14.0* 13.8*  --   --  12.8*  --  10.4  CREATININE  --  2.28* 2.35*  --  2.42* 2.40* 2.64*  --   LATICACIDVEN  --  1.1 1.1 1.3  --   --   --   --     Estimated Creatinine Clearance: 27.9 mL/min (A) (by C-G formula based on SCr of 2.64 mg/dL (H)).    Allergies  Allergen Reactions  . Lisinopril Swelling    Antimicrobials this admission: 03/13 Vanc >> LD 2500 mg x 1 03/13 Cefepime >>   Microbiology results: 03/12 BCx: NGTD 0./12 UCx: P aeruginosa   Thank you for allowing pharmacy to be a part of this patient's care.  Juliette Alcide, PharmD, BCPS.   Work Cell: [phone] 12/20/2020 12:00 PM

## 2020-12-21 DIAGNOSIS — Z515 Encounter for palliative care: Secondary | ICD-10-CM | POA: Diagnosis not present

## 2020-12-21 DIAGNOSIS — Z7189 Other specified counseling: Secondary | ICD-10-CM | POA: Diagnosis not present

## 2020-12-21 LAB — GLUCOSE, CAPILLARY
Glucose-Capillary: 143 mg/dL — ABNORMAL HIGH (ref 70–99)
Glucose-Capillary: 162 mg/dL — ABNORMAL HIGH (ref 70–99)
Glucose-Capillary: 146 mg/dL — ABNORMAL HIGH (ref 70–99)
Glucose-Capillary: 134 mg/dL — ABNORMAL HIGH (ref 70–99)
Glucose-Capillary: 172 mg/dL — ABNORMAL HIGH (ref 70–99)
Glucose-Capillary: 170 mg/dL — ABNORMAL HIGH (ref 70–99)

## 2020-12-21 LAB — BASIC METABOLIC PANEL
Anion gap: 9 (ref 5–15)
BUN: 62 mg/dL — ABNORMAL HIGH (ref 8–23)
CO2: 19 mmol/L — ABNORMAL LOW (ref 22–32)
Calcium: 9.4 mg/dL (ref 8.9–10.3)
Chloride: 105 mmol/L (ref 98–111)
Creatinine, Ser: 2.78 mg/dL — ABNORMAL HIGH (ref 0.44–1.00)
GFR, Estimated: 18 mL/min — ABNORMAL LOW (ref >60)
Glucose, Bld: 167 mg/dL — ABNORMAL HIGH (ref 70–99)
Potassium: 4.4 mmol/L (ref 3.5–5.1)
Sodium: 133 mmol/L — ABNORMAL LOW (ref 135–145)

## 2020-12-21 LAB — CBC
HCT: 21.9 % — ABNORMAL LOW (ref 36.0–46.0)
Hemoglobin: 6.4 g/dL — ABNORMAL LOW (ref 12.0–15.0)
MCH: 29.2 pg (ref 26.0–34.0)
MCHC: 29.2 g/dL — ABNORMAL LOW (ref 30.0–36.0)
MCV: 100 fL (ref 80.0–100.0)
Platelets: 170 10*3/uL (ref 150–400)
RBC: 2.19 MIL/uL — ABNORMAL LOW (ref 3.87–5.11)
RDW: 24 % — ABNORMAL HIGH (ref 11.5–15.5)
WBC: 8.7 10*3/uL (ref 4.0–10.5)
nRBC: 4.3 % — ABNORMAL HIGH (ref 0.0–0.2)

## 2020-12-21 LAB — PREPARE RBC (CROSSMATCH)

## 2020-12-21 MED ORDER — SODIUM CHLORIDE 0.9% IV SOLUTION: Freq: Once | INTRAVENOUS | Status: AC

## 2020-12-21 NOTE — Progress Notes (Signed)
°   12/21/20 0500  Assess: if the MEWS score is Yellow or Red  Were vital signs taken at a resting state? Yes  Focused Assessment Change from prior assessment (see assessment flowsheet)  Early Detection of Sepsis Score *See Row Information* Medium  MEWS guidelines implemented *See Row Information* No, altered LOC is baseline  Notify: Charge Nurse/RN  Name of Charge Nurse/RN Notified Kaira, RN  Date Charge Nurse/RN Notified 12/21/20  Time Charge Nurse/RN Notified 0507  Document  Progress note created (see row info) Yes

## 2020-12-21 NOTE — Progress Notes (Signed)
   12/21/20 0500  Assess: if the MEWS score is Yellow or Red  Were vital signs taken at a resting state? Yes  Focused Assessment Change from prior assessment (see assessment flowsheet)  Early Detection of Sepsis Score *See Row Information* Medium  MEWS guidelines implemented *See Row Information* No, altered LOC is baseline  Treat  MEWS Interventions Other (Comment) (Altered loc patient possibly becoming comfort care)  Notify: Charge Nurse/RN  Name of Charge Nurse/RN Notified Kaira, RN  Date Charge Nurse/RN Notified 12/21/20  Time Charge Nurse/RN Notified 0507  Document  Patient Outcome Other (Comment) (Will continue to monitor)  Progress note created (see row info) Yes

## 2020-12-21 NOTE — Progress Notes (Signed)
[facility] PROGRESS NOTE    Chelsea Glass  SAY:[id number] DOB: 13-Mar-1956 DOA: 12/07/2020 PCP: [facility] [employer]      Brief Narrative:  Chelsea Glass is a 65 y.o. F with HTN, Afib, Diabetes and obesity as well as COVID last Sep with declining health since then including recent hospitalization for encephalopathy due to failure to thrive, requiring PEG placement, who was brought to the hospital for several days again progressive somnolence and decreased mentation.  In the ER, K 6.3, Cr 2.3, LFTs elevated, ABG showeing hP 7.22 with PCo2 53 and CXR showing pneumonia.  Admitted to ICU and started on empiric antibiotics.      Assessment & Plan:  Acute metabolic encephalopathy Delirium -Continue olanzapine  Hyperkalemia Resolved with treatment  Sepsis -Continue cefepime  Acute respiratory failure Resolved  Atrial fibrillation, paroxysmal -Continue amiodarone  Anemia Hgb down to 6.4 today.  -Transfuse one unit  Hyponatremia Stable  Acute renal failure Worsning despite fluids  Failure to thrive COVID last Sep, has been declining since then. S/P PEG placemetn -Continue tube feeds, Reglan, PPI  Hypertension -Hold home metoprolol  Diabetes Glucoses normal here -Hold home antidiabetics  Pineal gland mass  Noncontributory  Morbid obesity  BMI 45  Pressure injury buttoicks, stage II POA Pressure injury L elbow, stage II POA  Other medications -Continue Duloxetine, mirtazpaine          Disposition: Status is: Inpatient  Remains inpatient appropriate because:actively dying   Dispo: The patient is from: Home              Anticipated d/c is to: Home or inpatient hospice              Patient currently is not medically stable to d/c.   Difficult to place patient No       Level of care: Med-Surg       MDM: The below labs and imaging reports were reviewed and summarized above.  Medication management as  above.    DVT prophylaxis: heparin injection 5,000 Units Start: 12/18/20 0045 SCDs Start: 12/18/20 0036  Code Status: DNR Family Communication: daughter at bedside             Subjective: Worsening mentation overnight.  Now somnolent.  No fever.  Sluggish.  No agitation.  No hemorrhage.  Objective: Vitals:   12/21/20 0818 12/21/20 1222 12/21/20 1541 12/21/20 1616  BP: 120/72 137/73 115/61 115/66  Pulse: (!) 109 (!) 101 (!) 103   Resp: (!) 22 20 20 20   Temp: 98.3 F (36.8 C) 98.1 F (36.7 C) 98.8 F (37.1 C) 98.8 F (37.1 C)  TempSrc: Oral Axillary Oral Axillary  SpO2: 100% 91% 98% 99%  Weight:        Intake/Output Summary (Last 24 hours) at 12/21/2020 1746 Last data filed at 12/21/2020 0658 Gross per 24 hour  Intake --  Output 300 ml  Net -300 ml   Filed Weights   12/18/20 0500 12/19/20 0500 12/20/20 0443  Weight: 124.3 kg 128 kg 125.3 kg    Examination: General appearance: elderly adult female,obtunded, lying in bed, mouth open, in no obvious pain or distress HEENT:   Skin: Warm and dry.  no jaundice.  No suspicious rashes or lesions. Cardiac: RRR, nl S1-S2, no murmurs appreciated.  Capillary refill is brisk.  JVP normal.  No LE edema.  Radial pulses 2+ and symmetric. Respiratory: Snoring rhythm.  Scopolamine reduces secretions.  No rales or wheezing.. Abdomen: Abdomen soft.  No grimace  to palpation.  No rigidity  No ascites, distension, hepatosplenomegaly.   MSK: No deformities or effusions. Neuro: Somnolent, does not rouse to touch, no spontaneous movements or verbalizations.    Psych: Obtunded.    Data Reviewed: I have personally reviewed following labs and imaging studies:  CBC: Recent Labs  Lab 12/13/2020 2316 12/18/20 0007 12/19/20 0326 12/20/20 0644 12/21/20 0545  WBC 14.0* 13.8* 12.8* 10.4 8.7  NEUTROABS 7.7  --   --   --   --   HGB 8.0* 7.6* 7.2* 7.9* 6.4*  HCT 24.3* 24.0* 23.1* 25.5* 21.9*  MCV 89.7 91.6 92.0 93.8 100.0  PLT 284 277  276 281 170   Basic Metabolic Panel: Recent Labs  Lab 12/15/20 0539 12/16/20 0529 12/14/2020 2316 12/18/20 0007 12/18/20 1536 12/19/20 0326 12/19/20 1822 12/20/20 0549 12/20/20 1037 12/21/20 0545  NA 136  --    < > 130* 132* 133*  --  134*  --  133*  K 5.6* 5.4*   < > 6.2* 5.7* 5.4* 5.1 5.6* 4.6 4.4  CL 108  --    < > 105 104 104  --  107  --  105  CO2 19*  --    < > 20* 22 23  --  19*  --  19*  GLUCOSE 116*  --    < > 166* 125* 100*  --  85  --  167*  BUN 33*  --    < > 50* 55* 56*  --  58*  --  62*  CREATININE 1.93*  --    < > 2.35* 2.42* 2.40*  --  2.64*  --  2.78*  CALCIUM 8.9  --    < > 9.5 9.6 9.6  --  8.9  --  9.4  MG 2.1 2.2  --  2.2  --  2.3  --  2.2  --   --   PHOS 2.8 3.7  --  4.2  --  4.6  --  4.6  --   --    < > = values in this interval not displayed.   GFR: Estimated Creatinine Clearance: 26.5 mL/min (A) (by C-G formula based on SCr of 2.78 mg/dL (H)). Liver Function Tests: Recent Labs  Lab 12/20/2020 2316 12/18/20 0007 12/19/20 0326  AST 103* 99* 40  ALT 74* 75* 55*  ALKPHOS 1,228* 1,272* 964*  BILITOT 1.0 1.0 1.1  PROT 5.7* 5.8* 5.9*  ALBUMIN 3.2* 3.2* 3.2*   No results for input(s): LIPASE, AMYLASE in the last 168 hours. No results for input(s): AMMONIA in the last 168 hours. Coagulation Profile: Recent Labs  Lab 12/21/2020 2316  INR 1.1   Cardiac Enzymes: No results for input(s): CKTOTAL, CKMB, CKMBINDEX, TROPONINI in the last 168 hours. BNP (last 3 results) No results for input(s): PROBNP in the last 8760 hours. HbA1C: No results for input(s): HGBA1C in the last 72 hours. CBG: Recent Labs  Lab 12/21/20 0109 12/21/20 0651 12/21/20 0746 12/21/20 1219 12/21/20 1641  GLUCAP 143* 162* 146* 134* 172*   Lipid Profile: No results for input(s): CHOL, HDL, LDLCALC, TRIG, CHOLHDL, LDLDIRECT in the last 72 hours. Thyroid Function Tests: No results for input(s): TSH, T4TOTAL, FREET4, T3FREE, THYROIDAB in the last 72 hours. Anemia Panel: No  results for input(s): VITAMINB12, FOLATE, FERRITIN, TIBC, IRON, RETICCTPCT in the last 72 hours. Urine analysis:    Component Value Date/Time   COLORURINE AMBER (A) 12/08/2020 2307   APPEARANCEUR TURBID (A) 12/21/2020 2307   LABSPEC  1.013 01-01-2021 2307   PHURINE 5.0 2021-01-01 2307   GLUCOSEU NEGATIVE 01-Jan-2021 2307   HGBUR MODERATE (A) January 01, 2021 2307   BILIRUBINUR NEGATIVE 01/01/2021 2307   KETONESUR NEGATIVE 01/01/21 2307   PROTEINUR 100 (A) 01-Jan-2021 2307   NITRITE NEGATIVE Jan 01, 2021 2307   LEUKOCYTESUR LARGE (A) 2021-01-01 2307   Sepsis Labs: @LABRCNTIP (procalcitonin:4,lacticacidven:4)  ) Recent Results (from the past 240 hour(s))  Urine culture     Status: Abnormal   Collection Time: 01-01-21 11:07 PM   Specimen: Urine, Random  Result Value Ref Range Status   Specimen Description   Final    URINE, RANDOM Performed at [facility], [address] [place], [place]    Special Requests   Final    NONE Performed at [facility], [address] [place], [place]    Culture >=100,000 COLONIES/mL PSEUDOMONAS AERUGINOSA (A)  Final   Report Status 12/20/2020 FINAL  Final   Organism ID, Bacteria PSEUDOMONAS AERUGINOSA (A)  Final      Susceptibility   Pseudomonas aeruginosa - MIC*    CEFTAZIDIME 2 SENSITIVE Sensitive     CIPROFLOXACIN <=0.25 SENSITIVE Sensitive     GENTAMICIN <=1 SENSITIVE Sensitive     IMIPENEM 1 SENSITIVE Sensitive     PIP/TAZO <=4 SENSITIVE Sensitive     CEFEPIME 2 SENSITIVE Sensitive     * >=100,000 COLONIES/mL PSEUDOMONAS AERUGINOSA  Resp Panel by RT-PCR (Flu A&B, Covid) Nasopharyngeal Swab     Status: None   Collection Time: 01-Jan-2021 11:15 PM   Specimen: Nasopharyngeal Swab; Nasopharyngeal(NP) swabs in vial transport medium  Result Value Ref Range Status   SARS Coronavirus 2 by RT PCR NEGATIVE NEGATIVE Final    Comment: (NOTE) SARS-CoV-2 target nucleic acids are NOT DETECTED.  The SARS-CoV-2 RNA is  generally detectable in upper respiratory specimens during the acute phase of infection. The lowest concentration of SARS-CoV-2 viral copies this assay can detect is 138 copies/mL. A negative result does not preclude SARS-Cov-2 infection and should not be used as the sole basis for treatment or other patient management decisions. A negative result may occur with  improper specimen collection/handling, submission of specimen other than nasopharyngeal swab, presence of viral mutation(s) within the areas targeted by this assay, and inadequate number of viral copies(<138 copies/mL). A negative result must be combined with clinical observations, patient history, and epidemiological information. The expected result is Negative.  Fact Sheet for Patients:  02/16/21  Fact Sheet for Healthcare Providers:  BloggerCourse.com  This test is no t yet approved or cleared by the [employer]ious[employer] FDA and  has been authorized for detection and/or diagnosis of SARS-CoV-2 by FDA under an Emergency Use Authorization (EUA). This EUA will remain  in effect (meaning this test can be used) for the duration of the COVID-19 declaration under Section 564(b)(1) of the Act, 21 U.S.C.section 360bbb-3(b)(1), unless the authorization is terminated  or revoked sooner.       Influenza A by PCR NEGATIVE NEGATIVE Final   Influenza B by PCR NEGATIVE NEGATIVE Final    Comment: (NOTE) The Xpert Xpress SARS-CoV-2/FLU/RSV plus assay is intended as an aid in the diagnosis of influenza from Nasopharyngeal swab specimens and should not be used as a sole basis for treatment. Nasal washings and aspirates are unacceptable for Xpert Xpress SARS-CoV-2/FLU/RSV testing.  Fact Sheet for Patients: [place]  Fact Sheet for Healthcare Providers: BloggerCourse.com  This test is not yet approved or cleared by the [employer]ious[employer]ker.it FDA and has been  authorized for detection and/or diagnosis of SARS-CoV-2 by FDA under an Emergency Use Authorization (EUA). This EUA will remain in effect (meaning this test can be used) for the duration of the COVID-19 declaration under Section 564(b)(1) of the Act, 21 U.S.C. section 360bbb-3(b)(1), unless the authorization is terminated or revoked.  Performed at [facility], [address] [place], [place] [address]   Culture, blood (Routine x 2)     Status: None (Preliminary result)   Collection Time: 12/14/2020 11:27 PM   Specimen: BLOOD  Result Value Ref Range Status   Specimen Description BLOOD BLOOD LEFT HAND  Final   Special Requests   Final    BOTTLES DRAWN AEROBIC AND ANAEROBIC Blood Culture results may not be optimal due to an inadequate volume of blood received in culture bottles   Culture   Final    NO GROWTH 3 DAYS Performed at [facility], [address] [place], [place] [address]    Report Status PENDING  Incomplete  Culture, blood (Routine x 2)     Status: None (Preliminary result)   Collection Time: 12/18/20 12:06 AM   Specimen: BLOOD  Result Value Ref Range Status   Specimen Description BLOOD BLOOD LEFT FOREARM  Final   Special Requests   Final    BOTTLES DRAWN AEROBIC AND ANAEROBIC Blood Culture adequate volume   Culture   Final    NO GROWTH 3 DAYS Performed at [facility], [address] [place], [place] [address]    Report Status PENDING  Incomplete  MRSA PCR Screening     Status: None   Collection Time: 12/18/20  8:52 AM   Specimen: Nasal Mucosa; Nasopharyngeal  Result Value Ref Range Status   MRSA by PCR NEGATIVE NEGATIVE Final    Comment:        The GeneXpert MRSA Assay (FDA approved for NASAL specimens only), is one component of a comprehensive MRSA colonization surveillance program. It is not intended to diagnose MRSA infection nor to guide or monitor treatment for MRSA infections. Performed at  [facility], [address], [place], [place] [address]          Radiology Studies: DG Chest [place] 1 View  Result Date: 12/20/2020 CLINICAL DATA:  Shortness of breath EXAM: PORTABLE CHEST 1 VIEW COMPARISON:  12/18/2020, CT 06/21/2020, radiograph 12/11/2020, 12/13/2020 FINDINGS: Left IJ central venous catheter tip directed to the patient's right, in the general region of SVC origin but potentially directed against lateral wall of SVC. Bilateral pleural effusions and worsening airspace disease in the right greater than left chest. Obscured cardiomediastinal silhouette. No pneumothorax. IMPRESSION: Bilateral pleural effusions and slight worsening of airspace disease in the right greater than left chest. Left IJ central venous catheter tip directed to patient's right in the general region of SVC though question directed towards the lateral wall. Electronically Signed   By: Jasmine Pang M.D.   On: 12/20/2020 02:02        Scheduled Meds: . amiodarone  200 mg Per Tube Daily  . vitamin C  250 mg Per Tube BID  . chlorhexidine  15 mL Mouth Rinse BID  . Chlorhexidine Gluconate Cloth  6 each Topical Daily  . DULoxetine  30 mg Oral Daily  . feeding supplement (PROSource TF)  45 mL Per Tube BID  . free water  120 mL Per Tube Q4H  . heparin  5,000 Units Subcutaneous Q8H  . mouth rinse  15 mL Mouth Rinse q12n4p  . metoCLOPramide  5 mg Per Tube TID AC  . mirtazapine  45 mg Oral QHS  . pantoprazole (PROTONIX) IV  40 mg Intravenous QHS  . scopolamine  1 patch Transdermal Q72H   Continuous Infusions: . sodium chloride Stopped (12/20/20 1217)  . ceFEPime (MAXIPIME) IV 2 g (12/21/20 1308)  . feeding supplement (NEPRO CARB STEADY) 1,000 mL (12/21/20 1057)     LOS: 3 days    Time spent: 35 minutes    Alberteen Sam, MD [facility] 12/21/2020, 5:46 PM     Please page though AMION or Epic secure chat:  For [employer], Higher education careers adviser

## 2020-12-21 NOTE — Progress Notes (Signed)
Daily Progress Note   Patient Name: Chelsea Glass       Date: 12/21/2020 DOB: [birth year]  Age: 65 y.o. MRN#: [id number] Attending Physician: Alberteen Sam, * Primary Glass Physician: [facility] Admit Date: 12/25/2020  Reason for Consultation/Follow-up: Establishing goals of Glass  Subjective: Patient is resting in bed and does not respond to me; daughter is at bedside. Upper airway gurgling no longer noted as patient has a scopolamine patch in place. Primary MD in to speak with her. She was updated and short term Glass discussed.   Spoke with daughter regarding her mother's status. Discussed prognosis of days to weeks. She discusses the circumstances around when her father died. She states Ms. Chelsea Glass had Glass provided to try to correct anything possible, and then shifted to comfort when nothing more could be done. We discussed multiple scenarios. She would like to continue to treat the treatable for the next few days to see if she shows any improvement, and if not, she will be ready to shift to comfort Glass. We discussed concerns for suffering, and the signs of suffering.         Length of Stay: 3  Current Medications: Scheduled Meds:  . sodium chloride   Intravenous Once  . amiodarone  200 mg Per Tube Daily  . vitamin C  250 mg Per Tube BID  . chlorhexidine  15 mL Mouth Rinse BID  . Chlorhexidine Gluconate Cloth  6 each Topical Daily  . DULoxetine  30 mg Oral Daily  . feeding supplement (PROSource TF)  45 mL Per Tube BID  . free water  120 mL Per Tube Q4H  . heparin  5,000 Units Subcutaneous Q8H  . mouth rinse  15 mL Mouth Rinse q12n4p  . metoCLOPramide  5 mg Per Tube TID AC  . mirtazapine  45 mg Oral QHS  . pantoprazole (PROTONIX) IV  40 mg Intravenous  QHS  . scopolamine  1 patch Transdermal Q72H    Continuous Infusions: . sodium chloride Stopped (12/20/20 1217)  . ceFEPime (MAXIPIME) IV    . feeding supplement (NEPRO CARB STEADY) 1,000 mL (12/21/20 1057)    PRN Meds: sodium chloride, albuterol, docusate sodium, polyethylene glycol  Physical Exam Constitutional:      Comments: Not responsive.   Pulmonary:     Comments:  Regular rhythm.             Vital Signs: BP 120/72 (BP Location: Right Arm)   Pulse (!) 109   Temp 98.3 F (36.8 C) (Oral)   Resp (!) 22   Wt 125.3 kg   SpO2 100%   BMI 45.97 kg/m  SpO2: SpO2: 100 % O2 Device: O2 Device: Nasal Cannula O2 Flow Rate: O2 Flow Rate (L/min): 2 L/min  Intake/output summary:   Intake/Output Summary (Last 24 hours) at 12/21/2020 1145 Last data filed at 12/21/2020 3976 Gross per 24 hour  Intake 176.89 ml  Output 700 ml  Net -523.11 ml   LBM: Last BM Date: 12/20/20 Baseline Weight: Weight: 124.3 kg Most recent weight: Weight: 125.3 kg       Palliative Assessment/Data:      Patient Active Problem List   Diagnosis Date Noted  . Lower urinary tract infectious disease   . Pneumonia due to infectious organism   . Altered mental status 12/18/2020  . Pressure injury of skin 12/16/2020  . Tachycardia   . Weight loss   . Adult failure to thrive   . Depression   . Acute kidney failure, unspecified (HCC) 12/03/2020  . Postmenopausal bleeding   . Hypertension   . Acute respiratory failure with hypoxia (HCC) 06/13/2020  . Obesity, Class III, BMI 40-49.9 (morbid obesity) (HCC) 06/13/2020  . Severe sepsis (HCC) 06/13/2020  . Atrial fibrillation (HCC) 06/13/2020  . Pneumonia due to COVID-19 virus 06/12/2020    Palliative Glass Assessment & Plan    Recommendations/Plan: Continue current Glass for the next few days. DNR/DNI.     Code Status:    Code Status Orders  (From admission, onward)         Start     Ordered   12/20/20 1549  Do not attempt  resuscitation (DNR)  Continuous       Question Answer Comment  In the event of cardiac or respiratory ARREST Do not call a "code blue"   In the event of cardiac or respiratory ARREST Do not perform Intubation, CPR, defibrillation or ACLS   In the event of cardiac or respiratory ARREST Use medication by any route, position, wound Glass, and other measures to relive pain and suffering. May use oxygen, suction and manual treatment of airway obstruction as needed for comfort.   Comments MOST form on chart.      12/20/20 1548        Code Status History    Date Active Date Inactive Code Status Order ID Comments User Context   12/18/2020 0040 12/20/2020 1548 Full Code [id number]  Rust-Chester, Cecelia Byars, NP ED   12/09/2020 1206 12/30/2020 0237 Full Code [id number]  Chelsea Glass Inpatient   12/03/2020 2042 12/09/2020 1206 Full Code [id number]  Chelsea Glass Chelsea Honey, MD Inpatient   06/12/2020 1804 06/27/2020 2002 Full Code [id number]  Chelsea Shadow, MD ED   Advance Glass Planning Activity      Prognosis:  < 2 weeks    Glass plan was discussed with primary MD.   Thank you for allowing the Palliative Medicine Team to assist in the Glass of this patient.   Total Time 35 min Prolonged Time Billed  no       Greater than 50%  of this time was spent counseling and coordinating Glass related to the above assessment and plan.  Morton Stall, NP  Please contact Palliative Medicine Team phone at [phone] for questions and concerns.

## 2020-12-21 NOTE — Progress Notes (Signed)
[facility], [place] 12/21/20  Subjective:   LOS: 3 03/15 0701 - 03/16 0700 In: 176.9 [I.V.:1.9; NG/GT:175] Out: 700 [Urine:400; Drains:300]  Patient known to our practice from previous admission and she was followed for AKI.  This time she presents to the emergency room from home due to altered mental status.  She was recently discharged from the hospital on March 11 after complicated stay for a creatinine, altered mental status, failure to thrive and atrial fibrillation with rapid ventricular response  In the ER there were concerns about pneumonia and UTI based on findings of chest x-ray and urinalysis.  She was admitted for further evaluation and management. She was also noted to be hyperkalemic.  Potassium was treated with shifting measures  Patient seen resting in bed Daughter at bedside Poorly responsive today.  Did not wake up to voice or tactile stimulation    Objective:  Vital signs in last 24 hours:  Temp:  [97.5 F (36.4 C)-98.3 F (36.8 C)] 98.1 F (36.7 C) (03/16 1222) Pulse Rate:  [97-110] 101 (03/16 1222) Resp:  [14-22] 20 (03/16 1222) BP: (120-145)/(70-84) 137/73 (03/16 1222) SpO2:  [91 %-100 %] 91 % (03/16 1222)  Weight change:  Filed Weights   12/18/20 0500 12/19/20 0500 12/20/20 0443  Weight: 124.3 kg 128 kg 125.3 kg    Intake/Output:    Intake/Output Summary (Last 24 hours) at 12/21/2020 1236 Last data filed at 12/21/2020 0658 Gross per 24 hour  Intake 176.89 ml  Output 700 ml  Net -523.11 ml     Physical Exam: General:  Lethargic, not able to provide any information  HEENT  mouth is dry  Pulm/lungs  Ida O2, normal breathing effort, mild crackles at bases  CVS/Heart  regular, tachycardic  Abdomen:   Soft, mildly distended  Extremities:  2+ dependent edema  Neurologic:  Lethargic  Skin:  Warm, dry           Basic Metabolic Panel:  Recent Labs  Lab 12/15/20 0539 12/16/20 0529 12/07/2020 2316 12/18/20 0007  12/18/20 1536 12/19/20 0326 12/19/20 1822 12/20/20 0549 12/20/20 1037 12/21/20 0545  NA 136  --    < > 130* 132* 133*  --  134*  --  133*  K 5.6* 5.4*   < > 6.2* 5.7* 5.4* 5.1 5.6* 4.6 4.4  CL 108  --    < > 105 104 104  --  107  --  105  CO2 19*  --    < > 20* 22 23  --  19*  --  19*  GLUCOSE 116*  --    < > 166* 125* 100*  --  85  --  167*  BUN 33*  --    < > 50* 55* 56*  --  58*  --  62*  CREATININE 1.93*  --    < > 2.35* 2.42* 2.40*  --  2.64*  --  2.78*  CALCIUM 8.9  --    < > 9.5 9.6 9.6  --  8.9  --  9.4  MG 2.1 2.2  --  2.2  --  2.3  --  2.2  --   --   PHOS 2.8 3.7  --  4.2  --  4.6  --  4.6  --   --    < > = values in this interval not displayed.     CBC: Recent Labs  Lab 12/10/2020 2316 12/18/20 0007 12/19/20 0326 12/20/20 3810 12/21/20 0545  WBC 14.0* 13.8* 12.8* 10.4 8.7  NEUTROABS 7.7  --   --   --   --   HGB 8.0* 7.6* 7.2* 7.9* 6.4*  HCT 24.3* 24.0* 23.1* 25.5* 21.9*  MCV 89.7 91.6 92.0 93.8 100.0  PLT 284 277 276 281 170      Lab Results  Component Value Date   HEPBSAG NON REACTIVE 06/13/2020   HEPBIGM NON REACTIVE 06/13/2020      Microbiology:  Recent Results (from the past 240 hour(s))  Urine culture     Status: Abnormal   Collection Time: 12/19/2020 11:07 PM   Specimen: Urine, Random  Result Value Ref Range Status   Specimen Description   Final    URINE, RANDOM Performed at [facility], [address] [place], [place] [address]    Special Requests   Final    NONE Performed at [facility], [address] [place], [place] [address]    Culture >=100,000 COLONIES/mL PSEUDOMONAS AERUGINOSA (A)  Final   Report Status 12/20/2020 FINAL  Final   Organism ID, Bacteria PSEUDOMONAS AERUGINOSA (A)  Final      Susceptibility   Pseudomonas aeruginosa - MIC*    CEFTAZIDIME 2 SENSITIVE Sensitive     CIPROFLOXACIN <=0.25 SENSITIVE Sensitive     GENTAMICIN <=1 SENSITIVE Sensitive     IMIPENEM 1 SENSITIVE Sensitive     PIP/TAZO  <=4 SENSITIVE Sensitive     CEFEPIME 2 SENSITIVE Sensitive     * >=100,000 COLONIES/mL PSEUDOMONAS AERUGINOSA  Resp Panel by RT-PCR (Flu A&B, Covid) Nasopharyngeal Swab     Status: None   Collection Time: 12/16/2020 11:15 PM   Specimen: Nasopharyngeal Swab; Nasopharyngeal(NP) swabs in vial transport medium  Result Value Ref Range Status   SARS Coronavirus 2 by RT PCR NEGATIVE NEGATIVE Final    Comment: (NOTE) SARS-CoV-2 target nucleic acids are NOT DETECTED.  The SARS-CoV-2 RNA is generally detectable in upper respiratory specimens during the acute phase of infection. The lowest concentration of SARS-CoV-2 viral copies this assay can detect is 138 copies/mL. A negative result does not preclude SARS-Cov-2 infection and should not be used as the sole basis for treatment or other patient management decisions. A negative result may occur with  improper specimen collection/handling, submission of specimen other than nasopharyngeal swab, presence of viral mutation(s) within the areas targeted by this assay, and inadequate number of viral copies(<138 copies/mL). A negative result must be combined with clinical observations, patient history, and epidemiological information. The expected result is Negative.  Fact Sheet for Patients:  BloggerCourse.comhttps://www.fda.gov/media/152166/download  Fact Sheet for Healthcare Providers:  SeriousBroker.ithttps://www.fda.gov/media/152162/download  This test is no t yet approved or cleared by the [place] FDA and  has been authorized for detection and/or diagnosis of SARS-CoV-2 by FDA under an Emergency Use Authorization (EUA). This EUA will remain  in effect (meaning this test can be used) for the duration of the COVID-19 declaration under Section 564(b)(1) of the Act, 21 U.S.C.section 360bbb-3(b)(1), unless the authorization is terminated  or revoked sooner.       Influenza A by PCR NEGATIVE NEGATIVE Final   Influenza B by PCR NEGATIVE NEGATIVE Final    Comment:  (NOTE) The Xpert Xpress SARS-CoV-2/FLU/RSV plus assay is intended as an aid in the diagnosis of influenza from Nasopharyngeal swab specimens and should not be used as a sole basis for treatment. Nasal washings and aspirates are unacceptable for Xpert Xpress SARS-CoV-2/FLU/RSV testing.  Fact Sheet for Patients: BloggerCourse.comhttps://www.fda.gov/media/152166/download  Fact Sheet for Healthcare Providers:  SeriousBroker.it  This test is not yet approved or cleared by the [place] and has been authorized for detection and/or diagnosis of SARS-CoV-2 by FDA under an Emergency Use Authorization (EUA). This EUA will remain in effect (meaning this test can be used) for the duration of the COVID-19 declaration under Section 564(b)(1) of the Act, 21 U.S.C. section 360bbb-3(b)(1), unless the authorization is terminated or revoked.  Performed at [facility], [address] [place], [place] [address]   Culture, blood (Routine x 2)     Status: None (Preliminary result)   Collection Time: 12/11/2020 11:27 PM   Specimen: BLOOD  Result Value Ref Range Status   Specimen Description BLOOD BLOOD LEFT HAND  Final   Special Requests   Final    BOTTLES DRAWN AEROBIC AND ANAEROBIC Blood Culture results may not be optimal due to an inadequate volume of blood received in culture bottles   Culture   Final    NO GROWTH 3 DAYS Performed at [facility], [address], [place], [place] [address]    Report Status PENDING  Incomplete  Culture, blood (Routine x 2)     Status: None (Preliminary result)   Collection Time: 12/18/20 12:06 AM   Specimen: BLOOD  Result Value Ref Range Status   Specimen Description BLOOD BLOOD LEFT FOREARM  Final   Special Requests   Final    BOTTLES DRAWN AEROBIC AND ANAEROBIC Blood Culture adequate volume   Culture   Final    NO GROWTH 3 DAYS Performed at [facility], [address], [place], [place] [address]    Report  Status PENDING  Incomplete  MRSA PCR Screening     Status: None   Collection Time: 12/18/20  8:52 AM   Specimen: Nasal Mucosa; Nasopharyngeal  Result Value Ref Range Status   MRSA by PCR NEGATIVE NEGATIVE Final    Comment:        The GeneXpert MRSA Assay (FDA approved for NASAL specimens only), is one component of a comprehensive MRSA colonization surveillance program. It is not intended to diagnose MRSA infection nor to guide or monitor treatment for MRSA infections. Performed at [facility], [address] [place], [place] [address]     Coagulation Studies: No results for input(s): LABPROT, INR in the last 72 hours.  Urinalysis: No results for input(s): COLORURINE, LABSPEC, PHURINE, GLUCOSEU, HGBUR, BILIRUBINUR, KETONESUR, PROTEINUR, UROBILINOGEN, NITRITE, LEUKOCYTESUR in the last 72 hours.  Invalid input(s): APPERANCEUR    Imaging: DG Chest Port 1 View  Result Date: 12/20/2020 CLINICAL DATA:  Shortness of breath EXAM: PORTABLE CHEST 1 VIEW COMPARISON:  12/18/2020, CT 06/21/2020, radiograph 12/10/2020, 12/13/2020 FINDINGS: Left IJ central venous catheter tip directed to the patient's right, in the general region of SVC origin but potentially directed against lateral wall of SVC. Bilateral pleural effusions and worsening airspace disease in the right greater than left chest. Obscured cardiomediastinal silhouette. No pneumothorax. IMPRESSION: Bilateral pleural effusions and slight worsening of airspace disease in the right greater than left chest. Left IJ central venous catheter tip directed to patient's right in the general region of SVC though question directed towards the lateral wall. Electronically Signed   By: Jasmine Pang M.D.   On: 12/20/2020 02:02     Medications:   . sodium chloride Stopped (12/20/20 1217)  . ceFEPime (MAXIPIME) IV    . feeding supplement (NEPRO CARB STEADY) 1,000 mL (12/21/20 1057)   . sodium chloride   Intravenous Once  . amiodarone  200 mg Per Tube Daily  . vitamin C  250 mg Per Tube BID  . chlorhexidine  15 mL Mouth Rinse BID  . Chlorhexidine Gluconate Cloth  6 each Topical Daily  . DULoxetine  30 mg Oral Daily  . feeding supplement (PROSource TF)  45 mL Per Tube BID  . free water  120 mL Per Tube Q4H  . heparin  5,000 Units Subcutaneous Q8H  . mouth rinse  15 mL Mouth Rinse q12n4p  . metoCLOPramide  5 mg Per Tube TID AC  . mirtazapine  45 mg Oral QHS  . pantoprazole (PROTONIX) IV  40 mg Intravenous QHS  . scopolamine  1 patch Transdermal Q72H   sodium chloride, albuterol, docusate sodium, polyethylene glycol  Assessment/ Plan:  65 y.o. female with diabetes mellitus type II, hypertension, osteoarthritis, hyperlipidemia, aortic atherosclerosis who was admitted to [facility] on 2020-12-25   Active Problems:   Pressure injury of skin   Altered mental status   Lower urinary tract infectious disease   Pneumonia due to infectious organism   #. AKI - Baseline Creatinine of 0.95 with normal GFR >60 on 12/10/13/2019.  Recent Labs    12/18/20 1536 12/19/20 0326 12/20/20 0549 12/21/20 0545  CREATININE 2.42* 2.40* 2.64* 2.78*   Pertinent studies: Imaging: CT abdomen without contrast on December 03, 2020.  No evidence of obstructive uropathy or nephrolithiasis. Urinalysis: 12-25-2020: Moderate hemoglobin, large leukocyte, protein 100, greater than 50 RBCs greater than 50 WBCs Serum creatinine is slowly worsening since early March.  Creatinine of 1.05 on December 07, 2020 Differential diagnosis includes interstitial nephritis vs prolonged ATN  Unfortunately, serum creatinine continues to worsen Urine output reported to be poor at 400 cc Agree with treating underlying infections and supportive care Agree with nonescalating care to aggressive measures.  #Generalized edema - Likely third spacing from low albumin Supportive care for now   #.  Hyperkalemia Potassium level of 6.3 at admission Treated with shifting  measures Agree with using Lokelma as needed Lab Results  Component Value Date   K 4.4 12/21/2020     #Urinary tract infection Urine culture from 12/25/2020 is growing greater than 100,000 Pseudomonas aeruginosa Currently being treated with cefepime      LOS: 3 Sahvannah Rieser 3/16/202212:36 PM  [facility] [place], [place] [phone]

## 2020-12-21 NOTE — Plan of Care (Signed)

## 2020-12-21 NOTE — Care Management Important Message (Signed)
Important Message  Patient Details  Name: Chelsea Glass MRN: [id number] Date of Birth: 1955/12/04   Medicare Important Message Given:  Yes     Johnell Comings 12/21/2020, 10:58 AM

## 2020-12-22 DIAGNOSIS — Z515 Encounter for palliative care: Secondary | ICD-10-CM | POA: Diagnosis not present

## 2020-12-22 DIAGNOSIS — Z7189 Other specified counseling: Secondary | ICD-10-CM | POA: Diagnosis not present

## 2020-12-22 LAB — TYPE AND SCREEN
ABO/RH(D): O POS
Antibody Screen: NEGATIVE
Unit division: 0

## 2020-12-22 LAB — BPAM RBC
Blood Product Expiration Date: 202204202359
ISSUE DATE / TIME: 202203161555
Unit Type and Rh: 5100

## 2020-12-22 LAB — GLUCOSE, CAPILLARY
Glucose-Capillary: 170 mg/dL — ABNORMAL HIGH (ref 70–99)
Glucose-Capillary: 174 mg/dL — ABNORMAL HIGH (ref 70–99)
Glucose-Capillary: 183 mg/dL — ABNORMAL HIGH (ref 70–99)

## 2020-12-22 MED ORDER — MORPHINE SULFATE (PF) 2 MG/ML IV SOLN: 2.0000 mg | INTRAVENOUS | Status: DC | PRN

## 2020-12-22 MED ORDER — LORAZEPAM 2 MG/ML IJ SOLN: 0.5000 mg | INTRAMUSCULAR | Status: DC | PRN

## 2020-12-23 LAB — CULTURE, BLOOD (ROUTINE X 2)
Culture: NO GROWTH
Culture: NO GROWTH
Special Requests: ADEQUATE

## 2021-01-06 NOTE — Progress Notes (Addendum)
Daily Progress Note   Patient Name: Chelsea Glass       Date: 01-06-2021 DOB: 01-01-1956  Age: 66 y.o. MRN#: [id number] Attending Physician: Edwin Dada, * Primary Care Physician: [place] Date: 12/16/2020  Reason for Consultation/Follow-up: Establishing goals of care  Subjective: Received a call from attending MD that patient is in the dying process and daughter has decided on comfort care. Discussed O2 use and symptom management. Discussed that patient appears comfortable.   In to see patient, family members are completing a prayer for her. Patient has eyes partially open, non responsive, with irregular gasping respirations; she is in the dying process. Daughter confirms plans for full comfort care and understands her mother could die at any time. Discussed that she appears to have no need for medication at this time. Discussed her O2 of 5 lpm, and the drying affect on her mucosa. This was decreased to 2 lpm for her comfort. Will monitor for further needs.    Questions answered and reflection on patient. Family to remain at bedside.      I completed a MOST form today with daughter and the signed original was placed in the chart. A photocopy was placed in the chart to be scanned into EMR. The patient outlined their wishes for the following treatment decisions:  Cardiopulmonary Resuscitation: Do Not Attempt Resuscitation (DNR/No CPR)  Medical Interventions: Comfort Measures: Keep clean, warm, and dry. Use medication by any route, positioning, wound care, and other measures to relieve pain and suffering. Use oxygen, suction and manual treatment of airway obstruction as needed for comfort. Do not transfer to the hospital unless comfort needs cannot be met in  current location.  Antibiotics: No antibiotics (use other measures to relieve symptoms)  IV Fluids: No IV fluids (provide other measures to ensure comfort)  Feeding Tube: No feeding tube   ADDENDUM: Was advised by staff and family member patient had just died. In to check on family and offer support. Staff at bedside- death confirmed by nurses.      Length of Stay: 4  Current Medications: Scheduled Meds:   vitamin C  250 mg Per Tube BID   chlorhexidine  15 mL Mouth Rinse BID   Chlorhexidine Gluconate Cloth  6 each Topical Daily   heparin  5,000 Units Subcutaneous Q8H  mouth rinse  15 mL Mouth Rinse q12n4p   scopolamine  1 patch Transdermal Q72H    Continuous Infusions:  sodium chloride Stopped (12/20/20 1217)    PRN Meds: sodium chloride, albuterol, docusate sodium, polyethylene glycol  Physical Exam Constitutional:      Comments: Eyes closed.   Pulmonary:     Comments: Gasping respirations            Vital Signs: BP (!) 58/40 (BP Location: Right Arm)    Pulse 62    Temp 97.7 F (36.5 C)    Resp 16    Wt 125.3 kg    SpO2 93%    BMI 45.97 kg/m  SpO2: SpO2: 93 % O2 Device: O2 Device: Nasal Cannula O2 Flow Rate: O2 Flow Rate (L/min): 4 L/min  Intake/output summary:   Intake/Output Summary (Last 24 hours) at 12/28/20 1143 Last data filed at 12-28-2020 0500 Gross per 24 hour  Intake 346 ml  Output 200 ml  Net 146 ml   LBM: Last BM Date: Dec 28, 2020 Baseline Weight: Weight: 124.3 kg Most recent weight: Weight: 125.3 kg         Patient Active Problem List   Diagnosis Date Noted   Lower urinary tract infectious disease    Pneumonia due to infectious organism    Altered mental status 12/18/2020   Pressure injury of skin 12/16/2020   Tachycardia    Weight loss    Adult failure to thrive    Depression    Acute kidney failure, unspecified ([place]) 12/03/2020   Postmenopausal bleeding    Hypertension    Acute respiratory failure with hypoxia  ([place]) 06/13/2020   Obesity, Class III, BMI 40-49.9 (morbid obesity) ([place]) 06/13/2020   Severe sepsis ([facility]) 06/13/2020   Atrial fibrillation ([place]) 06/13/2020   Pneumonia due to COVID-19 virus 06/12/2020    Palliative Care Assessment & Plan     Recommendations/Plan: Actively dying.     Code Status:    Code Status Orders  (From admission, onward)         Start     Ordered   12/20/20 1549  Do not attempt resuscitation (DNR)  Continuous       Question Answer Comment  In the event of cardiac or respiratory ARREST Do not call a code blue   In the event of cardiac or respiratory ARREST Do not perform Intubation, CPR, defibrillation or ACLS   In the event of cardiac or respiratory ARREST Use medication by any route, position, wound care, and other measures to relive pain and suffering. May use oxygen, suction and manual treatment of airway obstruction as needed for comfort.   Comments MOST form on chart.      12/20/20 1548        Code Status History    Date Active Date Inactive Code Status Order ID Comments User Context   12/18/2020 0040 12/20/2020 1548 Full Code [id number]  Rust-Chester, Huel Cote, NP ED   12/09/2020 1206 12/31/2020 0237 Full Code [id number]  Doristine Section Inpatient   12/03/2020 2042 12/09/2020 1206 Full Code [id number]  Sidney Ace Arvella Merles, MD Inpatient   06/12/2020 1804 06/27/2020 2002 Full Code [id number]  Jennye Boroughs, MD ED   Advance Care Planning Activity      Prognosis: Minutes to hours  Care plan was discussed with primary MD.   Thank you for allowing the Palliative Medicine Team to assist in the care of this patient.   Total Time 35 min Prolonged Time Billed no  Greater than 50%  of this time was spent counseling and coordinating care related to the above assessment and plan. ° °Baylor Teegarden, NP ° °Please contact Palliative Medicine Team phone at [phone] for questions and concerns.  ° ° ° ° °

## 2021-01-06 NOTE — Death Summary Note (Signed)
Expiration Note/ Death Summary  Chelsea Glass  MR#: [id number]  DOB:January 06, 1956  Date of Admission: Jan 04, 2021 Date of Death: 01-09-21 Time of Death: 1207PM  Attending Physician:Peniel Hass P Zoraya Fiorenza  Patient's PCP: [facility]  Consults: Treatment Team:  Lorain Childes, MD Mosetta Pigeon, MD    Narrative: [name] is a 65 y.o. F with HTN, Afib, Diabetes and obesity as well as COVID last Sep with declining health since then including recent hospitalization for encephalopathy due to failure to thrive, requiring PEG placement, who was brought to the hospital for several days again progressive somnolence and decreased mentation.  In the ER, K 6.3, Cr 2.3, LFTs elevated, ABG showeing hP 7.22 with PCo2 53 and CXR showing pneumonia.  Admitted to ICU and started on empiric antibiotics.       Diagnoses:  Sepsis due to aspiration pneumonia Acute metabolic encephalopathy Delirium  Hyperkalemia Resolved with treatment  Acute respiratory failure  Atrial fibrillation, paroxysmal  Anemia  Hyponatremia  Acute renal failure  Failure to thrive  Hypertension  Diabetes  Pineal gland mass   Morbid obesity   Pressure injury buttoicks, stage II POA Pressure injury L elbow, stage II POA     Hospital course: Patient was admitted and started on IV antibiotics, fluids and transferred to the ICU.  Given her worsening prognosis, it was determined that intubation for respiratory failure would cause excessive suffering and so patient was treated with oxygen, fluids and antibiotics.  Despite aggressive medical treatment, her condition worsened, she developed worsening renal failure and anemia requiring blood transfusion, which again, despite aggressive treatment did not prevent progression of her illness to end stage.  Family provided all comforts available and the patient was able to pass peacefully with daughter at  bedside.           Signed:  Alberteen Sam M.D. [facility] Jan 09, 2021, 12:42 PM

## 2021-01-06 NOTE — Plan of Care (Signed)
Pt not following commands and not responding when questions asked. Eyes remained closed throughout the entire shift. Pt daughter's at bedside, involved in her care. This RN confirmed with the daughters their wishes to keep their mother status to DNR and discussed at length the POC. This RN reminded about them about the availability of Chaplain services. They verbalized understanding.  Increased work of breathing when pt laid flat for a few seconds to turn or to be cleaned up. Had two large brown loose stools. Pt currently on 4L [place]. Yanker used this am to suction out secretions from back of pt throat. Head of bed to greater than 45 degrees.  Tubefeeding and water flushes continue infusing per Peg tube with no residuals recorded. Safety measures in place. Will continue to monitor.   Problem: Education: Goal: Knowledge of General Education information will improve Description: Including pain rating scale, medication(s)/side effects and non-pharmacologic comfort measures Outcome: Progressing   Problem: Health Behavior/Discharge Planning: Goal: Ability to manage health-related needs will improve Outcome: Progressing   Problem: Clinical Measurements: Goal: Ability to maintain clinical measurements within normal limits will improve Outcome: Progressing Goal: Will remain free from infection Outcome: Progressing Goal: Diagnostic test results will improve Outcome: Progressing Goal: Respiratory complications will improve Outcome: Progressing Goal: Cardiovascular complication will be avoided Outcome: Progressing   Problem: Activity: Goal: Risk for activity intolerance will decrease Outcome: Progressing   Problem: Nutrition: Goal: Adequate nutrition will be maintained Outcome: Progressing   Problem: Coping: Goal: Level of anxiety will decrease Outcome: Progressing   Problem: Elimination: Goal: Will not experience complications related to bowel motility Outcome: Progressing Goal: Will not  experience complications related to urinary retention Outcome: Progressing   Problem: Pain Managment: Goal: General experience of comfort will improve Outcome: Progressing   Problem: Safety: Goal: Ability to remain free from injury will improve Outcome: Progressing   Problem: Skin Integrity: Goal: Risk for impaired skin integrity will decrease Outcome: Progressing

## 2021-01-06 DEATH — deceased

## 2021-01-12 LAB — BLOOD GAS, ARTERIAL
Acid-base deficit: 5.8 mmol/L — ABNORMAL HIGH (ref 0.0–2.0)
Acid-base deficit: 7.9 mmol/L — ABNORMAL HIGH (ref 0.0–2.0)
Bicarbonate: 21.7 mmol/L (ref 20.0–28.0)
Bicarbonate: 20.6 mmol/L (ref 20.0–28.0)
FIO2: 1
FIO2: 0.4
O2 Saturation: 89.8 %
O2 Saturation: 98.3 %
Patient temperature: 37
Patient temperature: 37
pCO2 arterial: 53 mmHg — ABNORMAL HIGH (ref 32.0–48.0)
pCO2 arterial: 59 mmHg — ABNORMAL HIGH (ref 32.0–48.0)
pH, Arterial: 7.22 — ABNORMAL LOW (ref 7.350–7.450)
pH, Arterial: 7.15 — CL (ref 7.350–7.450)
pO2, Arterial: 70 mmHg — ABNORMAL LOW (ref 83.0–108.0)
pO2, Arterial: 137 mmHg — ABNORMAL HIGH (ref 83.0–108.0)
